# Patient Record
Sex: Female | Born: 1935 | Race: White | Hispanic: No | Smoking: Former smoker
Health system: Southern US, Community
[De-identification: ages and names within clinical notes are randomized; demographics above are authoritative.]

## PROBLEM LIST (undated history)

## (undated) ENCOUNTER — Emergency Department (HOSPITAL_COMMUNITY): Admission: EM | Payer: Medicare Other | Source: Home / Self Care

## (undated) DIAGNOSIS — Z9889 Other specified postprocedural states: Secondary | ICD-10-CM

## (undated) DIAGNOSIS — K760 Fatty (change of) liver, not elsewhere classified: Secondary | ICD-10-CM

## (undated) DIAGNOSIS — K449 Diaphragmatic hernia without obstruction or gangrene: Secondary | ICD-10-CM

## (undated) DIAGNOSIS — M1990 Unspecified osteoarthritis, unspecified site: Secondary | ICD-10-CM

## (undated) DIAGNOSIS — H3530 Unspecified macular degeneration: Secondary | ICD-10-CM

## (undated) DIAGNOSIS — K219 Gastro-esophageal reflux disease without esophagitis: Secondary | ICD-10-CM

## (undated) DIAGNOSIS — R600 Localized edema: Secondary | ICD-10-CM

## (undated) DIAGNOSIS — C801 Malignant (primary) neoplasm, unspecified: Secondary | ICD-10-CM

## (undated) DIAGNOSIS — R112 Nausea with vomiting, unspecified: Secondary | ICD-10-CM

## (undated) DIAGNOSIS — I10 Essential (primary) hypertension: Secondary | ICD-10-CM

## (undated) DIAGNOSIS — K5520 Angiodysplasia of colon without hemorrhage: Secondary | ICD-10-CM

## (undated) DIAGNOSIS — H269 Unspecified cataract: Secondary | ICD-10-CM

## (undated) DIAGNOSIS — I219 Acute myocardial infarction, unspecified: Secondary | ICD-10-CM

## (undated) DIAGNOSIS — K5792 Diverticulitis of intestine, part unspecified, without perforation or abscess without bleeding: Secondary | ICD-10-CM

## (undated) DIAGNOSIS — E559 Vitamin D deficiency, unspecified: Secondary | ICD-10-CM

## (undated) DIAGNOSIS — D509 Iron deficiency anemia, unspecified: Secondary | ICD-10-CM

## (undated) DIAGNOSIS — I8393 Asymptomatic varicose veins of bilateral lower extremities: Secondary | ICD-10-CM

## (undated) DIAGNOSIS — K5521 Angiodysplasia of colon with hemorrhage: Secondary | ICD-10-CM

## (undated) DIAGNOSIS — G459 Transient cerebral ischemic attack, unspecified: Secondary | ICD-10-CM

## (undated) DIAGNOSIS — I809 Phlebitis and thrombophlebitis of unspecified site: Secondary | ICD-10-CM

## (undated) DIAGNOSIS — Z9289 Personal history of other medical treatment: Secondary | ICD-10-CM

## (undated) HISTORY — DX: Iron deficiency anemia, unspecified: D50.9

## (undated) HISTORY — DX: Unspecified osteoarthritis, unspecified site: M19.90

## (undated) HISTORY — PX: CAROTID ARTERY - SUBCLAVIAN ARTERY BYPASS GRAFT: SUR178

## (undated) HISTORY — DX: Localized edema: R60.0

## (undated) HISTORY — PX: HEMORRHOID SURGERY: SHX153

## (undated) HISTORY — DX: Fatty (change of) liver, not elsewhere classified: K76.0

## (undated) HISTORY — DX: Angiodysplasia of colon without hemorrhage: K55.20

## (undated) HISTORY — PX: ABDOMINAL HYSTERECTOMY: SHX81

## (undated) HISTORY — DX: Vitamin D deficiency, unspecified: E55.9

---

## 1997-12-29 ENCOUNTER — Other Ambulatory Visit: Admission: RE | Admit: 1997-12-29 | Discharge: 1997-12-29 | Payer: Self-pay | Admitting: Hematology & Oncology

## 1998-01-27 ENCOUNTER — Other Ambulatory Visit: Admission: RE | Admit: 1998-01-27 | Discharge: 1998-01-27 | Payer: Self-pay | Admitting: Hematology & Oncology

## 1998-03-03 ENCOUNTER — Other Ambulatory Visit: Admission: RE | Admit: 1998-03-03 | Discharge: 1998-03-03 | Payer: Self-pay | Admitting: Hematology & Oncology

## 1998-03-16 ENCOUNTER — Other Ambulatory Visit: Admission: RE | Admit: 1998-03-16 | Discharge: 1998-03-16 | Payer: Self-pay | Admitting: Cardiology

## 1998-03-19 ENCOUNTER — Emergency Department (HOSPITAL_COMMUNITY): Admission: EM | Admit: 1998-03-19 | Discharge: 1998-03-19 | Payer: Self-pay | Admitting: Emergency Medicine

## 1998-04-03 ENCOUNTER — Other Ambulatory Visit: Admission: RE | Admit: 1998-04-03 | Discharge: 1998-04-03 | Payer: Self-pay | Admitting: Internal Medicine

## 1998-05-08 ENCOUNTER — Ambulatory Visit (HOSPITAL_COMMUNITY): Admission: RE | Admit: 1998-05-08 | Discharge: 1998-05-08 | Payer: Self-pay | Admitting: Internal Medicine

## 1998-05-08 ENCOUNTER — Encounter: Payer: Self-pay | Admitting: Internal Medicine

## 1998-08-15 ENCOUNTER — Ambulatory Visit (HOSPITAL_COMMUNITY): Admission: RE | Admit: 1998-08-15 | Discharge: 1998-08-15 | Payer: Self-pay | Admitting: Gastroenterology

## 1998-08-15 ENCOUNTER — Encounter: Payer: Self-pay | Admitting: Gastroenterology

## 1998-11-26 ENCOUNTER — Emergency Department (HOSPITAL_COMMUNITY): Admission: EM | Admit: 1998-11-26 | Discharge: 1998-11-26 | Payer: Self-pay | Admitting: Emergency Medicine

## 1999-01-29 ENCOUNTER — Other Ambulatory Visit: Admission: RE | Admit: 1999-01-29 | Discharge: 1999-01-29 | Payer: Self-pay | Admitting: *Deleted

## 1999-05-10 ENCOUNTER — Ambulatory Visit (HOSPITAL_COMMUNITY): Admission: RE | Admit: 1999-05-10 | Discharge: 1999-05-10 | Payer: Self-pay | Admitting: Internal Medicine

## 1999-05-10 ENCOUNTER — Encounter: Payer: Self-pay | Admitting: Internal Medicine

## 2000-01-30 ENCOUNTER — Other Ambulatory Visit: Admission: RE | Admit: 2000-01-30 | Discharge: 2000-01-30 | Payer: Self-pay | Admitting: *Deleted

## 2000-05-12 ENCOUNTER — Encounter: Payer: Self-pay | Admitting: Internal Medicine

## 2000-05-12 ENCOUNTER — Ambulatory Visit (HOSPITAL_COMMUNITY): Admission: RE | Admit: 2000-05-12 | Discharge: 2000-05-12 | Payer: Self-pay | Admitting: Internal Medicine

## 2001-01-29 ENCOUNTER — Encounter: Payer: Self-pay | Admitting: Gastroenterology

## 2001-01-29 ENCOUNTER — Ambulatory Visit (HOSPITAL_COMMUNITY): Admission: RE | Admit: 2001-01-29 | Discharge: 2001-01-29 | Payer: Self-pay | Admitting: Gastroenterology

## 2001-02-17 ENCOUNTER — Encounter: Payer: Self-pay | Admitting: Emergency Medicine

## 2001-02-17 ENCOUNTER — Emergency Department (HOSPITAL_COMMUNITY): Admission: EM | Admit: 2001-02-17 | Discharge: 2001-02-17 | Payer: Self-pay | Admitting: Emergency Medicine

## 2001-05-18 ENCOUNTER — Ambulatory Visit (HOSPITAL_COMMUNITY): Admission: RE | Admit: 2001-05-18 | Discharge: 2001-05-18 | Payer: Self-pay | Admitting: Internal Medicine

## 2001-05-18 ENCOUNTER — Encounter: Payer: Self-pay | Admitting: Internal Medicine

## 2001-08-17 ENCOUNTER — Ambulatory Visit (HOSPITAL_COMMUNITY): Admission: RE | Admit: 2001-08-17 | Discharge: 2001-08-17 | Payer: Self-pay | Admitting: Internal Medicine

## 2001-08-17 ENCOUNTER — Encounter: Payer: Self-pay | Admitting: Internal Medicine

## 2001-11-02 ENCOUNTER — Encounter: Admission: RE | Admit: 2001-11-02 | Discharge: 2001-11-02 | Payer: Self-pay | Admitting: Internal Medicine

## 2001-11-02 ENCOUNTER — Encounter: Payer: Self-pay | Admitting: Internal Medicine

## 2002-05-27 ENCOUNTER — Encounter: Admission: RE | Admit: 2002-05-27 | Discharge: 2002-05-27 | Payer: Self-pay | Admitting: Internal Medicine

## 2002-05-27 ENCOUNTER — Encounter: Payer: Self-pay | Admitting: Internal Medicine

## 2002-05-28 ENCOUNTER — Encounter: Admission: RE | Admit: 2002-05-28 | Discharge: 2002-05-28 | Payer: Self-pay | Admitting: Internal Medicine

## 2002-05-28 ENCOUNTER — Encounter: Payer: Self-pay | Admitting: Internal Medicine

## 2002-06-11 ENCOUNTER — Ambulatory Visit (HOSPITAL_COMMUNITY): Admission: RE | Admit: 2002-06-11 | Discharge: 2002-06-11 | Payer: Self-pay | Admitting: Gastroenterology

## 2002-06-11 ENCOUNTER — Encounter: Payer: Self-pay | Admitting: Gastroenterology

## 2002-06-30 ENCOUNTER — Encounter: Payer: Self-pay | Admitting: Emergency Medicine

## 2002-06-30 ENCOUNTER — Emergency Department (HOSPITAL_COMMUNITY): Admission: EM | Admit: 2002-06-30 | Discharge: 2002-06-30 | Payer: Self-pay | Admitting: Emergency Medicine

## 2002-10-24 ENCOUNTER — Emergency Department (HOSPITAL_COMMUNITY): Admission: EM | Admit: 2002-10-24 | Discharge: 2002-10-24 | Payer: Self-pay | Admitting: Emergency Medicine

## 2003-06-19 ENCOUNTER — Encounter: Payer: Self-pay | Admitting: Emergency Medicine

## 2003-06-19 ENCOUNTER — Encounter: Payer: Self-pay | Admitting: Internal Medicine

## 2003-06-19 ENCOUNTER — Inpatient Hospital Stay (HOSPITAL_COMMUNITY): Admission: EM | Admit: 2003-06-19 | Discharge: 2003-06-21 | Payer: Self-pay | Admitting: Emergency Medicine

## 2003-06-20 ENCOUNTER — Encounter: Payer: Self-pay | Admitting: Cardiology

## 2003-06-29 ENCOUNTER — Encounter: Payer: Self-pay | Admitting: Internal Medicine

## 2003-06-29 ENCOUNTER — Encounter: Admission: RE | Admit: 2003-06-29 | Discharge: 2003-06-29 | Payer: Self-pay | Admitting: Internal Medicine

## 2003-07-18 ENCOUNTER — Encounter: Payer: Self-pay | Admitting: Gastroenterology

## 2003-07-18 ENCOUNTER — Ambulatory Visit (HOSPITAL_COMMUNITY): Admission: RE | Admit: 2003-07-18 | Discharge: 2003-07-18 | Payer: Self-pay | Admitting: Gastroenterology

## 2003-09-25 ENCOUNTER — Emergency Department (HOSPITAL_COMMUNITY): Admission: AD | Admit: 2003-09-25 | Discharge: 2003-09-25 | Payer: Self-pay | Admitting: Family Medicine

## 2004-01-24 ENCOUNTER — Other Ambulatory Visit: Admission: RE | Admit: 2004-01-24 | Discharge: 2004-01-24 | Payer: Self-pay | Admitting: Obstetrics and Gynecology

## 2004-02-12 ENCOUNTER — Emergency Department (HOSPITAL_COMMUNITY): Admission: EM | Admit: 2004-02-12 | Discharge: 2004-02-12 | Payer: Self-pay | Admitting: Family Medicine

## 2004-07-04 ENCOUNTER — Encounter: Admission: RE | Admit: 2004-07-04 | Discharge: 2004-07-04 | Payer: Self-pay | Admitting: Internal Medicine

## 2004-07-13 ENCOUNTER — Encounter: Admission: RE | Admit: 2004-07-13 | Discharge: 2004-07-13 | Payer: Self-pay | Admitting: Internal Medicine

## 2004-08-19 ENCOUNTER — Inpatient Hospital Stay (HOSPITAL_COMMUNITY): Admission: EM | Admit: 2004-08-19 | Discharge: 2004-08-22 | Payer: Self-pay | Admitting: Emergency Medicine

## 2004-08-29 ENCOUNTER — Ambulatory Visit: Payer: Self-pay | Admitting: Hematology & Oncology

## 2004-11-19 ENCOUNTER — Emergency Department (HOSPITAL_COMMUNITY): Admission: EM | Admit: 2004-11-19 | Discharge: 2004-11-19 | Payer: Self-pay | Admitting: Family Medicine

## 2004-12-06 ENCOUNTER — Ambulatory Visit: Payer: Self-pay | Admitting: Hematology & Oncology

## 2004-12-31 ENCOUNTER — Ambulatory Visit (HOSPITAL_COMMUNITY): Admission: RE | Admit: 2004-12-31 | Discharge: 2004-12-31 | Payer: Self-pay | Admitting: Gastroenterology

## 2004-12-31 ENCOUNTER — Encounter (INDEPENDENT_AMBULATORY_CARE_PROVIDER_SITE_OTHER): Payer: Self-pay | Admitting: Specialist

## 2005-01-28 ENCOUNTER — Ambulatory Visit: Payer: Self-pay | Admitting: Hematology & Oncology

## 2005-04-03 ENCOUNTER — Ambulatory Visit: Payer: Self-pay | Admitting: Hematology & Oncology

## 2005-06-04 ENCOUNTER — Ambulatory Visit: Payer: Self-pay | Admitting: Hematology & Oncology

## 2005-07-15 ENCOUNTER — Encounter: Admission: RE | Admit: 2005-07-15 | Discharge: 2005-07-15 | Payer: Self-pay | Admitting: Internal Medicine

## 2005-07-16 ENCOUNTER — Emergency Department (HOSPITAL_COMMUNITY): Admission: EM | Admit: 2005-07-16 | Discharge: 2005-07-16 | Payer: Self-pay | Admitting: Family Medicine

## 2005-07-30 ENCOUNTER — Ambulatory Visit: Payer: Self-pay | Admitting: Hematology & Oncology

## 2005-10-07 ENCOUNTER — Inpatient Hospital Stay (HOSPITAL_BASED_OUTPATIENT_CLINIC_OR_DEPARTMENT_OTHER): Admission: RE | Admit: 2005-10-07 | Discharge: 2005-10-08 | Payer: Self-pay | Admitting: Cardiology

## 2005-10-29 ENCOUNTER — Ambulatory Visit: Payer: Self-pay | Admitting: Hematology & Oncology

## 2006-01-02 ENCOUNTER — Ambulatory Visit: Payer: Self-pay | Admitting: Hematology & Oncology

## 2006-01-03 LAB — CBC WITH DIFFERENTIAL/PLATELET
Basophils Absolute: 0 10*3/uL (ref 0.0–0.1)
EOS%: 1.8 % (ref 0.0–7.0)
HCT: 44.7 % (ref 34.8–46.6)
HGB: 15.3 g/dL (ref 11.6–15.9)
LYMPH%: 43 % (ref 14.0–48.0)
MCH: 30.3 pg (ref 26.0–34.0)
MCHC: 34.2 g/dL (ref 32.0–36.0)
NEUT%: 42.7 % (ref 39.6–76.8)
Platelets: 279 10*3/uL (ref 145–400)
lymph#: 3.7 10*3/uL — ABNORMAL HIGH (ref 0.9–3.3)

## 2006-01-27 ENCOUNTER — Ambulatory Visit (HOSPITAL_COMMUNITY): Admission: RE | Admit: 2006-01-27 | Discharge: 2006-01-27 | Payer: Self-pay | Admitting: Gastroenterology

## 2006-02-12 ENCOUNTER — Ambulatory Visit: Payer: Self-pay | Admitting: Hematology & Oncology

## 2006-05-09 ENCOUNTER — Ambulatory Visit: Payer: Self-pay | Admitting: Hematology & Oncology

## 2006-05-16 LAB — CBC WITH DIFFERENTIAL/PLATELET
BASO%: 0.4 % (ref 0.0–2.0)
HCT: 44.4 % (ref 34.8–46.6)
LYMPH%: 36.7 % (ref 14.0–48.0)
MCH: 30.4 pg (ref 26.0–34.0)
MCHC: 34.6 g/dL (ref 32.0–36.0)
MONO#: 0.8 10*3/uL (ref 0.1–0.9)
NEUT%: 50.2 % (ref 39.6–76.8)
Platelets: 291 10*3/uL (ref 145–400)
WBC: 7.4 10*3/uL (ref 3.9–10.0)

## 2006-07-16 ENCOUNTER — Encounter: Admission: RE | Admit: 2006-07-16 | Discharge: 2006-07-16 | Payer: Self-pay | Admitting: Internal Medicine

## 2006-08-20 ENCOUNTER — Ambulatory Visit: Payer: Self-pay | Admitting: Hematology & Oncology

## 2006-08-22 LAB — CBC & DIFF AND RETIC
BASO%: 0.4 % (ref 0.0–2.0)
Basophils Absolute: 0 10*3/uL (ref 0.0–0.1)
EOS%: 4.5 % (ref 0.0–7.0)
HCT: 46.3 % (ref 34.8–46.6)
HGB: 15.8 g/dL (ref 11.6–15.9)
IRF: 0.32 (ref 0.130–0.330)
LYMPH%: 35.3 % (ref 14.0–48.0)
MCH: 30.1 pg (ref 26.0–34.0)
MCHC: 34.1 g/dL (ref 32.0–36.0)
MCV: 88.3 fL (ref 81.0–101.0)
MONO%: 10.1 % (ref 0.0–13.0)
NEUT%: 49.7 % (ref 39.6–76.8)
Platelets: 306 10*3/uL (ref 145–400)
lymph#: 3.1 10*3/uL (ref 0.9–3.3)

## 2006-11-18 ENCOUNTER — Ambulatory Visit: Payer: Self-pay | Admitting: Vascular Surgery

## 2006-12-16 ENCOUNTER — Ambulatory Visit: Payer: Self-pay | Admitting: Hematology & Oncology

## 2006-12-19 LAB — CBC WITH DIFFERENTIAL/PLATELET
BASO%: 2.6 % — ABNORMAL HIGH (ref 0.0–2.0)
Basophils Absolute: 0.2 10*3/uL — ABNORMAL HIGH (ref 0.0–0.1)
EOS%: 2.6 % (ref 0.0–7.0)
Eosinophils Absolute: 0.2 10*3/uL (ref 0.0–0.5)
HCT: 41.9 % (ref 34.8–46.6)
HGB: 14.6 g/dL (ref 11.6–15.9)
LYMPH%: 32 % (ref 14.0–48.0)
MCH: 30.4 pg (ref 26.0–34.0)
MCHC: 34.7 g/dL (ref 32.0–36.0)
MCV: 87.4 fL (ref 81.0–101.0)
MONO#: 0.8 10*3/uL (ref 0.1–0.9)
MONO%: 11.5 % (ref 0.0–13.0)
NEUT#: 3.7 10*3/uL (ref 1.5–6.5)
NEUT%: 51.3 % (ref 39.6–76.8)
Platelets: 269 10*3/uL (ref 145–400)
RBC: 4.79 10*6/uL (ref 3.70–5.32)
RDW: 13.1 % (ref 11.3–14.5)
WBC: 7.2 10*3/uL (ref 3.9–10.0)
lymph#: 2.3 10*3/uL (ref 0.9–3.3)

## 2006-12-21 ENCOUNTER — Emergency Department (HOSPITAL_COMMUNITY): Admission: EM | Admit: 2006-12-21 | Discharge: 2006-12-21 | Payer: Self-pay | Admitting: Family Medicine

## 2006-12-28 ENCOUNTER — Emergency Department (HOSPITAL_COMMUNITY): Admission: EM | Admit: 2006-12-28 | Discharge: 2006-12-28 | Payer: Self-pay | Admitting: Family Medicine

## 2006-12-31 ENCOUNTER — Emergency Department (HOSPITAL_COMMUNITY): Admission: EM | Admit: 2006-12-31 | Discharge: 2006-12-31 | Payer: Self-pay | Admitting: Family Medicine

## 2007-01-02 ENCOUNTER — Emergency Department (HOSPITAL_COMMUNITY): Admission: EM | Admit: 2007-01-02 | Discharge: 2007-01-02 | Payer: Self-pay | Admitting: Family Medicine

## 2007-01-05 ENCOUNTER — Emergency Department (HOSPITAL_COMMUNITY): Admission: EM | Admit: 2007-01-05 | Discharge: 2007-01-05 | Payer: Self-pay | Admitting: Emergency Medicine

## 2007-01-12 ENCOUNTER — Emergency Department (HOSPITAL_COMMUNITY): Admission: EM | Admit: 2007-01-12 | Discharge: 2007-01-12 | Payer: Self-pay | Admitting: Emergency Medicine

## 2007-04-14 ENCOUNTER — Ambulatory Visit: Payer: Self-pay | Admitting: Hematology & Oncology

## 2007-04-17 LAB — CBC WITH DIFFERENTIAL/PLATELET
BASO%: 0.5 % (ref 0.0–2.0)
EOS%: 2.4 % (ref 0.0–7.0)
HCT: 44.3 % (ref 34.8–46.6)
LYMPH%: 38.6 % (ref 14.0–48.0)
MCH: 30.1 pg (ref 26.0–34.0)
MCHC: 34.8 g/dL (ref 32.0–36.0)
MONO%: 10.2 % (ref 0.0–13.0)
NEUT%: 48.3 % (ref 39.6–76.8)
Platelets: 283 10*3/uL (ref 145–400)
RBC: 5.12 10*6/uL (ref 3.70–5.32)

## 2007-06-08 ENCOUNTER — Emergency Department (HOSPITAL_COMMUNITY): Admission: EM | Admit: 2007-06-08 | Discharge: 2007-06-08 | Payer: Self-pay | Admitting: Family Medicine

## 2007-06-15 ENCOUNTER — Ambulatory Visit (HOSPITAL_COMMUNITY): Admission: RE | Admit: 2007-06-15 | Discharge: 2007-06-15 | Payer: Self-pay | Admitting: Gastroenterology

## 2007-07-20 ENCOUNTER — Encounter: Admission: RE | Admit: 2007-07-20 | Discharge: 2007-07-20 | Payer: Self-pay | Admitting: Internal Medicine

## 2007-08-07 ENCOUNTER — Ambulatory Visit: Payer: Self-pay | Admitting: Hematology & Oncology

## 2007-08-12 LAB — CBC WITH DIFFERENTIAL/PLATELET
Basophils Absolute: 0 10*3/uL (ref 0.0–0.1)
EOS%: 2.1 % (ref 0.0–7.0)
Eosinophils Absolute: 0.2 10*3/uL (ref 0.0–0.5)
HCT: 42.8 % (ref 34.8–46.6)
HGB: 14.9 g/dL (ref 11.6–15.9)
MCH: 31 pg (ref 26.0–34.0)
MONO#: 0.9 10*3/uL (ref 0.1–0.9)
NEUT#: 4.3 10*3/uL (ref 1.5–6.5)
NEUT%: 51.8 % (ref 39.6–76.8)
RDW: 12.8 % (ref 11.3–14.5)
lymph#: 2.9 10*3/uL (ref 0.9–3.3)

## 2007-08-12 LAB — FERRITIN: Ferritin: 33 ng/mL (ref 10–291)

## 2007-08-28 ENCOUNTER — Ambulatory Visit (HOSPITAL_COMMUNITY): Admission: RE | Admit: 2007-08-28 | Discharge: 2007-08-28 | Payer: Self-pay | Admitting: Gastroenterology

## 2007-11-25 ENCOUNTER — Ambulatory Visit: Payer: Self-pay | Admitting: Hematology & Oncology

## 2007-11-27 LAB — CBC & DIFF AND RETIC
BASO%: 0.5 % (ref 0.0–2.0)
EOS%: 1.9 % (ref 0.0–7.0)
HCT: 39.9 % (ref 34.8–46.6)
LYMPH%: 35.2 % (ref 14.0–48.0)
MCH: 31 pg (ref 26.0–34.0)
MCHC: 35.2 g/dL (ref 32.0–36.0)
MONO#: 0.6 10*3/uL (ref 0.1–0.9)
MONO%: 8.8 % (ref 0.0–13.0)
NEUT%: 53.6 % (ref 39.6–76.8)
Platelets: 262 10*3/uL (ref 145–400)
RBC: 4.52 10*6/uL (ref 3.70–5.32)
Retic %: 1.5 % (ref 0.4–2.3)
WBC: 7.4 10*3/uL (ref 3.9–10.0)

## 2008-02-21 ENCOUNTER — Emergency Department (HOSPITAL_COMMUNITY): Admission: EM | Admit: 2008-02-21 | Discharge: 2008-02-21 | Payer: Self-pay | Admitting: Family Medicine

## 2008-03-29 ENCOUNTER — Ambulatory Visit: Payer: Self-pay | Admitting: Vascular Surgery

## 2008-06-03 ENCOUNTER — Ambulatory Visit: Payer: Self-pay | Admitting: Hematology & Oncology

## 2008-06-06 LAB — RETICULOCYTES (CHCC)
RBC.: 5.07 MIL/uL (ref 3.87–5.11)
Retic Ct Pct: 0.7 % (ref 0.4–3.1)

## 2008-06-06 LAB — CBC WITH DIFFERENTIAL (CANCER CENTER ONLY)
BASO#: 0.1 10*3/uL (ref 0.0–0.2)
Eosinophils Absolute: 0.2 10*3/uL (ref 0.0–0.5)
HCT: 43.6 % (ref 34.8–46.6)
HGB: 15 g/dL (ref 11.6–15.9)
LYMPH%: 41.1 % (ref 14.0–48.0)
MCH: 29.9 pg (ref 26.0–34.0)
MCV: 87 fL (ref 81–101)
MONO#: 0.6 10*3/uL (ref 0.1–0.9)
MONO%: 8.8 % (ref 0.0–13.0)
RBC: 5.04 10*6/uL (ref 3.70–5.32)
WBC: 7.2 10*3/uL (ref 3.9–10.0)

## 2008-06-06 LAB — FERRITIN: Ferritin: 41 ng/mL (ref 10–291)

## 2008-07-21 ENCOUNTER — Encounter: Admission: RE | Admit: 2008-07-21 | Discharge: 2008-07-21 | Payer: Self-pay | Admitting: Internal Medicine

## 2008-07-23 ENCOUNTER — Emergency Department (HOSPITAL_COMMUNITY): Admission: EM | Admit: 2008-07-23 | Discharge: 2008-07-23 | Payer: Self-pay | Admitting: Family Medicine

## 2008-08-22 ENCOUNTER — Ambulatory Visit (HOSPITAL_COMMUNITY): Admission: RE | Admit: 2008-08-22 | Discharge: 2008-08-22 | Payer: Self-pay | Admitting: Gastroenterology

## 2008-12-09 ENCOUNTER — Ambulatory Visit: Payer: Self-pay | Admitting: Hematology & Oncology

## 2008-12-12 LAB — CBC WITH DIFFERENTIAL (CANCER CENTER ONLY)
BASO#: 0 10*3/uL (ref 0.0–0.2)
Eosinophils Absolute: 0.2 10*3/uL (ref 0.0–0.5)
HGB: 15.4 g/dL (ref 11.6–15.9)
LYMPH#: 2.2 10*3/uL (ref 0.9–3.3)
MCH: 30.5 pg (ref 26.0–34.0)
MONO#: 0.4 10*3/uL (ref 0.1–0.9)
NEUT#: 3.3 10*3/uL (ref 1.5–6.5)
Platelets: 237 10*3/uL (ref 145–400)
RBC: 5.05 10*6/uL (ref 3.70–5.32)
WBC: 6.1 10*3/uL (ref 3.9–10.0)

## 2008-12-12 LAB — FERRITIN: Ferritin: 77 ng/mL (ref 10–291)

## 2008-12-12 LAB — CHCC SATELLITE - SMEAR

## 2008-12-12 LAB — RETICULOCYTES (CHCC): ABS Retic: 44.5 10*3/uL (ref 19.0–186.0)

## 2009-03-15 ENCOUNTER — Encounter: Admission: RE | Admit: 2009-03-15 | Discharge: 2009-03-15 | Payer: Self-pay | Admitting: Gastroenterology

## 2009-06-23 HISTORY — PX: COLONOSCOPY: SHX174

## 2009-07-07 ENCOUNTER — Ambulatory Visit: Payer: Self-pay | Admitting: Hematology & Oncology

## 2009-07-10 LAB — CBC WITH DIFFERENTIAL (CANCER CENTER ONLY)
BASO#: 0.1 10*3/uL (ref 0.0–0.2)
BASO%: 0.7 % (ref 0.0–2.0)
EOS%: 2.4 % (ref 0.0–7.0)
HCT: 35.7 % (ref 34.8–46.6)
HGB: 12 g/dL (ref 11.6–15.9)
LYMPH#: 2.8 10*3/uL (ref 0.9–3.3)
LYMPH%: 39.2 % (ref 14.0–48.0)
MCHC: 33.5 g/dL (ref 32.0–36.0)
MCV: 88 fL (ref 81–101)
NEUT%: 48.9 % (ref 39.6–80.0)
RDW: 11.1 % (ref 10.5–14.6)

## 2009-07-10 LAB — CHCC SATELLITE - SMEAR

## 2009-07-10 LAB — RETICULOCYTES (CHCC)
RBC.: 4.11 MIL/uL (ref 3.87–5.11)
Retic Ct Pct: 1.1 % (ref 0.4–3.1)

## 2009-07-10 LAB — FERRITIN: Ferritin: 14 ng/mL (ref 10–291)

## 2009-07-24 ENCOUNTER — Encounter: Admission: RE | Admit: 2009-07-24 | Discharge: 2009-07-24 | Payer: Self-pay | Admitting: Internal Medicine

## 2009-08-10 ENCOUNTER — Ambulatory Visit: Payer: Self-pay | Admitting: Hematology & Oncology

## 2009-08-14 LAB — CBC WITH DIFFERENTIAL (CANCER CENTER ONLY)
BASO#: 0 10*3/uL (ref 0.0–0.2)
BASO%: 0.6 % (ref 0.0–2.0)
Eosinophils Absolute: 0.2 10*3/uL (ref 0.0–0.5)
HCT: 42.4 % (ref 34.8–46.6)
HGB: 14.5 g/dL (ref 11.6–15.9)
LYMPH#: 2.1 10*3/uL (ref 0.9–3.3)
LYMPH%: 35.1 % (ref 14.0–48.0)
MCV: 88 fL (ref 81–101)
MONO#: 0.4 10*3/uL (ref 0.1–0.9)
NEUT%: 54.5 % (ref 39.6–80.0)
RBC: 4.81 10*6/uL (ref 3.70–5.32)
WBC: 6.1 10*3/uL (ref 3.9–10.0)

## 2009-08-14 LAB — RETICULOCYTES (CHCC)
ABS Retic: 19.6 10*3/uL (ref 19.0–186.0)
RBC.: 4.9 MIL/uL (ref 3.87–5.11)
Retic Ct Pct: 0.4 % (ref 0.4–3.1)

## 2009-08-14 LAB — FERRITIN: Ferritin: 271 ng/mL (ref 10–291)

## 2009-10-06 ENCOUNTER — Ambulatory Visit: Payer: Self-pay | Admitting: Hematology & Oncology

## 2009-10-09 LAB — CBC WITH DIFFERENTIAL (CANCER CENTER ONLY)
BASO#: 0.1 10*3/uL (ref 0.0–0.2)
BASO%: 0.7 % (ref 0.0–2.0)
EOS%: 2.9 % (ref 0.0–7.0)
Eosinophils Absolute: 0.2 10*3/uL (ref 0.0–0.5)
HCT: 46.6 % (ref 34.8–46.6)
HGB: 15.6 g/dL (ref 11.6–15.9)
LYMPH#: 2.9 10*3/uL (ref 0.9–3.3)
LYMPH%: 40.8 % (ref 14.0–48.0)
MCH: 30.1 pg (ref 26.0–34.0)
MCHC: 33.6 g/dL (ref 32.0–36.0)
MCV: 90 fL (ref 81–101)
MONO#: 0.6 10*3/uL (ref 0.1–0.9)
MONO%: 7.7 % (ref 0.0–13.0)
NEUT#: 3.4 10*3/uL (ref 1.5–6.5)
NEUT%: 47.9 % (ref 39.6–80.0)
Platelets: 211 10*3/uL (ref 145–400)
RBC: 5.19 10*6/uL (ref 3.70–5.32)
RDW: 12.9 % (ref 10.5–14.6)
WBC: 7.1 10*3/uL (ref 3.9–10.0)

## 2009-10-09 LAB — FERRITIN: Ferritin: 135 ng/mL (ref 10–291)

## 2009-10-09 LAB — RETICULOCYTES (CHCC)
ABS Retic: 25.5 10*3/uL (ref 19.0–186.0)
RBC.: 5.1 MIL/uL (ref 3.87–5.11)
Retic Ct Pct: 0.5 % (ref 0.4–3.1)

## 2010-01-18 ENCOUNTER — Ambulatory Visit: Payer: Self-pay | Admitting: Hematology & Oncology

## 2010-01-22 LAB — CBC WITH DIFFERENTIAL (CANCER CENTER ONLY)
BASO#: 0.1 10*3/uL (ref 0.0–0.2)
BASO%: 1 % (ref 0.0–2.0)
EOS%: 2.2 % (ref 0.0–7.0)
HGB: 15.2 g/dL (ref 11.6–15.9)
LYMPH#: 2.9 10*3/uL (ref 0.9–3.3)
MCHC: 33.4 g/dL (ref 32.0–36.0)
NEUT#: 3.3 10*3/uL (ref 1.5–6.5)
Platelets: 199 10*3/uL (ref 145–400)
RDW: 10.2 % — ABNORMAL LOW (ref 10.5–14.6)

## 2010-01-22 LAB — FERRITIN: Ferritin: 51 ng/mL (ref 10–291)

## 2010-06-01 ENCOUNTER — Ambulatory Visit: Payer: Self-pay | Admitting: Hematology & Oncology

## 2010-06-04 LAB — CBC WITH DIFFERENTIAL (CANCER CENTER ONLY)
Eosinophils Absolute: 0.2 10*3/uL (ref 0.0–0.5)
HCT: 45 % (ref 34.8–46.6)
LYMPH%: 40.2 % (ref 14.0–48.0)
MCV: 93 fL (ref 81–101)
MONO#: 0.7 10*3/uL (ref 0.1–0.9)
Platelets: 175 10*3/uL (ref 145–400)
RBC: 4.82 10*6/uL (ref 3.70–5.32)
WBC: 6.7 10*3/uL (ref 3.9–10.0)

## 2010-06-04 LAB — RETICULOCYTES (CHCC)
ABS Retic: 38.4 10*3/uL (ref 19.0–186.0)
RBC.: 4.8 MIL/uL (ref 3.87–5.11)

## 2010-06-04 LAB — CHCC SATELLITE - SMEAR

## 2010-06-04 LAB — FERRITIN: Ferritin: 141 ng/mL (ref 10–291)

## 2010-08-01 ENCOUNTER — Encounter: Admission: RE | Admit: 2010-08-01 | Discharge: 2010-08-01 | Payer: Self-pay | Admitting: Internal Medicine

## 2010-09-28 ENCOUNTER — Ambulatory Visit: Payer: Self-pay | Admitting: Hematology & Oncology

## 2010-10-01 LAB — CBC WITH DIFFERENTIAL (CANCER CENTER ONLY)
BASO#: 0.1 10*3/uL (ref 0.0–0.2)
BASO%: 1.4 % (ref 0.0–2.0)
EOS%: 2.9 % (ref 0.0–7.0)
Eosinophils Absolute: 0.2 10*3/uL (ref 0.0–0.5)
HCT: 46 % (ref 34.8–46.6)
HGB: 15.6 g/dL (ref 11.6–15.9)
LYMPH#: 2.8 10*3/uL (ref 0.9–3.3)
LYMPH%: 39 % (ref 14.0–48.0)
MCH: 31.3 pg (ref 26.0–34.0)
MCHC: 33.8 g/dL (ref 32.0–36.0)
MCV: 92 fL (ref 81–101)
MONO#: 0.6 10*3/uL (ref 0.1–0.9)
MONO%: 9 % (ref 0.0–13.0)
NEUT#: 3.4 10*3/uL (ref 1.5–6.5)
NEUT%: 47.7 % (ref 39.6–80.0)
Platelets: 209 10*3/uL (ref 145–400)
RBC: 4.98 10*6/uL (ref 3.70–5.32)
RDW: 11 % (ref 10.5–14.6)
WBC: 7.1 10*3/uL (ref 3.9–10.0)

## 2010-10-01 LAB — CHCC SATELLITE - SMEAR

## 2010-10-01 LAB — FERRITIN: Ferritin: 141 ng/mL (ref 10–291)

## 2010-11-08 ENCOUNTER — Emergency Department (HOSPITAL_COMMUNITY)
Admission: EM | Admit: 2010-11-08 | Discharge: 2010-11-08 | Disposition: A | Discharge status: Home / Self Care | Payer: Medicare Other | Attending: Emergency Medicine | Admitting: Emergency Medicine

## 2010-11-08 ENCOUNTER — Emergency Department (HOSPITAL_COMMUNITY): Payer: Medicare Other

## 2010-11-08 DIAGNOSIS — M545 Low back pain, unspecified: Secondary | ICD-10-CM | POA: Insufficient documentation

## 2010-11-08 DIAGNOSIS — I252 Old myocardial infarction: Secondary | ICD-10-CM | POA: Insufficient documentation

## 2010-11-08 DIAGNOSIS — R209 Unspecified disturbances of skin sensation: Secondary | ICD-10-CM | POA: Insufficient documentation

## 2010-11-08 DIAGNOSIS — R5381 Other malaise: Secondary | ICD-10-CM | POA: Insufficient documentation

## 2010-11-08 DIAGNOSIS — Z8673 Personal history of transient ischemic attack (TIA), and cerebral infarction without residual deficits: Secondary | ICD-10-CM | POA: Insufficient documentation

## 2010-11-08 DIAGNOSIS — M549 Dorsalgia, unspecified: Secondary | ICD-10-CM | POA: Insufficient documentation

## 2010-11-08 DIAGNOSIS — I10 Essential (primary) hypertension: Secondary | ICD-10-CM | POA: Insufficient documentation

## 2010-11-08 LAB — DIFFERENTIAL
Basophils Absolute: 0 10*3/uL (ref 0.0–0.1)
Basophils Relative: 0 % (ref 0–1)
Eosinophils Absolute: 0 10*3/uL (ref 0.0–0.7)
Eosinophils Relative: 0 % (ref 0–5)
Lymphocytes Relative: 21 % (ref 12–46)
Lymphs Abs: 1.4 10*3/uL (ref 0.7–4.0)
Monocytes Absolute: 0.3 10*3/uL (ref 0.1–1.0)
Monocytes Relative: 5 % (ref 3–12)
Neutro Abs: 5.1 10*3/uL (ref 1.7–7.7)
Neutrophils Relative %: 74 % (ref 43–77)

## 2010-11-08 LAB — COMPREHENSIVE METABOLIC PANEL
ALT: 64 U/L — ABNORMAL HIGH (ref 0–35)
AST: 56 U/L — ABNORMAL HIGH (ref 0–37)
Albumin: 3.9 g/dL (ref 3.5–5.2)
Alkaline Phosphatase: 120 U/L — ABNORMAL HIGH (ref 39–117)
BUN: 11 mg/dL (ref 6–23)
CO2: 23 mEq/L (ref 19–32)
Calcium: 10 mg/dL (ref 8.4–10.5)
Chloride: 109 mEq/L (ref 96–112)
Creatinine, Ser: 0.77 mg/dL (ref 0.4–1.2)
GFR calc Af Amer: >60 mL/min (ref >60)
GFR calc non Af Amer: >60 mL/min (ref >60)
Glucose, Bld: 144 mg/dL — ABNORMAL HIGH (ref 70–99)
Potassium: 4.3 mEq/L (ref 3.5–5.1)
Sodium: 139 mEq/L (ref 135–145)
Total Bilirubin: 1 mg/dL (ref 0.3–1.2)
Total Protein: 7.8 g/dL (ref 6.0–8.3)

## 2010-11-08 LAB — URINALYSIS, ROUTINE W REFLEX MICROSCOPIC
Hgb urine dipstick: NEGATIVE
Ketones, ur: NEGATIVE mg/dL
Protein, ur: NEGATIVE mg/dL
Urine Glucose, Fasting: NEGATIVE mg/dL

## 2010-11-08 LAB — CBC
HCT: 46.8 % — ABNORMAL HIGH (ref 36.0–46.0)
Hemoglobin: 15.9 g/dL — ABNORMAL HIGH (ref 12.0–15.0)
MCH: 31.3 pg (ref 26.0–34.0)
MCHC: 34 g/dL (ref 30.0–36.0)
MCV: 92.1 fL (ref 78.0–100.0)
Platelets: 197 10*3/uL (ref 150–400)
RBC: 5.08 MIL/uL (ref 3.87–5.11)
RDW: 12.5 % (ref 11.5–15.5)
WBC: 6.9 10*3/uL (ref 4.0–10.5)

## 2010-11-08 LAB — URINE MICROSCOPIC-ADD ON

## 2010-11-08 LAB — CK: Total CK: 83 U/L (ref 7–177)

## 2010-11-08 LAB — SEDIMENTATION RATE: Sed Rate: 1 mm/hr (ref 0–22)

## 2010-12-07 ENCOUNTER — Inpatient Hospital Stay (INDEPENDENT_AMBULATORY_CARE_PROVIDER_SITE_OTHER)
Admission: RE | Admit: 2010-12-07 | Discharge: 2010-12-07 | Disposition: A | Discharge status: Home / Self Care | Payer: Medicare Other | Source: Ambulatory Visit | Attending: Family Medicine | Admitting: Family Medicine

## 2010-12-07 DIAGNOSIS — L02619 Cutaneous abscess of unspecified foot: Secondary | ICD-10-CM

## 2011-01-28 ENCOUNTER — Other Ambulatory Visit: Payer: Self-pay | Admitting: Hematology & Oncology

## 2011-01-28 ENCOUNTER — Encounter (HOSPITAL_BASED_OUTPATIENT_CLINIC_OR_DEPARTMENT_OTHER): Payer: Medicare Other | Admitting: Hematology & Oncology

## 2011-01-28 DIAGNOSIS — K5520 Angiodysplasia of colon without hemorrhage: Secondary | ICD-10-CM

## 2011-01-28 DIAGNOSIS — D509 Iron deficiency anemia, unspecified: Secondary | ICD-10-CM

## 2011-01-28 LAB — CBC WITH DIFFERENTIAL (CANCER CENTER ONLY)
BASO#: 0 10e3/uL (ref 0.0–0.2)
BASO%: 0.1 % (ref 0.0–2.0)
EOS%: 1.9 % (ref 0.0–7.0)
Eosinophils Absolute: 0.2 10e3/uL (ref 0.0–0.5)
HCT: 42.5 % (ref 34.8–46.6)
HGB: 14.9 g/dL (ref 11.6–15.9)
LYMPH#: 3 10e3/uL (ref 0.9–3.3)
LYMPH%: 34 % (ref 14.0–48.0)
MCH: 30.9 pg (ref 26.0–34.0)
MCHC: 35.1 g/dL (ref 32.0–36.0)
MCV: 88 fL (ref 81–101)
MONO#: 1.1 10e3/uL — ABNORMAL HIGH (ref 0.1–0.9)
MONO%: 12.5 % (ref 0.0–13.0)
NEUT#: 4.6 10e3/uL (ref 1.5–6.5)
NEUT%: 51.5 % (ref 39.6–80.0)
Platelets: 211 10e3/uL (ref 145–400)
RBC: 4.82 10e6/uL (ref 3.70–5.32)
RDW: 12.4 % (ref 11.1–15.7)
WBC: 9 10e3/uL (ref 3.9–10.0)

## 2011-01-28 LAB — RETICULOCYTES (CHCC)
RBC.: 4.85 MIL/uL (ref 3.87–5.11)
Retic Ct Pct: 0.8 % (ref 0.4–3.1)

## 2011-01-28 LAB — FERRITIN: Ferritin: 126 ng/mL (ref 10–291)

## 2011-01-28 LAB — TECHNOLOGIST REVIEW CHCC SATELLITE

## 2011-01-28 LAB — CHCC SATELLITE - SMEAR

## 2011-02-05 NOTE — Assessment & Plan Note (Signed)
OFFICE VISIT   [name], [name]  DOB:  May 20, 1928                                       03/29/2008  ZOXWR#:[id number]   The patient has experienced some numbness on the sole of the left foot.  She underwent laser ablation of her right short saphenous vein in  January of 2008, of her left great saphenous vein in September of 2007  for venous insufficiency.  She has done well from that standpoint and  only has noticed the numbness in the left foot over the last week or so.  She has had no trauma to the foot and does not notice any numbness in  the right foot.  She does not describe claudication type symptoms and is  not having any cardiac symptoms.  She does have diabetes mellitus.   PHYSICAL EXAMINATION:  Vital signs:  Blood pressure 160/81, heart rate  65, respirations 14.  Abdomen:  Soft, nontender with no masses.  She has  3+ femoral, popliteal and posterior tibial pulses palpable bilaterally.  There are some scattered varicosities in both legs which are not tense.  There is chronic hyperpigmentation from her chronic venous  insufficiency.  No ulcerations or ischemia is noted.   I suspect that she does have some peripheral neuropathy related to her  diabetes but no evidence of arterial insufficiency that is significant  at this time and I have reassured her regarding these findings.   Quita Skye Hart [place], M.D.  Electronically Signed   JDL/MEDQ  D:  03/29/2008  T:  03/30/2008  Job:  [id number]

## 2011-02-05 NOTE — Op Note (Signed)
[name], TSCHIDA                  ACCOUNT NO.:  [id number]   MEDICAL RECORD NO.:  [id number]          PATIENT TYPE:  AMB   LOCATION:  ENDO                         FACILITY:  [facility]   PHYSICIAN:  [name], M.D.    DATE OF BIRTH:  08-Jan-1928   DATE OF PROCEDURE:  08/22/2008  DATE OF DISCHARGE:                               OPERATIVE REPORT   PROCEDURE:  Esophagogastroduodenoscopy with Savary dilatation.   INDICATION:  The patient with long history of hiatal hernia and  stricture helped by dilatation in the past.  Consent was signed after  risks, benefits, methods, and options thoroughly discussed multiple  times in the past.   MEDICINES USED:  1. Fentanyl 75 mcg.  2. Versed 7.5 mg.   PROCEDURE:  The videoendoscope was inserted by direct vision.  Proximal  midesophagus was normal at 30 cm.  A fibrous ring was seen.  With very  gentle pressure, minimal resistance, we were able to advance into the  large hiatal hernia pouch and into the stomach and advanced through the  antrum pertinent for some minimal linear enteritis through a normal  pylorus into a normal duodenal bulb and around the C-loop to normal  second portion of the duodenum.  Scope was slowly withdrawn back to the  bulb again confirming its normal appearance.  Scope was withdrawn back  to the stomach and retroflexed.  High in the cardia, the hiatal hernia  was confirmed.  She did have some Cameron lesions, which had some linear  erosions in the bottom of the hiatal hernia pouch.  No other abnormality  was seen on the retroflexion.  Straight visualization of the stomach did  not reveal any additional findings.  We went ahead and slowly withdrew  back to about 16 cm again confirming the above findings.  The patient  had significant bleeding at GE junction.  The scope was then advanced to  the antrum and under fluoro guidance, a Savary wire was advanced.  The  customary J-loop was confirmed under fluoro and  endoscopically.  The  scope was  removed making sure to keep the wire in the proper position.  We went ahead and advanced the 12.8 and the 15-mm dilators.  Both were  advanced into the stomach without resistance.  There was no heme on  either dilator.  Unfortunately, as we were withdrawing the 15, the wire  withdrew back into the esophagus.  We went ahead and reendoscoped the  patient.  There was no significant trauma or active bleeding at the  hiatal hernia and the stricture.  The scope was reinserted into the  antrum, and the wire was readvanced under fluoro guidance.  The  customary J-loop was confirmed.  The scope was removed to make sure to  keep the wire in the proper position.  We went ahead and proceeded with  a 17-mm dilator, which passed into the stomach without resistance.  The  wire was withdrawn back into the dilator, both were removed in tandem.  There was just a tiny bit of heme on this dilator.  The  procedure was  terminated at this juncture.  The patient tolerated the procedure well.  There was no obvious immediate complication.   ENDOSCOPIC DIAGNOSES:  1. Large hiatal hernia with proximal fibrous ring.  2. Minimal resistance with passing the scope.  3. Minimal enteritis.  4. 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  5. Otherwise normal esophagogastroduodenoscopy, therapy Savary      dilation under fluoro to 17 mm.   PLAN:  See how the dilation works.  If she believes the balloons work  better, we can do that next time.  I will see her back p.r.n. or in 6  weeks.  Double omeprazole for 1 week.  Slowly advance diet.  No aspirin,  nonsteroidal for 3 days.           ______________________________  [name], M.D.     MEM/MEDQ  D:  08/22/2008  T:  08/22/2008  Job:  [id number]   cc:   Peter M. [place], M.D.  Lucky Cowboy, M.D.

## 2011-02-05 NOTE — Op Note (Signed)
[name], [name]                  ACCOUNT NO.:  [id number]   MEDICAL RECORD NO.:  [id number]          PATIENT TYPE:  AMB   LOCATION:  ENDO                         FACILITY:  [facility]   PHYSICIAN:  [name], M.D.    DATE OF BIRTH:  07/04/1928   DATE OF PROCEDURE:  06/15/2007  DATE OF DISCHARGE:                               OPERATIVE REPORT   PROCEDURE:  EGD with Savary dilatation.   ENDOSCOPIST:  [name], M.D.   INDICATIONS FOR PROCEDURE:  Patient with large hiatal hernia, recurrent  ring helped by dilation in the past.  Consent was signed after risks,  benefits, methods, and options were thoroughly discussed in the office  on multiple occasions.   MEDICATIONS:  Fentanyl 70 mcg, Versed 7.5 mg.   DESCRIPTION OF PROCEDURE:  The video endoscope was inserted by direct  vision.  The proximal and mid esophagus were normal.  In the distal  esophagus there was a large hiatal hernia and a moderately tight fibrous  ring. There was some trauma and minimal resistance in advancing the  scope and a little bit of heme was seen in traumatizing the ring with  the scope.  A hiatal hernia pouch was normal.  The scope was inserted  into a normal antrum, normal pylorus, into a normal duodenal bulb and  around the C-loop to a normal second portion of the duodenum.  The scope  was withdrawn back to the bulb and a good look there ruled out  abnormalities in that location.  The scope was withdrawn back to the  stomach and retroflexed.  The hiatal hernia pouch, cardia, fundus,  angularis, lesser and greater curve were all normal under retroflexed  visualization.  Straight visualization of the stomach did not reveal any  additional findings.  We went ahead and slowly withdrew back to about 20  cm confirming above findings.  There was no further any active bleeding  on the ring.  The scope was reinserted to the antrum and under  fluoroscopic guidance a Savary wire was advanced.  Customary J-loop  was  confirmed under fluoroscopy and endoscopically.  The scope was removed  making sure to keep the wire in the proper position and in succession  the Savary 12, 14, and 15 mm dilators were all advanced into the stomach  and confirmed in the proper position under fluoroscopy.  She was waking  up near the end of the dilators, was having a little bit of discomfort,  and we elected to stop the procedure after the 15 was advanced into the  stomach.  There was no heme or any resistance in passing any of the  dilators.  The wire was withdrawn back into the 15, both were removed in  tandem.  The patient tolerated the procedure well.  There was no obvious  immediate complications.   ENDOSCOPIC ASSESSMENT:  1. Large hiatal hernia with a moderately tight fibrous ring with some      trauma with passing the scope.  2. Otherwise normal esophagogastroduodenoscopy.   THERAPY:  Savary dilatation to 15 mm under  fluoroscopy without any  resistance of heme, but stopped secondary to the patient waking up in  some discomfort.   PLAN:  See how the dilation goes.  Continue pump inhibitors.  Possibly  be more aggressive with next dilation.  We will follow up p.r.n. or in  six weeks.           ______________________________  [name], M.D.     MEM/MEDQ  D:  06/15/2007  T:  06/15/2007  Job:  [id number]   cc:   Lucky Cowboy, M.D.  Fax: [phone]   Peter M. [place], M.D.  Fax: [phone]   Rose Phi. Myna Hidalgo, M.D.  Fax: [phone]

## 2011-02-05 NOTE — Op Note (Signed)
[name], [name]                  ACCOUNT NO.:  [id number]   MEDICAL RECORD NO.:  [id number]          PATIENT TYPE:  AMB   LOCATION:  ENDO                         FACILITY:  [facility]   PHYSICIAN:  [name], M.D.    DATE OF BIRTH:  1928/01/16   DATE OF PROCEDURE:  08/28/2007  DATE OF DISCHARGE:                               OPERATIVE REPORT   PROCEDURE:  Esophagogastroduodenoscopy with balloon dilatation.   INDICATIONS:  Patient with dysphagia, history of ring, did not tolerate  her last Savary very well.   Consent signed after risks, benefits, methods, options thoroughly  discussed multiple times in the past.   MEDICINES USED:  Fentanyl 50 micrograms, Versed 5 mg.   DESCRIPTION OF PROCEDURE:  The video endoscope was inserted via direct  vision.  Proximally, the esophagus was normal.  She did have a fibrous  ring. with very minimal resistance, could pass the scope through it.  She did have a moderately large hiatal hernia, unchanged from previous  exams.  Scope passed into the stomach through a normal antrum, normal  pylorus, into a normal duodenal bulb and around the celiac to a normal  second portion of the duodenum.  Scope was withdrawn back to the bulb  and a good look there ruled out abnormalities at that location.  The  scope was withdrawn back to the stomach and retroflexed.  Angularis was  normal.  High in the cardia, the hiatal hernia was confirmed.  Fundus,  lesser and greater curve were normal on retroflexed visualization.  Straight visualization of the stomach did not reveal any additional  findings.  The scope was then slowly withdrawn back to about 20 cm,  which confirmed the above findings.  Scope was then readvanced into the  stomach.   The 8 cm, 15-18 ingestible balloon was advanced into the stomach and  then slowly withdrawn back into the proper position in the esophagus  endoscopically.  The balloon was first inflated to the 15 mm mark and  held for 30  seconds.  We then inflated it to the 16.5 mm mark and held  that for 30 seconds and then finally to the 18 mm mark and held that for  a minute.  After the 18 was held for a minute, the balloon was deflated.  There was nice fracturing of the ring with a little bit of heme.  The  head of her bed was elevated.  The area was washed and suctioned.  There  was no active bleeding at the end of the procedure.  We elected not to  be any more aggressive, since the ring seemed to be adequately  fractured.  The airway was suctioned, scope removed.   The patient tolerated the procedure well.  There was no obvious  immediate complication.   ENDOSCOPIC DIAGNOSES:  1. Moderately large hiatal hernia.  2. Proximal fibrous ring, just above the hiatal hernia.  Passed scope      with minimal resistance.  3. Otherwise normal EGD therapy, balloon dilatation to 18 mm with an      _________cm  adjustable balloon with minimal heme and good      fracturing of the ring.   PLAN:  See how the dilation works.  Happy to see back p.r.n. or in two  months.           ______________________________  [name], M.D.     MEM/MEDQ  D:  08/28/2007  T:  08/28/2007  Job:  [id number]   cc:   Lucky Cowboy, M.D.  Fax: [phone]

## 2011-02-08 NOTE — Cardiovascular Report (Signed)
NAMELILLEIGH, [name]                  ACCOUNT NO.:  [id number]   MEDICAL RECORD NO.:  [id number]          PATIENT TYPE:  OIB   LOCATION:  1965                         FACILITY:  [facility]   PHYSICIAN:  Peter M. [place], M.D.  DATE OF BIRTH:  26-Jun-1928   DATE OF PROCEDURE:  10/08/2005  DATE OF DISCHARGE:                              CARDIAC CATHETERIZATION   INDICATIONS FOR PROCEDURE:  This is a 77 year old white female with a  history of diabetes mellitus type 2, diet-controlled, hypertension,  hypercholesterolemia who presents with increasing symptoms of exertional  chest pain over a two day period.   PROCEDURE:  Left heart catheterization, coronary left ventricular  angiography and equipment, 4-French 4 cm right-left Judkins catheter, 4-  French pigtail catheter, 4-French arterial sheath, access via the right  femoral artery using standard Seldinger technique.   MEDICATIONS:  Local anesthesia 1% Xylocaine, Versed 2 mg, IV contrast 100 mL  of Omnipaque.   HEMODYNAMIC DATA:  Aortic pressure is 142/64 with a mean of 97 mmHg.  Left  ventricular pressure is 142 with EDP of 20 mmHg.   DATA:  1.  Angiographic data:  Left coronary arises and distributes normally. Left      main coronary is normal.  2.  The left anterior descending artery has some mild irregularities in the      mid to distal vessel less than 20%.  3.  There is a large intermediate vessel which bifurcates.  It appears      normal.  4.  The left circumflex comprises a single marginal vessel which has minor      irregularities less than 10%.  5.  The right coronary rises and distributes normally.  It is a dominant      vessel.  It has minor irregularities less than 10%.  The PDA and      posterolateral branches appear patent.  6.  The left ventricular angiography was performed in RAO view.  This      demonstrates normal left ventricular size and contractility with normal      systolic function.  Ejection fraction is estimated  at 65%.  There is no      mitral regurgitation or prolapse.  The aortic root appears normal in      size.   FINAL INTERPRETATION:  1.  No significant obstructive atherosclerotic coronary disease.  2.  Normal left ventricular function.   PLAN:  Would recommend further workup for noncardiac causes of chest pain.           ______________________________  Peter M. [place], M.D.    PMJ/MEDQ  D:  10/08/2005  T:  10/08/2005  Job:  [id number]   cc:   Lucky Cowboy, M.D.  Fax: [phone]

## 2011-02-08 NOTE — Procedures (Signed)
[facility]  Patient:    LAI, HENDRIKS                         MRN: [id number] Proc. Date: 01/29/01 Adm. Date:  [id number] Attending:  Nelda Marseille CC:         Peter M. [place], M.D.  Marinus Maw, M.D.   Procedure Report  PROCEDURE:  Esophagogastroduodenoscopy with Savary dilatation.  SURGEON:  [name], M.D.  INDICATIONS:  Recurrent dysphagia in a patient with known hiatal hernia and ring in the past.  Consent was signed after risks, benefits, methods, and options were thoroughly discussed multiple times in the past.  MEDICINES USED:  Demerol 100 and Versed 9.  DESCRIPTION OF PROCEDURE:  The video endoscope was inserted by direct vision. The proximal and mid esophagus were normal.  At about 30 cm, benign-appearing fibrous ring was seen.  The scope passed easily into a large hiatal hernia and pouch, and advanced to a stomach through a normal antrum, normal pylorus, into a normal duodenal bulb, and around the cilia to a normal second portion of the duodenum.  The scope was withdrawn back to the bulb, and a good look there ruled out abnormalities in that location.  The scope was withdrawn back to the stomach and retroflexed.  High in the cardia, the hiatal hernia was confirmed. The fundus, angularis, lesser and greater curve were all normal.  The scope was straightened and straight visualization of the stomach was normal.  The scope was slowly withdrawn back to 20 cm which confirmed the above esophageal findings.  The scope was reinserted into the antrum and under fluoroscopic guidance, the Savary wire was advanced.  Customary J-loop was confirmed under fluoroscopy, and the scope was removed under fluoroscopic guidance, and then succession of a 12, 8, 14, and 16 mm Savary dilators were all advanced over the wire.  There was no resistance to passing all the dilators.  All were confirmed in the proper position in the stomach.  Once the  16 was passed into the stomach, the wire was withdrawn back in the dilator.  Both were removed in tandem.  There was no resistance in passing any dilators and a trace of heme on the 16 only, but not on the 12, 8, or the 14.  The procedure was terminated with removing the dilator and wire.  There was no obvious or immediate complication.  ENDOSCOPIC DIAGNOSES: 1. Large hiatal hernia with a benign-appearing fibrous proximal ring. 2. Otherwise normal esophagogastroduodenoscopy.  THERAPY:  Savary dilatation to 60 mm under fluoroscopy.  PLAN:  Slowly advance diet, continue Prilosec, and follow up p.r.n. or in six to eight weeks to recheck symptoms to make sure no further more aggressive dilations are needed, and based on her not needing any dilations since 1999, could continue present dose, but could increase pump inhibitors if need be p.r.n. DD:  01/29/01 TD:  01/30/01 Job: [id number]/[id number]

## 2011-02-08 NOTE — H&P (Signed)
[name], [name] NO.:  [id number]   MEDICAL RECORD NO.:  [id number]           PATIENT TYPE:   LOCATION:                                 FACILITY:   PHYSICIAN:  Peter M. [place], M.D.  DATE OF BIRTH:  [birth year]   DATE OF ADMISSION:  DATE OF DISCHARGE:                                HISTORY & PHYSICAL   HISTORY OF PRESENT ILLNESS:  [name] is a pleasant 77 year old white  female who has a history of longstanding hypertension and diabetes mellitus,  type 2.  She has prior history of coronary artery disease.  She initially  underwent a cardiac catheterization in January 1998 which demonstrated no  significant obstructive coronary disease.  However, she returned in November  1998 and suffered a small inferior myocardial infarction.  Cardiac  catheterization at that time demonstrated total occlusion of the second  posterolateral branch of the distal right coronary artery.  Left ventricular  function was felt to be good.  The patient was treated medically and has  done very well since that time.  She returns at this time with new onset of  anginal symptoms.  This Saturday, she went for a walk and after 1 block, she  developed substernal chest tightness and states she could not get a deep  breath.  She felt unsteady on her feet and complained of increasing  shortness of breath.  She went back to her house to lie down and states she  felt very weak for approximately an hour.  These symptoms subsequently  abated, and she had no further chest discomfort that day, but on Sunday when  she again went out to take a walk, her symptoms recurred, and she just went  back inside.  Again, she had generalized weakness and chest tightness.  Because of these new onset anginal symptoms, it is recommended she undergo  cardiac catheterization. Her ECG today is normal.   Prior to this, the patient states she has been very diligent about her  exercise, walking 45 minutes a day for  the last 3 years.  She has been  slowed in December when she developed superficial phlebitis in her left  lower extremity which she apparently has had repeatedly over the years.  She  treated this with heat and elevation and states that the phlebitis has  essentially resolved.  She has had a number of venous Doppler studies over  the years, the last being approximately 1 years ago.  She has no known  history of DVT.   PAST MEDICAL HISTORY:  1.  Diabetes mellitus, type 2.  2.  History of coronary artery disease as noted above.  3.  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  4.  GERD.  5.  Hypertension.  6.  Old inferior myocardial infarction.  7.  History of irritable bowel syndrome.  8.  Colon polyps.  9.  Diverticulosis.  10. Recurrent superficial thrombophlebitis.  11. History of anemia followed by Dr. Myna Waters, iron deficiency.   CURRENT MEDICATIONS:  1.  Atenolol 100 mg per  day.  2.  Benicar 20 mg per day.  3.  Prilosec 20 mg per day.  4.  Bumex p.r.n.  5.  Lescol 20 mg per day.  6.  Alprazolam 0.5 mg p.r.n.  7.  Imdur was prescribed today but has not yet been filled.   ALLERGIES:  SULFA, IV INTERFERON, AVELOX, CIPRO, ZOCOR, BAYCOL.   FAMILY HISTORY:  The patient has a sister who has been diagnosed with colon  cancer.  She has another sister who is in good health.  Father died at age  84 with myocardial infarction.   SOCIAL HISTORY:  The patient is widowed.  She has four children. She quit  smoking in 1991.  She has no history of alcohol use.   REVIEW OF SYSTEMS:  Negative except for insomnia.   PHYSICAL EXAMINATION:  GENERAL: The patient is a pleasant white female in no  distress.  VITAL SIGNS:  Weight 208.  Blood pressure 128/80, pulse 80 and regular,  respirations normal.  HEENT: Normocephalic and atraumatic. Pupils equal, round, and reactive to  light and accommodation.  Sclerae are clear.  Oropharynx is clear.  NECK: Supple without JVD,  adenopathy, thyromegaly, or bruits.  LUNGS:  Clear.  CARDIAC: Regular rate and rhythm. Normal S1 and S2 without gallop, murmur,  rub, or click.  ABDOMEN: Soft and nontender without mass or hepatosplenomegaly.  EXTREMITIES:  Lower extremities reveal moderately diffuse superficial venous  varicosities without active inflammation.  There is no tenderness, and she  has negative Homan's sign.  NEUROLOGIC: Nonfocal.  Mental status is normal.  SKIN:  Warm and dry.   LABORATORY DATA:  ECG is normal.   Most recent blood test in December showed a normal CBC.  Hemoglobin was 15.  Sodium 143, potassium 4.3, BUN 10, creatinine 0.8, glucose 118.  Total  cholesterol 142, triglycerides 127, HDL 35, and LDL 82.  A1c was 6.2%.  CK  was 107.   Chest x-ray shows no active disease.   IMPRESSION:  1.  History of coronary artery disease with remote small inferior myocardial      infarction, now with crescendo angina.  2.  Diabetes mellitus, type 2.  3.  Hypertension.  4.  Dyslipidemia.  5.  History of recurrent superficial thrombophlebitis.  6.  Prior transient ischemic attack.  7.  Gastroesophageal reflux disease.  8.  Irritable bowel syndrome.   PLAN:  Will proceed with diagnostic cardiac catheterization with further  therapy pending these results.           ______________________________  Peter M. [place], M.D.     PMJ/MEDQ  D:  10/07/2005  T:  10/07/2005  Job:  [id number]   cc:   [name], M.D.  Fax: [phone]   [name], M.D.  Fax: [phone]

## 2011-02-08 NOTE — Discharge Summary (Signed)
[name], [name]                  ACCOUNT NO.:  [id number]   MEDICAL RECORD NO.:  [id number]          PATIENT TYPE:  INP   LOCATION:  0368                         FACILITY:  [facility]   PHYSICIAN:  Michaelyn Barter, M.D. DATE OF BIRTH:  07/20/1928   DATE OF ADMISSION:  08/19/2004  DATE OF DISCHARGE:  08/22/2004                                 DISCHARGE SUMMARY   FINAL DIAGNOSES:  1.  Gastroenteritis.  2.  Hypokalemia.  3.  Hypoglycemia.  4.  Nausea and emesis.   PRIMARY CARE PHYSICIAN:  Dr. Lucky Cowboy.   GASTROENTEROLOGIST:  Dr. [name].   HISTORY:  The patient is a 77 year old female with a past medical history of  coronary artery disease, hypertension and hyperlipidemia who was admitted  into the hospital with a chief complaint nausea, vomiting, diarrhea and  abdominal cramps for 2 days.  She stated that 2 days prior to this admission  she developed abdominal cramps followed by watery diarrhea, nausea and  emesis.  She vomited up to 8-10 times since then and approximated her bowel  movement frequency as 15-20 times since the onset of symptoms.  The night  prior to admission she became diaphoretic and developed tremors.  She felt  weak and dizzy.  Subsequently she came to the emergency department.  In the  ER her glucose was found to be 27 at which time she received IV dextrose  50%.  She denied having any fevers and at that time no blood was found to be  in her stool nor did she complaining of hematemesis, no hematochezia.  She  stated that prior to the initiation of her symptoms she had [place]  sandwiches for lunch which were purchased at a nearby store.  She [place]  [place], [place].  None of  her friends were sick, however.   PAST MEDICAL HISTORY:  1.  Coronary artery disease.  2.  Myocardial infarction in 1998, Dr. Peter Swaziland is the cardiologist.  3.  Right subclavian steal syndrome, status post surgery by Dr. Liliane Bade.  4.  Hypertension.  5.  Hyperlipidemia.  6.  Gastroesophageal reflux disease.  7.  History of mild diverticulitis.  8.  Bilateral varicose veins.  9.  Chronic anemia followed by Dr. Myna Hidalgo.  10. Irritable bowel syndrome.  11. Fibrocystic breast disease.  12. Esophageal stricture.  13. Dizziness, stroke, dysequilibrium syndrome.  14. Transient ischemic attack with residual right upper extremity weakness.   PAST SURGICAL HISTORY:  1.  Right breast biopsy in 1950.  2.  Hemorrhoidectomy in 1957.  3.  C-section in 1963.  4.  Vaginal hysterectomy 1978.  5.  Patient reported her last colonoscopy was in January 2002.  Barium enema      in 1999.  She was discovered to have diverticular disease with 2 polyps,      and again Dr. Ewing Schlein follows her for this.   ALLERGIES:  CIPROFLOXACIN.  AVELOX.  SULFA.   SOCIAL HISTORY:  The patient stopped smoking in 1991.  She smoked for  several  years prior to that.  Alcohol the patient denies.   FAMILY HISTORY:  Father died from MI.  Mother died during childbirth.   HOSPITAL COURSE:  Problem 1:  Diarrhea, nausea and vomiting.  The patient  was admitted into the hospital with a presumed diagnosis of gastroenteritis  which was believed to more than likely be viral in origin.  She was started  on Phenergan for the nausea and vomiting.  She received Dilaudid for the  pain and Kaopectate tablets.  Over the course of the hospitalization the  patient's diarrhea quickly resolved, and by the second day of her  hospitalization she stated that she had not had any bowel movement, and  likewise she stated that her nausea and emesis had also gone away.  By the  second day of her admission she started asking when would she be able to go  home and made it very clear that she wanted to be discharged within a day or  two because she had a check that she was expecting in the mail, and she  wanted to be able to go home to receive that.  On the third day of the   patient's admission she indicated that she did have very, very dark stool  earlier in the morning which she described as being almost black in color or  even black in color.  This was the first time she had ever mentioned that.  Upon further evaluation, her hemoglobin was checked, and it was very stable  at 13.8.  Likewise over the course of her hospitalization, her hemoglobin  remained stable.  After reviewing her medications, it was discovered that  she was on bismuth, and it was concluded that more than likely the episode  of black stool that the patient complained of was secondary to the  medications that she was on.  Therefore, no additional workup had taken  place.  Likewise following the mentioning of that black stool on the third  day of admission, the fourth day the patient was not noted to complain of  any black stools.  Likewise after speaking with the nurses, they also  indicated that they had not seen a black stool, so the patient was just  conservatively watched and likewise her hemoglobin and hematocrit were also  watched.  By the time of discharge her symptoms of nausea, vomiting and the  diarrhea had completely resolved, and on the day of discharge, she did not  complain of any abdominal pain either.   Problem 2:  Hypoglycemia.  The patient's hypoglycemia resolved over the  course of her hospitalization excluding the episode that she had at the time  of admission.  There were no reoccurring episodes of hypoglycemia.  The  patient's appetite did return, and she was consuming full meals without  recurring episodes of nausea and vomiting which more than likely had  precipitated the initial hypoglycemia event that the patient presented with.   Problem 3:  Hypokalemia.  Over the course of the patient's hospitalization,  she was noted to have a drop in her potassium level.  By the second day of admission her potassium had declined to 3.2 for which she had  supplementation.   Likewise by the following day it declined to 3.1.  She was  again given supplementation.  On the day of discharge her potassium was 3.4,  and she did receive supplement at that particular time also.  Again, at the  time of discharge, the patient's condition was  significantly improved from  that on admission.   DISCHARGE MEDICATIONS:  The patient was discharged home on the following  medications.  1.  Benicar 20 mg 1 tablet p.o. q.d.  2.  Prilosec 20 mg 1 tablet q.d.  3.  Atenolol 100 mg 1 tablet q.d.  4.  Phenergan 12.5 mg 1 tablet q.8h. p.r.n. for nausea and emesis.  5.  Lescol.  The patient should resume her previously prescribed dose.  6.  Imodium.  The patient was instructed to take as directed p.r.n. for      diarrhea.  7.  Potassium 20 mEq 1 p.o. once a day for 7 days.   She was instructed to consume a low-salt diet and to follow up with her  primary care physician, Dr. Oneta Rack on December 8 at 9:30 a.m.     Orla   OR/MEDQ  D:  09/02/2004  T:  09/02/2004  Job:  [id number]   cc:   Lucky Cowboy, M.D.  [address]  [place], [place] [address]  Fax: [phone]   [name], M.D.  [address]  [place]  [place] [address]  Fax: [phone]

## 2011-06-25 LAB — GLUCOSE, CAPILLARY: Glucose-Capillary: 104 mg/dL — ABNORMAL HIGH (ref 70–99)

## 2011-07-02 ENCOUNTER — Encounter: Payer: Self-pay | Admitting: *Deleted

## 2011-07-04 LAB — I-STAT 8, (EC8 V) (CONVERTED LAB)
BUN: 12
Chloride: 106
Glucose, Bld: 116 — ABNORMAL HIGH
HCT: 52 — ABNORMAL HIGH
Hemoglobin: 17.7 — ABNORMAL HIGH
Operator id: 235561
Potassium: 3.8
Sodium: 141

## 2011-07-04 LAB — BASIC METABOLIC PANEL
BUN: 10
CO2: 25
GFR calc non Af Amer: 59 — ABNORMAL LOW
Glucose, Bld: 120 — ABNORMAL HIGH
Potassium: 3.7
Sodium: 139

## 2011-07-08 ENCOUNTER — Other Ambulatory Visit: Payer: Self-pay | Admitting: Internal Medicine

## 2011-07-08 DIAGNOSIS — Z1231 Encounter for screening mammogram for malignant neoplasm of breast: Secondary | ICD-10-CM

## 2011-07-29 ENCOUNTER — Telehealth: Payer: Self-pay | Admitting: Hematology & Oncology
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
## 2011-08-02 ENCOUNTER — Ambulatory Visit: Payer: Medicare Other | Admitting: Hematology & Oncology

## 2011-08-02 ENCOUNTER — Other Ambulatory Visit: Payer: Medicare Other | Admitting: Lab

## 2011-08-07 ENCOUNTER — Other Ambulatory Visit: Payer: Self-pay | Admitting: Hematology & Oncology

## 2011-08-07 ENCOUNTER — Other Ambulatory Visit (HOSPITAL_BASED_OUTPATIENT_CLINIC_OR_DEPARTMENT_OTHER): Payer: Medicare Other | Admitting: Lab

## 2011-08-07 ENCOUNTER — Encounter: Payer: Self-pay | Admitting: Hematology & Oncology

## 2011-08-07 ENCOUNTER — Ambulatory Visit (HOSPITAL_BASED_OUTPATIENT_CLINIC_OR_DEPARTMENT_OTHER): Payer: Medicare Other | Admitting: Hematology & Oncology

## 2011-08-07 DIAGNOSIS — D509 Iron deficiency anemia, unspecified: Secondary | ICD-10-CM

## 2011-08-07 DIAGNOSIS — K5520 Angiodysplasia of colon without hemorrhage: Secondary | ICD-10-CM

## 2011-08-07 HISTORY — DX: Iron deficiency anemia, unspecified: D50.9

## 2011-08-07 HISTORY — DX: Angiodysplasia of colon without hemorrhage: K55.20

## 2011-08-07 LAB — CBC WITH DIFFERENTIAL (CANCER CENTER ONLY)
BASO%: 0.2 % (ref 0.0–2.0)
EOS%: 2 % (ref 0.0–7.0)
HCT: 42.2 % (ref 34.8–46.6)
LYMPH%: 35.9 % (ref 14.0–48.0)
MCHC: 34.6 g/dL (ref 32.0–36.0)
MCV: 92 fL (ref 81–101)
MONO#: 0.5 10*3/uL (ref 0.1–0.9)
NEUT%: 52.8 % (ref 39.6–80.0)
RDW: 12.8 % (ref 11.1–15.7)

## 2011-08-07 LAB — CHCC SATELLITE - SMEAR

## 2011-08-07 LAB — IRON AND TIBC
%SAT: 48 % (ref 20–55)
TIBC: 360 ug/dL (ref 250–470)

## 2011-08-07 LAB — RETICULOCYTES (CHCC)
ABS Retic: 37.1 10*3/uL (ref 19.0–186.0)
Retic Ct Pct: 0.8 % (ref 0.4–2.3)

## 2011-08-07 LAB — FERRITIN: Ferritin: 157 ng/mL (ref 10–291)

## 2011-08-07 NOTE — Progress Notes (Signed)
CC:   Lucky Cowboy, M.D. Runell Kovich M. [place], M.D. [name], M.D.  DIAGNOSES: 1. Iron deficiency anemia. 2. Angiodysplasia.  CURRENT THERAPY:  Observation.  INTERIM HISTORY:  [name] comes in for 38-month followup.  She is doing okay.  Her back is giving her a lot of problems right now.  She sees Dr. Ophelia Charter of orthopedic surgery for this.  It does not sound like she is going to need surgery.  She said that she had some x-rays which did not show any problems outside of arthritis.  She has had no other issues.  There is no melena or bright red blood per rectum.  Her appetite has been good.  She is not a vegetarian.  PHYSICAL EXAMINATION:  General:  This is a well-developed well-nourished white female in no obvious distress.  Vital Signs:  Temperature of 96.6, pulse 76, respiratory rate 18, blood pressure 153/81.  Weight is 195. Head/Neck:  Exam shows a normocephalic, atraumatic skull.  There are no ocular or oral lesions.  There are no palpable cervical or supraclavicular lymph nodes.  Lungs:  Clear bilaterally.  Cardiac: Regular rate and rhythm with normal S1, S2.  There are no murmurs, rubs or bruits.  Abdomen:  Soft with good bowel sounds.  There is no palpable abdominal mass.  No fluid wave.  No palpable hepatosplenomegaly.  Back: No tenderness over the spine, ribs, or hips.  Extremities:  No clubbing, cyanosis or edema.  Neurologic:  No focal neurological deficits.  LABORATORY STUDIES:  White cell count is 5, hemoglobin 14.6, hematocrit 42.2, platelet count 148.  IMPRESSION:  [name] is an 77 year old white female with gastrointestinal angiodysplasias.  She occasionally has GI bleeding. She sees Dr. Ewing Schlein regarding this.  She also has esophageal stricture.  PLAN:  For now, I do not see that we are going to have to do anything with her.  I think we can just follow up in 6 months as we have always done.  I do not see that we need to have blood work done until she  comes back to see [place].   ______________________________ Josph Macho, M.D. PRE/MEDQ  D:  08/07/2011  T:  08/07/2011  Job:  [id number]

## 2011-08-07 NOTE — Progress Notes (Signed)
This office note has been dictated. MRN: [id number] Female, 77 y.o.  CSN: [id number]

## 2011-08-10 LAB — TRANSFERRIN RECEPTOR, SOLUABLE: Transferrin Receptor, Soluble: 1.25 mg/L (ref 0.76–1.76)

## 2011-08-20 ENCOUNTER — Ambulatory Visit
Admission: RE | Admit: 2011-08-20 | Discharge: 2011-08-20 | Disposition: A | Discharge status: Home / Self Care | Payer: Medicare Other | Source: Ambulatory Visit | Attending: Internal Medicine | Admitting: Internal Medicine

## 2011-08-20 DIAGNOSIS — Z1231 Encounter for screening mammogram for malignant neoplasm of breast: Secondary | ICD-10-CM

## 2011-12-06 ENCOUNTER — Emergency Department (HOSPITAL_COMMUNITY): Payer: Medicare Other

## 2011-12-06 ENCOUNTER — Observation Stay (HOSPITAL_COMMUNITY)
Admission: EM | Admit: 2011-12-06 | Discharge: 2011-12-07 | Disposition: A | Discharge status: Home / Self Care | Payer: Medicare Other | Attending: Internal Medicine | Admitting: Internal Medicine

## 2011-12-06 ENCOUNTER — Other Ambulatory Visit: Payer: Self-pay

## 2011-12-06 ENCOUNTER — Encounter (HOSPITAL_COMMUNITY): Payer: Self-pay

## 2011-12-06 DIAGNOSIS — R51 Headache: Secondary | ICD-10-CM | POA: Insufficient documentation

## 2011-12-06 DIAGNOSIS — H9190 Unspecified hearing loss, unspecified ear: Secondary | ICD-10-CM | POA: Insufficient documentation

## 2011-12-06 DIAGNOSIS — Z87891 Personal history of nicotine dependence: Secondary | ICD-10-CM | POA: Insufficient documentation

## 2011-12-06 DIAGNOSIS — R7309 Other abnormal glucose: Secondary | ICD-10-CM | POA: Diagnosis present

## 2011-12-06 DIAGNOSIS — H3530 Unspecified macular degeneration: Secondary | ICD-10-CM | POA: Diagnosis present

## 2011-12-06 DIAGNOSIS — I809 Phlebitis and thrombophlebitis of unspecified site: Secondary | ICD-10-CM | POA: Diagnosis present

## 2011-12-06 DIAGNOSIS — G459 Transient cerebral ischemic attack, unspecified: Principal | ICD-10-CM | POA: Diagnosis present

## 2011-12-06 DIAGNOSIS — I10 Essential (primary) hypertension: Secondary | ICD-10-CM | POA: Diagnosis present

## 2011-12-06 DIAGNOSIS — K5520 Angiodysplasia of colon without hemorrhage: Secondary | ICD-10-CM | POA: Diagnosis present

## 2011-12-06 DIAGNOSIS — K219 Gastro-esophageal reflux disease without esophagitis: Secondary | ICD-10-CM | POA: Insufficient documentation

## 2011-12-06 DIAGNOSIS — I2510 Atherosclerotic heart disease of native coronary artery without angina pectoris: Secondary | ICD-10-CM | POA: Diagnosis present

## 2011-12-06 DIAGNOSIS — D509 Iron deficiency anemia, unspecified: Secondary | ICD-10-CM | POA: Diagnosis present

## 2011-12-06 DIAGNOSIS — R279 Unspecified lack of coordination: Secondary | ICD-10-CM | POA: Insufficient documentation

## 2011-12-06 DIAGNOSIS — E119 Type 2 diabetes mellitus without complications: Secondary | ICD-10-CM | POA: Insufficient documentation

## 2011-12-06 DIAGNOSIS — I803 Phlebitis and thrombophlebitis of lower extremities, unspecified: Secondary | ICD-10-CM | POA: Insufficient documentation

## 2011-12-06 DIAGNOSIS — E785 Hyperlipidemia, unspecified: Secondary | ICD-10-CM | POA: Diagnosis present

## 2011-12-06 DIAGNOSIS — R42 Dizziness and giddiness: Secondary | ICD-10-CM

## 2011-12-06 DIAGNOSIS — Z79899 Other long term (current) drug therapy: Secondary | ICD-10-CM | POA: Insufficient documentation

## 2011-12-06 HISTORY — DX: Essential (primary) hypertension: I10

## 2011-12-06 HISTORY — DX: Diverticulitis of intestine, part unspecified, without perforation or abscess without bleeding: K57.92

## 2011-12-06 HISTORY — DX: Unspecified macular degeneration: H35.30

## 2011-12-06 HISTORY — DX: Phlebitis and thrombophlebitis of unspecified site: I80.9

## 2011-12-06 HISTORY — DX: Unspecified osteoarthritis, unspecified site: M19.90

## 2011-12-06 LAB — URINALYSIS, ROUTINE W REFLEX MICROSCOPIC
Hgb urine dipstick: NEGATIVE
Leukocytes, UA: NEGATIVE
Nitrite: NEGATIVE
Specific Gravity, Urine: 1.005 (ref 1.005–1.030)
Urobilinogen, UA: 0.2 mg/dL (ref 0.0–1.0)

## 2011-12-06 LAB — DIFFERENTIAL
Eosinophils Absolute: 0.5 10*3/uL (ref 0.0–0.7)
Lymphs Abs: 2.9 10*3/uL (ref 0.7–4.0)
Monocytes Relative: 12 % (ref 3–12)
Neutro Abs: 3 10*3/uL (ref 1.7–7.7)
Neutrophils Relative %: 42 % — ABNORMAL LOW (ref 43–77)

## 2011-12-06 LAB — CBC
Hemoglobin: 15.3 g/dL — ABNORMAL HIGH (ref 12.0–15.0)
Platelets: 192 10*3/uL (ref 150–400)
RBC: 5 MIL/uL (ref 3.87–5.11)
WBC: 7.2 10*3/uL (ref 4.0–10.5)

## 2011-12-06 LAB — COMPREHENSIVE METABOLIC PANEL
AST: 47 U/L — ABNORMAL HIGH (ref 0–37)
CO2: 28 mEq/L (ref 19–32)
Calcium: 10.3 mg/dL (ref 8.4–10.5)
Chloride: 107 mEq/L (ref 96–112)
Creatinine, Ser: 0.9 mg/dL (ref 0.50–1.10)
GFR calc non Af Amer: 58 mL/min — ABNORMAL LOW (ref >90)
Potassium: 3.9 mEq/L (ref 3.5–5.1)
Sodium: 142 mEq/L (ref 135–145)

## 2011-12-06 LAB — CARDIAC PANEL(CRET KIN+CKTOT+MB+TROPI): CK, MB: 6.7 ng/mL (ref 0.3–4.0)

## 2011-12-06 LAB — PROTIME-INR
INR: 1.02 (ref 0.00–1.49)
Prothrombin Time: 13.6 seconds (ref 11.6–15.2)

## 2011-12-06 NOTE — ED Notes (Signed)
Pt sates weakness/fatigue.  Had a pain/dizziness to RT side of head when she stood up today-"unlike any feeling I've had before" and was bumping into things.  This was around 4:30pm today.  No obvious neuro deficits noted in triage.  Grips  Equal, no facial droop, axox3.

## 2011-12-06 NOTE — ED Provider Notes (Signed)
History     CSN: [id number]  Arrival date & time 12/06/11  5621   First MD Initiated Contact with Patient 12/06/11 1943      Chief Complaint  Patient presents with  . Dizziness  . Weakness    (Consider location/radiation/quality/duration/timing/severity/associated sxs/prior treatment) HPI Pt has been generally weak for several days and took a nap shortly before 1630 today. She awoke and states she was lightheaded at the time with a strange feeling to the right side of her head. She states she was uncoordinated and had trouble walking. She sat and 20 min later the symptoms resolved. Pt states she still feels generally weak w/o focality. No head trauma, CP, SOB, abd, N/V/D, urinary symptoms.   Past Medical History  Diagnosis Date  . Anemia, iron deficiency 08/07/2011  . Angiodysplasia of intestinal tract 08/07/2011  . Diabetes mellitus   . Hypertension   . Arthritis   . Hx of gastroesophageal reflux (GERD)   . Diverticulitis   . Phlebitis   . Macular degeneration     Past Surgical History  Procedure Date  . Carotid artery - subclavian artery bypass graft     History reviewed. No pertinent family history.  History  Substance Use Topics  . Smoking status: Former Games developer  . Smokeless tobacco: Not on file  . Alcohol Use: No    OB History    Grav Para Term Preterm Abortions TAB SAB Ect Mult Living                  Review of Systems  Constitutional: Positive for fatigue. Negative for fever and chills.  HENT: Negative for neck pain and neck stiffness.   Respiratory: Negative for chest tightness, shortness of breath and wheezing.   Cardiovascular: Positive for leg swelling. Negative for chest pain and palpitations.  Gastrointestinal: Negative for nausea, vomiting, abdominal pain and diarrhea.  Genitourinary: Negative for dysuria, frequency and flank pain.  Musculoskeletal: Negative for back pain.  Skin: Negative for color change, pallor and rash.  Neurological:  Positive for dizziness, light-headedness and headaches. Negative for weakness and numbness.    Allergies  Moxifloxacin; Sulfa antibiotics; and Ciprofloxacin  Home Medications   Current Outpatient Rx  Name Route Sig Dispense Refill  . ALPRAZOLAM 0.25 MG PO TABS Oral Take 0.25 mg by mouth at bedtime as needed. ANXIETY.    Marland Kitchen ATENOLOL 100 MG PO TABS Oral Take 100 mg by mouth daily.      . BUMETANIDE 0.5 MG PO TABS Oral Take 0.5 mg by mouth continuous as needed.     Marland Kitchen VITAMIN D 1000 UNITS PO TABS Oral Take 1,000 Units by mouth daily.    . CYCLOBENZAPRINE HCL 10 MG PO TABS Oral Take 10 mg by mouth 2 (two) times daily as needed. MUSCLE SPASM.    Marland Kitchen CENTRUM PO CHEW Oral Chew 1 tablet by mouth daily.      Marland Kitchen NITROSTAT 0.4 MG SL SUBL Sublingual Place 0.4 mg under the tongue.     Marland Kitchen OMEPRAZOLE 40 MG PO CPDR Oral Take 40 mg by mouth daily.      Marland Kitchen OLMESARTAN MEDOXOMIL 20 MG PO TABS Oral Take 20 mg by mouth daily.        BP 175/94  Pulse 98  Temp(Src) 98.3 F (36.8 C) (Oral)  Resp 16  SpO2 100%  Physical Exam  Nursing note and vitals reviewed. Constitutional: She is oriented to person, place, and time. She appears well-developed and well-nourished. No distress.  HENT:  Head: Normocephalic and atraumatic.  Mouth/Throat: Oropharynx is clear and moist.  Eyes: Conjunctivae and EOM are normal. Pupils are equal, round, and reactive to light.  Neck: Normal range of motion. Neck supple.  Cardiovascular: Normal rate and regular rhythm.   Pulmonary/Chest: Effort normal and breath sounds normal. No respiratory distress. She has no wheezes. She has no rales.  Abdominal: Soft. Bowel sounds are normal. There is no tenderness. There is no rebound and no guarding.  Musculoskeletal: Normal range of motion. She exhibits edema (R>L lower ext swelling with obvious superficaial phelbitis. ). She exhibits no tenderness.  Neurological: She is alert and oriented to person, place, and time.       5/5 motor in all  ext, sensation intact  Skin: Skin is warm and dry. No rash noted. No erythema.  Psychiatric: She has a normal mood and affect. Her behavior is normal.    ED Course  Procedures (including critical care time)   Labs Reviewed  CBC  DIFFERENTIAL  COMPREHENSIVE METABOLIC PANEL  URINALYSIS, ROUTINE W REFLEX MICROSCOPIC  PROTIME-INR  APTT  CARDIAC PANEL(CRET KIN+CKTOT+MB+TROPI)  PRO B NATRIURETIC PEPTIDE   No results found.   No diagnosis found.   Date: 12/06/2011  Rate: 84  Rhythm: normal sinus rhythm  QRS Axis: normal  Intervals: normal  ST/T Wave abnormalities: normal  Conduction Disutrbances:none  Narrative Interpretation:   Old EKG Reviewed: changes noted    MDM   Triad to see in ED. Will discuss with neurology       Loren Racer, MD 12/06/11 2306
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
## 2011-12-07 ENCOUNTER — Observation Stay (HOSPITAL_COMMUNITY): Payer: Medicare Other

## 2011-12-07 ENCOUNTER — Encounter (HOSPITAL_COMMUNITY): Payer: Self-pay | Admitting: Family Medicine

## 2011-12-07 DIAGNOSIS — H3530 Unspecified macular degeneration: Secondary | ICD-10-CM | POA: Diagnosis present

## 2011-12-07 DIAGNOSIS — I809 Phlebitis and thrombophlebitis of unspecified site: Secondary | ICD-10-CM | POA: Diagnosis present

## 2011-12-07 DIAGNOSIS — G459 Transient cerebral ischemic attack, unspecified: Secondary | ICD-10-CM | POA: Diagnosis present

## 2011-12-07 DIAGNOSIS — I10 Essential (primary) hypertension: Secondary | ICD-10-CM | POA: Diagnosis present

## 2011-12-07 DIAGNOSIS — I2510 Atherosclerotic heart disease of native coronary artery without angina pectoris: Secondary | ICD-10-CM | POA: Diagnosis present

## 2011-12-07 DIAGNOSIS — I369 Nonrheumatic tricuspid valve disorder, unspecified: Secondary | ICD-10-CM

## 2011-12-07 DIAGNOSIS — E785 Hyperlipidemia, unspecified: Secondary | ICD-10-CM | POA: Diagnosis present

## 2011-12-07 DIAGNOSIS — R7309 Other abnormal glucose: Secondary | ICD-10-CM | POA: Diagnosis present

## 2011-12-07 LAB — LIPID PANEL
LDL Cholesterol: 94 mg/dL (ref 0–99)
Total CHOL/HDL Ratio: 4.7 RATIO
VLDL: 24 mg/dL (ref 0–40)

## 2011-12-07 LAB — BASIC METABOLIC PANEL
CO2: 25 mEq/L (ref 19–32)
Chloride: 110 mEq/L (ref 96–112)
Glucose, Bld: 110 mg/dL — ABNORMAL HIGH (ref 70–99)
Potassium: 3.9 mEq/L (ref 3.5–5.1)
Sodium: 142 mEq/L (ref 135–145)

## 2011-12-07 LAB — GLUCOSE, CAPILLARY
Glucose-Capillary: 134 mg/dL — ABNORMAL HIGH (ref 70–99)
Glucose-Capillary: 97 mg/dL (ref 70–99)

## 2011-12-07 LAB — CBC
Hemoglobin: 13.9 g/dL (ref 12.0–15.0)
MCH: 30.7 pg (ref 26.0–34.0)
MCV: 89.4 fL (ref 78.0–100.0)
Platelets: 161 10*3/uL (ref 150–400)
RBC: 4.53 MIL/uL (ref 3.87–5.11)
WBC: 6.7 10*3/uL (ref 4.0–10.5)

## 2011-12-07 LAB — HEMOGLOBIN A1C: Mean Plasma Glucose: 120 mg/dL — ABNORMAL HIGH (ref <117)

## 2011-12-07 LAB — RAPID URINE DRUG SCREEN, HOSP PERFORMED
Cocaine: NOT DETECTED
Opiates: NOT DETECTED

## 2011-12-07 MED ORDER — IRBESARTAN 75 MG PO TABS
75.0000 mg | ORAL_TABLET | Freq: Every day | ORAL | Status: DC
  Administered 2011-12-07: 75 mg via ORAL
  Filled 2011-12-07: qty 1

## 2011-12-07 MED ORDER — ATENOLOL 100 MG PO TABS
100.0000 mg | ORAL_TABLET | Freq: Every day | ORAL | Status: DC
  Administered 2011-12-07: 100 mg via ORAL
  Filled 2011-12-07: qty 1

## 2011-12-07 MED ORDER — PANTOPRAZOLE SODIUM 40 MG PO TBEC
40.0000 mg | DELAYED_RELEASE_TABLET | Freq: Every day | ORAL | Status: DC
  Administered 2011-12-07: 40 mg via ORAL
  Filled 2011-12-07: qty 1

## 2011-12-07 MED ORDER — POTASSIUM CHLORIDE IN NACL 20-0.9 MEQ/L-% IV SOLN
INTRAVENOUS | Status: DC
  Administered 2011-12-07: 05:00:00 via INTRAVENOUS
  Filled 2011-12-07 (×3): qty 1000

## 2011-12-07 MED ORDER — ASPIRIN 325 MG PO TABS: 325.0000 mg | ORAL_TABLET | Freq: Every day | ORAL | Status: DC

## 2011-12-07 MED ORDER — ASPIRIN 325 MG PO TABS
325.0000 mg | ORAL_TABLET | Freq: Every day | ORAL | Status: DC
  Administered 2011-12-07: 325 mg via ORAL
  Filled 2011-12-07: qty 1

## 2011-12-07 MED ORDER — ONDANSETRON HCL 4 MG PO TABS: 4.0000 mg | ORAL_TABLET | Freq: Four times a day (QID) | ORAL | Status: DC | PRN

## 2011-12-07 MED ORDER — ONDANSETRON HCL 4 MG/2ML IJ SOLN: 4.0000 mg | Freq: Four times a day (QID) | INTRAMUSCULAR | Status: DC | PRN

## 2011-12-07 MED ORDER — NITROGLYCERIN 0.4 MG SL SUBL: 0.4000 mg | SUBLINGUAL_TABLET | SUBLINGUAL | Status: DC | PRN

## 2011-12-07 MED ORDER — ENOXAPARIN SODIUM 40 MG/0.4ML ~~LOC~~ SOLN
40.0000 mg | SUBCUTANEOUS | Status: DC
  Administered 2011-12-07: 40 mg via SUBCUTANEOUS
  Filled 2011-12-07: qty 0.4

## 2011-12-07 NOTE — Progress Notes (Signed)
VASCULAR LAB PRELIMINARY  PRELIMINARY  PRELIMINARY  PRELIMINARY  Carotid Dopplers completed.    Preliminary report:  Bilateral: no significant ICA stenosis.  Vertebral artery flow is antegrade.  [name], 12/07/2011, 10:32 AM

## 2011-12-07 NOTE — H&P (Signed)
PCP:   [facility] E, MD, MD   Chief Complaint:  Incoordination  HPI: This 78 year old female who lives alone, she states she's not been feeling well all week. Today she had a nap, woke up and headed to the bathroom, when suddenly was unable to walk straight. She states she had to grab onto the doors and knobs to prevent herself from falling down. She reports a very strange feeling in the right side of her head, not a headache but more of a throbbing. She denies any blurred vision, no falls, no tinnitus, no nausea, no vomiting, no altered mental status or any localized weakness. Patient say she is hypertensive but her blood pressures normally well controlled. She was concerned and came to the ER. By the time patient arrived to the ER all her symptoms have resolved. History provided the patient who is alert and oriented  Review of Systems: Positives bolded   anorexia, fever, weight loss,, vision loss, decreased hearing, hoarseness, chest pain, syncope, dyspnea on exertion, peripheral edema, balance deficits, hemoptysis, abdominal pain, melena, hematochezia, severe indigestion/heartburn, hematuria, incontinence, genital sores, muscle weakness, suspicious skin lesions, transient blindness, difficulty walking, depression, unusual weight change, abnormal bleeding, enlarged lymph nodes, angioedema, and breast masses.  Past Medical History: Past Medical History  Diagnosis Date  . Anemia, iron deficiency 08/07/2011  . Angiodysplasia of intestinal tract 08/07/2011  . Diabetes mellitus   . Hypertension   . Arthritis   . Hx of gastroesophageal reflux (GERD)   . Diverticulitis   . Phlebitis   . Macular degeneration    Past Surgical History  Procedure Date  . Carotid artery - subclavian artery bypass graft   . Abdominal hysterectomy   . Hemorrhoid surgery     Medications: Prior to Admission medications   Medication Sig Start Date End Date Taking? Authorizing Provider  ALPRAZolam (XANAX) 0.25 MG  tablet Take 0.25 mg by mouth at bedtime as needed. ANXIETY.   Yes Historical Provider, MD  atenolol (TENORMIN) 100 MG tablet Take 100 mg by mouth daily.     Yes Historical Provider, MD  bumetanide (BUMEX) 0.5 MG tablet Take 0.5 mg by mouth continuous as needed.    Yes Historical Provider, MD  cholecalciferol (VITAMIN D) 1000 UNITS tablet Take 1,000 Units by mouth daily.   Yes Historical Provider, MD  cyclobenzaprine (FLEXERIL) 10 MG tablet Take 10 mg by mouth 2 (two) times daily as needed. MUSCLE SPASM.   Yes Eldred Manges, MD  multivitamin-iron-minerals-folic acid (CENTRUM) chewable tablet Chew 1 tablet by mouth daily.     Yes Historical Provider, MD  NITROSTAT 0.4 MG SL tablet Place 0.4 mg under the tongue.  07/05/11  Yes Historical Provider, MD  omeprazole (PRILOSEC) 40 MG capsule Take 40 mg by mouth daily.     Yes Historical Provider, MD  olmesartan (BENICAR) 20 MG tablet Take 20 mg by mouth daily.      Historical Provider, MD    Allergies:   Allergies  Allergen Reactions  . Moxifloxacin   . Sulfa Antibiotics   . Ciprofloxacin Other (See Comments)    Complains of sore mouth    Social History:  reports that she has quit smoking. She does not have any smokeless tobacco history on file. She reports that she does not drink alcohol or use illicit drugs.  Family History: Family History  Problem Relation Age of Onset  . Colon cancer    . Coronary artery disease      Physical Exam: Filed Vitals:  12/06/11 2012 12/06/11 2013 12/06/11 2014 12/06/11 2216  BP: 175/94 183/85 182/98 147/62  Pulse: 98 101 108 94  Temp:      TempSrc:      Resp:      SpO2:    97%    General:  Alert and oriented times three, well developed and nourished, no acute distress Eyes: PERRLA, pink conjunctiva, no scleral icterus ENT: Moist oral mucosa, neck supple, no thyromegaly Lungs: clear to ascultation, no wheeze, no crackles, no use of accessory muscles Cardiovascular: regular rate and rhythm, no  regurgitation, no gallops, no murmurs. No carotid bruits, no JVD Abdomen: soft, positive BS, non-tender, non-distended, no organomegaly, not an acute abdomen GU: not examined Neuro: CN II - XII grossly intact, sensation intact Musculoskeletal: strength 5/5 all extremities, no clubbing, cyanosis or edema Skin: no rash, no subcutaneous crepitation, no decubitus Psych: appropriate patient   Labs on Admission:   [facility] 12/06/11 2000  NA 142  K 3.9  CL 107  CO2 28  GLUCOSE 104*  BUN 10  CREATININE 0.90  CALCIUM 10.3  MG --  PHOS --    Basename 12/06/11 2000  AST 47*  ALT 30  ALKPHOS 153*  BILITOT 0.5  PROT 7.7  ALBUMIN 3.6   No results found for this basename: LIPASE:2,AMYLASE:2 in the last 72 hours  Basename 12/06/11 2000  WBC 7.2  NEUTROABS 3.0  HGB 15.3*  HCT 45.4  MCV 90.8  PLT 192    Basename 12/06/11 2000  CKTOTAL 141  CKMB 6.7*  CKMBINDEX --  TROPONINI <0.30   No components found with this basename: POCBNP:3 No results found for this basename: DDIMER:2 in the last 72 hours No results found for this basename: HGBA1C:2 in the last 72 hours No results found for this basename: CHOL:2,HDL:2,LDLCALC:2,TRIG:2,CHOLHDL:2,LDLDIRECT:2 in the last 72 hours No results found for this basename: TSH,T4TOTAL,FREET3,T3FREE,THYROIDAB in the last 72 hours No results found for this basename: VITAMINB12:2,FOLATE:2,FERRITIN:2,TIBC:2,IRON:2,RETICCTPCT:2 in the last 72 hours  Micro Results: No results found for this or any previous visit (from the past 240 hour(s)). Results for CHAYA, DEHAAN (MRN [id number]) as of 12/07/2011 00:38  Ref. Range 12/06/2011 22:06  Color, Urine Latest Range: YELLOW  COLORLESS (A)  APPearance Latest Range: CLEAR  CLEAR  Specific Gravity, Urine Latest Range: 1.005-1.030  1.005  pH Latest Range: 5.0-8.0  6.5  Glucose, UA Latest Range: NEGATIVE mg/dL NEGATIVE  Bilirubin Urine Latest Range: NEGATIVE  NEGATIVE  Ketones, ur Latest Range: NEGATIVE mg/dL  NEGATIVE  Protein Latest Range: NEGATIVE mg/dL NEGATIVE  Urobilinogen, UA Latest Range: 0.0-1.0 mg/dL 0.2  Nitrite Latest Range: NEGATIVE  NEGATIVE  Leukocytes, UA Latest Range: NEGATIVE  NEGATIVE  Hgb urine dipstick Latest Range: NEGATIVE  NEGATIVE    Radiological Exams on Admission: Ct Head Wo Contrast  12/06/2011  *RADIOLOGY REPORT*  Clinical Data: Dizziness,  CT HEAD WITHOUT CONTRAST  Technique:  Contiguous axial images were obtained from the base of the skull through the vertex without contrast.  Comparison: None.  Findings: No acute intracranial hemorrhage.  No focal mass lesion. No CT evidence of acute infarction.   No midline shift or mass effect.  No hydrocephalus.  Basilar cisterns are patent. No acute intracranial hemorrhage.  No focal mass lesion.  No CT evidence of acute infarction.   No midline shift or mass effect.  No hydrocephalus.  Basilar cisterns are patent.  IMPRESSION: No acute intracranial findings.  Original Report Authenticated By: Genevive Bi, M.D.   Dg Chest [facility]  12/06/2011  *RADIOLOGY REPORT*  Clinical Data: Generalized weakness  PORTABLE CHEST - 1 VIEW  Comparison: Thoracic spine films 02/16 1012  Findings: Normal mediastinum and heart silhouette.  There is bronchitic markings centrally.  No effusion, infiltrate, or pneumothorax.  IMPRESSION:  1.  No acute findings. 2.  Chronic bronchitic markings.  Original Report Authenticated By: Genevive Bi, M.D.    EKG normal sinus rhythm  Assessment/Plan Present on Admission:  .TIA (transient ischemic attack) Admit to telemetry TIA workup including aspirin, lipid panel in a.m., carotid ultrasounds, and swallow evaluation Monitor in telemetry, neurology consulted by ER physician  .Anemia, iron deficiency .Angiodysplasia of intestinal tract Hypertension Diabetes mellitus Coronary artery disease Macular degeneration Stable resume home medications, ADA diet says that incident Thrombophlebitis the posterior  right knee  Continue aspirin and warm soaks  DO NOT RESUSCITATE DVT prophylaxis T7/Dr. Truddie Coco, Ramone Gander 12/07/2011, 12:37 AM

## 2011-12-07 NOTE — Consult Note (Signed)
Spoke with Lia from MRI who will investigate the compatibility of patient's stent for MRI. Please follow Dr. Roselyn Meier plan and I will follow with you should there be any changes in the plan.   Carmell Austria, MD

## 2011-12-07 NOTE — Consult Note (Signed)
[name] is an 78 y.o. female.   Chief Complaint:  HPI: This is a 78 year old female with history of dm and htn who presents with complaints of gait instability. She does state she experienced generalized weakness for over one week which is of unclear etiology. Patient reported that shortly after awakening from a nap she headed to the bathroom and was suddenly unable to walk straight. She states she had to grab onto the doors and knobs to prevent herself from falling down. She complaints of dizziness which she describes as feeling of lightheadedness. She notes an associated throbbing pain involving the right side of her head. She denies any blurred vision or double vision, nausea, vomiting, focal weakness, numbness, tingling, facial droop.   Past Medical History  Diagnosis Date  . Anemia, iron deficiency 08/07/2011  . Angiodysplasia of intestinal tract 08/07/2011  . Diabetes mellitus   . Hypertension   . Arthritis   . Hx of gastroesophageal reflux (GERD)   . Diverticulitis   . Phlebitis   . Macular degeneration     Past Surgical History  Procedure Date  . Carotid artery - subclavian artery bypass graft   . Abdominal hysterectomy   . Hemorrhoid surgery     Family History  Problem Relation Age of Onset  . Colon cancer    . Coronary artery disease     Social History:  reports that she has quit smoking. She does not have any smokeless tobacco history on file. She reports that she does not drink alcohol or use illicit drugs.  Allergies:  Allergies  Allergen Reactions  . Moxifloxacin   . Sulfa Antibiotics   . Ciprofloxacin Other (See Comments)    Complains of sore mouth    No current facility-administered medications on file as of 12/06/2011.   Medications Prior to Admission  Medication Sig Dispense Refill  . ALPRAZolam (XANAX) 0.25 MG tablet Take 0.25 mg by mouth at bedtime as needed. ANXIETY.      Marland Kitchen atenolol (TENORMIN) 100 MG tablet Take 100 mg by mouth daily.        .  bumetanide (BUMEX) 0.5 MG tablet Take 0.5 mg by mouth continuous as needed.       . cyclobenzaprine (FLEXERIL) 10 MG tablet Take 10 mg by mouth 2 (two) times daily as needed. MUSCLE SPASM.      Marland Kitchen multivitamin-iron-minerals-folic acid (CENTRUM) chewable tablet Chew 1 tablet by mouth daily.        Marland Kitchen NITROSTAT 0.4 MG SL tablet Place 0.4 mg under the tongue.       Marland Kitchen omeprazole (PRILOSEC) 40 MG capsule Take 40 mg by mouth daily.        Marland Kitchen olmesartan (BENICAR) 20 MG tablet Take 20 mg by mouth daily.           ROS: All systems reviewed and are negative except as noted above  Blood pressure 120/94, pulse 93, temperature 98.3 F (36.8 C), temperature source Oral, resp. rate 18, SpO2 95.00%.    General Examination: Heart: RRR Lungs: CTAB   Neuro exam: MS: Alert and oriented times three, speech fluent, able to follow simple and complex commands, able to name, repeat, and comprehend CN: PERRLA, EOMI, VFF, Intact facial sensation in V1-V3 distributions, face symmetric, hearing grossly intact, symmetric rise of palate and uvula, symmetric shoulder shrug, tongue midline Motor: 5/5 throughout with exception of 4+/5 in lle which is old per patient Reflexes: 1+ and symmetric Sensation: intact to lt Coordination: intact fnf  Gait: routine gait intact  Results for orders placed during the hospital encounter of 12/06/11 (from the past 48 hour(s))  CBC     Status: Abnormal   Collection Time   12/06/11  8:00 PM      Component Value Range Comment   WBC 7.2  4.0 - 10.5 (K/uL)    RBC 5.00  3.87 - 5.11 (MIL/uL)    Hemoglobin 15.3 (*) 12.0 - 15.0 (g/dL)    HCT 16.1  09.6 - 04.5 (%)    MCV 90.8  78.0 - 100.0 (fL)    MCH 30.6  26.0 - 34.0 (pg)    MCHC 33.7  30.0 - 36.0 (g/dL)    RDW 40.9  81.1 - 91.4 (%)    Platelets 192  150 - 400 (K/uL)   DIFFERENTIAL     Status: Abnormal   Collection Time   12/06/11  8:00 PM      Component Value Range Comment   Neutrophils Relative 42 (*) 43 - 77 (%)    Neutro Abs  3.0  1.7 - 7.7 (K/uL)    Lymphocytes Relative 40  12 - 46 (%)    Lymphs Abs 2.9  0.7 - 4.0 (K/uL)    Monocytes Relative 12  3 - 12 (%)    Monocytes Absolute 0.8  0.1 - 1.0 (K/uL)    Eosinophils Relative 6 (*) 0 - 5 (%)    Eosinophils Absolute 0.5  0.0 - 0.7 (K/uL)    Basophils Relative 0  0 - 1 (%)    Basophils Absolute 0.0  0.0 - 0.1 (K/uL)   COMPREHENSIVE METABOLIC PANEL     Status: Abnormal   Collection Time   12/06/11  8:00 PM      Component Value Range Comment   Sodium 142  135 - 145 (mEq/L)    Potassium 3.9  3.5 - 5.1 (mEq/L)    Chloride 107  96 - 112 (mEq/L)    CO2 28  19 - 32 (mEq/L)    Glucose, Bld 104 (*) 70 - 99 (mg/dL)    BUN 10  6 - 23 (mg/dL)    Creatinine, Ser 7.82  0.50 - 1.10 (mg/dL)    Calcium 95.6  8.4 - 10.5 (mg/dL)    Total Protein 7.7  6.0 - 8.3 (g/dL)    Albumin 3.6  3.5 - 5.2 (g/dL)    AST 47 (*) 0 - 37 (U/L)    ALT 30  0 - 35 (U/L)    Alkaline Phosphatase 153 (*) 39 - 117 (U/L)    Total Bilirubin 0.5  0.3 - 1.2 (mg/dL)    GFR calc non Af Amer 58 (*) >90 (mL/min)    GFR calc Af Amer 68 (*) >90 (mL/min)   PROTIME-INR     Status: Normal   Collection Time   12/06/11  8:00 PM      Component Value Range Comment   Prothrombin Time 13.6  11.6 - 15.2 (seconds)    INR 1.02  0.00 - 1.49    APTT     Status: Normal   Collection Time   12/06/11  8:00 PM      Component Value Range Comment   aPTT 29  24 - 37 (seconds)   CARDIAC PANEL(CRET KIN+CKTOT+MB+TROPI)     Status: Abnormal   Collection Time   12/06/11  8:00 PM      Component Value Range Comment   Total CK 141  7 - 177 (U/L)    CK,  MB 6.7 (*) 0.3 - 4.0 (ng/mL)    Troponin I <0.30  <0.30 (ng/mL)    Relative Index 4.8 (*) 0.0 - 2.5    PRO B NATRIURETIC PEPTIDE     Status: Normal   Collection Time   12/06/11  8:00 PM      Component Value Range Comment   Pro B Natriuretic peptide (BNP) 79.6  0 - 450 (pg/mL)   URINALYSIS, ROUTINE W REFLEX MICROSCOPIC     Status: Abnormal   Collection Time   12/06/11 10:06 PM        Component Value Range Comment   Color, Urine COLORLESS (*) YELLOW     APPearance CLEAR  CLEAR     Specific Gravity, Urine 1.005  1.005 - 1.030     pH 6.5  5.0 - 8.0     Glucose, UA NEGATIVE  NEGATIVE (mg/dL)    Hgb urine dipstick NEGATIVE  NEGATIVE     Bilirubin Urine NEGATIVE  NEGATIVE     Ketones, ur NEGATIVE  NEGATIVE (mg/dL)    Protein, ur NEGATIVE  NEGATIVE (mg/dL)    Urobilinogen, UA 0.2  0.0 - 1.0 (mg/dL)    Nitrite NEGATIVE  NEGATIVE     Leukocytes, UA NEGATIVE  NEGATIVE  MICROSCOPIC NOT DONE ON URINES WITH NEGATIVE PROTEIN, BLOOD, LEUKOCYTES, NITRITE, OR GLUCOSE <1000 mg/dL.  GLUCOSE, CAPILLARY     Status: Normal   Collection Time   12/07/11 12:51 AM      Component Value Range Comment   Glucose-Capillary 97  70 - 99 (mg/dL)    Ct Head Wo Contrast  12/06/2011  *RADIOLOGY REPORT*  Clinical Data: Dizziness,  CT HEAD WITHOUT CONTRAST  Technique:  Contiguous axial images were obtained from the base of the skull through the vertex without contrast.  Comparison: None.  Findings: No acute intracranial hemorrhage.  No focal mass lesion. No CT evidence of acute infarction.   No midline shift or mass effect.  No hydrocephalus.  Basilar cisterns are patent. No acute intracranial hemorrhage.  No focal mass lesion.  No CT evidence of acute infarction.   No midline shift or mass effect.  No hydrocephalus.  Basilar cisterns are patent.  IMPRESSION: No acute intracranial findings.  Original Report Authenticated By: Genevive Bi, M.D.   Dg Chest Port 1 View  12/06/2011  *RADIOLOGY REPORT*  Clinical Data: Generalized weakness  PORTABLE CHEST - 1 VIEW  Comparison: Thoracic spine films 02/16 1012  Findings: Normal mediastinum and heart silhouette.  There is bronchitic markings centrally.  No effusion, infiltrate, or pneumothorax.  IMPRESSION:  1.  No acute findings. 2.  Chronic bronchitic markings.  Original Report Authenticated By: Genevive Bi, M.D.    Assessment/Plan This is a 78 year  old female with history of dm and htn who presents with what appears to be an abrupt onset of gait instability and generalized weakness with possibilities including a vascular event versus orthostatic hypotension versus a systemic illness of uncertain etiology.  Plan: 1. HgbA1c, fasting lipid panel 2. MRI with and without contrast 3. Orthostatic blood pressure measurements 3. PT consult, OT consult,  4. Echocardiogram 5. Carotid dopplers 6. Prophylactic therapy-Agree with aspirin 325 mg for stroke prevention 7. Risk factor modification 8. Telemetry monitoring   [name] 12/07/2011, 1:59 AM

## 2011-12-07 NOTE — Discharge Summary (Signed)
DISCHARGE SUMMARY  [name]  MR#: [id number]  DOB:[birth year]  Date of Admission: 12/06/2011 Date of Discharge: 12/07/2011  Attending Physician:Farah Lepak K  Patient's YQM:VHQIONG,EXBMWUX DAVID, MD, MD  Consults: -Carmell Austria, neuro hospitalists  Discharge Diagnoses: Present on Admission:  .TIA (transient ischemic attack) .Hypertension .Thrombophlebitis .Hard of hearing .Macular degeneration .Diabetes mellitus .Coronary artery disease .Anemia, iron deficiency .Angiodysplasia of intestinal tract .Hyperlipidemia LDL goal < 70    Medication List  As of 12/07/2011  2:29 PM   TAKE these medications         ALPRAZolam 0.25 MG tablet   Commonly known as: XANAX   Take 0.25 mg by mouth at bedtime as needed. ANXIETY.      aspirin 325 MG tablet   Take 1 tablet (325 mg total) by mouth daily.      atenolol 100 MG tablet   Commonly known as: TENORMIN   Take 100 mg by mouth daily.      bumetanide 0.5 MG tablet   Commonly known as: BUMEX   Take 0.5 mg by mouth continuous as needed.      cholecalciferol 1000 UNITS tablet   Commonly known as: VITAMIN D   Take 1,000 Units by mouth daily.      cyclobenzaprine 10 MG tablet   Commonly known as: FLEXERIL   Take 10 mg by mouth 2 (two) times daily as needed. MUSCLE SPASM.      multivitamin-iron-minerals-folic acid chewable tablet   Chew 1 tablet by mouth daily.      NITROSTAT 0.4 MG SL tablet   Generic drug: nitroGLYCERIN   Place 0.4 mg under the tongue.      olmesartan 20 MG tablet   Commonly known as: BENICAR   Take 20 mg by mouth daily.      omeprazole 40 MG capsule   Commonly known as: PRILOSEC   Take 40 mg by mouth daily.             Hospital Course: Present on Admission:  .TIA (transient ischemic attack): Principal problem. Patient is an 78 year old white female with past medical history diabetes, CAD and hypertension who on day of admission, woke up after a nap and all of a sudden had a difficult time  walking. She was stumbling and unable to walk straight. She felt like she was almost going to fall over. She denied any vertigo-like symptoms in her only other issue was of some pressure in the back part of her head. She me back to her bed and called her daughter. Her symptoms resolved after about 15-20 minutes. Patient was brought into the emergency room. Emergency room she was noted to have elevated blood pressures (of which she is normally well controlled). Patient had a CT scan done which was unremarkable. Patient was then admitted for TIA workup.  Patient underwent full TIA workup protocol. She had carotid Dopplers-preliminary report looks to show no significant ICA stenosis. She underwent a 2-D echocardiogram which was unremarkable. Jugular fasting lipid profile which noted an LDL of 95. An A1c was ordered currently which is pending. Initial plan was for patient to get an MRI/MRA, but she does have a history of a vascular stent placed. Unfortunately, this is a Saturday and patient's vascular surgeon's office was not open until Monday in order to find the exact type of stent she had placed and it would it be appropriate for her to get an MRI.  After discussion with neurology, it was felt patient would be stable to be discharged  home. She otherwise is completed a negative workup. Her MRI results would not change treatment outcome which would include a daily aspirin which was started here. The patient's ideal LDL should be under 70 because of her history of diabetes, however she has tried statin medication in the past which have caused muscle and joint aches so she has not been able to tolerate this. The plan will be for patient to be discharged home today and on Monday, we'll find out if she is able to get an MRI and if so arrange as outpatient.  .Hypertension: Elevated, but improved since day of admission. Blood pressure down the 140s on discharge  .Thrombophlebitis: Stable.  .Hard of hearing:  Chronic.  .Macular degeneration: Stable.  .Diabetes mellitus: Stable. A1c ordered and is pending.  .Coronary artery disease: Stable.  .Anemia, iron deficiency: Stable.  .Angiodysplasia of intestinal tract: Stable.  .Hyperlipidemia LDL goal < 70: See above.   Day of Discharge BP 147/77  Pulse 87  Temp(Src) 98.3 F (36.8 C) (Oral)  Resp 20  Ht 5' 6.5" (1.689 m)  Wt 84.1 kg (185 lb 6.5 oz)  BMI 29.48 kg/m2  SpO2 93%  Physical Exam: General: Alert and oriented x3, no acute distress, fatigue, looks about stated age HEENT, supplement maximum extremities are moist, cranial nerves II through XII are intact  cardiovascular: Regular rate and rhythm, S1-S2 Lungs:" Bilaterally Abdomen: Soft, nontender, nondistended, positive bowel sounds Extremities, clubbing or cyanosis or edema Neuro: No focal deficits  Results for orders placed during the hospital encounter of 12/06/11 (from the past 24 hour(s))  CBC     Status: Abnormal   Collection Time   12/06/11  8:00 PM      Component Value Range   WBC 7.2  4.0 - 10.5 (K/uL)   RBC 5.00  3.87 - 5.11 (MIL/uL)   Hemoglobin 15.3 (*) 12.0 - 15.0 (g/dL)   HCT 16.1  09.6 - 04.5 (%)   MCV 90.8  78.0 - 100.0 (fL)   MCH 30.6  26.0 - 34.0 (pg)   MCHC 33.7  30.0 - 36.0 (g/dL)   RDW 40.9  81.1 - 91.4 (%)   Platelets 192  150 - 400 (K/uL)  DIFFERENTIAL     Status: Abnormal   Collection Time   12/06/11  8:00 PM      Component Value Range   Neutrophils Relative 42 (*) 43 - 77 (%)   Neutro Abs 3.0  1.7 - 7.7 (K/uL)   Lymphocytes Relative 40  12 - 46 (%)   Lymphs Abs 2.9  0.7 - 4.0 (K/uL)   Monocytes Relative 12  3 - 12 (%)   Monocytes Absolute 0.8  0.1 - 1.0 (K/uL)   Eosinophils Relative 6 (*) 0 - 5 (%)   Eosinophils Absolute 0.5  0.0 - 0.7 (K/uL)   Basophils Relative 0  0 - 1 (%)   Basophils Absolute 0.0  0.0 - 0.1 (K/uL)  COMPREHENSIVE METABOLIC PANEL     Status: Abnormal   Collection Time   12/06/11  8:00 PM      Component Value Range    Sodium 142  135 - 145 (mEq/L)   Potassium 3.9  3.5 - 5.1 (mEq/L)   Chloride 107  96 - 112 (mEq/L)   CO2 28  19 - 32 (mEq/L)   Glucose, Bld 104 (*) 70 - 99 (mg/dL)   BUN 10  6 - 23 (mg/dL)   Creatinine, Ser 7.82  0.50 - 1.10 (mg/dL)   Calcium  10.3  8.4 - 10.5 (mg/dL)   Total Protein 7.7  6.0 - 8.3 (g/dL)   Albumin 3.6  3.5 - 5.2 (g/dL)   AST 47 (*) 0 - 37 (U/L)   ALT 30  0 - 35 (U/L)   Alkaline Phosphatase 153 (*) 39 - 117 (U/L)   Total Bilirubin 0.5  0.3 - 1.2 (mg/dL)   GFR calc non Af Amer 58 (*) >90 (mL/min)   GFR calc Af Amer 68 (*) >90 (mL/min)  PROTIME-INR     Status: Normal   Collection Time   12/06/11  8:00 PM      Component Value Range   Prothrombin Time 13.6  11.6 - 15.2 (seconds)   INR 1.02  0.00 - 1.49   APTT     Status: Normal   Collection Time   12/06/11  8:00 PM      Component Value Range   aPTT 29  24 - 37 (seconds)  CARDIAC PANEL(CRET KIN+CKTOT+MB+TROPI)     Status: Abnormal   Collection Time   12/06/11  8:00 PM      Component Value Range   Total CK 141  7 - 177 (U/L)   CK, MB 6.7 (*) 0.3 - 4.0 (ng/mL)   Troponin I <0.30  <0.30 (ng/mL)   Relative Index 4.8 (*) 0.0 - 2.5   PRO B NATRIURETIC PEPTIDE     Status: Normal   Collection Time   12/06/11  8:00 PM      Component Value Range   Pro B Natriuretic peptide (BNP) 79.6  0 - 450 (pg/mL)  URINALYSIS, ROUTINE W REFLEX MICROSCOPIC     Status: Abnormal   Collection Time   12/06/11 10:06 PM      Component Value Range   Color, Urine COLORLESS (*) YELLOW    APPearance CLEAR  CLEAR    Specific Gravity, Urine 1.005  1.005 - 1.030    pH 6.5  5.0 - 8.0    Glucose, UA NEGATIVE  NEGATIVE (mg/dL)   Hgb urine dipstick NEGATIVE  NEGATIVE    Bilirubin Urine NEGATIVE  NEGATIVE    Ketones, ur NEGATIVE  NEGATIVE (mg/dL)   Protein, ur NEGATIVE  NEGATIVE (mg/dL)   Urobilinogen, UA 0.2  0.0 - 1.0 (mg/dL)   Nitrite NEGATIVE  NEGATIVE    Leukocytes, UA NEGATIVE  NEGATIVE   GLUCOSE, CAPILLARY     Status: Normal   Collection  Time   12/07/11 12:51 AM      Component Value Range   Glucose-Capillary 97  70 - 99 (mg/dL)  URINE RAPID DRUG SCREEN (HOSP PERFORMED)     Status: Normal   Collection Time   12/07/11  4:38 AM      Component Value Range   Opiates NONE DETECTED  NONE DETECTED    Cocaine NONE DETECTED  NONE DETECTED    Benzodiazepines NONE DETECTED  NONE DETECTED    Amphetamines NONE DETECTED  NONE DETECTED    Tetrahydrocannabinol NONE DETECTED  NONE DETECTED    Barbiturates NONE DETECTED  NONE DETECTED   BASIC METABOLIC PANEL     Status: Abnormal   Collection Time   12/07/11  5:56 AM      Component Value Range   Sodium 142  135 - 145 (mEq/L)   Potassium 3.9  3.5 - 5.1 (mEq/L)   Chloride 110  96 - 112 (mEq/L)   CO2 25  19 - 32 (mEq/L)   Glucose, Bld 110 (*) 70 - 99 (mg/dL)   BUN  8  6 - 23 (mg/dL)   Creatinine, Ser 1.91  0.50 - 1.10 (mg/dL)   Calcium 9.2  8.4 - 47.8 (mg/dL)   GFR calc non Af Amer 78 (*) >90 (mL/min)   GFR calc Af Amer >90  >90 (mL/min)  CBC     Status: Normal   Collection Time   12/07/11  5:56 AM      Component Value Range   WBC 6.7  4.0 - 10.5 (K/uL)   RBC 4.53  3.87 - 5.11 (MIL/uL)   Hemoglobin 13.9  12.0 - 15.0 (g/dL)   HCT 29.5  62.1 - 30.8 (%)   MCV 89.4  78.0 - 100.0 (fL)   MCH 30.7  26.0 - 34.0 (pg)   MCHC 34.3  30.0 - 36.0 (g/dL)   RDW 65.7  84.6 - 96.2 (%)   Platelets 161  150 - 400 (K/uL)  HEMOGLOBIN A1C     Status: Abnormal   Collection Time   12/07/11  5:56 AM      Component Value Range   Hemoglobin A1C 5.8 (*) <5.7 (%)   Mean Plasma Glucose 120 (*) <117 (mg/dL)  LIPID PANEL     Status: Abnormal   Collection Time   12/07/11  5:56 AM      Component Value Range   Cholesterol 150  0 - 200 (mg/dL)   Triglycerides 952  <841 (mg/dL)   HDL 32 (*) >32 (mg/dL)   Total CHOL/HDL Ratio 4.7     VLDL 24  0 - 40 (mg/dL)   LDL Cholesterol 94  0 - 99 (mg/dL)  GLUCOSE, CAPILLARY     Status: Abnormal   Collection Time   12/07/11  7:49 AM      Component Value Range    Glucose-Capillary 112 (*) 70 - 99 (mg/dL)   Comment 1 Documented in Chart     Comment 2 Notify RN    GLUCOSE, CAPILLARY     Status: Abnormal   Collection Time   12/07/11 11:34 AM      Component Value Range   Glucose-Capillary 134 (*) 70 - 99 (mg/dL)   Comment 1 Documented in Chart     Comment 2 Notify RN      Disposition: Improved, being discharged home   Follow-up Appts: Discharge Orders    Future Appointments: Provider: Department: Dept Phone: Center:   01/29/2012 10:30 AM Rachael Fee [place] [phone] None   01/29/2012 11:00 AM Josph Macho, MD [facility] [phone] None     Future Orders Please Complete By Expires   Diet - low sodium heart healthy      Diet Carb Modified      Increase activity slowly         Follow-up Information    Follow up with [facility] in 2 weeks. (Call on Monday to schedule your MRI-birng your prescription with you)    Contact information:   [phone]         Tests Needing Follow-up: Will need to find out what type of vascular stent she is put in and then followup MRI-results to her PCP  Time spent in discharge (includes decision making & examination of pt): 50 minutes  Signed: Hollice Espy 12/07/2011, 2:29 PM

## 2011-12-07 NOTE — Discharge Instructions (Signed)
Transient Ischemic Attack A transient ischemic attack (TIA) is a "warning stroke" that causes stroke-like symptoms. Unlike a stroke, a TIA does not cause permanent damage to the brain. The symptoms of a TIA can happen very fast and do not last long. It is important to know the symptoms of a TIA and what to do. This can help prevent a major stroke or death. CAUSES   A TIA is caused by a temporary blockage in an artery in the brain or neck (carotid artery). The blockage does not allow the brain to get the blood supply it needs and can cause different symptoms. The blockage can be caused by either:   A blood clot.   Fatty buildup (plaque) in a neck or brain artery.  SYMPTOMS  TIA symptoms are the same as a stroke but are temporary. Symptoms can include sudden:  Numbness or weakness on one side of the body. Especially to the:   Face.   Arm.   Leg.   Trouble speaking, thinking, or confusion.   Change in vision, such as trouble seeing in one or both eyes.   Dizziness, loss of balance, or difficulty walking.   Severe headache.  ANY OF THESE SYMPTOMS MAY REPRESENT A SERIOUS PROBLEM THAT IS AN EMERGENCY. Do not wait to see if the symptoms will go away. Get medical help at once. Call your local emergency services (911 in [place] IMMEDIATELY. DO NOT drive yourself to the hospital. RISK FACTORS Risk factors can increase the risk of developing a TIA. These can include.   High blood pressure (hypertension).   High cholesterol (hyperlipidemia).   Heart disease (atherosclerosis).   Smoking.   Diabetes.   Abnormal heart rhythm (atrial fibrillation).   Family history of a stroke or heart attack.   Use of oral contraceptives (especially when combined with smoking).  DIAGNOSIS   A TIA can be diagnosed based on your:   Symptoms.   History.   Risk factors.   Tests that can help diagnose the symptoms of a TIA include:   CT or MRI scan. These tests can provide detailed images of the  brain.   Carotid ultrasound. This test looks to see if there are blockages in the carotid arteries of your neck.   Arteriography. A thin, small flexible tube (catheter) is inserted through a small cut (incision) in your groin. The catheter is threaded to your carotid or vertebral artery. A dye is then injected into the catheter. The dye highlights the arteries in your brain and allows your caregiver to look for narrowing or blockages that can cause a TIA.  TREATMENT  Based on the cause of a TIA, treatment options can vary. Treatment is important to help prevent a stroke. Treatment options can include:  Medication. Such as:   Clot-busting medicine.   Anti-platelet medicine.   Blood pressure medicine.   Blood thinner medicine.   Surgery:   Carotid endarterectomy. The carotid arteries are the arteries that supply the head and neck with oxygenated blood. This surgery can help remove fatty deposits (plaque) in the carotid arteries.   Angioplasty and stenting. This surgery uses a balloon to dilate a blocked artery in the brain. A stent is a small, metal mesh tube that can help keep an artery open  HOME CARE INSTRUCTIONS   It is important to take all medicine as told by your caregiver. If the medicine has side effects that affect you negatively, tell your caregiver right away. Do not stop taking medicine unless told   by your caregiver. Some medicines may need to be changed to better treat your condition.   Do not smoke. Talk to your caregiver on how to quit smoking.   Eat a diet high in fruits, vegetables and lean meat. Avoid a high fat, high salt diet. A dietician can you help you make healthy food choices.   Maintain a healthy weight. Develop an exercise plan approved by your caregiver.  SEEK IMMEDIATE MEDICAL CARE IF:   You develop weakness or numbness on one side of your body.   You have problems thinking, speaking, or feel confused.   You have vision changes.   You feel dizzy,  have trouble walking, or lose your balance.   You develop a severe headache.  MAKE SURE YOU:   Understand these instructions.   Will watch your condition.   Will get help right away if you are not doing well or get worse.  Document Released: 06/19/2005 Document Revised: 08/29/2011 Document Reviewed: 11/02/2009 [employer] Patient Information 2012 [employer]. 

## 2011-12-07 NOTE — Progress Notes (Signed)
  Echocardiogram 2D Echocardiogram has been performed.  [name] [facility] 12/07/2011, 8:31 AM

## 2011-12-09 ENCOUNTER — Other Ambulatory Visit: Payer: Self-pay | Admitting: Internal Medicine

## 2011-12-09 ENCOUNTER — Other Ambulatory Visit: Payer: Medicare Other

## 2011-12-09 DIAGNOSIS — G459 Transient cerebral ischemic attack, unspecified: Secondary | ICD-10-CM

## 2011-12-14 ENCOUNTER — Ambulatory Visit
Admission: RE | Admit: 2011-12-14 | Discharge: 2011-12-14 | Disposition: A | Discharge status: Home / Self Care | Payer: Medicare Other | Source: Ambulatory Visit | Attending: Internal Medicine | Admitting: Internal Medicine

## 2011-12-14 DIAGNOSIS — G459 Transient cerebral ischemic attack, unspecified: Secondary | ICD-10-CM

## 2011-12-14 MED ORDER — GADOBENATE DIMEGLUMINE 529 MG/ML IV SOLN
17.0000 mL | Freq: Once | INTRAVENOUS | Status: AC | PRN
  Administered 2011-12-14: 17 mL via INTRAVENOUS

## 2012-01-29 ENCOUNTER — Ambulatory Visit (HOSPITAL_BASED_OUTPATIENT_CLINIC_OR_DEPARTMENT_OTHER): Payer: Medicare Other | Admitting: Hematology & Oncology

## 2012-01-29 ENCOUNTER — Other Ambulatory Visit (HOSPITAL_BASED_OUTPATIENT_CLINIC_OR_DEPARTMENT_OTHER): Payer: Medicare Other | Admitting: Lab

## 2012-01-29 VITALS — BP 139/72 | HR 69 | Temp 97.4°F | Ht 66.0 in | Wt 189.0 lb

## 2012-01-29 DIAGNOSIS — H6120 Impacted cerumen, unspecified ear: Secondary | ICD-10-CM

## 2012-01-29 DIAGNOSIS — D509 Iron deficiency anemia, unspecified: Secondary | ICD-10-CM

## 2012-01-29 DIAGNOSIS — K5520 Angiodysplasia of colon without hemorrhage: Secondary | ICD-10-CM

## 2012-01-29 DIAGNOSIS — M549 Dorsalgia, unspecified: Secondary | ICD-10-CM

## 2012-01-29 LAB — CBC WITH DIFFERENTIAL (CANCER CENTER ONLY)
BASO%: 0.5 % (ref 0.0–2.0)
EOS%: 3.1 % (ref 0.0–7.0)
LYMPH#: 2.4 10*3/uL (ref 0.9–3.3)
LYMPH%: 37.5 % (ref 14.0–48.0)
MCHC: 33.6 g/dL (ref 32.0–36.0)
MONO#: 0.8 10*3/uL (ref 0.1–0.9)
Platelets: 158 10*3/uL (ref 145–400)
RDW: 13.1 % (ref 11.1–15.7)
WBC: 6.4 10*3/uL (ref 3.9–10.0)

## 2012-01-29 LAB — FERRITIN: Ferritin: 72 ng/mL (ref 10–291)

## 2012-01-29 LAB — RETICULOCYTES (CHCC)
ABS Retic: 32 10*3/uL (ref 19.0–186.0)
RBC.: 4.57 MIL/uL (ref 3.87–5.11)

## 2012-01-29 NOTE — Progress Notes (Signed)
This office note has been dictated.

## 2012-01-30 NOTE — Progress Notes (Signed)
CC:   Lucky Cowboy, M.D. Dartanion Teo M. [place], M.D. [name], M.D.  DIAGNOSES: 1. Iron deficiency anemia. 2. GI angiodysplasia.  CURRENT THERAPY:  Observation.  INTERVAL HISTORY:  [name] comes in for followup.  She is doing okay. She was in the hospital for 1 day in March.  She had a "TIA."  She said all the tests came back negative.  Otherwise she is doing okay.  She is having some back issues.  There is no indication for any kind of surgical intervention from what she tells me.  We basically see her every 6 months.  When we last saw her, her ferritin was 157.  Iron saturation was 48%.  Appetite has been good.  She has had no nausea or vomiting. There has been no melena or bright red blood per rectum.  PHYSICAL EXAMINATION:  This is an elderly white female in no obvious distress.  Vital signs: 97.4, pulse 69, respiratory rate 18, blood pressure 139/72.  Weight is 189.  Head and neck:  Normocephalic, atraumatic skull.  There are no ocular or oral lesions.  There are no palpable cervical or supraclavicular lymph nodes.  Lungs:  Clear bilaterally.  Cardiac: Regular rate and rhythm with a normal S1 and S2. There are no murmurs, rubs or bruits.  Abdomen:  Soft with good bowel sounds.  There is no fluid wave.  There is no palpable abdominal mass. There is no palpable hepatosplenomegaly.  Back: No tenderness over the spine, ribs, or hips.  Extremities: No clubbing, cyanosis or edema. Neurological:  No focal neurological deficits.  Skin: No rashes, ecchymoses or petechia.  LABORATORY STUDIES:  White cell count is 6.4, hemoglobin 14.6, hematocrit 43.5, platelet count 158.  MCV is 93.  IMPRESSION:  Ms. Fritsch is an 78 year old white female with angiodysplasia of the GI tract.  Thankfully, she has not had any bleeding for a while.  We see her every 6 months.  I will plan to get her back in 6 months' time.  I do not see any need for any blood work in between  visits.  ADDENDUM:  I did look into her ears.  There is a lot of cerumen in the right ear canal.  Her left ear showed a relatively clear external auditory canal.  She had a good light reflex on the tympanic membrane. There is no fluid behind the tympanic membrane.    ______________________________ Josph Macho, M.D. PRE/MEDQ  D:  01/29/2012  T:  01/30/2012  Job:  [id number]

## 2012-02-03 ENCOUNTER — Telehealth: Payer: Self-pay | Admitting: *Deleted

## 2012-02-03 NOTE — Telephone Encounter (Signed)
Message copied by Anselm Jungling on Mon Feb 03, 2012  9:55 AM ------      Message from: Josph Macho      Created: Fri Jan 31, 2012  7:39 AM       Call - iron is good!! pete

## 2012-02-03 NOTE — Telephone Encounter (Signed)
Called patient to let her know that her labwork was good per dr. ennever 

## 2012-07-27 ENCOUNTER — Telehealth: Payer: Self-pay | Admitting: *Deleted

## 2012-07-27 NOTE — Telephone Encounter (Signed)
Spoke with [name] in Dr. Kathryne Sharper office. She looked up the labs she had done on 07/09/12 and the iron studies were not included. The only similar study was a CBC. The pt is trying to limit the # of visits. Reviewed with Dr Myna Hidalgo. To keep her appt this month then will see her PRN. Dr. Oneta Rack will check her CBC, Iron, TIBC, Ferritin, & Retic counts.

## 2012-08-05 ENCOUNTER — Ambulatory Visit (HOSPITAL_BASED_OUTPATIENT_CLINIC_OR_DEPARTMENT_OTHER): Payer: Medicare Other | Admitting: Hematology & Oncology

## 2012-08-05 ENCOUNTER — Other Ambulatory Visit (HOSPITAL_BASED_OUTPATIENT_CLINIC_OR_DEPARTMENT_OTHER): Payer: Medicare Other | Admitting: Lab

## 2012-08-05 VITALS — BP 150/53 | HR 85 | Temp 97.9°F | Resp 18 | Ht 66.0 in | Wt 198.0 lb

## 2012-08-05 DIAGNOSIS — D509 Iron deficiency anemia, unspecified: Secondary | ICD-10-CM

## 2012-08-05 DIAGNOSIS — K5520 Angiodysplasia of colon without hemorrhage: Secondary | ICD-10-CM

## 2012-08-05 DIAGNOSIS — K922 Gastrointestinal hemorrhage, unspecified: Secondary | ICD-10-CM

## 2012-08-05 LAB — CBC WITH DIFFERENTIAL (CANCER CENTER ONLY)
BASO#: 0 10*3/uL (ref 0.0–0.2)
BASO%: 0.3 % (ref 0.0–2.0)
EOS%: 3.4 % (ref 0.0–7.0)
HCT: 40.8 % (ref 34.8–46.6)
HGB: 14.2 g/dL (ref 11.6–15.9)
LYMPH#: 2.4 10*3/uL (ref 0.9–3.3)
MCH: 32 pg (ref 26.0–34.0)
MCHC: 34.8 g/dL (ref 32.0–36.0)
MONO%: 12.3 % (ref 0.0–13.0)
NEUT%: 45.5 % (ref 39.6–80.0)
RDW: 13.2 % (ref 11.1–15.7)

## 2012-08-05 LAB — IRON AND TIBC: UIBC: 249 ug/dL (ref 125–400)

## 2012-08-05 NOTE — Progress Notes (Signed)
This office note has been dictated.

## 2012-08-06 NOTE — Progress Notes (Signed)
CC:   Lucky Cowboy, M.D.  DIAGNOSIS:  History of recurrent iron-deficiency anemia.  CURRENT THERAPY:  Observation.  INTERIM HISTORY:  Ms. Deshmukh comes in for followup.  This likely will be the last time we need to see her on a scheduled appointment.  She sees Dr. Lucky Cowboy.  He has done a great job with her and her lab work. I told her that we really do not need to double up as he is pretty much doing the necessary lab work for her iron deficiency.  She has had no problems since we last saw her.  There has been problem with TIAs or other neurological issues.  When we last saw her, her iron studies showed a ferritin of 72.  She has not needed iron for quite a while.  She does have angiodysplasia within the GI tract.  She has not noticed any melena or bright red blood per rectum.  PHYSICAL EXAMINATION:  General:  This is a well-developed, well- nourished white female in no obvious distress.  Vital signs:  97.9, pulse 85, respiratory rate 18, blood pressure 150/53.  Weight is 198. Head and neck:  Normocephalic, atraumatic skull.  There are no ocular or oral lesions.  There are no palpable cervical or supraclavicular lymph nodes.  Lungs:  Clear bilaterally.  Cardiac:  Regular rate and rhythm with a normal S1 and S2.  There are no murmurs, rubs, or bruits. Abdomen:  Soft with good bowel sounds.  There is no fluid wave.  There is no guarding or rebound tenderness.  There is no palpable hepatosplenomegaly.  Back:  No tenderness over the spine, ribs, or hips. Extremities:  No clubbing, cyanosis, or edema.  LABORATORY STUDIES:  White cell count is 6.2, hemoglobin 14.2, hematocrit 40.8, platelet count 128.  MCV is 92.  IMPRESSION:  Ms. Lorah is an 78 year old white female with history of iron-deficiency anemia.  She has history of gastrointestinal angiodysplasias and gastrointestinal bleeding.  She has not had any gastrointestinal bleed for several years.  From my point of  view, I really think we can hold off on having her to come back on a regular appointment schedule.  She is starting to get somewhat elderly.  It is getting hard for her to come out to see [place].  I told her I totally understood her not having to come out here.  I told that I was not "mad" with her.  I supported her and I told her we would always be here for her.  I am sure we will see Ms. Yoshino at some point in the future.  I did note that her platelet count was slightly decreased.  I think this may be a "natural fluctuation."    ______________________________ Josph Macho, M.D. PRE/MEDQ  D:  08/05/2012  T:  08/06/2012  Job:  [id number]

## 2012-08-19 ENCOUNTER — Telehealth: Payer: Self-pay | Admitting: Hematology & Oncology

## 2012-08-19 NOTE — Telephone Encounter (Addendum)
Message copied by Cathi Roan on Wed Aug 19, 2012  1:40 PM ------      Message from: [place], [place] N      Created: Fri Aug 07, 2012 11:18 AM                   ----- Message -----         From: Josph Macho, MD         Sent: 08/06/2012   8:32 PM           To: Nanci Pina Nurse Hp            Call - iron level is good!!  Cindee Lame  08-19-12  Called and spoke with pt regarding above MD note, Pt verbalized understanding.  Lupita Raider LPN

## 2012-08-27 ENCOUNTER — Other Ambulatory Visit: Payer: Self-pay | Admitting: Internal Medicine

## 2012-08-27 DIAGNOSIS — Z1231 Encounter for screening mammogram for malignant neoplasm of breast: Secondary | ICD-10-CM

## 2012-08-29 ENCOUNTER — Emergency Department (HOSPITAL_COMMUNITY): Payer: Medicare Other

## 2012-08-29 ENCOUNTER — Inpatient Hospital Stay (HOSPITAL_COMMUNITY)
Admission: EM | Admit: 2012-08-29 | Discharge: 2012-09-05 | DRG: 378 | Disposition: A | Discharge status: Home Health | Payer: Medicare Other | Attending: Family Medicine | Admitting: Family Medicine

## 2012-08-29 ENCOUNTER — Encounter (HOSPITAL_COMMUNITY): Payer: Self-pay | Admitting: *Deleted

## 2012-08-29 DIAGNOSIS — K253 Acute gastric ulcer without hemorrhage or perforation: Secondary | ICD-10-CM

## 2012-08-29 DIAGNOSIS — C220 Liver cell carcinoma: Secondary | ICD-10-CM

## 2012-08-29 DIAGNOSIS — F411 Generalized anxiety disorder: Secondary | ICD-10-CM | POA: Diagnosis present

## 2012-08-29 DIAGNOSIS — K254 Chronic or unspecified gastric ulcer with hemorrhage: Principal | ICD-10-CM | POA: Diagnosis present

## 2012-08-29 DIAGNOSIS — D509 Iron deficiency anemia, unspecified: Secondary | ICD-10-CM | POA: Diagnosis present

## 2012-08-29 DIAGNOSIS — I252 Old myocardial infarction: Secondary | ICD-10-CM

## 2012-08-29 DIAGNOSIS — Q2733 Arteriovenous malformation of digestive system vessel: Secondary | ICD-10-CM

## 2012-08-29 DIAGNOSIS — I2510 Atherosclerotic heart disease of native coronary artery without angina pectoris: Secondary | ICD-10-CM | POA: Diagnosis present

## 2012-08-29 DIAGNOSIS — R7309 Other abnormal glucose: Secondary | ICD-10-CM | POA: Diagnosis present

## 2012-08-29 DIAGNOSIS — Z79899 Other long term (current) drug therapy: Secondary | ICD-10-CM

## 2012-08-29 DIAGNOSIS — Z8719 Personal history of other diseases of the digestive system: Secondary | ICD-10-CM

## 2012-08-29 DIAGNOSIS — I10 Essential (primary) hypertension: Secondary | ICD-10-CM | POA: Diagnosis present

## 2012-08-29 DIAGNOSIS — D696 Thrombocytopenia, unspecified: Secondary | ICD-10-CM | POA: Diagnosis present

## 2012-08-29 DIAGNOSIS — K7460 Unspecified cirrhosis of liver: Secondary | ICD-10-CM | POA: Diagnosis present

## 2012-08-29 DIAGNOSIS — K449 Diaphragmatic hernia without obstruction or gangrene: Secondary | ICD-10-CM | POA: Diagnosis present

## 2012-08-29 DIAGNOSIS — Z951 Presence of aortocoronary bypass graft: Secondary | ICD-10-CM

## 2012-08-29 DIAGNOSIS — K766 Portal hypertension: Secondary | ICD-10-CM | POA: Diagnosis present

## 2012-08-29 DIAGNOSIS — K921 Melena: Secondary | ICD-10-CM

## 2012-08-29 DIAGNOSIS — R160 Hepatomegaly, not elsewhere classified: Secondary | ICD-10-CM

## 2012-08-29 DIAGNOSIS — K7689 Other specified diseases of liver: Secondary | ICD-10-CM | POA: Diagnosis present

## 2012-08-29 DIAGNOSIS — K922 Gastrointestinal hemorrhage, unspecified: Secondary | ICD-10-CM

## 2012-08-29 DIAGNOSIS — J90 Pleural effusion, not elsewhere classified: Secondary | ICD-10-CM | POA: Diagnosis present

## 2012-08-29 DIAGNOSIS — E119 Type 2 diabetes mellitus without complications: Secondary | ICD-10-CM | POA: Diagnosis present

## 2012-08-29 DIAGNOSIS — D62 Acute posthemorrhagic anemia: Secondary | ICD-10-CM | POA: Diagnosis present

## 2012-08-29 DIAGNOSIS — E876 Hypokalemia: Secondary | ICD-10-CM | POA: Diagnosis present

## 2012-08-29 DIAGNOSIS — Z8673 Personal history of transient ischemic attack (TIA), and cerebral infarction without residual deficits: Secondary | ICD-10-CM

## 2012-08-29 DIAGNOSIS — Z6829 Body mass index (BMI) 29.0-29.9, adult: Secondary | ICD-10-CM

## 2012-08-29 DIAGNOSIS — E440 Moderate protein-calorie malnutrition: Secondary | ICD-10-CM | POA: Diagnosis present

## 2012-08-29 DIAGNOSIS — R531 Weakness: Secondary | ICD-10-CM

## 2012-08-29 DIAGNOSIS — E860 Dehydration: Secondary | ICD-10-CM

## 2012-08-29 HISTORY — DX: Unspecified cataract: H26.9

## 2012-08-29 HISTORY — DX: Transient cerebral ischemic attack, unspecified: G45.9

## 2012-08-29 HISTORY — DX: Gastro-esophageal reflux disease without esophagitis: K21.9

## 2012-08-29 HISTORY — DX: Asymptomatic varicose veins of bilateral lower extremities: I83.93

## 2012-08-29 HISTORY — DX: Diaphragmatic hernia without obstruction or gangrene: K44.9

## 2012-08-29 HISTORY — DX: Angiodysplasia of colon with hemorrhage: K55.21

## 2012-08-29 HISTORY — DX: Acute myocardial infarction, unspecified: I21.9

## 2012-08-29 LAB — URINALYSIS, ROUTINE W REFLEX MICROSCOPIC
Hgb urine dipstick: NEGATIVE
Nitrite: NEGATIVE
Protein, ur: NEGATIVE mg/dL
Specific Gravity, Urine: 1.023 (ref 1.005–1.030)
Urobilinogen, UA: 0.2 mg/dL (ref 0.0–1.0)

## 2012-08-29 LAB — CBC WITH DIFFERENTIAL/PLATELET
Basophils Absolute: 0 10*3/uL (ref 0.0–0.1)
Basophils Relative: 0 % (ref 0–1)
HCT: 40 % (ref 36.0–46.0)
Lymphocytes Relative: 24 % (ref 12–46)
MCHC: 33.8 g/dL (ref 30.0–36.0)
Monocytes Absolute: 1.1 10*3/uL — ABNORMAL HIGH (ref 0.1–1.0)
Neutro Abs: 5.8 10*3/uL (ref 1.7–7.7)
Neutrophils Relative %: 64 % (ref 43–77)
Platelets: 194 10*3/uL (ref 150–400)
RDW: 12.7 % (ref 11.5–15.5)
WBC: 9.1 10*3/uL (ref 4.0–10.5)

## 2012-08-29 LAB — CBC
HCT: 36.5 % (ref 36.0–46.0)
HCT: 34.2 % — ABNORMAL LOW (ref 36.0–46.0)
Hemoglobin: 12.1 g/dL (ref 12.0–15.0)
Hemoglobin: 11.5 g/dL — ABNORMAL LOW (ref 12.0–15.0)
MCH: 30.6 pg (ref 26.0–34.0)
MCHC: 33.6 g/dL (ref 30.0–36.0)
MCV: 92.2 fL (ref 78.0–100.0)
MCV: 92.4 fL (ref 78.0–100.0)
RBC: 3.96 MIL/uL (ref 3.87–5.11)
RDW: 13.1 % (ref 11.5–15.5)

## 2012-08-29 LAB — URINE MICROSCOPIC-ADD ON

## 2012-08-29 LAB — TYPE AND SCREEN
ABO/RH(D): O NEG
Antibody Screen: NEGATIVE

## 2012-08-29 LAB — COMPREHENSIVE METABOLIC PANEL
ALT: 30 U/L (ref 0–35)
BUN: 30 mg/dL — ABNORMAL HIGH (ref 6–23)
CO2: 23 mEq/L (ref 19–32)
Calcium: 10.4 mg/dL (ref 8.4–10.5)
Creatinine, Ser: 0.81 mg/dL (ref 0.50–1.10)
GFR calc Af Amer: 76 mL/min — ABNORMAL LOW (ref >90)
GFR calc non Af Amer: 66 mL/min — ABNORMAL LOW (ref >90)
Glucose, Bld: 138 mg/dL — ABNORMAL HIGH (ref 70–99)
Sodium: 140 mEq/L (ref 135–145)
Total Protein: 6.8 g/dL (ref 6.0–8.3)

## 2012-08-29 LAB — OCCULT BLOOD, POC DEVICE: Fecal Occult Bld: POSITIVE

## 2012-08-29 MED ORDER — ONDANSETRON HCL 4 MG/2ML IJ SOLN
4.0000 mg | Freq: Once | INTRAMUSCULAR | Status: AC
  Administered 2012-08-29: 4 mg via INTRAVENOUS
  Filled 2012-08-29: qty 2

## 2012-08-29 MED ORDER — PROMETHAZINE HCL 25 MG/ML IJ SOLN
12.5000 mg | Freq: Four times a day (QID) | INTRAMUSCULAR | Status: DC | PRN
  Administered 2012-08-29: 12.5 mg via INTRAVENOUS
  Filled 2012-08-29: qty 1

## 2012-08-29 MED ORDER — ACETAMINOPHEN 325 MG PO TABS: 650.0000 mg | ORAL_TABLET | Freq: Four times a day (QID) | ORAL | Status: DC | PRN

## 2012-08-29 MED ORDER — ONDANSETRON HCL 4 MG/2ML IJ SOLN: 4.0000 mg | Freq: Four times a day (QID) | INTRAMUSCULAR | Status: DC | PRN

## 2012-08-29 MED ORDER — SODIUM CHLORIDE 0.9 % IV SOLN: INTRAVENOUS | Status: DC

## 2012-08-29 MED ORDER — SODIUM CHLORIDE 0.9 % IV BOLUS (SEPSIS)
1000.0000 mL | Freq: Once | INTRAVENOUS | Status: AC
  Administered 2012-08-29: 1000 mL via INTRAVENOUS

## 2012-08-29 MED ORDER — SODIUM CHLORIDE 0.9 % IV SOLN: INTRAVENOUS | Status: AC

## 2012-08-29 MED ORDER — ALPRAZOLAM 0.25 MG PO TABS
0.2500 mg | ORAL_TABLET | Freq: Every evening | ORAL | Status: DC | PRN
  Filled 2012-08-29: qty 2

## 2012-08-29 MED ORDER — MORPHINE SULFATE 4 MG/ML IJ SOLN
4.0000 mg | INTRAMUSCULAR | Status: DC | PRN
  Administered 2012-08-29: 4 mg via INTRAVENOUS
  Filled 2012-08-29: qty 1

## 2012-08-29 MED ORDER — ONDANSETRON HCL 4 MG/2ML IJ SOLN
4.0000 mg | Freq: Three times a day (TID) | INTRAMUSCULAR | Status: AC | PRN
  Filled 2012-08-29: qty 2

## 2012-08-29 MED ORDER — SODIUM CHLORIDE 0.9 % IV SOLN
INTRAVENOUS | Status: DC
  Administered 2012-08-30 – 2012-09-01 (×4): via INTRAVENOUS
  Administered 2012-09-02: 20 mL/h via INTRAVENOUS

## 2012-08-29 MED ORDER — VITAMIN D3 25 MCG (1000 UNIT) PO TABS
1000.0000 [IU] | ORAL_TABLET | Freq: Every day | ORAL | Status: DC
  Administered 2012-08-30 – 2012-09-05 (×6): 1000 [IU] via ORAL
  Filled 2012-08-29 (×8): qty 1

## 2012-08-29 MED ORDER — ACETAMINOPHEN 650 MG RE SUPP: 650.0000 mg | Freq: Four times a day (QID) | RECTAL | Status: DC | PRN

## 2012-08-29 MED ORDER — ONDANSETRON HCL 4 MG PO TABS: 4.0000 mg | ORAL_TABLET | Freq: Four times a day (QID) | ORAL | Status: DC | PRN

## 2012-08-29 MED ORDER — ATENOLOL 100 MG PO TABS
100.0000 mg | ORAL_TABLET | Freq: Every day | ORAL | Status: DC
  Administered 2012-08-30 – 2012-09-05 (×7): 100 mg via ORAL
  Filled 2012-08-29 (×8): qty 1

## 2012-08-29 MED ORDER — OXYCODONE HCL 5 MG PO TABS
5.0000 mg | ORAL_TABLET | ORAL | Status: DC | PRN
  Administered 2012-08-30: 5 mg via ORAL
  Filled 2012-08-29: qty 1

## 2012-08-29 MED ORDER — SODIUM CHLORIDE 0.9 % IV SOLN
8.0000 mg/h | INTRAVENOUS | Status: DC
  Administered 2012-08-30: 8 mg/h via INTRAVENOUS
  Filled 2012-08-29 (×4): qty 80

## 2012-08-29 MED ORDER — MORPHINE SULFATE 4 MG/ML IJ SOLN
4.0000 mg | Freq: Once | INTRAMUSCULAR | Status: AC
  Administered 2012-08-29: 4 mg via INTRAVENOUS
  Filled 2012-08-29: qty 1

## 2012-08-29 MED ORDER — ALUM & MAG HYDROXIDE-SIMETH 200-200-20 MG/5ML PO SUSP: 30.0000 mL | Freq: Four times a day (QID) | ORAL | Status: DC | PRN

## 2012-08-29 NOTE — ED Notes (Signed)
[name], MD text paged and made aware of pt having one episode of black emesis. Pt denies nausea currently. Awaiting return call or orders.

## 2012-08-29 NOTE — ED Provider Notes (Signed)
Medical screening examination/treatment/procedure(s) were conducted as a shared visit with non-physician practitioner(s) and myself.  I personally evaluated the patient during the encounter  Will admit for melena. Likely secondary to angiodysplasia. Vitals normal. hgb okay now  Lyanne Co, MD 08/29/12 [phone]

## 2012-08-29 NOTE — ED Notes (Addendum)
Pt reports black, tarry stools since early this am. 10-15 episodes but denies diarrhea. Hx diverticulitis. Was advised to come to ED by GI if feeling weak. Reported seeing "spots in her vision." Denies abd pain, n/v. Denies taking blood thinners.

## 2012-08-29 NOTE — ED Notes (Signed)
PT denies CP, reports occ dry cough.

## 2012-08-29 NOTE — ED Notes (Signed)
Pt reports feeling weak about 1 week.

## 2012-08-29 NOTE — ED Notes (Signed)
Attempted to call report, RN unavailable and will call abck.

## 2012-08-29 NOTE — H&P (Signed)
[facility] History and Physical  SOLASH TULLO ZOX:[id number] DOB: Jul 24, 1928 DOA: 08/29/2012  Referring physician: Trixie Dredge, PA PCP: Nadean Corwin, MD  GI: Dr. Ewing Schlein  Chief Complaint: Melena   History of Present Illness: [name] is an 78 y.o. female with a PMH of hiatal hernia and esophageal ring requiring Savary dilatation and diverticulosis with a tortuous colon on prior colonoscopy, under the care of Dr. Ewing Schlein, who presented with a chief complaint of melanotic stools. The patient had multiple melanotic stools that began after midnight tonight.  She has had abdominal cramping, bloating, and has had 10-12 stools that were "black as ink".  She does take a daily aspirin. Feels weak, but no dizziness, chest pain or SOB. Upon initial evaluation in the emergency department, on physical exam, she was found to have heme positive stool. Noted also to have an episode of black emesis while in the emergency department. She was referred to the hospitalist service for further evaluation and treatment.  Review of Systems: Constitutional: No fever, no chills;  Appetite normal; + weight loss, no weight gain.  HEENT: No blurry vision, no diplopia, no pharyngitis, no dysphagia CV: No chest pain, no palpitations.  Resp: No SOB, no cough. GI: No nausea, no vomiting, no diarrhea, + melena, no hematochezia.  GU: No dysuria, no hematuria.  MSK: no myalgias, + back arthralgias.  Neuro:  No headache, no focal neurological deficits, no history of seizures.  Psych: No depression, no anxiety.  Endo: No thyroid disease, no DM, no heat intolerance, no cold intolerance, no polyuria, no polydipsia  Skin: No rashes, no skin lesions.  Heme: No easy bruising, no history of blood diseases.  Past Medical History Past Medical History  Diagnosis Date  . Anemia, iron deficiency 08/07/2011  . Angiodysplasia of intestinal tract 08/07/2011  . Diabetes mellitus   . Hypertension   . Arthritis   . Hx of  gastroesophageal reflux (GERD)   . Diverticulitis   . Phlebitis   . Macular degeneration   . TIA (transient ischemic attack)   . MI (myocardial infarction)   . Hiatal hernia   . AVM (arteriovenous malformation) of colon with hemorrhage   . Varicose veins of legs   . Cataract   . GERD (gastroesophageal reflux disease)      Past Surgical History Past Surgical History  Procedure Date  . Carotid artery - subclavian artery bypass graft   . Abdominal hysterectomy   . Hemorrhoid surgery      Social History: History   Social History  . Marital Status: Widowed    Spouse Name: N/A    Number of Children: 4  . Years of Education: N/A   Occupational History  . Retired Midwife    Social History Main Topics  . Smoking status: Former Smoker    Quit date: 07/23/1990  . Smokeless tobacco: Not on file  . Alcohol Use: No  . Drug Use: No  . Sexually Active: Not on file   Other Topics Concern  . Not on file   Social History Narrative   Widowed.  Lives alone.  Normally able to ambulate without assistance.    Family History:  Family History  Problem Relation Age of Onset  . Colon cancer Sister   . Coronary artery disease Father     Allergies: Moxifloxacin; Sulfa antibiotics; and Ciprofloxacin  Meds: Prior to Admission medications   Medication Sig Start Date End Date Taking? Authorizing Provider  ALPRAZolam Prudy Feeler) 0.25 MG tablet Take 0.25  mg by mouth at bedtime as needed. ANXIETY.   Yes Historical Provider, MD  aspirin EC 81 MG tablet Take 81 mg by mouth daily.   Yes Historical Provider, MD  atenolol (TENORMIN) 100 MG tablet Take 100 mg by mouth daily.     Yes Historical Provider, MD  bumetanide (BUMEX) 0.5 MG tablet Take 0.5 mg by mouth continuous as needed. Edema   Yes Historical Provider, MD  cholecalciferol (VITAMIN D) 1000 UNITS tablet Take 1,000 Units by mouth daily.   Yes Historical Provider, MD  cyclobenzaprine (FLEXERIL) 10 MG tablet Take 10 mg by mouth 2 (two)  times daily as needed. MUSCLE SPASM.   Yes Eldred Manges, MD  NITROSTAT 0.4 MG SL tablet Place 0.4 mg under the tongue.  07/05/11  Yes Historical Provider, MD  omeprazole (PRILOSEC) 40 MG capsule Take 40 mg by mouth 2 (two) times daily.    Yes Historical Provider, MD  triamcinolone cream (KENALOG) 0.1 % Apply 1 application topically as needed. Eczema 07/09/12  Yes Historical Provider, MD    Physical Exam: Filed Vitals:   08/29/12 1000 08/29/12 1001 08/29/12 1002 08/29/12 1227  BP: 150/69 146/88 138/61 109/61  Pulse: 86 88 82 81  Temp:    98.6 F (37 C)  TempSrc:    Oral  Resp:    24  SpO2:    93%     Physical Exam: Blood pressure 109/61, pulse 81, temperature 98.6 F (37 C), temperature source Oral, resp. rate 24, SpO2 93.00%. Gen: Head: Eyes:  Mouth: Neck: Chest: CV: Abdomen: Extremities: Skin: Neuro: Psych:   Labs on Admission:  Basic Metabolic Panel:  Lab 08/29/12 9562  NA 140  K 4.2  CL 106  CO2 23  GLUCOSE 138*  BUN 30*  CREATININE 0.81  CALCIUM 10.4  MG --  PHOS --   Liver Function Tests:  Lab 08/29/12 1005  AST 46*  ALT 30  ALKPHOS 122*  BILITOT 0.9  PROT 6.8  ALBUMIN 3.0*   CBC:  Lab 08/29/12 1005  WBC 9.1  NEUTROABS 5.8  HGB 13.5  HCT 40.0  MCV 91.1  PLT 194    BNP (last 3 results)  Basename 12/06/11 2000  PROBNP 79.6   CBG: No results found for this basename: GLUCAP:5 in the last 168 hours  Radiological Exams on Admission: Dg Abd Acute W/chest  08/29/2012  *RADIOLOGY REPORT*  Clinical Data: Left lower quadrant pain.  History of diverticulitis.  ACUTE ABDOMEN SERIES (ABDOMEN 2 VIEW & CHEST 1 VIEW)  Comparison: Plain film chest 12/06/2011.  CT abdomen and pelvis 03/15/2009.  Findings: Single view of the chest demonstrates mild basilar atelectasis.  There may be a trace pleural effusion on the right. No pneumothorax.  Heart size is upper normal.  Hiatal hernia is noted.  Two views of the abdomen show no free intraperitoneal air.   The bowel gas pattern is unremarkable.  Convex left thoracolumbar scoliosis and multilevel degenerative change are noted.  IMPRESSION:  1.  No acute finding chest or abdomen. 2.  Hiatal hernia.   Original Report Authenticated By: Holley Dexter, M.D.     Assessment/Plan Principal Problem:  *Melena / hematemesis   Admit and monitor CBC q. 8 hours. We'll place on continuous PPI therapy given her episode of hematemesis. Consult Dr. Ewing Schlein for possible repeat upper endoscopy. Hold aspirin. Type and screen in case transfusion needed. Place 2 large-bore IVs.  Source likely upper given elevated BUN. May be ulcerations from aspirin use versus AVM.  Active Problems:  Hypertension  Hold Bumex. May continue atenolol as blood pressure allows.  Diabetes mellitus  Most recent hemoglobin A1c 5.8. Not on any diabetes medications.  Coronary artery disease  Hold aspirin, continue beta blocker as blood pressure tolerates.  Code Status: Full Family Communication: Granddaughter at bedside. Disposition Plan: Home when stable.  Time spent: 1 hour.  Indie Boehne [facility] Pager [phone]  If 7PM-7AM, please contact night-coverage [email or web address] Password [email or web address] 08/29/2012, 1:45 PM

## 2012-08-29 NOTE — ED Provider Notes (Signed)
History     CSN: [id number]  Arrival date & time 08/29/12  4098   First MD Initiated Contact with Patient 08/29/12 [phone]      Chief Complaint  Patient presents with  . Melena  . Dizziness    (Consider location/radiation/quality/duration/timing/severity/associated sxs/prior treatment) HPI Comments: Patient p/w generalized weakness x 1 week and dark tarry stools x 10 since midnight last night.  Stool is unformed.  Has had associated abdominal bloating and tightness.  Denies fevers, chills, myalgias, N/V, urinary symptoms.  Past abdominal surgeries: cesarean section and hysterectomy.  PMH: gastrointestinal angiodysplasia, iron deficiency anemia, hiatal hernia, gerd, esophageal dilation.  Pt is on a daily aspirin.   The history is provided by the patient and a relative.    Past Medical History  Diagnosis Date  . Anemia, iron deficiency 08/07/2011  . Angiodysplasia of intestinal tract 08/07/2011  . Diabetes mellitus   . Hypertension   . Arthritis   . Hx of gastroesophageal reflux (GERD)   . Diverticulitis   . Phlebitis   . Macular degeneration   . TIA (transient ischemic attack)   . MI (myocardial infarction)   . Hiatal hernia     Past Surgical History  Procedure Date  . Carotid artery - subclavian artery bypass graft   . Abdominal hysterectomy   . Hemorrhoid surgery     Family History  Problem Relation Age of Onset  . Colon cancer    . Coronary artery disease      History  Substance Use Topics  . Smoking status: Former Games developer  . Smokeless tobacco: Not on file  . Alcohol Use: No    OB History    Grav Para Term Preterm Abortions TAB SAB Ect Mult Living                  Review of Systems  Constitutional: Negative for fever and chills.  Respiratory: Negative for shortness of breath.   Cardiovascular: Negative for chest pain.  Gastrointestinal: Positive for abdominal pain, diarrhea and abdominal distention. Negative for nausea and vomiting.  Genitourinary:  Negative for dysuria, urgency and frequency.  Neurological: Positive for weakness. Negative for dizziness, light-headedness and numbness.  All other systems reviewed and are negative.    Allergies  Moxifloxacin; Sulfa antibiotics; and Ciprofloxacin  Home Medications   Current Outpatient Rx  Name  Route  Sig  Dispense  Refill  . ALPRAZOLAM 0.25 MG PO TABS   Oral   Take 0.25 mg by mouth at bedtime as needed. ANXIETY.         . ASPIRIN 325 MG PO TABS   Oral   Take 1 tablet (325 mg total) by mouth daily.           Enteric coated   . ATENOLOL 100 MG PO TABS   Oral   Take 100 mg by mouth daily.           . BUMETANIDE 0.5 MG PO TABS   Oral   Take 0.5 mg by mouth continuous as needed.          Marland Kitchen VITAMIN D 1000 UNITS PO TABS   Oral   Take 1,000 Units by mouth daily.         . CYCLOBENZAPRINE HCL 10 MG PO TABS   Oral   Take 10 mg by mouth 2 (two) times daily as needed. MUSCLE SPASM.         Marland Kitchen NITROSTAT 0.4 MG SL SUBL   Sublingual   Place  0.4 mg under the tongue.          Marland Kitchen OMEPRAZOLE 40 MG PO CPDR   Oral   Take 40 mg by mouth 2 (two) times daily.          . TRIAMCINOLONE ACETONIDE 0.1 % EX CREA   Topical   Apply 1 application topically as needed.            BP 138/61  Pulse 82  Physical Exam  Nursing note and vitals reviewed. Constitutional: She appears well-developed and well-nourished. No distress.  HENT:  Head: Normocephalic and atraumatic.  Neck: Neck supple.  Cardiovascular: Normal rate and regular rhythm.   Pulmonary/Chest: Effort normal and breath sounds normal. No respiratory distress. She has no wheezes. She has no rales.  Abdominal: Soft. She exhibits no distension. There is tenderness. There is no rigidity, no rebound, no guarding and no CVA tenderness.         Mild left suprapubic tenderness  Genitourinary: Rectal exam shows no external hemorrhoid, no internal hemorrhoid, no mass, no tenderness and anal tone normal. Guaiac  positive stool.       Rectal exam: Dark black stool on glove   Neurological: She is alert.  Skin: She is not diaphoretic.    ED Course  Procedures (including critical care time)  Labs Reviewed  COMPREHENSIVE METABOLIC PANEL - Abnormal; Notable for the following:    Glucose, Bld 138 (*)     BUN 30 (*)     Albumin 3.0 (*)     AST 46 (*)     Alkaline Phosphatase 122 (*)     GFR calc non Af Amer 66 (*)     GFR calc Af Amer 76 (*)     All other components within normal limits  CBC WITH DIFFERENTIAL - Abnormal; Notable for the following:    Monocytes Absolute 1.1 (*)     All other components within normal limits  URINALYSIS, ROUTINE W REFLEX MICROSCOPIC - Abnormal; Notable for the following:    APPearance CLOUDY (*)     Leukocytes, UA SMALL (*)     All other components within normal limits  URINE MICROSCOPIC-ADD ON - Abnormal; Notable for the following:    Squamous Epithelial / LPF MANY (*)     Bacteria, UA FEW (*)     All other components within normal limits  TYPE AND SCREEN  OCCULT BLOOD, POC DEVICE  URINE CULTURE  ABO/RH   Dg Abd Acute W/chest  08/29/2012  *RADIOLOGY REPORT*  Clinical Data: Left lower quadrant pain.  History of diverticulitis.  ACUTE ABDOMEN SERIES (ABDOMEN 2 VIEW & CHEST 1 VIEW)  Comparison: Plain film chest 12/06/2011.  CT abdomen and pelvis 03/15/2009.  Findings: Single view of the chest demonstrates mild basilar atelectasis.  There may be a trace pleural effusion on the right. No pneumothorax.  Heart size is upper normal.  Hiatal hernia is noted.  Two views of the abdomen show no free intraperitoneal air.  The bowel gas pattern is unremarkable.  Convex left thoracolumbar scoliosis and multilevel degenerative change are noted.  IMPRESSION:  1.  No acute finding chest or abdomen. 2.  Hiatal hernia.   Original Report Authenticated By: Holley Dexter, M.D.     10:26 AM Discussed patient with Dr Patria Mane.   12:26 PM Patient reports improvement of pain, states  she felt the "bloating" feeling in her upper abdomen that improved with morphine.  Reexamination of abdomen: soft, nondistended, now diffuse tenderness worse in upper abdomen,  no guarding, no rebound.   1. Melena   2. Generalized weakness   3. Dehydration     MDM  Pt with hx gastrointestinal angiodysplasia and various upper abdominal problems, on a daily aspirin, p/w 10-15 episodes of melena since 12am today and generalized weakness.  Hgb 13.5 though patient is dehydrated (BUN creat 30 and 0.8).  Abdomen is nonsurgical.  Xray negative. Labs otherwise unremarkable.  No further episodes of melena in ED prior to admission.  Admitted to [facility] observation.          [place], [place] 08/29/12 1324

## 2012-08-29 NOTE — Consult Note (Signed)
Reason for Consult: GI bleeding Referring Physician: Hospital team  [name] is an 78 y.o. female.  HPI: Patient well known to me from a long history of colonoscopies for polyps and hiatal hernia and ring and dysphagia and multiple dilations in the past who has had anemia in the past secondary to her hiatal hernia and blood thinners who was in her normal state of health until last night when she began having some lower abdominal pain and black diarrhea but no fever and no at risk meals and no sick contacts was last last endoscopy and colonoscopy was 3 years ago which we reviewed and since he's been here her diarrhea has been better but she has had some coffee-ground emesis and she has been on aspirin lately but no other new medicines and no new complaints Past Medical History  Diagnosis Date  . Anemia, iron deficiency 08/07/2011  . Angiodysplasia of intestinal tract 08/07/2011  . Diabetes mellitus   . Hypertension   . Arthritis   . Hx of gastroesophageal reflux (GERD)   . Diverticulitis   . Phlebitis   . Macular degeneration   . TIA (transient ischemic attack)   . MI (myocardial infarction)   . Hiatal hernia   . AVM (arteriovenous malformation) of colon with hemorrhage   . Varicose veins of legs   . Cataract   . GERD (gastroesophageal reflux disease)     Past Surgical History  Procedure Date  . Carotid artery - subclavian artery bypass graft   . Abdominal hysterectomy   . Hemorrhoid surgery     Family History  Problem Relation Age of Onset  . Colon cancer Sister   . Coronary artery disease Father     Social History:  reports that she quit smoking about 22 years ago. She does not have any smokeless tobacco history on file. She reports that she does not drink alcohol or use illicit drugs.  Allergies:  Allergies  Allergen Reactions  . Moxifloxacin   . Sulfa Antibiotics   . Ciprofloxacin Other (See Comments)    Complains of sore mouth    Medications: I have reviewed  the patient's current medications.  Results for orders placed during the hospital encounter of 08/29/12 (from the past 48 hour(s))  TYPE AND SCREEN     Status: Normal   Collection Time   08/29/12  9:51 AM      Component Value Range Comment   ABO/RH(D) O NEG      Antibody Screen NEG      Sample Expiration 09/01/2012     ABO/RH     Status: Normal   Collection Time   08/29/12  9:51 AM      Component Value Range Comment   ABO/RH(D) O NEG     COMPREHENSIVE METABOLIC PANEL     Status: Abnormal   Collection Time   08/29/12 10:05 AM      Component Value Range Comment   Sodium 140  135 - 145 mEq/L    Potassium 4.2  3.5 - 5.1 mEq/L    Chloride 106  96 - 112 mEq/L    CO2 23  19 - 32 mEq/L    Glucose, Bld 138 (*) 70 - 99 mg/dL    BUN 30 (*) 6 - 23 mg/dL    Creatinine, Ser 6.21  0.50 - 1.10 mg/dL    Calcium 30.8  8.4 - 10.5 mg/dL    Total Protein 6.8  6.0 - 8.3 g/dL    Albumin 3.0 (*)  3.5 - 5.2 g/dL    AST 46 (*) 0 - 37 U/L    ALT 30  0 - 35 U/L    Alkaline Phosphatase 122 (*) 39 - 117 U/L    Total Bilirubin 0.9  0.3 - 1.2 mg/dL    GFR calc non Af Amer 66 (*) >90 mL/min    GFR calc Af Amer 76 (*) >90 mL/min   CBC WITH DIFFERENTIAL     Status: Abnormal   Collection Time   08/29/12 10:05 AM      Component Value Range Comment   WBC 9.1  4.0 - 10.5 K/uL    RBC 4.39  3.87 - 5.11 MIL/uL    Hemoglobin 13.5  12.0 - 15.0 g/dL    HCT 16.1  09.6 - 04.5 %    MCV 91.1  78.0 - 100.0 fL    MCH 30.8  26.0 - 34.0 pg    MCHC 33.8  30.0 - 36.0 g/dL    RDW 40.9  81.1 - 91.4 %    Platelets 194  150 - 400 K/uL    Neutrophils Relative 64  43 - 77 %    Neutro Abs 5.8  1.7 - 7.7 K/uL    Lymphocytes Relative 24  12 - 46 %    Lymphs Abs 2.2  0.7 - 4.0 K/uL    Monocytes Relative 12  3 - 12 %    Monocytes Absolute 1.1 (*) 0.1 - 1.0 K/uL    Eosinophils Relative 0  0 - 5 %    Eosinophils Absolute 0.0  0.0 - 0.7 K/uL    Basophils Relative 0  0 - 1 %    Basophils Absolute 0.0  0.0 - 0.1 K/uL   OCCULT BLOOD,  POC DEVICE     Status: Normal   Collection Time   08/29/12 10:20 AM      Component Value Range Comment   Fecal Occult Bld POSITIVE     URINALYSIS, ROUTINE W REFLEX MICROSCOPIC     Status: Abnormal   Collection Time   08/29/12 11:14 AM      Component Value Range Comment   Color, Urine YELLOW  YELLOW    APPearance CLOUDY (*) CLEAR    Specific Gravity, Urine 1.023  1.005 - 1.030    pH 6.0  5.0 - 8.0    Glucose, UA NEGATIVE  NEGATIVE mg/dL    Hgb urine dipstick NEGATIVE  NEGATIVE    Bilirubin Urine NEGATIVE  NEGATIVE    Ketones, ur NEGATIVE  NEGATIVE mg/dL    Protein, ur NEGATIVE  NEGATIVE mg/dL    Urobilinogen, UA 0.2  0.0 - 1.0 mg/dL    Nitrite NEGATIVE  NEGATIVE    Leukocytes, UA SMALL (*) NEGATIVE   URINE MICROSCOPIC-ADD ON     Status: Abnormal   Collection Time   08/29/12 11:14 AM      Component Value Range Comment   Squamous Epithelial / LPF MANY (*) RARE    WBC, UA 0-2  <3 WBC/hpf    Bacteria, UA FEW (*) RARE    Urine-Other MUCOUS PRESENT     GLUCOSE, CAPILLARY     Status: Abnormal   Collection Time   08/29/12  2:51 PM      Component Value Range Comment   Glucose-Capillary 118 (*) 70 - 99 mg/dL   CBC     Status: Abnormal   Collection Time   08/29/12  3:05 PM      Component Value Range Comment  WBC 8.4  4.0 - 10.5 K/uL    RBC 3.96  3.87 - 5.11 MIL/uL    Hemoglobin 12.1  12.0 - 15.0 g/dL    HCT 16.1  09.6 - 04.5 %    MCV 92.2  78.0 - 100.0 fL    MCH 30.6  26.0 - 34.0 pg    MCHC 33.2  30.0 - 36.0 g/dL    RDW 40.9  81.1 - 91.4 %    Platelets 149 (*) 150 - 400 K/uL   APTT     Status: Normal   Collection Time   08/29/12  3:05 PM      Component Value Range Comment   aPTT 34  24 - 37 seconds   PROTIME-INR     Status: Abnormal   Collection Time   08/29/12  3:05 PM      Component Value Range Comment   Prothrombin Time 16.3 (*) 11.6 - 15.2 seconds    INR 1.34  0.00 - 1.49     Dg Abd Acute W/chest  08/29/2012  *RADIOLOGY REPORT*  Clinical Data: Left lower quadrant pain.   History of diverticulitis.  ACUTE ABDOMEN SERIES (ABDOMEN 2 VIEW & CHEST 1 VIEW)  Comparison: Plain film chest 12/06/2011.  CT abdomen and pelvis 03/15/2009.  Findings: Single view of the chest demonstrates mild basilar atelectasis.  There may be a trace pleural effusion on the right. No pneumothorax.  Heart size is upper normal.  Hiatal hernia is noted.  Two views of the abdomen show no free intraperitoneal air.  The bowel gas pattern is unremarkable.  Convex left thoracolumbar scoliosis and multilevel degenerative change are noted.  IMPRESSION:  1.  No acute finding chest or abdomen. 2.  Hiatal hernia.   Original Report Authenticated By: Holley Dexter, M.D.     ROS negative except above Blood pressure 109/61, pulse 81, temperature 98.6 F (37 C), temperature source Oral, resp. rate 24, SpO2 93.00%. Physical Exam vital signs stable afebrile lungs are clear heart regular rate and rhythm abdomen is soft nontender no pedal edema labs reviewed  Assessment/Plan: Melena in a patient on aspirin Plan: We'll proceed with panendoscopy when necessary or tomorrow morning and will allow clear liquids with further workup and plans pending above and we did discuss with her and her family and the hospital team how she has a very difficult tortuous colon and would be very careful if a colonoscopy is needed and would probably want to orders the ultraslim scope  Wynona Duhamel E 08/29/2012, 3:44 PM

## 2012-08-29 NOTE — ED Notes (Signed)
Pt denies diarrhea. States has had 10-12 black stools since early am. Pt reports having a nose bleed several days ago. Denies difficulty getting nose bleed to stop. Denies hematuria or bloody vag DC.

## 2012-08-30 ENCOUNTER — Encounter (HOSPITAL_COMMUNITY)
Admission: EM | Disposition: A | Discharge status: Home Health | Payer: Self-pay | Source: Home / Self Care | Attending: Family Medicine

## 2012-08-30 ENCOUNTER — Encounter (HOSPITAL_COMMUNITY): Payer: Self-pay | Admitting: Gastroenterology

## 2012-08-30 ENCOUNTER — Observation Stay (HOSPITAL_COMMUNITY): Payer: Medicare Other

## 2012-08-30 HISTORY — PX: ESOPHAGOGASTRODUODENOSCOPY: SHX5428

## 2012-08-30 LAB — BASIC METABOLIC PANEL
BUN: 39 mg/dL — ABNORMAL HIGH (ref 6–23)
Chloride: 115 mEq/L — ABNORMAL HIGH (ref 96–112)
GFR calc Af Amer: 88 mL/min — ABNORMAL LOW (ref >90)
Glucose, Bld: 103 mg/dL — ABNORMAL HIGH (ref 70–99)
Potassium: 4.2 mEq/L (ref 3.5–5.1)
Sodium: 143 mEq/L (ref 135–145)

## 2012-08-30 LAB — URINE CULTURE: Culture: NO GROWTH

## 2012-08-30 LAB — CBC
MCH: 30.7 pg (ref 26.0–34.0)
MCHC: 32.8 g/dL (ref 30.0–36.0)
MCV: 93.6 fL (ref 78.0–100.0)
Platelets: 133 10*3/uL — ABNORMAL LOW (ref 150–400)
RDW: 13.4 % (ref 11.5–15.5)
WBC: 8.9 10*3/uL (ref 4.0–10.5)

## 2012-08-30 SURGERY — EGD (ESOPHAGOGASTRODUODENOSCOPY)
Anesthesia: Moderate Sedation

## 2012-08-30 MED ORDER — IOHEXOL 300 MG/ML  SOLN
100.0000 mL | Freq: Once | INTRAMUSCULAR | Status: AC | PRN
  Administered 2012-08-30: 100 mL via INTRAVENOUS

## 2012-08-30 MED ORDER — BUTAMBEN-TETRACAINE-BENZOCAINE 2-2-14 % EX AERO
INHALATION_SPRAY | CUTANEOUS | Status: DC | PRN
  Administered 2012-08-30: 2 via TOPICAL

## 2012-08-30 MED ORDER — MIDAZOLAM HCL 10 MG/2ML IJ SOLN
INTRAMUSCULAR | Status: DC | PRN
  Administered 2012-08-30 (×2): 2 mg via INTRAVENOUS

## 2012-08-30 MED ORDER — SUCRALFATE 1 GM/10ML PO SUSP
1.0000 g | Freq: Three times a day (TID) | ORAL | Status: DC
  Administered 2012-08-30 – 2012-09-05 (×25): 1 g via ORAL
  Filled 2012-08-30 (×29): qty 10

## 2012-08-30 MED ORDER — FENTANYL CITRATE 0.05 MG/ML IJ SOLN
INTRAMUSCULAR | Status: DC | PRN
  Administered 2012-08-30 (×2): 25 ug via INTRAVENOUS

## 2012-08-30 MED ORDER — PANTOPRAZOLE SODIUM 40 MG PO TBEC
40.0000 mg | DELAYED_RELEASE_TABLET | Freq: Two times a day (BID) | ORAL | Status: DC
  Administered 2012-08-30 – 2012-09-05 (×12): 40 mg via ORAL
  Filled 2012-08-30 (×15): qty 1

## 2012-08-30 NOTE — Progress Notes (Signed)
[facility] PROGRESS NOTE  DRAKE LANDING ZOX:[id number] DOB: 27-Jul-1928 DOA: 08/29/2012 PCP: Nadean Corwin, MD  Brief narrative: [name] is an 78 y.o. female with a PMH of hiatal hernia and esophageal ring requiring Savary dilatation and diverticulosis with a tortuous colon on prior colonoscopy, under the care of Dr. Ewing Schlein, who presented with a chief complaint of melanotic stools. Taken for upper endoscopy on 08/30/2012 and Sheria Lang lesions secondary to her hiatal hernia were felt to be the cause of the bleeding.  Assessment/Plan: Principal Problem:  *Melena / hematemesis   Admitted and monitoring CBC q. 8 hours. PPI changed to oral dosing, twice a day. Seen by Dr. 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 Continue to hold aspirin. Type and screen done in case transfusion needed. Has 2 large-bore IVs. Hemoglobin drop of 3 g noted since admission, currently 10.6 mg/dL.  For CT angiogram of the mesenteric vessels to exclude ischemic colitis as the source of her symptoms. Active Problems:  Hypertension   Hold Bumex. May continue atenolol as blood pressure allows. Diabetes mellitus   Most recent hemoglobin A1c 5.8. Not on any diabetes medications. Coronary artery disease   Holding aspirin, continue beta blocker as blood pressure tolerates.   Code Status: Full  Family Communication: No family at bedside.  Disposition Plan: Home when stable.  Medical Consultants:  Dr. Vida Rigger, gastroenterology  Other Consultants:  None.  Anti-infectives:  None.  HPI/Subjective: The patient reports intermittent sharp abdominal pain and passage of gas which relieves the pain. No nausea or vomiting overnight. One black stool overnight.  Objective: Filed Vitals:   08/29/12 1227 08/29/12 1700 08/29/12 2155 08/30/12 0500  BP: 109/61  137/70 113/64  Pulse: 81  82 75  Temp: 98.6 F (37 C)  98.2 F (36.8 C)  98.8 F (37.1 C)  TempSrc: Oral  Oral Oral  Resp: 24  18 18   Height:  5\' 6"  (1.676 m)    Weight:  82.101 kg (181 lb)    SpO2: 93%  97% 93%    Intake/Output Summary (Last 24 hours) at 08/30/12 0834 Last data filed at 08/30/12 0600  Gross per 24 hour  Intake 7957.08 ml  Output     50 ml  Net 7907.08 ml    Exam: Gen:  NAD Cardiovascular:  RRR, No M/R/G Respiratory:  Lungs CTAB Gastrointestinal:  Abdomen soft, NT/ND, + BS Extremities:  No C/E/C  Data Reviewed: Basic Metabolic Panel:  Lab 08/30/12 8119 08/29/12 1005  NA 143 140  K 4.2 4.2  CL 115* 106  CO2 21 23  GLUCOSE 103* 138*  BUN 39* 30*  CREATININE 0.77 0.81  CALCIUM 9.1 10.4  MG -- --  PHOS -- --   GFR Estimated Creatinine Clearance: 58.5 ml/min (by C-G formula based on Cr of 0.77). Liver Function Tests:  Lab 08/29/12 1005  AST 46*  ALT 30  ALKPHOS 122*  BILITOT 0.9  PROT 6.8  ALBUMIN 3.0*   Coagulation profile  Lab 08/29/12 1505  INR 1.34  PROTIME --    CBC:  Lab 08/30/12 0531 08/29/12 2230 08/29/12 1505 08/29/12 1005  WBC 8.9 8.8 8.4 9.1  NEUTROABS -- -- -- 5.8  HGB 10.6* 11.5* 12.1 13.5  HCT 32.3* 34.2* 36.5 40.0  MCV 93.6 92.4 92.2 91.1  PLT 133* 149* 149* 194   BNP (last 3 results)  Basename 12/06/11 2000  PROBNP 79.6   CBG:  Lab 08/29/12 1451  GLUCAP 118*    Procedures and Diagnostic Studies: Dg Abd Acute W/chest  08/29/2012  *RADIOLOGY REPORT*  Clinical Data: Left lower quadrant pain.  History of diverticulitis.  ACUTE ABDOMEN SERIES (ABDOMEN 2 VIEW & CHEST 1 VIEW)  Comparison: Plain film chest 12/06/2011.  CT abdomen and pelvis 03/15/2009.  Findings: Single view of the chest demonstrates mild basilar atelectasis.  There may be a trace pleural effusion on the right. No pneumothorax.  Heart size is upper normal.  Hiatal hernia is noted.  Two views of the abdomen show no free intraperitoneal air.  The bowel gas pattern is unremarkable.  Convex left thoracolumbar scoliosis and  multilevel degenerative change are noted.  IMPRESSION:  1.  No acute finding chest or abdomen. 2.  Hiatal hernia.   Original Report Authenticated By: Holley Dexter, M.D.     Scheduled Meds:   . sodium chloride   Intravenous STAT  . atenolol  100 mg Oral Daily  . cholecalciferol  1,000 Units Oral Daily  . [COMPLETED]  morphine injection  4 mg Intravenous Once  . [COMPLETED] ondansetron (ZOFRAN) IV  4 mg Intravenous Once  . [COMPLETED] sodium chloride  1,000 mL Intravenous Once   Continuous Infusions:   . sodium chloride 75 mL/hr at 08/30/12 0121  . sodium chloride    . pantoprozole (PROTONIX) infusion 8 mg/hr (08/30/12 0120)    Time spent: 35 minutes.   LOS: 1 day   Soyla Bainter  [facility] Pager [phone].  If 8PM-8AM, please contact night-coverage at [email or web address], password [facility] 08/30/2012, 8:34 AM

## 2012-08-30 NOTE — Op Note (Signed)
[facility] [address] [place], [address]   ENDOSCOPY PROCEDURE REPORT  PATIENT: Lavergne, Hiltunen  MR#: [id number] BIRTHDATE: 06-27-1928 , 84  yrs. old GENDER: Female  ENDOSCOPIST: Vida Rigger, MD REFERRED BY:  PROCEDURE DATE:  08/30/2012 PROCEDURE:   EGD, diagnostic ASA CLASS:   Class II INDICATIONS:Melena.  MEDICATIONS: Fentanyl 50 mcg IV and Versed 4 mg IV  TOPICAL ANESTHETIC:used  DESCRIPTION OF PROCEDURE:   After the risks benefits and alternatives of the procedure were thoroughly explained, informed consent was obtained.  The Pentax Gastroscope [id number]  endoscope was introduced through the mouth and advanced to the third portion of the duodenum , limited by Without limitations.   The instrument was slowly withdrawn as the mucosa was fully examined.the findings are recorded below and there was no active bleeding and the patient tolerated the procedure well and there was no obvious immediate complication         FINDINGS:1. Moderate size hiatal with widely patent proximal right 2. Tortuous distal esophagus 3. Moderate Cameron lesion probable cause of bleeding 4 otherwise within normal limits to the third part of the duodenum COMPLICATIONS:none  ENDOSCOPIC IMPRESSION: bleeding probably from [place] lesions secondary to her moderate-sized hiatal hernia   RECOMMENDATIONS:continue twice a day pump inhibitors minimize aspirin as much is possible and will proceed with CT because of her abdominal pain rule out ischemic colitis   REPEAT EXAM: as needed   _______________________________ Vida Rigger, MD eSigned:  Vida Rigger, MD 08/30/2012 9:37 AM    CC:  PATIENT NAME:  Domino, Holten MR#: [id number]

## 2012-08-30 NOTE — Progress Notes (Signed)
[name] 9:01 AM  Subjective: Patient with only 1 black bowel movement over night and her pain is better and her nausea and vomiting is better and she has no new complaints  Objective: Vital signs stable afebrile no acute distress exam please see pre-assessment evaluation hemoglobin but slight increase in BUN  Assessment: GI bleed however lower pain questionable etiology possibly ischemic colitis  Plan: Proceed with an endoscopy this morning and if no signs of bleeding probably a CAT scan and we discussed with the patient and the family again about the hesitancy and risks of repeating colonoscopy  [facility] E

## 2012-08-30 NOTE — Brief Op Note (Signed)
Report called to [facility].

## 2012-08-31 ENCOUNTER — Inpatient Hospital Stay (HOSPITAL_COMMUNITY): Payer: Medicare Other

## 2012-08-31 ENCOUNTER — Encounter (HOSPITAL_COMMUNITY): Payer: Self-pay | Admitting: Gastroenterology

## 2012-08-31 DIAGNOSIS — K7460 Unspecified cirrhosis of liver: Secondary | ICD-10-CM | POA: Diagnosis present

## 2012-08-31 DIAGNOSIS — K766 Portal hypertension: Secondary | ICD-10-CM | POA: Diagnosis present

## 2012-08-31 DIAGNOSIS — R5383 Other fatigue: Secondary | ICD-10-CM | POA: Insufficient documentation

## 2012-08-31 LAB — CBC WITH DIFFERENTIAL/PLATELET
Basophils Absolute: 0 10*3/uL (ref 0.0–0.1)
Basophils Relative: 0 % (ref 0–1)
MCHC: 32.8 g/dL (ref 30.0–36.0)
Neutro Abs: 3 10*3/uL (ref 1.7–7.7)
Neutrophils Relative %: 41 % — ABNORMAL LOW (ref 43–77)
Platelets: 120 10*3/uL — ABNORMAL LOW (ref 150–400)
RDW: 13.6 % (ref 11.5–15.5)

## 2012-08-31 LAB — AFP TUMOR MARKER: AFP-Tumor Marker: 5.9 ng/mL (ref 0.0–8.0)

## 2012-08-31 LAB — BASIC METABOLIC PANEL
Chloride: 112 mEq/L (ref 96–112)
GFR calc Af Amer: 66 mL/min — ABNORMAL LOW (ref >90)
GFR calc non Af Amer: 57 mL/min — ABNORMAL LOW (ref >90)
Potassium: 3.6 mEq/L (ref 3.5–5.1)

## 2012-08-31 MED ORDER — GADOBENATE DIMEGLUMINE 529 MG/ML IV SOLN
17.0000 mL | Freq: Once | INTRAVENOUS | Status: AC | PRN
  Administered 2012-08-31: 17 mL via INTRAVENOUS

## 2012-08-31 MED ORDER — ALPRAZOLAM 0.5 MG PO TABS
0.5000 mg | ORAL_TABLET | ORAL | Status: AC
  Administered 2012-08-31: 0.5 mg via ORAL

## 2012-08-31 MED ORDER — LIP MEDEX EX OINT
TOPICAL_OINTMENT | CUTANEOUS | Status: AC
  Administered 2012-08-31: 09:00:00
  Filled 2012-08-31: qty 7

## 2012-08-31 MED ORDER — LIP MEDEX EX OINT: TOPICAL_OINTMENT | CUTANEOUS | Status: DC | PRN

## 2012-08-31 NOTE — Progress Notes (Signed)
[facility] PROGRESS NOTE  [name] AVW:[id number] DOB: 10/28/1927 DOA: 08/29/2012 PCP: Nadean Corwin, MD  Brief narrative: [name] is an 78 y.o. female with a PMH of hiatal hernia and esophageal ring requiring Savary dilatation and diverticulosis with a tortuous colon on prior colonoscopy, under the care of Dr. Ewing Schlein, who presented with a chief complaint of melanotic stools. Taken for upper endoscopy on 08/30/2012 and Sheria Lang lesions secondary to her hiatal hernia were felt to be the cause of the bleeding. For MRI today to evaluate for the possibility of ischemic colitis and to assess her liver.  Assessment/Plan: Principal Problem:  *Melena / hematemesis   Admitted and monitoring CBC q. 8 hours. PPI changed to oral dosing, twice a day. Seen by Dr. 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 Continue to hold aspirin. Type and screen done in case transfusion needed. Has 2 large-bore IVs. Hemoglobin drop of 3.5 g noted since admission, currently 9.9 mg/dL.  For MRI of the mesenteric vessels to exclude ischemic colitis as the source of her symptoms. Active Problems: Moderate malnutrition in the context of acute illness  Seen by dietitian 08/31/2012. Fatigue  Unclear etiology. Check vitamin B 12 and TSH. PT evaluation. Cirrhosis / portal hypertension  Found incidentally on CT scans of the abdomen and pelvis done 08/30/2012. Thought to be from nonalcoholic fatty liver disease. Also has an indeterminate liver lesion which will be evaluated by MRI today. No evidence of varices on endoscopy. Followup AFP. Hypertension   Hold Bumex. May continue atenolol as blood pressure allows. Diabetes mellitus   Most recent hemoglobin A1c 5.8. Not on any diabetes medications. Coronary artery disease   Holding aspirin, continue beta blocker as blood pressure tolerates.   Code Status: Full  Family Communication: Family  updated at bedside.  Disposition Plan: Home when stable.  Medical Consultants:  Dr. Vida Rigger, gastroenterology  Other Consultants:  Dietitian  Physical therapy  Anti-infectives:  None.  HPI/Subjective: The patient denies diarrhea. Her stools are soft. Abdominal pain has improved. She is very concerned about her newly diagnosed cirrhosis. She reports fatigue x several months.  Objective: Filed Vitals:   08/30/12 1409 08/30/12 2126 08/31/12 0545 08/31/12 0925  BP: 110/68 109/70 102/61 115/73  Pulse: 72 69 64 79  Temp: 97 F (36.1 C) 98.4 F (36.9 C) 97.7 F (36.5 C)   TempSrc:  Oral Oral   Resp: 18 18 18    Height:      Weight:      SpO2: 95% 97% 95%     Intake/Output Summary (Last 24 hours) at 08/31/12 1549 Last data filed at 08/31/12 1000  Gross per 24 hour  Intake   1740 ml  Output    600 ml  Net   1140 ml    Exam: Gen:  NAD Cardiovascular:  RRR, No M/R/G Respiratory:  Lungs CTAB Gastrointestinal:  Abdomen soft, NT/ND, + BS Extremities:  No C/E/C  Data Reviewed: Basic Metabolic Panel:  Lab 08/31/12 8295 08/30/12 0531 08/29/12 1005  NA 142 143 140  K 3.6 4.2 --  CL 112 115* 106  CO2 22 21 23   GLUCOSE 79 103* 138*  BUN 26* 39* 30*  CREATININE 0.91 0.77 0.81  CALCIUM 8.3* 9.1 10.4  MG -- -- --  PHOS -- -- --   GFR Estimated Creatinine Clearance: 51.5 ml/min (by C-G formula based on Cr of 0.91). Liver Function Tests:  Lab 08/29/12 1005  AST 46*  ALT 30  ALKPHOS 122*  BILITOT 0.9  PROT 6.8  ALBUMIN 3.0*   Coagulation profile  Lab 08/29/12 1505  INR 1.34  PROTIME --    CBC:  Lab 08/31/12 0435 08/30/12 0531 08/29/12 2230 08/29/12 1505 08/29/12 1005  WBC 7.3 8.9 8.8 8.4 9.1  NEUTROABS 3.0 -- -- -- 5.8  HGB 9.9* 10.6* 11.5* 12.1 13.5  HCT 30.2* 32.3* 34.2* 36.5 40.0  MCV 93.5 93.6 92.4 92.2 91.1  PLT 120* 133* 149* 149* 194   BNP (last 3 results)  Basename 12/06/11 2000  PROBNP 79.6   CBG:  Lab 08/29/12 1451  GLUCAP 118*     Procedures and Diagnostic Studies:  Dg Abd Acute W/chest 08/29/2012 IMPRESSION:  1.  No acute finding chest or abdomen. 2.  Hiatal hernia.   Original Report Authenticated By: Holley Dexter, M.D.     Ct Abdomen Pelvis W Contrast 08/30/2012 IMPRESSION:  1.  Cirrhosis with portal hypertension and portal venous collaterals.  No splenomegaly but there is a small amount of ascites. 2.  13 mm right hepatic lobe liver lesion inferiorly is indeterminate.  Recommend follow-up MRI examination and correlation with alpha-fetoprotein level.  3.  Diffuse colonic wall thickening and to a lesser extent the small bowel wall thickening.  Findings could suggest changes related to cirrhosis, ascites and low albumin.  An infectious or inflammatory process is also possible.   Original Report Authenticated By: Rudie Meyer, M.D.     Scheduled Meds:    . atenolol  100 mg Oral Daily  . cholecalciferol  1,000 Units Oral Daily  . [COMPLETED] lip balm      . pantoprazole  40 mg Oral BID AC  . sucralfate  1 g Oral TID WC & HS   Continuous Infusions:    . sodium chloride 75 mL/hr at 08/31/12 0505    Time spent: 35 minutes.   LOS: 2 days   [name]  [facility] Pager [phone].  If 8PM-8AM, please contact night-coverage at [email or web address], password [facility] 08/31/2012, 3:49 PM

## 2012-08-31 NOTE — Progress Notes (Signed)
INITIAL ADULT NUTRITION ASSESSMENT Date: 08/31/2012   Time: 12:54 PM Reason for Assessment: Nutrition risk   INTERVENTION:  - Diet advancement per MD.  - Strongly recommend MD order PT consult r/t pt's report of significant weakness for the past 2 months. Will monitor.   Pt meets criteria for moderate malnutrition of acute illness AEB <75% estimated energy intake for at least the past few months with 8.6% weight loss in the past month.   ASSESSMENT: Female 78 y.o.  Dx: Melena  Food/Nutrition Related Hx: Pt reports being weaker in the past 2-3 months r/t back pain. Pt reports typically snacking on foods throughout the day versus eating 3 meals/day. Family reports pt does not eat a lot of meat, mainly just vegetables. Pt reports because of back pain she cannot stand for long amounts of time to cook and reports her family brings in food for her. Noted pt's weight down 17 pounds in the past month. Pt had EGD yesterday showed moderate size hiatal hernia, tortuous distal esophagus, and moderate Cameron lesion probably causing her GI bleeding.   Hx:  Past Medical History  Diagnosis Date  . Anemia, iron deficiency 08/07/2011  . Angiodysplasia of intestinal tract 08/07/2011  . Diabetes mellitus   . Hypertension   . Arthritis   . Hx of gastroesophageal reflux (GERD)   . Diverticulitis   . Phlebitis   . Macular degeneration   . TIA (transient ischemic attack)   . MI (myocardial infarction)   . Hiatal hernia   . AVM (arteriovenous malformation) of colon with hemorrhage   . Varicose veins of legs   . Cataract   . GERD (gastroesophageal reflux disease)    Related Meds:  Scheduled Meds:   . [EXPIRED] sodium chloride   Intravenous STAT  . atenolol  100 mg Oral Daily  . cholecalciferol  1,000 Units Oral Daily  . [COMPLETED] lip balm      . pantoprazole  40 mg Oral BID AC  . sucralfate  1 g Oral TID WC & HS   Continuous Infusions:   . sodium chloride 75 mL/hr at 08/31/12 0505   PRN  Meds:.acetaminophen, acetaminophen, ALPRAZolam, alum & mag hydroxide-simeth, [COMPLETED] iohexol, lip balm, morphine injection, ondansetron, oxyCODONE, promethazine  Ht: 5\' 6"  (167.6 cm)  Wt: 181 lb (82.101 kg)  Ideal Wt: 130 lb % Ideal Wt: 139  Usual Wt: 198 lb in November 2013 % Usual Wt: 91  Wt Readings from Last 10 Encounters:  08/29/12 181 lb (82.101 kg)  08/29/12 181 lb (82.101 kg)  08/05/12 198 lb (89.812 kg)  01/29/12 189 lb (85.73 kg)  12/07/11 185 lb 6.5 oz (84.1 kg)  08/07/11 195 lb (88.451 kg)  01/28/11 190 lb (86.183 kg)    Body mass index is 29.21 kg/(m^2).   Labs: CMP     Component Value Date/Time   NA 142 08/31/2012 0435   K 3.6 08/31/2012 0435   CL 112 08/31/2012 0435   CO2 22 08/31/2012 0435   GLUCOSE 79 08/31/2012 0435   BUN 26* 08/31/2012 0435   CREATININE 0.91 08/31/2012 0435   CALCIUM 8.3* 08/31/2012 0435   PROT 6.8 08/29/2012 1005   ALBUMIN 3.0* 08/29/2012 1005   AST 46* 08/29/2012 1005   ALT 30 08/29/2012 1005   ALKPHOS 122* 08/29/2012 1005   BILITOT 0.9 08/29/2012 1005   GFRNONAA 57* 08/31/2012 0435   GFRAA 66* 08/31/2012 0435    Intake/Output Summary (Last 24 hours) at 08/31/12 1310 Last data filed at 08/31/12 1000  Gross per 24 hour  Intake   2280 ml  Output    600 ml  Net   1680 ml    Last BM - 12/9  Diet Order:  NPO   IVF:    sodium chloride Last Rate: 75 mL/hr at 08/31/12 0505    Estimated Nutritional Needs:   Kcal:1650-2050 Protein:70-85g Fluid:1.6-2L  NUTRITION DIAGNOSIS: -Inadequate oral intake (NI-2.1).  Status: Ongoing  RELATED TO: inability to eat  AS EVIDENCE BY: NPO  MONITORING/EVALUATION(Goals): Advance diet as tolerated to diabetic diet.   EDUCATION NEEDS: -Education needs addressed - briefly discussed small frequent bland meal nutrition therapy for hiatal hernia.   Dietitian #: [phone]  DOCUMENTATION CODES Per approved criteria  -Non-severe (moderate) malnutrition in the context of acute illness or injury     Marshall Cork 08/31/2012, 12:54 PM

## 2012-08-31 NOTE — Progress Notes (Signed)
Subjective: Less melena. Abdominal pain improving.  Soft stool, but no diarrhea.  Objective: Vital signs in last 24 hours: Temp:  [97 F (36.1 C)-98.4 F (36.9 C)] 97.7 F (36.5 C) (12/09 0545) Pulse Rate:  [64-171] 79  (12/09 0925) Resp:  [18-22] 18  (12/09 0545) BP: (102-115)/(46-73) 115/73 mmHg (12/09 0925) SpO2:  [82 %-97 %] 95 % (12/09 0545) Weight change:  Last BM Date: 08/31/12  PE: GEN:  NAD ABD:  Soft, non-tender, active bowel sounds  Lab Results: CBC    Component Value Date/Time   WBC 7.3 08/31/2012 0435   WBC 6.2 08/05/2012 1315   WBC 7.4 11/27/2007 1058   RBC 3.23* 08/31/2012 0435   RBC 4.52 11/27/2007 1058   HGB 9.9* 08/31/2012 0435   HGB 14.2 08/05/2012 1315   HGB 14.0 11/27/2007 1058   HCT 30.2* 08/31/2012 0435   HCT 40.8 08/05/2012 1315   HCT 39.9 11/27/2007 1058   PLT 120* 08/31/2012 0435   PLT 128* 08/05/2012 1315   PLT 262 11/27/2007 1058   MCV 93.5 08/31/2012 0435   MCV 92 08/05/2012 1315   MCV 88.3 11/27/2007 1058   MCH 30.7 08/31/2012 0435   MCH 32.0 08/05/2012 1315   MCH 31.0 11/27/2007 1058   MCHC 32.8 08/31/2012 0435   MCHC 34.8 08/05/2012 1315   MCHC 35.2 11/27/2007 1058   RDW 13.6 08/31/2012 0435   RDW 13.2 08/05/2012 1315   RDW 12.4 11/27/2007 1058   LYMPHSABS 3.1 08/31/2012 0435   LYMPHSABS 2.4 08/05/2012 1315   LYMPHSABS 2.6 11/27/2007 1058   MONOABS 0.6 08/31/2012 0435   MONOABS 0.6 11/27/2007 1058   EOSABS 0.5 08/31/2012 0435   EOSABS 0.2 08/05/2012 1315   EOSABS 0.1 11/27/2007 1058   BASOSABS 0.0 08/31/2012 0435   BASOSABS 0.0 08/05/2012 1315   BASOSABS 0.0 11/27/2007 1058   CMP     Component Value Date/Time   NA 142 08/31/2012 0435   K 3.6 08/31/2012 0435   CL 112 08/31/2012 0435   CO2 22 08/31/2012 0435   GLUCOSE 79 08/31/2012 0435   BUN 26* 08/31/2012 0435   CREATININE 0.91 08/31/2012 0435   CALCIUM 8.3* 08/31/2012 0435   PROT 6.8 08/29/2012 1005   ALBUMIN 3.0* 08/29/2012 1005   AST 46* 08/29/2012 1005   ALT 30 08/29/2012 1005   ALKPHOS 122* 08/29/2012  1005   BILITOT 0.9 08/29/2012 1005   GFRNONAA 57* 08/31/2012 0435   GFRAA 66* 08/31/2012 0435   Studies/Results: CT abd/pelvis:  Large and small bowel edema.  Liver changes consistent with cirrhosis and portal hypertension.  Indeterminate 13mm lesion in right lobe of liver.  Assessment:  1.  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  Improving. 2.  Anemia.  Likely from #1 above.  Hiatal hernia can also lead to anemia as well. 3.  Cirrhosis with portal hypertension; no evidence of varices on yesterday's endoscopy.  New finding for patient.  Suspect non-alcoholic fatty liver. 4.  Indeterminate liver lesion.  Plan:  1.  Advance diet as tolerated, after MRI is completed. 2.  Protonix 40 mg bid for the next 6 weeks, then triage to once-a-day as outpatient. 3.  Check alpha-fetoprotein level and MRI abdomen to assess liver lesion. 4.  Hopefully home in the next day or two, with likely outpatient follow-up of liver findings. 5.  Will follow.   [name] 08/31/2012, 9:45 AM

## 2012-09-01 ENCOUNTER — Encounter (HOSPITAL_COMMUNITY): Payer: Self-pay | Admitting: Radiology

## 2012-09-01 LAB — TSH: TSH: 1.274 u[IU]/mL (ref 0.350–4.500)

## 2012-09-01 MED ORDER — ALBUTEROL SULFATE (5 MG/ML) 0.5% IN NEBU
2.5000 mg | INHALATION_SOLUTION | RESPIRATORY_TRACT | Status: DC | PRN
  Administered 2012-09-01 – 2012-09-02 (×3): 2.5 mg via RESPIRATORY_TRACT
  Filled 2012-09-01 (×5): qty 0.5

## 2012-09-01 MED ORDER — BUMETANIDE 0.5 MG PO TABS
0.5000 mg | ORAL_TABLET | Freq: Once | ORAL | Status: AC
  Administered 2012-09-01: 0.5 mg via ORAL
  Filled 2012-09-01: qty 1

## 2012-09-01 MED ORDER — ALPRAZOLAM 0.25 MG PO TABS
0.2500 mg | ORAL_TABLET | Freq: Three times a day (TID) | ORAL | Status: DC | PRN
  Administered 2012-09-01 – 2012-09-04 (×6): 0.25 mg via ORAL
  Filled 2012-09-01 (×6): qty 1

## 2012-09-01 NOTE — H&P (Signed)
Referring Physician:Outlaw HPI: [name] is an 78 y.o. female admitted with melena but found to have a new liver lesion concerning for malignant process. Dr. Dulce Sellar has requested biopsy of this lesion for tissue diagnosis. PMHx and meds reviewed as below. Pt very anxious about what lesion could be.  Past Medical History:  Past Medical History  Diagnosis Date  . Anemia, iron deficiency 08/07/2011  . Angiodysplasia of intestinal tract 08/07/2011  . Diabetes mellitus   . Hypertension   . Arthritis   . Hx of gastroesophageal reflux (GERD)   . Diverticulitis   . Phlebitis   . Macular degeneration   . TIA (transient ischemic attack)   . MI (myocardial infarction)   . Hiatal hernia   . AVM (arteriovenous malformation) of colon with hemorrhage   . Varicose veins of legs   . Cataract   . GERD (gastroesophageal reflux disease)     Past Surgical History:  Past Surgical History  Procedure Date  . Carotid artery - subclavian artery bypass graft   . Abdominal hysterectomy   . Hemorrhoid surgery   . Esophagogastroduodenoscopy 08/30/2012    Procedure: ESOPHAGOGASTRODUODENOSCOPY (EGD);  Surgeon: [name], MD;  Location: [facility];  Service: Endoscopy;  Laterality: N/A;    Family History:  Family History  Problem Relation Age of Onset  . Colon cancer Sister   . Coronary artery disease Father     Social History:  reports that she quit smoking about 22 years ago. She does not have any smokeless tobacco history on file. She reports that she does not drink alcohol or use illicit drugs.  Allergies:  Allergies  Allergen Reactions  . Moxifloxacin   . Sulfa Antibiotics   . Ciprofloxacin Other (See Comments)    Complains of sore mouth    Medications: Medications Prior to Admission  Medication Sig Dispense Refill  . ALPRAZolam (XANAX) 0.25 MG tablet Take 0.25 mg by mouth at bedtime as needed. ANXIETY.      Marland Kitchen aspirin EC 81 MG tablet Take 81 mg by mouth daily.      Marland Kitchen atenolol  (TENORMIN) 100 MG tablet Take 100 mg by mouth daily.        . bumetanide (BUMEX) 0.5 MG tablet Take 0.5 mg by mouth continuous as needed. Edema      . cholecalciferol (VITAMIN D) 1000 UNITS tablet Take 1,000 Units by mouth daily.      . cyclobenzaprine (FLEXERIL) 10 MG tablet Take 10 mg by mouth 2 (two) times daily as needed. MUSCLE SPASM.      Marland Kitchen NITROSTAT 0.4 MG SL tablet Place 0.4 mg under the tongue.       Marland Kitchen omeprazole (PRILOSEC) 40 MG capsule Take 40 mg by mouth 2 (two) times daily.       Marland Kitchen triamcinolone cream (KENALOG) 0.1 % Apply 1 application topically as needed. Eczema        Please HPI for pertinent positives, otherwise complete 10 system ROS negative.  Physical Exam: Blood pressure 123/76, pulse 69, temperature 98.3 F (36.8 C), temperature source Oral, resp. rate 18, height 5\' 6"  (1.676 m), weight 181 lb (82.101 kg), SpO2 98.00%. Body mass index is 29.21 kg/(m^2).   General Appearance:  Alert, cooperative, no distress, appears stated age  Head:  Normocephalic, without obvious abnormality, atraumatic  ENT: Unremarkable  Neck: Supple, symmetrical, trachea midline, no adenopathy, thyroid: not enlarged, symmetric, no tenderness/mass/nodules  Lungs:   Clear to auscultation bilaterally, no w/r/r, respirations unlabored without use of  accessory muscles.  Chest Wall:  No tenderness or deformity  Heart:  Regular rate and rhythm, S1, S2 normal, no murmur, rub or gallop. Carotids 2+ without bruit.  Abdomen:   Soft, non-tender, non distended. Bowel sounds active all four quadrants,  no masses, no organomegaly.  Neurologic: Normal affect, no gross deficits.   Results for orders placed during the hospital encounter of 08/29/12 (from the past 48 hour(s))  CBC WITH DIFFERENTIAL     Status: Abnormal   Collection Time   08/31/12  4:35 AM      Component Value Range Comment   WBC 7.3  4.0 - 10.5 K/uL    RBC 3.23 (*) 3.87 - 5.11 MIL/uL    Hemoglobin 9.9 (*) 12.0 - 15.0 g/dL    HCT 16.1 (*)  09.6 - 46.0 %    MCV 93.5  78.0 - 100.0 fL    MCH 30.7  26.0 - 34.0 pg    MCHC 32.8  30.0 - 36.0 g/dL    RDW 04.5  40.9 - 81.1 %    Platelets 120 (*) 150 - 400 K/uL    Neutrophils Relative 41 (*) 43 - 77 %    Neutro Abs 3.0  1.7 - 7.7 K/uL    Lymphocytes Relative 43  12 - 46 %    Lymphs Abs 3.1  0.7 - 4.0 K/uL    Monocytes Relative 9  3 - 12 %    Monocytes Absolute 0.6  0.1 - 1.0 K/uL    Eosinophils Relative 7 (*) 0 - 5 %    Eosinophils Absolute 0.5  0.0 - 0.7 K/uL    Basophils Relative 0  0 - 1 %    Basophils Absolute 0.0  0.0 - 0.1 K/uL   BASIC METABOLIC PANEL     Status: Abnormal   Collection Time   08/31/12  4:35 AM      Component Value Range Comment   Sodium 142  135 - 145 mEq/L    Potassium 3.6  3.5 - 5.1 mEq/L    Chloride 112  96 - 112 mEq/L    CO2 22  19 - 32 mEq/L    Glucose, Bld 79  70 - 99 mg/dL    BUN 26 (*) 6 - 23 mg/dL    Creatinine, Ser 9.14  0.50 - 1.10 mg/dL    Calcium 8.3 (*) 8.4 - 10.5 mg/dL    GFR calc non Af Amer 57 (*) >90 mL/min    GFR calc Af Amer 66 (*) >90 mL/min    Prothrombin Time/INR 1.34   Mr Abdomen W Wo Contrast  09/01/2012  *RADIOLOGY REPORT*  Clinical Data: Cirrhosis with indeterminate liver lesion on CT. Recent serum alpha-fetoprotein levels were not elevated.  MRI ABDOMEN WITH AND WITHOUT CONTRAST  Technique:  Multiplanar multisequence MR imaging of the abdomen was performed both before and after administration of intravenous contrast.  Contrast: 17mL MULTIHANCE GADOBENATE DIMEGLUMINE 529 MG/ML IV SOLN  Comparison: Abdominal pelvic CT 08/30/2012 and 03/15/2009.  Findings: The recently demonstrated lesion inferiorly in the right hepatic lobe is best seen post contrast and shows intense homogeneous enhancement on the arterial phase images.  This lesion measures 1.7 x 1.4 cm on the image 62 of series 1101.  On the more delayed postcontrast images, there is hypointensity of this lesion with respect to the surrounding hepatic parenchyma.  There is a  rim of hyperintensity. The lesion demonstrates mildly decreased T1 and increased T2 signal.  No other focal liver  lesions are identified. The hepatic contours are diffusely irregular consistent with cirrhosis.  There is a moderate sized right pleural effusion which has slightly enlarged compared with the prior CT.  There is associated atelectasis at the right lung base.  A large hiatal hernia is again noted with most of the stomach in the lower left chest.  The spleen is normal in size.  There is stable accessory splenic tissue.  No significant abnormalities of the gallbladder, biliary system, pancreas, adrenal glands or kidneys are seen.  The splenic and portal veins are patent.  There is ascites with asymmetric fluid in the subhepatic space and around the descending duodenum.  This is similar to the prior CT.  Small lymph nodes in the porta hepatis are stable.  Previously demonstrated colonic wall thickening is less evident.  There is stable aortic atherosclerosis.  IMPRESSION:  1.  1.7 cm lesion inferiorly in the right hepatic lobe demonstrates intense arterial phase enhancement and is concerning for a hypervascular metastasis or hepatocellular carcinoma in the setting of cirrhosis. Tissue sampling is recommended. 2.  Right pleural effusion and ascites presumably secondary to cirrhosis/liver disease.  No specific evidence of pancreatitis. 3. Stable large hiatal hernia.  No significant residual colonic wall thickening identified.   Original Report Authenticated By: Carey Bullocks, M.D.    Ct Abdomen Pelvis W Contrast  08/30/2012  *RADIOLOGY REPORT*  Clinical Data: GI bleeding.  CT ABDOMEN AND PELVIS WITH CONTRAST  Technique:  Multidetector CT imaging of the abdomen and pelvis was performed following the standard protocol during bolus administration of intravenous contrast.  Contrast: OMNIPAQUE IOHEXOL 300 MG/ML  SOLN  Comparison: 03/15/2009.  Findings: The lung bases demonstrate a right-sided pleural  effusion with overlying atelectasis.  There is also left basilar atelectasis.  The patient had a large hiatal hernia with most of the stomach up in the chest.  There are cirrhotic changes involving the liver with a small amount of fluid around the liver. There is a 13mm indeterminate lesion in the inferior aspect of the right hepatic lobe.  MRI is recommended for further evaluation and recommend correlation with alpha- fetoprotein level.  There is fluid around the gallbladder which is likely due to cirrhosis and ascites.  The common bile duct is normal in caliber.  The pancreas is normal.  The spleen is normal in size.  No focal splenic lesions. A stable accessory spleen is noted.  The adrenal glands and kidneys are unremarkable.  The portal and splenic veins are patent. Portal venous collaterals are noted.  The aorta is normal in caliber.  Moderate atherosclerotic calcifications.  The major branch vessels are patent.  The stomach is not well distended with contrast but no gross abnormalities are seen.  The duodenum is unremarkable.  The small bowel demonstrates areas of suspected mild wall thickening and slightly prominent mucosal folds.  The colon demonstrates diffuse colonic wall thickening and submucosal edema.  Findings suggest diffuse enterocolitis.  Some of this could be due to cirrhosis with low albumin and ascites.  An infectious or inflammatory process is also possible.  The uterus is surgically absent.  There is a small amount of free pelvic fluid.  The bladder is normal.  No pelvic mass or adenopathy.  IMPRESSION:  1.  Cirrhosis with portal hypertension and portal venous collaterals.  No splenomegaly but there is a small amount of ascites. 2.  13 mm right hepatic lobe liver lesion inferiorly is indeterminate.  Recommend follow-up MRI examination and correlation with alpha-fetoprotein  level.  3.  Diffuse colonic wall thickening and to a lesser extent the small bowel wall thickening.  Findings could  suggest changes related to cirrhosis, ascites and low albumin.  An infectious or inflammatory process is also possible.   Original Report Authenticated By: Rudie Meyer, M.D.     Assessment/Plan Newly found liver lesion Discussed US guided biopsy tomorrow. Explained procedure including risks and complications Labs all ok. Consent signed in chart  Brayton El PA-C 09/01/2012, 1:38 PM

## 2012-09-01 NOTE — Progress Notes (Signed)
[facility] PROGRESS NOTE  [name] MVH:[id number] DOB: 29-Aug-1928 DOA: 08/29/2012 PCP: Nadean Corwin, MD  Brief narrative: [name] is an 78 y.o. female with a PMH of hiatal hernia and esophageal ring requiring Savary dilatation and diverticulosis with a tortuous colon on prior colonoscopy, under the care of Dr. Ewing Schlein, who presented with a chief complaint of melanotic stools. Taken for upper endoscopy on 08/30/2012 and Sheria Lang lesions secondary to her hiatal hernia were felt to be the cause of the bleeding. Underwent CT scan of the abdomen on 08/30/2012 which showed portal hypertension and cirrhosis of uncertain etiology as well as a liver lesion. She had an MRI on 08/31/2012 which confirmed the above noted findings. She isfor ultrasound guided liver biopsy 09/02/2012.  Assessment/Plan: Principal Problem:  *Melena / hematemesis   Admitted and monitoring CBC q. 8 hours. PPI changed to oral dosing, twice a day. Seen by Dr. 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 Continue to hold aspirin. Type and screen done in case transfusion needed. Has 2 large-bore IVs. Hemoglobin drop of 3.5 g noted since admission, currently 9.9 mg/dL. No further evidence of bleeding. Active Problems:  Anxiety  Xanax 3 times a day when necessary. Right pleural effusion / wheezing  Enlarging per MRI report. We'll KVO IV fluids. If she continues to have problems with dyspnea or wheezing, may need to tap it. We'll place on bronchodilator therapy Moderate malnutrition in the context of acute illness  Seen by dietitian 08/31/2012. Fatigue  Unclear etiology. B12 and TSH within normal limits.  Physical therapy evaluation requested. Cirrhosis / portal hypertension  Found incidentally on CT scans of the abdomen and pelvis done 08/30/2012. Thought to be from nonalcoholic fatty liver disease. Had followup MRI scan on 08/31/2012. No evidence of  varices on endoscopy. AFP not elevated. Does have mild coagulopathy and slightly low albumin but no elevation of bilirubin.  For ultrasound-guided biopsy of the liver 09/02/2012. Hypertension   Bumex held. We'll give 1 dose today as holding diuretics may be contributing to enlarging pleural effusion. May continue atenolol as blood pressure allows. Diabetes mellitus   Most recent hemoglobin A1c 5.8. Not on any diabetes medications. Coronary artery disease   Holding aspirin, continue beta blocker as blood pressure tolerates.   Code Status: Full  Family Communication: Daughter updated at bedside.  Disposition Plan: Home when stable.  Medical Consultants:  Dr. Vida Rigger, gastroenterology  Interventional radiology  Other Consultants:  Dietitian  Physical therapy  Anti-infectives:  None.  HPI/Subjective: The patient has not had any abdominal symptoms or recurrent bloody stools. She is now complaining of shortness of breath and wheezing. She is very anxious about her biopsy tomorrow.  Objective: Filed Vitals:   08/31/12 0925 08/31/12 1400 08/31/12 2218 09/01/12 0620  BP: 115/73 114/71 119/64 123/76  Pulse: 79 72 83 69  Temp:  98.4 F (36.9 C) 98.4 F (36.9 C) 98.3 F (36.8 C)  TempSrc:  Oral Oral Oral  Resp:  18 18 18   Height:      Weight:      SpO2:  95% 97% 98%    Intake/Output Summary (Last 24 hours) at 09/01/12 1526 Last data filed at 09/01/12 1400  Gross per 24 hour  Intake   2162 ml  Output    200 ml  Net   1962 ml    Exam: Gen:  NAD Cardiovascular:  RRR, No M/R/G Respiratory:  Lungs with high pitched wheezing on  the left, diminished on the right Gastrointestinal:  Abdomen soft, NT/ND, + BS Extremities:  No C/E/C  Data Reviewed: Basic Metabolic Panel:  Lab 08/31/12 6213 08/30/12 0531 08/29/12 1005  NA 142 143 140  K 3.6 4.2 --  CL 112 115* 106  CO2 22 21 23   GLUCOSE 79 103* 138*  BUN 26* 39* 30*  CREATININE 0.91 0.77 0.81  CALCIUM 8.3*  9.1 10.4  MG -- -- --  PHOS -- -- --   GFR Estimated Creatinine Clearance: 51.5 ml/min (by C-G formula based on Cr of 0.91). Liver Function Tests:  Lab 08/29/12 1005  AST 46*  ALT 30  ALKPHOS 122*  BILITOT 0.9  PROT 6.8  ALBUMIN 3.0*   Coagulation profile  Lab 08/29/12 1505  INR 1.34  PROTIME --    CBC:  Lab 08/31/12 0435 08/30/12 0531 08/29/12 2230 08/29/12 1505 08/29/12 1005  WBC 7.3 8.9 8.8 8.4 9.1  NEUTROABS 3.0 -- -- -- 5.8  HGB 9.9* 10.6* 11.5* 12.1 13.5  HCT 30.2* 32.3* 34.2* 36.5 40.0  MCV 93.5 93.6 92.4 92.2 91.1  PLT 120* 133* 149* 149* 194   BNP (last 3 results)  Basename 12/06/11 2000  PROBNP 79.6   CBG:  Lab 08/29/12 1451  GLUCAP 118*    Procedures and Diagnostic Studies:  Dg Abd Acute W/chest 08/29/2012 IMPRESSION:  1.  No acute finding chest or abdomen. 2.  Hiatal hernia.   Original Report Authenticated By: Holley Dexter, M.D.     Ct Abdomen Pelvis W Contrast 08/30/2012 IMPRESSION:  1.  Cirrhosis with portal hypertension and portal venous collaterals.  No splenomegaly but there is a small amount of ascites. 2.  13 mm right hepatic lobe liver lesion inferiorly is indeterminate.  Recommend follow-up MRI examination and correlation with alpha-fetoprotein level.  3.  Diffuse colonic wall thickening and to a lesser extent the small bowel wall thickening.  Findings could suggest changes related to cirrhosis, ascites and low albumin.  An infectious or inflammatory process is also possible.   Original Report Authenticated By: Rudie Meyer, M.D.     Scheduled Meds:    . [COMPLETED] ALPRAZolam  0.5 mg Oral NOW  . atenolol  100 mg Oral Daily  . cholecalciferol  1,000 Units Oral Daily  . pantoprazole  40 mg Oral BID AC  . sucralfate  1 g Oral TID WC & HS   Continuous Infusions:    . sodium chloride 20 mL/hr at 09/01/12 1509    Time spent: 35 minutes.   LOS: 3 days   Betsie Peckman  [facility] Pager [phone].  If 8PM-8AM, please  contact night-coverage at [email or web address], password [facility] 09/01/2012, 3:26 PM

## 2012-09-01 NOTE — Evaluation (Signed)
Physical Therapy Evaluation Patient Details Name: [name] MRN: [id number] DOB: Jul 04, 1928 Today's Date: 09/01/2012 Time: 1020-1040 PT Time Calculation (min): 20 min  PT Assessment / Plan / Recommendation Clinical Impression  78 yo female admitted with melena. Independent prior to admission. On eval pt is Min-guard for ambulation distance of 160 feet in hallway. Mobilizing fairly well. Do not anticipate any follow up PT needs at discharge. Will follow during stay.     PT Assessment  Patient needs continued PT services    Follow Up Recommendations  No PT follow up    Does the patient have the potential to tolerate intense rehabilitation      Barriers to Discharge        Equipment Recommendations  None recommended by PT    Recommendations for Other Services OT consult   Frequency Min 3X/week    Precautions / Restrictions Precautions Precautions: None Restrictions Weight Bearing Restrictions: No   Pertinent Vitals/Pain Pt denies      Mobility  Bed Mobility Bed Mobility: Supine to Sit;Sit to Supine Supine to Sit: 6: Modified independent (Device/Increase time) Sit to Supine: 6: Modified independent (Device/Increase time) Transfers Transfers: Stand to Sit;Sit to Stand Sit to Stand: 6: Modified independent (Device/Increase time);From bed Stand to Sit: 6: Modified independent (Device/Increase time);To bed Ambulation/Gait Ambulation/Gait Assistance: 4: Min guard Ambulation Distance (Feet): 160 Feet Assistive device: None Ambulation/Gait Assistance Details: Pt appears to have limp while ambulating. Denies pain and states this is chronic. No LOB, but slightly unsteady at times. Fair gait speed Gait Pattern: Step-through pattern;Decreased stride length    Shoulder Instructions     Exercises     PT Diagnosis: Generalized weakness  PT Problem List: Decreased mobility PT Treatment Interventions: Gait training;Functional mobility training;Therapeutic  activities;Therapeutic exercise;Patient/family education   PT Goals Acute Rehab PT Goals PT Goal Formulation: With patient Time For Goal Achievement: 09/15/12 Potential to Achieve Goals: Good Pt will Ambulate: >150 feet;with modified independence PT Goal: Ambulate - Progress: Goal set today  Visit Information  Last PT Received On: 09/01/12 Assistance Needed: +1    Subjective Data  Subjective: "I've been in bed since Saturday" Patient Stated Goal: home once better   Prior Functioning  Home Living Lives With: Alone Type of Home: Apartment Home Access: Level entry Home Layout: One level Home Adaptive Equipment: None Prior Function Level of Independence: Independent Able to Take Stairs?: No Driving: Yes Communication Communication: No difficulties    Cognition  Overall Cognitive Status: Appears within functional limits for tasks assessed/performed Arousal/Alertness: Awake/alert Orientation Level: Appears intact for tasks assessed Behavior During Session: [facility] for tasks performed    Extremity/Trunk Assessment Right Lower Extremity Assessment RLE ROM/Strength/Tone: [facility] for tasks assessed Left Lower Extremity Assessment LLE ROM/Strength/Tone: [facility] for tasks assessed   Balance    End of Session PT - End of Session Equipment Utilized During Treatment: Gait belt Activity Tolerance: Patient tolerated treatment well Patient left: in bed;with call bell/phone within reach;with family/visitor present  GP     Rebeca Alert [facility] 09/01/2012, 12:18 PM [phone]

## 2012-09-01 NOTE — H&P (Signed)
Agree 

## 2012-09-01 NOTE — Progress Notes (Signed)
Subjective: Feels weak and fatigued. No further melena.  Objective: Vital signs in last 24 hours: Temp:  [98.3 F (36.8 C)-98.4 F (36.9 C)] 98.3 F (36.8 C) (12/10 0620) Pulse Rate:  [69-83] 69  (12/10 0620) Resp:  [18] 18  (12/10 0620) BP: (114-123)/(64-76) 123/76 mmHg (12/10 0620) SpO2:  [95 %-98 %] 98 % (12/10 0620) Weight change:  Last BM Date: 08/31/12  PE: GEN:  NAD, a bit deconditioned-appearing ABD:  Soft, bit protuberant, non-tender  Lab Results: CBC    Component Value Date/Time   WBC 7.3 08/31/2012 0435   WBC 6.2 08/05/2012 1315   WBC 7.4 11/27/2007 1058   RBC 3.23* 08/31/2012 0435   RBC 4.52 11/27/2007 1058   HGB 9.9* 08/31/2012 0435   HGB 14.2 08/05/2012 1315   HGB 14.0 11/27/2007 1058   HCT 30.2* 08/31/2012 0435   HCT 40.8 08/05/2012 1315   HCT 39.9 11/27/2007 1058   PLT 120* 08/31/2012 0435   PLT 128* 08/05/2012 1315   PLT 262 11/27/2007 1058   MCV 93.5 08/31/2012 0435   MCV 92 08/05/2012 1315   MCV 88.3 11/27/2007 1058   MCH 30.7 08/31/2012 0435   MCH 32.0 08/05/2012 1315   MCH 31.0 11/27/2007 1058   MCHC 32.8 08/31/2012 0435   MCHC 34.8 08/05/2012 1315   MCHC 35.2 11/27/2007 1058   RDW 13.6 08/31/2012 0435   RDW 13.2 08/05/2012 1315   RDW 12.4 11/27/2007 1058   LYMPHSABS 3.1 08/31/2012 0435   LYMPHSABS 2.4 08/05/2012 1315   LYMPHSABS 2.6 11/27/2007 1058   MONOABS 0.6 08/31/2012 0435   MONOABS 0.6 11/27/2007 1058   EOSABS 0.5 08/31/2012 0435   EOSABS 0.2 08/05/2012 1315   EOSABS 0.1 11/27/2007 1058   BASOSABS 0.0 08/31/2012 0435   BASOSABS 0.0 08/05/2012 1315   BASOSABS 0.0 11/27/2007 1058   CMP     Component Value Date/Time   NA 142 08/31/2012 0435   K 3.6 08/31/2012 0435   CL 112 08/31/2012 0435   CO2 22 08/31/2012 0435   GLUCOSE 79 08/31/2012 0435   BUN 26* 08/31/2012 0435   CREATININE 0.91 08/31/2012 0435   CALCIUM 8.3* 08/31/2012 0435   PROT 6.8 08/29/2012 1005   ALBUMIN 3.0* 08/29/2012 1005   AST 46* 08/29/2012 1005   ALT 30 08/29/2012 1005   ALKPHOS 122* 08/29/2012  1005   BILITOT 0.9 08/29/2012 1005   GFRNONAA 57* 08/31/2012 0435   GFRAA 66* 08/31/2012 0435    AFP 5.9 (nl < 8)  MRI ABD:  Personally reviewed; hypervascular lesion 1.7 cm peripheral right lobe of liver, worrisome for hepatoma or hypervascular metastasis.  Assessment:  1.  Cirrhosis with portal hypertension.  Likely non-alcoholic fatty liver in origin. 2.  Melena, with hiatal hernia and Cameron's erosions seen on recent endoscopy.  Resolved. 3.  Anemia, likely multifactorial, suspect both chronic disease and acute blood loss etiologies. 4.  Liver lesion worrisome for hepatoma or hypervascular metastasis.  AFP normal.  Plan:  1.  Advance diet. 2.  Continue Protonix 40 mg po bid x 6 weeks, then 40 mg once-a-day for the subsequent next 4-6 weeks. 3.  Stop Carafate. 4.  I have discussed case with Dr. Fredia Sorrow in  Interventional Radiology.  He will review patient's candidacy for percutaneous image-guided liver biopsy; if appropriate, hopefully this can be done tomorrow. 5.  Case discussed at length with patient's daughter [name] as well as Dr. Ewing Schlein.   Freddy Jaksch 09/01/2012, 10:22 AM

## 2012-09-02 ENCOUNTER — Inpatient Hospital Stay (HOSPITAL_COMMUNITY): Payer: Medicare Other

## 2012-09-02 DIAGNOSIS — D696 Thrombocytopenia, unspecified: Secondary | ICD-10-CM

## 2012-09-02 DIAGNOSIS — K253 Acute gastric ulcer without hemorrhage or perforation: Secondary | ICD-10-CM

## 2012-09-02 DIAGNOSIS — K922 Gastrointestinal hemorrhage, unspecified: Secondary | ICD-10-CM

## 2012-09-02 DIAGNOSIS — D509 Iron deficiency anemia, unspecified: Secondary | ICD-10-CM

## 2012-09-02 LAB — CBC
MCV: 89.7 fL (ref 78.0–100.0)
Platelets: 126 10*3/uL — ABNORMAL LOW (ref 150–400)
RBC: 3.39 MIL/uL — ABNORMAL LOW (ref 3.87–5.11)
RDW: 13.2 % (ref 11.5–15.5)
WBC: 6.1 10*3/uL (ref 4.0–10.5)

## 2012-09-02 LAB — BASIC METABOLIC PANEL
CO2: 21 mEq/L (ref 19–32)
Calcium: 8.5 mg/dL (ref 8.4–10.5)
Creatinine, Ser: 0.86 mg/dL (ref 0.50–1.10)
GFR calc Af Amer: 71 mL/min — ABNORMAL LOW (ref >90)
GFR calc non Af Amer: 61 mL/min — ABNORMAL LOW (ref >90)
Sodium: 142 mEq/L (ref 135–145)

## 2012-09-02 MED ORDER — HYDROCODONE-ACETAMINOPHEN 5-325 MG PO TABS: 1.0000 | ORAL_TABLET | ORAL | Status: AC | PRN

## 2012-09-02 MED ORDER — BUMETANIDE 0.5 MG PO TABS
0.5000 mg | ORAL_TABLET | Freq: Once | ORAL | Status: AC
  Administered 2012-09-02: 0.5 mg via ORAL
  Filled 2012-09-02: qty 1

## 2012-09-02 MED ORDER — FENTANYL CITRATE 0.05 MG/ML IJ SOLN
INTRAMUSCULAR | Status: AC | PRN
  Administered 2012-09-02 (×2): 50 ug via INTRAVENOUS

## 2012-09-02 MED ORDER — MIDAZOLAM HCL 2 MG/2ML IJ SOLN
INTRAMUSCULAR | Status: AC | PRN
  Administered 2012-09-02 (×2): 1 mg via INTRAVENOUS

## 2012-09-02 MED ORDER — POTASSIUM CHLORIDE CRYS ER 20 MEQ PO TBCR
40.0000 meq | EXTENDED_RELEASE_TABLET | Freq: Two times a day (BID) | ORAL | Status: AC
  Administered 2012-09-02 (×2): 40 meq via ORAL
  Filled 2012-09-02 (×2): qty 2

## 2012-09-02 NOTE — Progress Notes (Signed)
[facility] PROGRESS NOTE  [name] ZOX:[id number] DOB: Apr 12, 1928 DOA: 08/29/2012 PCP: Nadean Corwin, MD  Assessment/Plan: 1. UGIB--EGD done 08/30/2012 which showed Cameron's lesions secondary to hiatal hernia. Continue to hold aspirin. BID PPI for 6 weeks. Colonic findings on CT not thought to be significant per GI. 2. Anemia--stable. Likely multifactorial, suspect both chronic disease and acute blood loss etiologies. Follow-up as outpatient. 3. Thrombocytopenia--stable, etiology unclear, follow as outpatient. 4. Cirrhosis with portal hypertension--no evidence of varices on yesterday's endoscopy. Found incidentally on CT scans of the abdomen and pelvis done 08/30/2012. Suspect non-alcoholic fatty liver. 5. Liver mass--s/p biopsy, follow-up with GI as an outpatient. AFP not elevated.  6. Right pleural effusion/wheezing--trial diuretic, repeat CXR in AM to reassess. 7. Hypokalemia--replete 8. DM type 2--diet controlled 9. History of CAD--ASA on hold. 10. Non-severe/moderate malnutrition--  Code Status: Full code Family Communication: discussed with daughter at bedside Disposition Plan: home when improved  Brendia Sacks, MD  [facility] Team 6 Pager [phone] If 8PM-8AM, please contact night-coverage at [email or web address], password [facility] 09/02/2012, 7:20 PM  LOS: 4 days   Brief narrative: [name] is an 78 y.o. female with a PMH of hiatal hernia and esophageal ring requiring Savary dilatation and diverticulosis with a tortuous colon on prior colonoscopy, under the care of Dr. Ewing Schlein, who presented with a chief complaint of melanotic stools  Taken for upper endoscopy on 08/30/2012 and [name] lesions secondary to her hiatal hernia were felt to be the cause of the bleeding. Underwent CT scan of the abdomen on 08/30/2012 which showed portal hypertension and cirrhosis of uncertain etiology as well as a liver lesion. She had an MRI on 08/31/2012 which confirmed the above  noted findings. S/p liver biopsy 09/02/2012.  Consultants:  [facility]  PT--HH PTOT  Procedures:  12/8 EGD--bleeding probably from [place] lesions secondary to her moderate-sized hiatal hernia  12/11 liver biopsy  HPI/Subjective: Feels overall better, no bleeding. However, complains of wheezing and SOB not responsive to breathing treatment.  Objective: Filed Vitals:   09/02/12 1100 09/02/12 1107 09/02/12 1118 09/02/12 1400  BP: 117/62 111/61 116/70 135/55  Pulse: 66 66 70 60  Temp:   97.9 F (36.6 C) 97.9 F (36.6 C)  TempSrc:   Oral Oral  Resp: 23 20 18 18   Height:      Weight:      SpO2: 93% 95% 94% 93%    Intake/Output Summary (Last 24 hours) at 09/02/12 1920 Last data filed at 09/02/12 1800  Gross per 24 hour  Intake    960 ml  Output   1300 ml  Net   -340 ml   Filed Weights   08/29/12 1700  Weight: 82.101 kg (181 lb)    Exam:  General:  Appears calm and mildly uncomfortable Cardiovascular: RRR, no m/r/g. No LE edema. Respiratory: some inspiratory wheezes bilaterally, no rales, rhonchi. Normal respiratory effort. Speaks in full sentences. Abdomen: soft, ntnd Psychiatric: grossly normal mood and affect, speech fluent and appropriate  Data Reviewed: Basic Metabolic Panel:  Lab 09/02/12 8295 08/31/12 0435 08/30/12 0531 08/29/12 1005  NA 142 142 143 140  K 3.1* 3.6 4.2 4.2  CL 112 112 115* 106  CO2 21 22 21 23   GLUCOSE 89 79 103* 138*  BUN 14 26* 39* 30*  CREATININE 0.86 0.91 0.77 0.81  CALCIUM 8.5 8.3* 9.1 10.4  MG -- -- -- --  PHOS -- -- -- --   Liver Function Tests:  Lab 08/29/12 1005  AST  46*  ALT 30  ALKPHOS 122*  BILITOT 0.9  PROT 6.8  ALBUMIN 3.0*   CBC:  Lab 09/02/12 0419 08/31/12 0435 08/30/12 0531 08/29/12 2230 08/29/12 1505 08/29/12 1005  WBC 6.1 7.3 8.9 8.8 8.4 --  NEUTROABS -- 3.0 -- -- -- 5.8  HGB 10.6* 9.9* 10.6* 11.5* 12.1 --  HCT 30.4* 30.2* 32.3* 34.2* 36.5 --  MCV 89.7 93.5 93.6 92.4 92.2 --  PLT 126* 120* 133* 149*  149* --   CBG:  Lab 08/29/12 1451  GLUCAP 118*    Recent Results (from the past 240 hour(s))  URINE CULTURE     Status: Normal   Collection Time   08/29/12 11:02 AM      Component Value Range Status Comment   Specimen Description URINE, CLEAN CATCH   Final    Special Requests NONE   Final    Culture  Setup Time 08/29/2012 19:31   Final    Colony Count NO GROWTH   Final    Culture NO GROWTH   Final    Report Status 08/30/2012 FINAL   Final      Studies: US Biopsy  09/02/2012  *RADIOLOGY REPORT*  Clinical data:  Hypervascular liver lesion.  No known primary malignancy.  ULTRASOUND-GUIDED FNA LIVER LESION BIOPSY  Technique and findings: The procedure, risks (including but not limited to bleeding, infection, organ damage), benefits, and alternatives were explained to the patient.  Questions regarding the procedure were encouraged and answered.  The patient understands and consents to the procedure.Survey ultrasound of the liver was performed.  The lesion was localized in the inferior aspect of the right lobe.  There is significant lesion motion with the respirations. Because of this and the peripheral location of the small lesion, coaxial core biopsy was not a viable option.  An appropriate skin entry site was determined.  Site was marked, prepped with Betadine, draped in usual sterile fashion, infiltrated locally with 1% lidocaine.  Intravenous Fentanyl and Versed were administered as conscious sedation during continuous cardiorespiratory monitoring by the radiology RN, with a total moderate sedation time of 20 minutes.  Under real time ultrasound guidance, two 20-gauge FNA biopsy samples were obtained of the lesion. These were submitted to cytopathology.  The patient tolerated procedure well.  No immediate complication.  IMPRESSION: 1.  Technically successful ultrasound-guided FNA biopsy of the right liver lesion.   Original Report Authenticated By: D. Andria Rhein, MD     Scheduled Meds:    . atenolol  100 mg Oral Daily  . [COMPLETED] bumetanide  0.5 mg Oral Once  . cholecalciferol  1,000 Units Oral Daily  . pantoprazole  40 mg Oral BID AC  . potassium chloride  40 mEq Oral BID  . sucralfate  1 g Oral TID WC & HS   Continuous Infusions:   . sodium chloride 20 mL/hr (09/02/12 1625)    Principal Problem:  *UGIB (upper gastrointestinal bleed) Active Problems:  Hypertension  Diabetes mellitus  Coronary artery disease  Melena  Cirrhosis  Portal hypertension  Moderate malnutrition in the context of acute illness  Hiatal hernia  [name] lesion, acute  Thrombocytopenia     Brendia Sacks, MD  [facility] Team 6 Pager [phone] If 8PM-8AM, please contact night-coverage at [email or web address], password [facility] 09/02/2012, 7:20 PM  LOS: 4 days   Time spent: 35 minutes

## 2012-09-02 NOTE — Progress Notes (Signed)
Physical Therapy Treatment Patient Details Name: [name] MRN: [id number] DOB: [birth year] Today's Date: 09/02/2012 Time: 9629-5284 PT Time Calculation (min): 32 min  PT Assessment / Plan / Recommendation Comments on Treatment Session  Pt appears a little weaker during today's session compared to last. Daughter present and had many questions concerning discharge. CM answered questions during visit. Discussed daughter ambulating with patient in hallway-feel this is appropriate with use of RW. Daughter states that family should be able to check on pt periordically once home. Pt declined ST rehab stay at SNF -"I'm not that bad." Recommend HHPT/HHOT and intermittent supervision.     Follow Up Recommendations  Home health PT;Supervision - Intermittent (Recommend HHOT as well)     Does the patient have the potential to tolerate intense rehabilitation     Barriers to Discharge        Equipment Recommendations  Rolling walker with 5" wheels    Recommendations for Other Services OT consult  Frequency Min 3X/week   Plan Discharge plan needs to be updated    Precautions / Restrictions Precautions Precautions: Fall Restrictions Weight Bearing Restrictions: No   Pertinent Vitals/Pain Pt denies pain    Mobility  Bed Mobility Bed Mobility: Supine to Sit;Sit to Supine Supine to Sit: 4: Min assist Sit to Supine: 6: Modified independent (Device/Increase time) Details for Bed Mobility Assistance: Assist for trunk to upright. Increased time.  Transfers Transfers: Sit to Stand;Stand to Sit Sit to Stand: 5: Supervision;From bed Stand to Sit: 5: Supervision;To bed Details for Transfer Assistance: VCs safety Ambulation/Gait Ambulation/Gait Assistance: 4: Min guard Ambulation Distance (Feet): 150 Feet (x 2.) Assistive device: Rolling walker Ambulation/Gait Assistance Details: 2 walks today: 1 while pushing IV pole, 1 with RW. Pt slightly unsteady at start of walk and asked for something to  hold onto. Switched over to RW for increased stability and security for pt. VCs safety, posture, proper technique for walker.  Gait Pattern: Step-through pattern;Decreased stride length;Trunk flexed    Exercises General Exercises - Lower Extremity Long Arc Quad: AROM;10 reps;Seated;Both Hip Flexion/Marching: AROM;Both;10 reps;Seated   PT Diagnosis:    PT Problem List:   PT Treatment Interventions:     PT Goals Acute Rehab PT Goals Potential to Achieve Goals: Good Pt will go Supine/Side to Sit: with modified independence PT Goal: Supine/Side to Sit - Progress: Goal set today Pt will go Sit to Stand: with modified independence PT Goal: Sit to Stand - Progress: Goal set today Pt will Ambulate: >150 feet;with modified independence;with least restrictive assistive device PT Goal: Ambulate - Progress: Progressing toward goal  Visit Information  Last PT Received On: 09/02/12 Assistance Needed: +1    Subjective Data  Subjective: "I lost a lot of blood. I haven't been able to eat until today" Patient Stated Goal: Home. Get better   Cognition  Overall Cognitive Status: Appears within functional limits for tasks assessed/performed Arousal/Alertness: Awake/alert Orientation Level: Appears intact for tasks assessed Behavior During Session: [facility] for tasks performed    Balance     End of Session PT - End of Session Equipment Utilized During Treatment: Gait belt Activity Tolerance: Patient limited by fatigue Patient left: in bed;with call bell/phone within reach;with family/visitor present   GP     Rebeca Alert [facility] 09/02/2012, 2:41 PM [phone]

## 2012-09-02 NOTE — Progress Notes (Signed)
Subjective: No further melena. Had IR-guided biopsy of liver lesion today.  Objective: Vital signs in last 24 hours: Temp:  [97.8 F (36.6 C)-98.6 F (37 C)] 97.9 F (36.6 C) (12/11 1400) Pulse Rate:  [60-75] 60  (12/11 1400) Resp:  [18-31] 18  (12/11 1400) BP: (111-172)/(55-90) 135/55 mmHg (12/11 1400) SpO2:  [93 %-98 %] 93 % (12/11 1400) Weight change:  Last BM Date: 09/01/12  PE: GEN:  NAD ABD:  Soft  Lab Results: CBC    Component Value Date/Time   WBC 6.1 09/02/2012 0419   WBC 6.2 08/05/2012 1315   WBC 7.4 11/27/2007 1058   RBC 3.39* 09/02/2012 0419   RBC 4.52 11/27/2007 1058   HGB 10.6* 09/02/2012 0419   HGB 14.2 08/05/2012 1315   HGB 14.0 11/27/2007 1058   HCT 30.4* 09/02/2012 0419   HCT 40.8 08/05/2012 1315   HCT 39.9 11/27/2007 1058   PLT 126* 09/02/2012 0419   PLT 128* 08/05/2012 1315   PLT 262 11/27/2007 1058   MCV 89.7 09/02/2012 0419   MCV 92 08/05/2012 1315   MCV 88.3 11/27/2007 1058   MCH 31.3 09/02/2012 0419   MCH 32.0 08/05/2012 1315   MCH 31.0 11/27/2007 1058   MCHC 34.9 09/02/2012 0419   MCHC 34.8 08/05/2012 1315   MCHC 35.2 11/27/2007 1058   RDW 13.2 09/02/2012 0419   RDW 13.2 08/05/2012 1315   RDW 12.4 11/27/2007 1058   LYMPHSABS 3.1 08/31/2012 0435   LYMPHSABS 2.4 08/05/2012 1315   LYMPHSABS 2.6 11/27/2007 1058   MONOABS 0.6 08/31/2012 0435   MONOABS 0.6 11/27/2007 1058   EOSABS 0.5 08/31/2012 0435   EOSABS 0.2 08/05/2012 1315   EOSABS 0.1 11/27/2007 1058   BASOSABS 0.0 08/31/2012 0435   BASOSABS 0.0 08/05/2012 1315   BASOSABS 0.0 11/27/2007 1058   Assessment:  1.  Melena, resolved. 2.  Liver lesion, biopsied.  Plan:  1.  Await final liver biopsy results;preliminary cytology shows fatty tissue without obvious malignancy. 2.  Will await final results for liver biopsy, but would not wait for final biopsy findings in order to determine disposition. 3.  Will discuss with Dr. Irene Limbo.  I have discussed with patient and her daughter,  [name].   [name] 09/02/2012, 3:33 PM

## 2012-09-02 NOTE — Procedures (Signed)
20g FNA x2 right liver lesion under [place] No complication No blood loss. See complete dictation in [facility].

## 2012-09-02 NOTE — Progress Notes (Signed)
Called to patient room for requested nebulizer therapy. Patient sitting up at bedside, NAD, complaining of inability to lay down without wheezing noises and SOB. However, she states she feels that her breathing is fine if she is sitting up. Auscultation reveals bibasilar crackles. Otherwise, clear BBS. VSS. Family member also at bedside. RT does not feel that nebulizer therapy is warranted at this time. Education provided to both the patient and the family member. Communicated assessment results with RN.

## 2012-09-03 ENCOUNTER — Inpatient Hospital Stay (HOSPITAL_COMMUNITY): Payer: Medicare Other

## 2012-09-03 DIAGNOSIS — J90 Pleural effusion, not elsewhere classified: Secondary | ICD-10-CM

## 2012-09-03 LAB — GLUCOSE, SEROUS FLUID: Glucose, Fluid: 134 mg/dL

## 2012-09-03 LAB — PROTEIN, BODY FLUID

## 2012-09-03 NOTE — Care Management Note (Signed)
    Page 1 of 2   09/03/2012     11:32:34 AM   CARE MANAGEMENT NOTE 09/03/2012  Patient:  [name], [name]   Account Number:  [id number]  Date Initiated:  09/03/2012  Documentation initiated by:  Lorenda Ishihara  Subjective/Objective Assessment:   78 yo female admitted with melana, now with mass in liver, s/p bx, also pleural effusions s/p thoracentesis.     Action/Plan:   Home when stable   Anticipated DC Date:  09/05/2012   Anticipated DC Plan:  HOME W HOME HEALTH SERVICES      DC Planning Services  CM consult      PAC Choice  DURABLE MEDICAL EQUIPMENT  HOME HEALTH   Choice offered to / List presented to:  C-4 Adult Children   DME arranged  3-N-1  Levan Hurst      DME agency  [facility]     [facility] arranged  HH-1 RN  HH-10 DISEASE MANAGEMENT  HH-2 PT  HH-3 OT      HH agency  [facility]   Status of service:  In process, will continue to follow Medicare Important Message given?   (If response is "NO", the following Medicare IM given date fields will be blank) Date Medicare IM given:   Date Additional Medicare IM given:    Discharge Disposition:    Per UR Regulation:  Reviewed for med. necessity/level of care/duration of stay  If discussed at Long Length of Stay Meetings, dates discussed:    Comments:  09-03-12 Lorenda Ishihara RN CM 1125 Was asked to speak with dtr regarding HH options. Patient present during discussion. Dtr asking about HH options, discussed skilled services vs custodial services, discussed SNF option. Patient lives in senior housing, independent of ADL's. Family available to check on patient but can not provide 24h care. Family available for meals, housekeeping assistance, etc. Provide dtr with information on [facility] call system. Provide dtr with list of HH agencies and private duty caregivers. Patient states she would like to use [facility] for [facility] services. Dtr request walker and 3-in-1 for DME needs. Will continue to follow.

## 2012-09-03 NOTE — Progress Notes (Signed)
PT Cancellation Note  Patient Details Name: [name] MRN: [id number] DOB: 1928-05-23   Cancelled Treatment:     Attempted PT tx session. Pt/daughter report ambulating after procedure today. Currently awaiting visit from [facility] rep. Pt preferred to defer tx session at this time. Daughter stated she would ambulate in hallway with pt later today. Will check back on tomorrow. Thanks. Rebeca Alert, PT [phone]    Rebeca Alert [place] 09/03/2012, 2:40 PM [phone]

## 2012-09-03 NOTE — Progress Notes (Signed)
Subjective: No melena.  Objective: Vital signs in last 24 hours: Temp:  [97.8 F (36.6 C)-98 F (36.7 C)] 97.8 F (36.6 C) (12/12 0535) Pulse Rate:  [60-75] 60  (12/12 0535) Resp:  [18-31] 18  (12/12 0535) BP: (108-172)/(55-90) 108/68 mmHg (12/12 0535) SpO2:  [92 %-96 %] 96 % (12/12 0535) Weight change:  Last BM Date: 09/01/12  PE: GEN:  NAD  Lab Results: CBC    Component Value Date/Time   WBC 6.1 09/02/2012 0419   WBC 6.2 08/05/2012 1315   WBC 7.4 11/27/2007 1058   RBC 3.39* 09/02/2012 0419   RBC 4.52 11/27/2007 1058   HGB 10.6* 09/02/2012 0419   HGB 14.2 08/05/2012 1315   HGB 14.0 11/27/2007 1058   HCT 30.4* 09/02/2012 0419   HCT 40.8 08/05/2012 1315   HCT 39.9 11/27/2007 1058   PLT 126* 09/02/2012 0419   PLT 128* 08/05/2012 1315   PLT 262 11/27/2007 1058   MCV 89.7 09/02/2012 0419   MCV 92 08/05/2012 1315   MCV 88.3 11/27/2007 1058   MCH 31.3 09/02/2012 0419   MCH 32.0 08/05/2012 1315   MCH 31.0 11/27/2007 1058   MCHC 34.9 09/02/2012 0419   MCHC 34.8 08/05/2012 1315   MCHC 35.2 11/27/2007 1058   RDW 13.2 09/02/2012 0419   RDW 13.2 08/05/2012 1315   RDW 12.4 11/27/2007 1058   LYMPHSABS 3.1 08/31/2012 0435   LYMPHSABS 2.4 08/05/2012 1315   LYMPHSABS 2.6 11/27/2007 1058   MONOABS 0.6 08/31/2012 0435   MONOABS 0.6 11/27/2007 1058   EOSABS 0.5 08/31/2012 0435   EOSABS 0.2 08/05/2012 1315   EOSABS 0.1 11/27/2007 1058   BASOSABS 0.0 08/31/2012 0435   BASOSABS 0.0 08/05/2012 1315   BASOSABS 0.0 11/27/2007 1058   Assessment:  1.  Melena, resolved. 2.  Cirrhosis, likely non-alcoholic fatty liver. 3.  Liver lesion, preliminary biopsies show no cancer; AFP normal.  Plan:  1.  Awaiting final pathology results from liver biopsy. 2.  If final biopsies show no malignancy, will likely follow up with repeat scan in 2-3 months. 3.  No further GI recommendations at this time; we will arrange outpatient follow-up with Dr. Ewing Schlein.   Freddy Jaksch 09/03/2012, 7:55 AM

## 2012-09-03 NOTE — Progress Notes (Signed)
[facility] PROGRESS NOTE  [name]:[id number] DOB: July 09, 1928 DOA: 08/29/2012 PCP: Nadean Corwin, MD  Assessment/Plan: 1. Right pleural effusion--continues to increase on each subsequent study, now large, associated with orthopnea and intermittent SOB. Discussed risk/benefit thoracentesis with patient, daughter [name]. Recommend proceed for primarily therapeutic benefit but send studies for diagnostic purposes. Transudate suspected. Afebrile, normal WBC, RR, no hypoxia no evidence of infection. 2. UGIB--stable. EGD done 08/30/2012 which showed Cameron's lesions secondary to hiatal hernia. Continue to hold aspirin. BID PPI for 6 weeks. Colonic findings on CT not thought to be significant per GI. 3. Anemia--stable. Likely multifactorial, suspect both chronic disease and acute blood loss etiologies. Follow-up as outpatient. 4. Thrombocytopenia--stable, etiology unclear, follow as outpatient. Suspect secondary to cirrhosis. 5. Cirrhosis with portal hypertension--no evidence of varices on endoscopy. Found incidentally on CT scans of the abdomen and pelvis done 08/30/2012. Suspect non-alcoholic fatty liver. 6. Liver mass--s/p biopsy, follow-up with GI as an outpatient. AFP not elevated.  7. DM type 2--diet controlled 8. History of CAD--ASA on hold. 9. Non-severe/moderate malnutrition  Discussed options including observation, repeat CXR in AM vs. Thoracentesis. Patient wishes to schedule procedure.  Discussed fluid may resorb spontaneously or recur after procedure.  Code Status: Full code Family Communication: discussed with daughter at bedside 12/12 Disposition Plan: home when improved  Nancy Sacks, MD  [facility] Team 6 Pager [phone] If 8PM-8AM, please contact night-coverage at [email or web address], password [facility] 09/03/2012, 8:58 AM  LOS: 5 days   Brief narrative: [name] is an 78 y.o. female with a PMH of hiatal hernia and esophageal ring requiring Savary  dilatation and diverticulosis with a tortuous colon on prior colonoscopy, under the care of Dr. Ewing Schlein, who presented with a chief complaint of melanotic stools  Taken for upper endoscopy on 08/30/2012 and Sheria Lang lesions secondary to her hiatal hernia were felt to be the cause of the bleeding. Underwent CT scan of the abdomen on 08/30/2012 which showed portal hypertension and cirrhosis of uncertain etiology as well as a liver lesion. She had an MRI on 08/31/2012 which confirmed the above noted findings. S/p liver biopsy 09/02/2012.  Consultants:  [facility]  PT--[facility] PTOT  Procedures:  12/8 EGD--bleeding probably from Cope lesions secondary to her moderate-sized hiatal hernia  12/11 liver biopsy  HPI/Subjective: Feels better today, no SOB, ambulated. However, had a "difficult" night with orthopnea. Feels uncomfortable with head of bed down, which is unusual for her.  Objective: Filed Vitals:   09/02/12 1118 09/02/12 1400 09/02/12 2155 09/03/12 0535  BP: 116/70 135/55 123/80 108/68  Pulse: 70 60 68 60  Temp: 97.9 F (36.6 C) 97.9 F (36.6 C) 98 F (36.7 C) 97.8 F (36.6 C)  TempSrc: Oral Oral Oral Oral  Resp: 18 18 18 18   Height:      Weight:      SpO2: 94% 93% 92% 96%    Intake/Output Summary (Last 24 hours) at 09/03/12 0858 Last data filed at 09/03/12 0600  Gross per 24 hour  Intake    961 ml  Output    850 ml  Net    111 ml   Filed Weights   08/29/12 1700  Weight: 82.101 kg (181 lb)    Exam:  General:  Appears calm and mildly uncomfortable and SOB Cardiovascular: RRR, no m/r/g. No LE edema. Respiratory: CTA left; right anterior clear, right posterior decreased BS. No w/r/r. Mild increased respiratory effort.  Psychiatric: grossly normal mood and affect, speech fluent and appropriate  Data  Reviewed: Basic Metabolic Panel:  Lab 09/02/12 1610 08/31/12 0435 08/30/12 0531 08/29/12 1005  NA 142 142 143 140  K 3.1* 3.6 4.2 4.2  CL 112 112 115* 106  CO2 21 22 21 23    GLUCOSE 89 79 103* 138*  BUN 14 26* 39* 30*  CREATININE 0.86 0.91 0.77 0.81  CALCIUM 8.5 8.3* 9.1 10.4  MG -- -- -- --  PHOS -- -- -- --   Liver Function Tests:  Lab 08/29/12 1005  AST 46*  ALT 30  ALKPHOS 122*  BILITOT 0.9  PROT 6.8  ALBUMIN 3.0*   CBC:  Lab 09/02/12 0419 08/31/12 0435 08/30/12 0531 08/29/12 2230 08/29/12 1505 08/29/12 1005  WBC 6.1 7.3 8.9 8.8 8.4 --  NEUTROABS -- 3.0 -- -- -- 5.8  HGB 10.6* 9.9* 10.6* 11.5* 12.1 --  HCT 30.4* 30.2* 32.3* 34.2* 36.5 --  MCV 89.7 93.5 93.6 92.4 92.2 --  PLT 126* 120* 133* 149* 149* --   CBG:  Lab 08/29/12 1451  GLUCAP 118*    Recent Results (from the past 240 hour(s))  URINE CULTURE     Status: Normal   Collection Time   08/29/12 11:02 AM      Component Value Range Status Comment   Specimen Description URINE, CLEAN CATCH   Final    Special Requests NONE   Final    Culture  Setup Time 08/29/2012 19:31   Final    Colony Count NO GROWTH   Final    Culture NO GROWTH   Final    Report Status 08/30/2012 FINAL   Final      Studies: Dg Chest 2 View  09/03/2012  *RADIOLOGY REPORT*  Clinical Data: Follow up pleural effusions.  CHEST - 2 VIEW  Comparison: 12/06/2011  Findings: Moderate cardiac enlargement noted. There is a very large hiatal hernia noted as before.  Interval increase in volume of the right pleural effusion.  Small left effusion is present.  Mild spondylosis within the thoracic spine.  IMPRESSION:  1.  Large right pleural effusion which has increased in volume from previous exam.   Original Report Authenticated By: Signa Kell, M.D.    US Biopsy  09/02/2012  *RADIOLOGY REPORT*  Clinical data:  Hypervascular liver lesion.  No known primary malignancy.  ULTRASOUND-GUIDED FNA LIVER LESION BIOPSY  Technique and findings: The procedure, risks (including but not limited to bleeding, infection, organ damage), benefits, and alternatives were explained to the patient.  Questions regarding the procedure were  encouraged and answered.  The patient understands and consents to the procedure.Survey ultrasound of the liver was performed.  The lesion was localized in the inferior aspect of the right lobe.  There is significant lesion motion with the respirations. Because of this and the peripheral location of the small lesion, coaxial core biopsy was not a viable option.  An appropriate skin entry site was determined.  Site was marked, prepped with Betadine, draped in usual sterile fashion, infiltrated locally with 1% lidocaine.  Intravenous Fentanyl and Versed were administered as conscious sedation during continuous cardiorespiratory monitoring by the radiology RN, with a total moderate sedation time of 20 minutes.  Under real time ultrasound guidance, two 20-gauge FNA biopsy samples were obtained of the lesion. These were submitted to cytopathology.  The patient tolerated procedure well.  No immediate complication.  IMPRESSION: 1.  Technically successful ultrasound-guided FNA biopsy of the right liver lesion.   Original Report Authenticated By: D. Andria Rhein, MD     Scheduled  Meds:    . atenolol  100 mg Oral Daily  . [COMPLETED] bumetanide  0.5 mg Oral Once  . cholecalciferol  1,000 Units Oral Daily  . pantoprazole  40 mg Oral BID AC  . [COMPLETED] potassium chloride  40 mEq Oral BID  . sucralfate  1 g Oral TID WC & HS   Continuous Infusions:    . sodium chloride 20 mL/hr (09/02/12 1625)    Principal Problem:  *UGIB (upper gastrointestinal bleed) Active Problems:  Hypertension  Diabetes mellitus  Coronary artery disease  Melena  Cirrhosis  Portal hypertension  Moderate malnutrition in the context of acute illness  Hiatal hernia  Sheria Lang lesion, acute  Thrombocytopenia     Nancy Sacks, MD  [facility] Team 6 Pager [phone] If 8PM-8AM, please contact night-coverage at [email or web address], password [facility] 09/03/2012, 8:58 AM  LOS: 5 days   Time spent: 30 minutes

## 2012-09-03 NOTE — Progress Notes (Signed)
Pt complaining of not being able to rest because she felt like she couldn't breath well while laying down. MD was notified. I rechecked oxygen level and it was 91 %. Pt was put on 2 L of oxygen and I raised he HOB up. Pt's oxygen level came up to 94% and pt stated she felt a little better. Will continue to monitor pt. Patsey Berthold

## 2012-09-03 NOTE — Procedures (Signed)
Successful US guided right thoracentesis. Yielded 1.5L of blood tinged fluid. Pt tolerated procedure well. No immediate complications.  Specimen was sent for labs. CXR ordered.  Brayton El PA-C 09/03/2012 10:55 AM

## 2012-09-04 ENCOUNTER — Inpatient Hospital Stay (HOSPITAL_COMMUNITY): Payer: Medicare Other

## 2012-09-04 DIAGNOSIS — K7460 Unspecified cirrhosis of liver: Secondary | ICD-10-CM

## 2012-09-04 DIAGNOSIS — C220 Liver cell carcinoma: Secondary | ICD-10-CM

## 2012-09-04 DIAGNOSIS — K769 Liver disease, unspecified: Secondary | ICD-10-CM

## 2012-09-04 LAB — CHOLESTEROL, BODY FLUID

## 2012-09-04 LAB — COMPREHENSIVE METABOLIC PANEL
Alkaline Phosphatase: 91 U/L (ref 39–117)
BUN: 12 mg/dL (ref 6–23)
Chloride: 109 mEq/L (ref 96–112)
GFR calc Af Amer: 70 mL/min — ABNORMAL LOW (ref >90)
GFR calc non Af Amer: 60 mL/min — ABNORMAL LOW (ref >90)
Glucose, Bld: 102 mg/dL — ABNORMAL HIGH (ref 70–99)
Potassium: 3.1 mEq/L — ABNORMAL LOW (ref 3.5–5.1)
Total Bilirubin: 0.8 mg/dL (ref 0.3–1.2)

## 2012-09-04 LAB — LACTATE DEHYDROGENASE: LDH: 254 U/L — ABNORMAL HIGH (ref 94–250)

## 2012-09-04 MED ORDER — MIDAZOLAM HCL 2 MG/2ML IJ SOLN
INTRAMUSCULAR | Status: AC
  Filled 2012-09-04: qty 6

## 2012-09-04 MED ORDER — MIDAZOLAM HCL 2 MG/2ML IJ SOLN
INTRAMUSCULAR | Status: AC | PRN
  Administered 2012-09-04 (×4): 0.5 mg via INTRAVENOUS

## 2012-09-04 MED ORDER — FENTANYL CITRATE 0.05 MG/ML IJ SOLN
INTRAMUSCULAR | Status: AC | PRN
  Administered 2012-09-04: 50 ug via INTRAVENOUS
  Administered 2012-09-04: 25 ug via INTRAVENOUS
  Administered 2012-09-04: 50 ug via INTRAVENOUS

## 2012-09-04 MED ORDER — ENSURE COMPLETE PO LIQD
237.0000 mL | Freq: Two times a day (BID) | ORAL | Status: DC
  Administered 2012-09-04 – 2012-09-05 (×2): 237 mL via ORAL

## 2012-09-04 MED ORDER — FENTANYL CITRATE 0.05 MG/ML IJ SOLN
INTRAMUSCULAR | Status: AC
  Filled 2012-09-04: qty 6

## 2012-09-04 MED ORDER — POTASSIUM CHLORIDE CRYS ER 20 MEQ PO TBCR
40.0000 meq | EXTENDED_RELEASE_TABLET | Freq: Two times a day (BID) | ORAL | Status: AC
  Administered 2012-09-04 (×2): 40 meq via ORAL
  Filled 2012-09-04 (×2): qty 2

## 2012-09-04 NOTE — Progress Notes (Signed)
Request to reattempt liver lesion core biopsy. Path/Cyto from FNAs done 12/11 are atypical but inadequate for definitive diagnosis. Dr. Dulce Sellar has asked for another try at the pathologist recommendation for core samples of both the lesion and normal liver tissue. Dr. Fredia Sorrow has reviewed. Labs reviewed. Pt also had thoracentesis yesterday, feeling better, cyto pending on that fluid. Pt ate some earlier, but has now been made NPO Pt agreeable to second attempt, rediscussed procedure and risks. Consent signed in chart  [name], [name]

## 2012-09-04 NOTE — Progress Notes (Addendum)
Nutrition Follow-up  Intervention: - Chocolate Ensure Complete BID - Used teach back method to educate pt and daughter on nutrition therapy for cirrhosis with an emphasis on limiting salt intake and adding calories/protein. Handouts provided. Both verbalized understanding. - Will continue to monitor   Diet Order:  CHO modified, mostly 50-90% of meals  - Noted pt with newly diagnosed cirrhosis. Pt had right thoracentesis yesterday with 1.5L of blood tinged fluid removed. Met with pt and daughter this morning who report pt breathing better today however did not eat much breakfast, just bites of oatmeal. Pt reports she has been walking more.   Meds: Scheduled Meds:   . atenolol  100 mg Oral Daily  . cholecalciferol  1,000 Units Oral Daily  . pantoprazole  40 mg Oral BID AC  . potassium chloride  40 mEq Oral BID  . sucralfate  1 g Oral TID WC & HS   Continuous Infusions:  PRN Meds:.acetaminophen, acetaminophen, albuterol, ALPRAZolam, alum & mag hydroxide-simeth, lip balm, ondansetron, oxyCODONE, promethazine   CMP     Component Value Date/Time   NA 139 09/04/2012 0655   K 3.1* 09/04/2012 0655   CL 109 09/04/2012 0655   CO2 21 09/04/2012 0655   GLUCOSE 102* 09/04/2012 0655   BUN 12 09/04/2012 0655   CREATININE 0.87 09/04/2012 0655   CALCIUM 8.4 09/04/2012 0655   PROT 5.4* 09/04/2012 0655   ALBUMIN 2.7* 09/04/2012 0655   AST 41* 09/04/2012 0655   ALT 26 09/04/2012 0655   ALKPHOS 91 09/04/2012 0655   BILITOT 0.8 09/04/2012 0655   GFRNONAA 60* 09/04/2012 0655   GFRAA 70* 09/04/2012 0655    CBG (last 3)  No results found for this basename: GLUCAP:3 in the last 72 hours   Intake/Output Summary (Last 24 hours) at 09/04/12 0841 Last data filed at 09/04/12 0834  Gross per 24 hour  Intake    840 ml  Output   1450 ml  Net   -610 ml   Last BM - 12/13  Weight Status: No new weights  Estimated needs:   1650-2050 calories 70-85g protein  Nutrition Dx: Inadequate oral intake  r/t inability to eat AEB NPO - no longer appropriate as diet advanced.   New nutrition dx: Inadequate oral intake r/t poor appetite AEB variable intake of meals.   Goal: Advance diet as tolerated to diabetic diet - met  New goal: Pt to consume >90% of meals/supplements.   Monitor: Weights, labs, BM   Levon Hedger MS, RD, [place] [phone] Pager [phone] After Hours Pager

## 2012-09-04 NOTE — Progress Notes (Signed)
PT Cancellation Note  Patient Details Name: [name] MRN: [id number] DOB: [birth year]   Cancelled Treatment:     Attempted PT tx session. Pt politely declined to participate at this time-anxious about procedure at 4:00. Requested PT check back another day. Thanks.    Rebeca Alert [facility] 09/04/2012, 2:50 PM [phone]

## 2012-09-04 NOTE — Progress Notes (Signed)
Final cytology from liver lesion biopsy (atypical cells, worrisome but not definitive for hepatoma) reviewed in detail with Dr. Dierdre Searles with pathology.  There is a single fragment of worrisome cells, but quantity is insufficient to make a definitive diagnosis of malignancy.  Patient will require CORE biopsy of BOTH the liver lesion AND neighboring liver not affected by the lesion, to better make the diagnosis and to get a better idea about the patient's underlying degree of cirrhosis (which could, in turn, impact what treatment the patient should receive).  Will discuss with Interventional Radiology, and hopefully they can do these core biopsies on Monday.  I would keep the patient in-house until we can pinpoint and definitive diagnosis.  Once a definitive diagnosis is made, patient will then need in-house surgical consult as well as formal radiology consult (if hepatoma is confirmed) to gauge patient's next best step in management.  Case discussed in detail with patient's daughter [name].  Case also discussed with Dr. Irene Limbo of [facility].

## 2012-09-04 NOTE — Progress Notes (Signed)
[facility] PROGRESS NOTE  [name] WUJ:[id number] DOB: 1927-10-19 DOA: 08/29/2012 PCP: Nancy Corwin, MD  Assessment/Plan: 1. Right pleural effusion--improved s/p thoracentesis. Asymptomatic. Check CXR in AM for baseline. 2. Liver mass--s/p biopsy, follow-up with GI as an outpatient. AFP not elevated.  3. UGIB--resolved. EGD done 08/30/2012 which showed Cameron's lesions secondary to hiatal hernia. Continue to hold aspirin. BID PPI for 6 weeks. Colonic findings on CT not thought to be significant per GI. 4. Anemia--stable. Likely multifactorial, suspect both chronic disease and acute blood loss etiologies. Follow-up as outpatient. 5. Thrombocytopenia--stable, follow as outpatient. Suspect secondary to cirrhosis. 6. Cirrhosis with portal hypertension--no evidence of varices on endoscopy. Found incidentally on CT scans of the abdomen and pelvis done 08/30/2012. Suspect non-alcoholic fatty liver. 7. DM type 2--diet controlled 8. History of CAD--ASA on hold. 9. Non-severe/moderate malnutrition  Discussed with [name]. Unfortunately core biopsy of liver lesion not possible. Random sample from liver was obtained. Cytology from pleural fluid was unremarkable. Patient wants to go home tomorrow and repeat scan as outpatient. Daughter concurs.  Code Status: Full code Family Communication: discussed with daughter at bedside 12/13 Disposition Plan: home with [facility] RN, disease management, PT, OT, 3-in-1  Nancy Sacks, MD  [facility] Team 6 Pager [phone] If 8PM-8AM, please contact night-coverage at [email or web address], password [name] 09/04/2012, 6:42 PM  LOS: 6 days   Brief narrative: [name] is an 78 y.o. female with a PMH of hiatal hernia and esophageal ring requiring Savary dilatation and diverticulosis with a tortuous colon on prior colonoscopy, under the care of [name], who presented with a chief complaint of melanotic stools  Taken for upper endoscopy on 08/30/2012  and [name] lesions secondary to her hiatal hernia were felt to be the cause of the bleeding. Underwent CT scan of the abdomen on 08/30/2012 which showed portal hypertension and cirrhosis of uncertain etiology as well as a liver lesion. She had an MRI on 08/31/2012 which confirmed the above noted findings. S/p liver biopsy 09/02/2012.  Consultants:  [name] GI  PT--HH PTOT  Procedures:  12/8 EGD--bleeding probably from [name] lesions secondary to her moderate-sized hiatal hernia  12/11 liver biopsy  12/12 US guided right thoracentesis. Yielded 1.5L of blood tinged fluid.  12/13 US guided core biopsy of liver Peripheral lesion in inferior right lobe impossible to see by ultrasound. Tremendous respiratory induced movement of liver which will also make CT guided biopsy next to impossible. I would recommend interval follow up CT in 2-3 months and only attempt a CT guided biopsy of lesion if it clearly enlarges. Random parenchymal core biopsy performed of right lobe for pathologic analysis.  [name]: Much better today, up to bathroom and ambulating without difficulty. Hopes to go home tomorrow.  Objective: Filed Vitals:   09/04/12 1654 09/04/12 1700 09/04/12 1706 09/04/12 1711  BP: 130/58 120/66 119/51 124/65  Pulse: 76 74 67 66  Temp:      TempSrc:      Resp: 26 24 26 20   Height:      Weight:      SpO2: 97% 95% 95% 97%    Intake/Output Summary (Last 24 hours) at 09/04/12 1842 Last data filed at 09/04/12 1000  Gross per 24 hour  Intake    720 ml  Output   1450 ml  Net   -730 ml   Filed Weights   08/29/12 1700  Weight: 82.101 kg (181 lb)    Exam:  General:  Appears calm and comfortable, lying nearly  flat Cardiovascular: RRR, no m/r/g.  Respiratory: CTA left, few crackles right anterior. Did not sit up. Psychiatric: grossly normal mood and affect, speech fluent and appropriate  Data Reviewed: Basic Metabolic Panel:  Lab 09/04/12 2841 09/04/12 0420 09/02/12 0419  08/31/12 0435 08/30/12 0531  NA 139 QUESTIONABLE RESULTS, RECOMMEND RECOLLECT TO VERIFY 142 142 143  K 3.1* QUESTIONABLE RESULTS, RECOMMEND RECOLLECT TO VERIFY 3.1* 3.6 4.2  CL 109 QUESTIONABLE RESULTS, RECOMMEND RECOLLECT TO VERIFY 112 112 115*  CO2 21 QUESTIONABLE RESULTS, RECOMMEND RECOLLECT TO VERIFY 21 22 21   GLUCOSE 102* QUESTIONABLE RESULTS, RECOMMEND RECOLLECT TO VERIFY 89 79 103*  BUN 12 QUESTIONABLE RESULTS, RECOMMEND RECOLLECT TO VERIFY 14 26* 39*  CREATININE 0.87 QUESTIONABLE RESULTS, RECOMMEND RECOLLECT TO VERIFY 0.86 0.91 0.77  CALCIUM 8.4 QUESTIONABLE RESULTS, RECOMMEND RECOLLECT TO VERIFY 8.5 8.3* 9.1  MG -- -- -- -- --  PHOS -- -- -- -- --   Liver Function Tests:  Lab 09/04/12 0655 09/04/12 0420 08/29/12 1005  AST 41* QUESTIONABLE RESULTS, RECOMMEND RECOLLECT TO VERIFY 46*  ALT 26 QUESTIONABLE RESULTS, RECOMMEND RECOLLECT TO VERIFY 30  ALKPHOS 91 QUESTIONABLE RESULTS, RECOMMEND RECOLLECT TO VERIFY 122*  BILITOT 0.8 QUESTIONABLE RESULTS, RECOMMEND RECOLLECT TO VERIFY 0.9  PROT 5.4* QUESTIONABLE RESULTS, RECOMMEND RECOLLECT TO VERIFY 6.8  ALBUMIN 2.7* QUESTIONABLE RESULTS, RECOMMEND RECOLLECT TO VERIFY 3.0*   CBC:  Lab 09/02/12 0419 08/31/12 0435 08/30/12 0531 08/29/12 2230 08/29/12 1505 08/29/12 1005  WBC 6.1 7.3 8.9 8.8 8.4 --  NEUTROABS -- 3.0 -- -- -- 5.8  HGB 10.6* 9.9* 10.6* 11.5* 12.1 --  HCT 30.4* 30.2* 32.3* 34.2* 36.5 --  MCV 89.7 93.5 93.6 92.4 92.2 --  PLT 126* 120* 133* 149* 149* --   CBG:  Lab 08/29/12 1451  GLUCAP 118*    Recent Results (from the past 240 hour(s))  URINE CULTURE     Status: Normal   Collection Time   08/29/12 11:02 AM      Component Value Range Status Comment   Specimen Description URINE, CLEAN CATCH   Final    Special Requests NONE   Final    Culture  Setup Time 08/29/2012 19:31   Final    Colony Count NO GROWTH   Final    Culture NO GROWTH   Final    Report Status 08/30/2012 FINAL   Final   BODY FLUID CULTURE     Status:  Normal (Preliminary result)   Collection Time   09/03/12 10:38 AM      Component Value Range Status Comment   Specimen Description PLEURAL RIGHT   Final    Special Requests Normal   Final    Gram Stain     Final    Value: RARE WBC PRESENT, PREDOMINANTLY MONONUCLEAR     NO ORGANISMS SEEN   Culture NO GROWTH   Final    Report Status PENDING   Incomplete      Studies: Dg Chest 1 View  09/03/2012  *RADIOLOGY REPORT*  Clinical Data: Right pleural effusion.  Status post thoracentesis.  CHEST - 1 VIEW  Comparison: 09/03/2012  Findings: Significant decrease in size of right pleural effusion is seen with decreased right lower lung atelectasis.  No pneumothorax identified.  Hiatal hernia again noted in the left retrocardiac region.  Left lung is otherwise clear.  Heart size is stable.  IMPRESSION:  1.  Decreased right pleural effusion and right lower lung atelectasis following thoracentesis.  No pneumothorax identified. 2.  Hiatal hernia.   Original Report  Authenticated By: Myles Rosenthal, M.D.    Dg Chest 2 View  09/03/2012  *RADIOLOGY REPORT*  Clinical Data: Follow up pleural effusions.  CHEST - 2 VIEW  Comparison: 12/06/2011  Findings: Moderate cardiac enlargement noted. There is a very large hiatal hernia noted as before.  Interval increase in volume of the right pleural effusion.  Small left effusion is present.  Mild spondylosis within the thoracic spine.  IMPRESSION:  1.  Large right pleural effusion which has increased in volume from previous exam.   Original Report Authenticated By: Signa Kell, M.D.    US Biopsy  09/04/2012  *RADIOLOGY REPORT*  Clinical Data: Possible cirrhosis and small peripheral lesion in the right lobe of the liver.  Status post prior needle aspirate biopsy in the region of the liver lesion on 09/02/2012.  Request has been made to try to obtain core biopsy specimens to facilitate diagnosis and also a core specimen of adjacent liver to evaluate for possible underlying  cirrhotic disease.  ULTRASOUND GUIDED CORE BIOPSY OF LIVER  Sedation:  2.0 mg IV Versed;  125 mcg IV Fentanyl  Total Moderate Sedation Time: 25 minutes.  Procedure:  The procedure, risks, benefits, and alternatives were explained to the patient.  Questions regarding the procedure were encouraged and answered.  The patient understands and consents to the procedure.  The abdominal wall was prepped with Betadine in a sterile fashion, and a sterile drape was applied covering the operative field.  A sterile gown and sterile gloves were used for the procedure. Local anesthesia was provided with 1% Lidocaine.  Sonographic evaluation of the liver was performed under real-time imaging.  A 17 gauge needle was advanced randomly into the right lobe of the liver.  Two intact 18 gauge core biopsy samples were obtained of liver parenchyma and submitted in formalin. Gelfoam pledgets were injected via the outer needle as the needle was retracted from the liver.  Complications: None  Findings: Thorough evaluation of the liver was performed with ultrasound.  The inferior right hepatic lesion seen by both CT and MRI is not reproducibly visible by ultrasound.  As noted on the prior biopsy procedure, there is a tremendous amount of respiratory excursion of the liver which makes it very difficult to obtain an accurate biopsy and would also make CT guided biopsy next to impossible given that the patient cannot reproducibly hold her breath.  Random biopsy was successful in obtaining tissue from the liver for pathologic analysis.  IMPRESSION:  1.  Ultrasound guided random core biopsy of the right lobe was successfully accomplished today to evaluate for underlying cirrhosis/parenchymal liver disease. 2.  The small peripheral lesion visible by CT and MRI is occult by ultrasound and therefore cannot be accurately sampled under ultrasound guidance.  CT guided biopsy would also be quite difficult due to tremendous respiratory excursion of the  liver.  I would recommend a follow-up cross-sectional imaging study with either CT or MRI in roughly 2-3 months to evaluate for lesion growth prior to reconsidering biopsy.   Original Report Authenticated By: Irish Lack, M.D.    US Thoracentesis Asp Pleural Space W/img Guide  09/03/2012  *RADIOLOGY REPORT*  Clinical Data:  Symptomatic right pleural effusion  ULTRASOUND GUIDED right THORACENTESIS  Comparison:  None  An ultrasound guided thoracentesis was thoroughly discussed with the patient and questions answered.  The benefits, risks, alternatives and complications were also discussed.  The patient understands and wishes to proceed with the procedure.  Written consent was obtained.  Ultrasound was  performed to localize and mark an adequate pocket of fluid in the right chest.  The area was then prepped and draped in the normal sterile fashion.  1% Lidocaine was used for local anesthesia.  Under ultrasound guidance a 19 gauge Yueh catheter was introduced.  Thoracentesis was performed.  The catheter was removed and a dressing applied.  Complications:  None immediate  Findings: A total of approximately 1.5 liters of blood tinged fluid was removed. A fluid sample was sent for laboratory analysis.  IMPRESSION: Successful ultrasound guided right thoracentesis yielding 1.5 liters of pleural fluid.  Read by Brayton El PA-C   Original Report Authenticated By: D. Andria Rhein, MD     Scheduled Meds:    . atenolol  100 mg Oral Daily  . cholecalciferol  1,000 Units Oral Daily  . feeding supplement  237 mL Oral BID BM  . fentaNYL      . midazolam      . pantoprazole  40 mg Oral BID AC  . potassium chloride  40 mEq Oral BID  . sucralfate  1 g Oral TID WC & HS   Continuous Infusions:    Principal Problem:  *UGIB (upper gastrointestinal bleed) Active Problems:  Hypertension  Diabetes mellitus  Coronary artery disease  Melena  Cirrhosis  Portal hypertension  Moderate malnutrition in the context  of acute illness  Hiatal hernia  [name] lesion, acute  Thrombocytopenia  Pleural effusion on right     Nancy Sacks, MD  [facility] Team 6 Pager [phone] If 8PM-8AM, please contact night-coverage at [email or web address], password [facility] 09/04/2012, 6:42 PM  LOS: 6 days   Time spent: 20 minutes

## 2012-09-04 NOTE — Procedures (Signed)
Procedure:  US guided core biopsy of liver Findings:  Peripheral lesion in inferior right lobe impossible to see by ultrasound.  Tremendous respiratory induced movement of liver which will also make CT guided biopsy next to impossible.  I would recommend interval follow up CT in 2-3 months and only attempt a CT guided biopsy of lesion if it clearly enlarges. Random parenchymal core biopsy performed of right lobe for pathologic analysis. No complications. Plan:  3 hour bedrest

## 2012-09-05 ENCOUNTER — Inpatient Hospital Stay (HOSPITAL_COMMUNITY): Payer: Medicare Other

## 2012-09-05 NOTE — Progress Notes (Signed)
[facility] Progress Note  Subjective: Patient complaining of some new diarrhea, nonbloody and without pain. Tolerated liver biopsy yesterday without any increased pain. Somewhat anorexic  Objective: Vital signs in last 24 hours: Temp:  [98 F (36.7 C)-98.5 F (36.9 C)] 98.1 F (36.7 C) (12/14 0600) Pulse Rate:  [60-76] 69  (12/14 0600) Resp:  [18-30] 18  (12/14 0600) BP: (106-179)/(42-80) 106/67 mmHg (12/14 0600) SpO2:  [93 %-100 %] 93 % (12/14 0600) Weight change:    PE: Abdomen soft nondistended with normoactive bowel sounds.  Lab Results: No results found for this or any previous visit (from the past 24 hour(s)).  Studies/Results: Dg Chest 1 View  09/03/2012  *RADIOLOGY REPORT*  Clinical Data: Right pleural effusion.  Status post thoracentesis.  CHEST - 1 VIEW  Comparison: 09/03/2012  Findings: Significant decrease in size of right pleural effusion is seen with decreased right lower lung atelectasis.  No pneumothorax identified.  Hiatal hernia again noted in the left retrocardiac region.  Left lung is otherwise clear.  Heart size is stable.  IMPRESSION:  1.  Decreased right pleural effusion and right lower lung atelectasis following thoracentesis.  No pneumothorax identified. 2.  Hiatal hernia.   Original Report Authenticated By: Myles Rosenthal, M.D.    US Biopsy  09/04/2012  *RADIOLOGY REPORT*  Clinical Data: Possible cirrhosis and small peripheral lesion in the right lobe of the liver.  Status post prior needle aspirate biopsy in the region of the liver lesion on 09/02/2012.  Request has been made to try to obtain core biopsy specimens to facilitate diagnosis and also a core specimen of adjacent liver to evaluate for possible underlying cirrhotic disease.  ULTRASOUND GUIDED CORE BIOPSY OF LIVER  Sedation:  2.0 mg IV Versed;  125 mcg IV Fentanyl  Total Moderate Sedation Time: 25 minutes.  Procedure:  The procedure, risks, benefits, and alternatives were explained to the  patient.  Questions regarding the procedure were encouraged and answered.  The patient understands and consents to the procedure.  The abdominal wall was prepped with Betadine in a sterile fashion, and a sterile drape was applied covering the operative field.  A sterile gown and sterile gloves were used for the procedure. Local anesthesia was provided with 1% Lidocaine.  Sonographic evaluation of the liver was performed under real-time imaging.  A 17 gauge needle was advanced randomly into the right lobe of the liver.  Two intact 18 gauge core biopsy samples were obtained of liver parenchyma and submitted in formalin. Gelfoam pledgets were injected via the outer needle as the needle was retracted from the liver.  Complications: None  Findings: Thorough evaluation of the liver was performed with ultrasound.  The inferior right hepatic lesion seen by both CT and MRI is not reproducibly visible by ultrasound.  As noted on the prior biopsy procedure, there is a tremendous amount of respiratory excursion of the liver which makes it very difficult to obtain an accurate biopsy and would also make CT guided biopsy next to impossible given that the patient cannot reproducibly hold her breath.  Random biopsy was successful in obtaining tissue from the liver for pathologic analysis.  IMPRESSION:  1.  Ultrasound guided random core biopsy of the right lobe was successfully accomplished today to evaluate for underlying cirrhosis/parenchymal liver disease. 2.  The small peripheral lesion visible by CT and MRI is occult by ultrasound and therefore cannot be accurately sampled under ultrasound guidance.  CT guided biopsy would also be quite difficult due to tremendous  respiratory excursion of the liver.  I would recommend a follow-up cross-sectional imaging study with either CT or MRI in roughly 2-3 months to evaluate for lesion growth prior to reconsidering biopsy.   Original Report Authenticated By: Irish Lack, M.D.    US  Thoracentesis Asp Pleural Space W/img Guide  09/03/2012  *RADIOLOGY REPORT*  Clinical Data:  Symptomatic right pleural effusion  ULTRASOUND GUIDED right THORACENTESIS  Comparison:  None  An ultrasound guided thoracentesis was thoroughly discussed with the patient and questions answered.  The benefits, risks, alternatives and complications were also discussed.  The patient understands and wishes to proceed with the procedure.  Written consent was obtained.  Ultrasound was performed to localize and mark an adequate pocket of fluid in the right chest.  The area was then prepped and draped in the normal sterile fashion.  1% Lidocaine was used for local anesthesia.  Under ultrasound guidance a 19 gauge Yueh catheter was introduced.  Thoracentesis was performed.  The catheter was removed and a dressing applied.  Complications:  None immediate  Findings: A total of approximately 1.5 liters of blood tinged fluid was removed. A fluid sample was sent for laboratory analysis.  IMPRESSION: Successful ultrasound guided right thoracentesis yielding 1.5 liters of pleural fluid.  Read by Brayton El PA-C   Original Report Authenticated By: D. Andria Rhein, MD       Assessment: Incidentally discovered cirrhosis GI bleeding felt do to [facility] erosions of the upper GI tract, currently stable 1. Indeterminant hepatic lesion unable to be successfully sampled by biopsy yesterday  1. Plan: 1. Followup MRI or CT recommended in 3 months 2. Continue PPI 3. Await core liver biopsy of liver for presumed cirrhosis 4. Followup with Dr. Ewing Schlein when discharged     Natelie Ostrosky C 09/05/2012, 8:52 AM

## 2012-09-05 NOTE — Discharge Summary (Signed)
Physician Discharge Summary  [name] AVW:[id number] DOB: 1928-08-10 DOA: 08/29/2012  PCP: Nadean Corwin, MD  Admit date: 08/29/2012 Discharge date: 09/05/2012  Recommendations for Outpatient Follow-up:  1. Follow-up liver mass, random biopsy, recommendations below 2. Follow-up right pleural effusion (transudate) 3. Follow-up new diagnosis of cirrhosis 4. Follow-up GIB, anemia, thrombocytopenia 5. HH PT OT RN   Follow-up Information    Follow up with [facility] E, MD. In 3 weeks.   Contact information:   [address]                         [place] [address] [phone]       Follow up with MCKEOWN,WILLIAM DAVID, MD. In 1 week.   Contact information:   [address] [place] [address] [phone]         Discharge Diagnoses:  1. Right pleural effusion 2. Liver mass 3. UGIB 4. Cameron's lesions  5. Anemia 6. Thrombocytopenia 7. Cirrhosis with portal hypertension 8. DM type 2 9. History of CAD 10. Non-severe/moderate malnutrition  Discharge Condition: improved Disposition: home with [facility] PT OT RN  Diet recommendation: diabetic low salt diet  Filed Weights   08/29/12 1700  Weight: 82.101 kg (181 lb)    History of present illness:  22 yow with presented with a chief complaint of melanotic stools  Hospital Course:  [name] was seen by GI for melanotic stools and was taken for upper endoscopy on 08/30/2012 which revealed Sheria Lang lesions secondary to her hiatal hernia which were felt to be the cause of the bleeding. She underwent a CT scan of the abdomen on 08/30/2012 which showed portal hypertension and cirrhosis of uncertain etiology as well as a liver lesion. She had an MRI on 08/31/2012 which confirmed the above noted findings. Liver biopsy was pursued, pathology was suspicious for malignancy but not definitive. Repeat biopsy with core samples was attempted 12/13 but sample of lesion could not be obtained  (see below under Procedures). Surveillance was recommended with repeat imaging in 2-3 months. While hospitalized developed a right pleural effusion which slowly worsened and became symptomatic. S/p thoracentesis was much improved, repeat imaging prior to discharge showed stability of effusion. Fluid analysis was consistent with transudate and cytology of pleural fluid and culture were unremarkable. Presumed secondary to cirrhosis. Patient will follow-up with GI as an outpatient.  1. Right pleural effusion--asymptomatic. Stable by x-ray.  2. Liver mass--s/p random biopsy, could not obtain core biopsy of lesion. Follow-up CT or MRI 2-3 monhts and with GI as an outpatient. AFP not elevated.    3. UGIB--resolved. EGD done 08/30/2012 which showed Cameron's lesions secondary to hiatal hernia. Continue to hold aspirin. BID PPI for 6 weeks. Colonic findings on CT not thought to be significant per GI.  4. Anemia--stable. Likely multifactorial, suspect both chronic disease and acute blood loss etiologies. Follow-up as outpatient.  5. Thrombocytopenia--stable, follow as outpatient. Suspect secondary to cirrhosis.  6. Cirrhosis with portal hypertension--no evidence of varices on endoscopy. Found incidentally on CT scans of the abdomen and pelvis done 08/30/2012. Suspect non-alcoholic fatty liver.  7. DM type 2--diet controlled  8. History of CAD--ASA on hold.  9. Non-severe/moderate malnutrition  Consultants:  [facility]   PT--HH PTOT  Procedures:  12/8 EGD--bleeding probably from Sigel lesions secondary to her moderate-sized hiatal hernia   12/11 liver biopsy   12/12 US guided right thoracentesis. Yielded 1.5L of blood tinged fluid.  12/13  US guided core biopsy of liver "Peripheral lesion in inferior right lobe impossible to see by ultrasound. Tremendous respiratory induced movement of liver which will also make CT guided biopsy next to impossible. I would recommend interval follow up CT in 2-3 months  and only attempt a CT guided biopsy of lesion if it clearly enlarges. Random parenchymal core biopsy performed of right lobe for pathologic analysis."--per IR  Discharge Instructions  Discharge Orders    Future Appointments: Provider: Department: Dept Phone: Center:   09/08/2012 4:10 PM [facility] [phone] [facility]     Future Orders Please Complete By Expires   For home use only DME 3 n 1      Diet - low sodium heart healthy      Diet Carb Modified      Increase activity slowly      Discharge instructions      Comments:   Do not take aspirin until advised to do so by Dr. Ewing Schlein. Call physician or seek immediate medical attention for shortness of breath, wheezing, difficulty lying flat or worsening of condition.   Home Health      Questions: Responses:   To provide the following care/treatments RN    PT    OT   Face-to-face encounter      Comments:   I Brendia Sacks certify that this patient is under my care and that I, or a nurse practitioner or physician's assistant working with me, had a face-to-face encounter that meets the physician face-to-face encounter requirements with this patient on 09/05/2012. The encounter with the patient was in whole, or in part for the following medical condition(s) which is the primary reason for home health care (List medical condition): GIB, cirrhosis, right pleural effusion   Questions: Responses:   The encounter with the patient was in whole, or in part, for the following medical condition, which is the primary reason for home health care GIB, cirrhosis, right pleural effusion   I certify that, based on my findings, the following services are medically necessary home health services Physical therapy   My clinical findings support the need for the above services Leaving home exacerbates symptoms (pain, dyspnea, anxiety etc.)   Further, I certify that my clinical findings support that this patient is  homebound due to: Leaving home exacerbates symptoms (dyspnea, pain, anxiety, etc)   To provide the following care/treatments RN    PT    OT       Medication List     As of 09/05/2012 11:50 AM    STOP taking these medications         aspirin EC 81 MG tablet      TAKE these medications         ALPRAZolam 0.25 MG tablet   Commonly known as: XANAX   Take 0.25 mg by mouth at bedtime as needed. ANXIETY.      atenolol 100 MG tablet   Commonly known as: TENORMIN   Take 100 mg by mouth daily.      bumetanide 0.5 MG tablet   Commonly known as: BUMEX   Take 0.5 mg by mouth continuous as needed. Edema      cholecalciferol 1000 UNITS tablet   Commonly known as: VITAMIN D   Take 1,000 Units by mouth daily.      cyclobenzaprine 10 MG tablet   Commonly known as: FLEXERIL   Take 10 mg by mouth 2 (two) times daily as  needed. MUSCLE SPASM.      NITROSTAT 0.4 MG SL tablet   Generic drug: nitroGLYCERIN   Place 0.4 mg under the tongue.      omeprazole 40 MG capsule   Commonly known as: PRILOSEC   Take 40 mg by mouth 2 (two) times daily.      triamcinolone cream 0.1 %   Commonly known as: KENALOG   Apply 1 application topically as needed. Eczema       The results of significant diagnostics from this hospitalization (including imaging, microbiology, ancillary and laboratory) are listed below for reference.    Significant Diagnostic Studies: Dg Chest 1 View  09/03/2012  *RADIOLOGY REPORT*  Clinical Data: Right pleural effusion.  Status post thoracentesis.  CHEST - 1 VIEW  Comparison: 09/03/2012  Findings: Significant decrease in size of right pleural effusion is seen with decreased right lower lung atelectasis.  No pneumothorax identified.  Hiatal hernia again noted in the left retrocardiac region.  Left lung is otherwise clear.  Heart size is stable.  IMPRESSION:  1.  Decreased right pleural effusion and right lower lung atelectasis following thoracentesis.  No pneumothorax  identified. 2.  Hiatal hernia.   Original Report Authenticated By: Myles Rosenthal, M.D.    Dg Chest 2 View  09/05/2012  *RADIOLOGY REPORT*  Clinical Data: Follow-up right pleural effusion  CHEST - 2 VIEW  Comparison: 09/03/2012  Findings: Small to moderate right pleural effusion, unchanged. Underlying chronic interstitial markings.  No pneumothorax.  The heart is normal in size.  Mild degenerative changes of the visualized thoracolumbar spine.  IMPRESSION: Small to moderate right pleural effusion, unchanged.   Original Report Authenticated By: Charline Bills, M.D.    Dg Chest 2 View  09/03/2012  *RADIOLOGY REPORT*  Clinical Data: Follow up pleural effusions.  CHEST - 2 VIEW  Comparison: 12/06/2011  Findings: Moderate cardiac enlargement noted. There is a very large hiatal hernia noted as before.  Interval increase in volume of the right pleural effusion.  Small left effusion is present.  Mild spondylosis within the thoracic spine.  IMPRESSION:  1.  Large right pleural effusion which has increased in volume from previous exam.   Original Report Authenticated By: Signa Kell, M.D.    Mr Abdomen W Wo Contrast  09/01/2012  *RADIOLOGY REPORT*  Clinical Data: Cirrhosis with indeterminate liver lesion on CT. Recent serum alpha-fetoprotein levels were not elevated.  MRI ABDOMEN WITH AND WITHOUT CONTRAST  Technique:  Multiplanar multisequence MR imaging of the abdomen was performed both before and after administration of intravenous contrast.  Contrast: 17mL MULTIHANCE GADOBENATE DIMEGLUMINE 529 MG/ML IV SOLN  Comparison: Abdominal pelvic CT 08/30/2012 and 03/15/2009.  Findings: The recently demonstrated lesion inferiorly in the right hepatic lobe is best seen post contrast and shows intense homogeneous enhancement on the arterial phase images.  This lesion measures 1.7 x 1.4 cm on the image 62 of series 1101.  On the more delayed postcontrast images, there is hypointensity of this lesion with respect to the  surrounding hepatic parenchyma.  There is a rim of hyperintensity. The lesion demonstrates mildly decreased T1 and increased T2 signal.  No other focal liver lesions are identified. The hepatic contours are diffusely irregular consistent with cirrhosis.  There is a moderate sized right pleural effusion which has slightly enlarged compared with the prior CT.  There is associated atelectasis at the right lung base.  A large hiatal hernia is again noted with most of the stomach in the lower left chest.  The  spleen is normal in size.  There is stable accessory splenic tissue.  No significant abnormalities of the gallbladder, biliary system, pancreas, adrenal glands or kidneys are seen.  The splenic and portal veins are patent.  There is ascites with asymmetric fluid in the subhepatic space and around the descending duodenum.  This is similar to the prior CT.  Small lymph nodes in the porta hepatis are stable.  Previously demonstrated colonic wall thickening is less evident.  There is stable aortic atherosclerosis.  IMPRESSION:  1.  1.7 cm lesion inferiorly in the right hepatic lobe demonstrates intense arterial phase enhancement and is concerning for a hypervascular metastasis or hepatocellular carcinoma in the setting of cirrhosis. Tissue sampling is recommended. 2.  Right pleural effusion and ascites presumably secondary to cirrhosis/liver disease.  No specific evidence of pancreatitis. 3. Stable large hiatal hernia.  No significant residual colonic wall thickening identified.   Original Report Authenticated By: Carey Bullocks, M.D.    Ct Abdomen Pelvis W Contrast  08/30/2012  *RADIOLOGY REPORT*  Clinical Data: GI bleeding.  CT ABDOMEN AND PELVIS WITH CONTRAST  Technique:  Multidetector CT imaging of the abdomen and pelvis was performed following the standard protocol during bolus administration of intravenous contrast.  Contrast: OMNIPAQUE IOHEXOL 300 MG/ML  SOLN  Comparison: 03/15/2009.  Findings: The lung  bases demonstrate a right-sided pleural effusion with overlying atelectasis.  There is also left basilar atelectasis.  The patient had a large hiatal hernia with most of the stomach up in the chest.  There are cirrhotic changes involving the liver with a small amount of fluid around the liver. There is a 13mm indeterminate lesion in the inferior aspect of the right hepatic lobe.  MRI is recommended for further evaluation and recommend correlation with alpha- fetoprotein level.  There is fluid around the gallbladder which is likely due to cirrhosis and ascites.  The common bile duct is normal in caliber.  The pancreas is normal.  The spleen is normal in size.  No focal splenic lesions. A stable accessory spleen is noted.  The adrenal glands and kidneys are unremarkable.  The portal and splenic veins are patent. Portal venous collaterals are noted.  The aorta is normal in caliber.  Moderate atherosclerotic calcifications.  The major branch vessels are patent.  The stomach is not well distended with contrast but no gross abnormalities are seen.  The duodenum is unremarkable.  The small bowel demonstrates areas of suspected mild wall thickening and slightly prominent mucosal folds.  The colon demonstrates diffuse colonic wall thickening and submucosal edema.  Findings suggest diffuse enterocolitis.  Some of this could be due to cirrhosis with low albumin and ascites.  An infectious or inflammatory process is also possible.  The uterus is surgically absent.  There is a small amount of free pelvic fluid.  The bladder is normal.  No pelvic mass or adenopathy.  IMPRESSION:  1.  Cirrhosis with portal hypertension and portal venous collaterals.  No splenomegaly but there is a small amount of ascites. 2.  13 mm right hepatic lobe liver lesion inferiorly is indeterminate.  Recommend follow-up MRI examination and correlation with alpha-fetoprotein level.  3.  Diffuse colonic wall thickening and to a lesser extent the small bowel  wall thickening.  Findings could suggest changes related to cirrhosis, ascites and low albumin.  An infectious or inflammatory process is also possible.   Original Report Authenticated By: Rudie Meyer, M.D.    Microbiology: Recent Results (from the past 240 hour(s))  URINE CULTURE     Status: Normal   Collection Time   08/29/12 11:02 AM      Component Value Range Status Comment   Specimen Description URINE, CLEAN CATCH   Final    Special Requests NONE   Final    Culture  Setup Time 08/29/2012 19:31   Final    Colony Count NO GROWTH   Final    Culture NO GROWTH   Final    Report Status 08/30/2012 FINAL   Final   BODY FLUID CULTURE     Status: Normal (Preliminary result)   Collection Time   09/03/12 10:38 AM      Component Value Range Status Comment   Specimen Description PLEURAL RIGHT   Final    Special Requests Normal   Final    Gram Stain     Final    Value: RARE WBC PRESENT, PREDOMINANTLY MONONUCLEAR     NO ORGANISMS SEEN   Culture NO GROWTH   Final    Report Status PENDING   Incomplete      Labs: Basic Metabolic Panel:  Lab 09/04/12 1610 09/04/12 0420 09/02/12 0419 08/31/12 0435 08/30/12 0531  NA 139 QUESTIONABLE RESULTS, RECOMMEND RECOLLECT TO VERIFY 142 142 143  K 3.1* QUESTIONABLE RESULTS, RECOMMEND RECOLLECT TO VERIFY 3.1* 3.6 4.2  CL 109 QUESTIONABLE RESULTS, RECOMMEND RECOLLECT TO VERIFY 112 112 115*  CO2 21 QUESTIONABLE RESULTS, RECOMMEND RECOLLECT TO VERIFY 21 22 21   GLUCOSE 102* QUESTIONABLE RESULTS, RECOMMEND RECOLLECT TO VERIFY 89 79 103*  BUN 12 QUESTIONABLE RESULTS, RECOMMEND RECOLLECT TO VERIFY 14 26* 39*  CREATININE 0.87 QUESTIONABLE RESULTS, RECOMMEND RECOLLECT TO VERIFY 0.86 0.91 0.77  CALCIUM 8.4 QUESTIONABLE RESULTS, RECOMMEND RECOLLECT TO VERIFY 8.5 8.3* 9.1  MG -- -- -- -- --  PHOS -- -- -- -- --   Liver Function Tests:  Lab 09/04/12 0655 09/04/12 0420  AST 41* QUESTIONABLE RESULTS, RECOMMEND RECOLLECT TO VERIFY  ALT 26 QUESTIONABLE RESULTS,  RECOMMEND RECOLLECT TO VERIFY  ALKPHOS 91 QUESTIONABLE RESULTS, RECOMMEND RECOLLECT TO VERIFY  BILITOT 0.8 QUESTIONABLE RESULTS, RECOMMEND RECOLLECT TO VERIFY  PROT 5.4* QUESTIONABLE RESULTS, RECOMMEND RECOLLECT TO VERIFY  ALBUMIN 2.7* QUESTIONABLE RESULTS, RECOMMEND RECOLLECT TO VERIFY   CBC:  Lab 09/02/12 0419 08/31/12 0435 08/30/12 0531 08/29/12 2230 08/29/12 1505  WBC 6.1 7.3 8.9 8.8 8.4  NEUTROABS -- 3.0 -- -- --  HGB 10.6* 9.9* 10.6* 11.5* 12.1  HCT 30.4* 30.2* 32.3* 34.2* 36.5  MCV 89.7 93.5 93.6 92.4 92.2  PLT 126* 120* 133* 149* 149*    Principal Problem:  *UGIB (upper gastrointestinal bleed) Active Problems:  Hypertension  Diabetes mellitus  Coronary artery disease  Melena  Cirrhosis  Portal hypertension  Moderate malnutrition in the context of acute illness  Hiatal hernia  Cameron lesion, acute  Thrombocytopenia  Pleural effusion on right  Liver mass   Time coordinating discharge: 35 minutes  Signed:  Brendia Sacks, MD [facility] 09/05/2012, 11:50 AM

## 2012-09-05 NOTE — Progress Notes (Signed)
[facility] PROGRESS NOTE  [name] JYN:[id number] DOB: August 04, 1928 DOA: 08/29/2012 PCP: Nadean Corwin, MD GI: Dr. Ewing Schlein  Assessment/Plan: 1. Right pleural effusion--asymptomatic. Stable by x-ray. 2. Liver mass--s/p random biopsy, could not obtain core biopsy of lesion. Follow-up CT or MRI 2-3 monhts and with GI as an outpatient. AFP not elevated.  3. UGIB--resolved. EGD done 08/30/2012 which showed Cameron's lesions secondary to hiatal hernia. Continue to hold aspirin. BID PPI for 6 weeks. Colonic findings on CT not thought to be significant per GI. 4. Anemia--stable. Likely multifactorial, suspect both chronic disease and acute blood loss etiologies. Follow-up as outpatient. 5. Thrombocytopenia--stable, follow as outpatient. Suspect secondary to cirrhosis. 6. Cirrhosis with portal hypertension--no evidence of varices on endoscopy. Found incidentally on CT scans of the abdomen and pelvis done 08/30/2012. Suspect non-alcoholic fatty liver. 7. DM type 2--diet controlled 8. History of CAD--ASA on hold. 9. Non-severe/moderate malnutrition  Discussed with patient and daughter [name] at bedside, showed x-rays to both and discussed radiologist interpretation. Patient asymptomatic, doing well, walking the halls and without hypoxia. Discussed that fluid could increase resulting in respiratory compromise but that there is little to gain by further hospitalization, both agree.  Code Status: Full code Family Communication: as above Disposition Plan: home with [facility] RN, disease management, PT, OT, 3-in-1  Brendia Sacks, MD  [facility] Team 6 Pager [phone] If 8PM-8AM, please contact night-coverage at [email or web address], password [facility] 09/05/2012, 11:35 AM  LOS: 7 days   Brief narrative: [name] is an 78 y.o. female with a PMH of hiatal hernia and esophageal ring requiring Savary dilatation and diverticulosis with a tortuous colon on prior colonoscopy, under the care of Dr.  Ewing Schlein, who presented with a chief complaint of melanotic stools  Taken for upper endoscopy on 08/30/2012 and Sheria Lang lesions secondary to her hiatal hernia were felt to be the cause of the bleeding. Underwent CT scan of the abdomen on 08/30/2012 which showed portal hypertension and cirrhosis of uncertain etiology as well as a liver lesion. She had an MRI on 08/31/2012 which confirmed the above noted findings. S/p liver biopsy 09/02/2012.  Consultants:  [facility]  PT--[facility] PTOT  Procedures:  12/8 EGD--bleeding probably from [place] lesions secondary to her moderate-sized hiatal hernia  12/11 liver biopsy  12/12 US guided right thoracentesis. Yielded 1.5L of blood tinged fluid.  12/13 US guided core biopsy of liver Peripheral lesion in inferior right lobe impossible to see by ultrasound. Tremendous respiratory induced movement of liver which will also make CT guided biopsy next to impossible. I would recommend interval follow up CT in 2-3 months and only attempt a CT guided biopsy of lesion if it clearly enlarges. Random parenchymal core biopsy performed of right lobe for pathologic analysis.  HPI/Subjective: Doing well, breathing well, up to bathroom without difficulty, ready to go home.  Objective: Filed Vitals:   09/04/12 1711 09/04/12 2100 09/05/12 0200 09/05/12 0600  BP: 124/65 128/60 110/42 106/67  Pulse: 66 60 75 69  Temp:  98.1 F (36.7 C) 98.5 F (36.9 C) 98.1 F (36.7 C)  TempSrc:  Oral Oral Oral  Resp: 20 18 18 18   Height:      Weight:      SpO2: 97% 100% 96% 93%    Intake/Output Summary (Last 24 hours) at 09/05/12 1135 Last data filed at 09/05/12 0500  Gross per 24 hour  Intake     80 ml  Output    250 ml  Net   -170 ml  Filed Weights   08/29/12 1700  Weight: 82.101 kg (181 lb)    Exam:  General:  Appears calm and comfortable Cardiovascular: RRR, no m/r/g.  Respiratory: CTA left, few crackles right posterior. No wheezes or rhonchi, normal respiratory  effort.  Psychiatric: grossly normal mood and affect, speech fluent and appropriate  Data Reviewed: Basic Metabolic Panel:  Lab 09/04/12 7829 09/04/12 0420 09/02/12 0419 08/31/12 0435 08/30/12 0531  NA 139 QUESTIONABLE RESULTS, RECOMMEND RECOLLECT TO VERIFY 142 142 143  K 3.1* QUESTIONABLE RESULTS, RECOMMEND RECOLLECT TO VERIFY 3.1* 3.6 4.2  CL 109 QUESTIONABLE RESULTS, RECOMMEND RECOLLECT TO VERIFY 112 112 115*  CO2 21 QUESTIONABLE RESULTS, RECOMMEND RECOLLECT TO VERIFY 21 22 21   GLUCOSE 102* QUESTIONABLE RESULTS, RECOMMEND RECOLLECT TO VERIFY 89 79 103*  BUN 12 QUESTIONABLE RESULTS, RECOMMEND RECOLLECT TO VERIFY 14 26* 39*  CREATININE 0.87 QUESTIONABLE RESULTS, RECOMMEND RECOLLECT TO VERIFY 0.86 0.91 0.77  CALCIUM 8.4 QUESTIONABLE RESULTS, RECOMMEND RECOLLECT TO VERIFY 8.5 8.3* 9.1  MG -- -- -- -- --  PHOS -- -- -- -- --   Liver Function Tests:  Lab 09/04/12 0655 09/04/12 0420  AST 41* QUESTIONABLE RESULTS, RECOMMEND RECOLLECT TO VERIFY  ALT 26 QUESTIONABLE RESULTS, RECOMMEND RECOLLECT TO VERIFY  ALKPHOS 91 QUESTIONABLE RESULTS, RECOMMEND RECOLLECT TO VERIFY  BILITOT 0.8 QUESTIONABLE RESULTS, RECOMMEND RECOLLECT TO VERIFY  PROT 5.4* QUESTIONABLE RESULTS, RECOMMEND RECOLLECT TO VERIFY  ALBUMIN 2.7* QUESTIONABLE RESULTS, RECOMMEND RECOLLECT TO VERIFY   CBC:  Lab 09/02/12 0419 08/31/12 0435 08/30/12 0531 08/29/12 2230 08/29/12 1505  WBC 6.1 7.3 8.9 8.8 8.4  NEUTROABS -- 3.0 -- -- --  HGB 10.6* 9.9* 10.6* 11.5* 12.1  HCT 30.4* 30.2* 32.3* 34.2* 36.5  MCV 89.7 93.5 93.6 92.4 92.2  PLT 126* 120* 133* 149* 149*   CBG:  Lab 08/29/12 1451  GLUCAP 118*    Recent Results (from the past 240 hour(s))  URINE CULTURE     Status: Normal   Collection Time   08/29/12 11:02 AM      Component Value Range Status Comment   Specimen Description URINE, CLEAN CATCH   Final    Special Requests NONE   Final    Culture  Setup Time 08/29/2012 19:31   Final    Colony Count NO GROWTH   Final     Culture NO GROWTH   Final    Report Status 08/30/2012 FINAL   Final   BODY FLUID CULTURE     Status: Normal (Preliminary result)   Collection Time   09/03/12 10:38 AM      Component Value Range Status Comment   Specimen Description PLEURAL RIGHT   Final    Special Requests Normal   Final    Gram Stain     Final    Value: RARE WBC PRESENT, PREDOMINANTLY MONONUCLEAR     NO ORGANISMS SEEN   Culture NO GROWTH   Final    Report Status PENDING   Incomplete      Studies: Dg Chest 2 View  09/05/2012  *RADIOLOGY REPORT*  Clinical Data: Follow-up right pleural effusion  CHEST - 2 VIEW  Comparison: 09/03/2012  Findings: Small to moderate right pleural effusion, unchanged. Underlying chronic interstitial markings.  No pneumothorax.  The heart is normal in size.  Mild degenerative changes of the visualized thoracolumbar spine.  IMPRESSION: Small to moderate right pleural effusion, unchanged.   Original Report Authenticated By: Charline Bills, M.D.    US Biopsy  09/04/2012  *RADIOLOGY REPORT*  Clinical Data: Possible  cirrhosis and small peripheral lesion in the right lobe of the liver.  Status post prior needle aspirate biopsy in the region of the liver lesion on 09/02/2012.  Request has been made to try to obtain core biopsy specimens to facilitate diagnosis and also a core specimen of adjacent liver to evaluate for possible underlying cirrhotic disease.  ULTRASOUND GUIDED CORE BIOPSY OF LIVER  Sedation:  2.0 mg IV Versed;  125 mcg IV Fentanyl  Total Moderate Sedation Time: 25 minutes.  Procedure:  The procedure, risks, benefits, and alternatives were explained to the patient.  Questions regarding the procedure were encouraged and answered.  The patient understands and consents to the procedure.  The abdominal wall was prepped with Betadine in a sterile fashion, and a sterile drape was applied covering the operative field.  A sterile gown and sterile gloves were used for the procedure. Local anesthesia  was provided with 1% Lidocaine.  Sonographic evaluation of the liver was performed under real-time imaging.  A 17 gauge needle was advanced randomly into the right lobe of the liver.  Two intact 18 gauge core biopsy samples were obtained of liver parenchyma and submitted in formalin. Gelfoam pledgets were injected via the outer needle as the needle was retracted from the liver.  Complications: None  Findings: Thorough evaluation of the liver was performed with ultrasound.  The inferior right hepatic lesion seen by both CT and MRI is not reproducibly visible by ultrasound.  As noted on the prior biopsy procedure, there is a tremendous amount of respiratory excursion of the liver which makes it very difficult to obtain an accurate biopsy and would also make CT guided biopsy next to impossible given that the patient cannot reproducibly hold her breath.  Random biopsy was successful in obtaining tissue from the liver for pathologic analysis.  IMPRESSION:  1.  Ultrasound guided random core biopsy of the right lobe was successfully accomplished today to evaluate for underlying cirrhosis/parenchymal liver disease. 2.  The small peripheral lesion visible by CT and MRI is occult by ultrasound and therefore cannot be accurately sampled under ultrasound guidance.  CT guided biopsy would also be quite difficult due to tremendous respiratory excursion of the liver.  I would recommend a follow-up cross-sectional imaging study with either CT or MRI in roughly 2-3 months to evaluate for lesion growth prior to reconsidering biopsy.   Original Report Authenticated By: Irish Lack, M.D.     Scheduled Meds:    . atenolol  100 mg Oral Daily  . cholecalciferol  1,000 Units Oral Daily  . feeding supplement  237 mL Oral BID BM  . pantoprazole  40 mg Oral BID AC  . sucralfate  1 g Oral TID WC & HS   Continuous Infusions:    Principal Problem:  *UGIB (upper gastrointestinal bleed) Active Problems:  Hypertension   Diabetes mellitus  Coronary artery disease  Melena  Cirrhosis  Portal hypertension  Moderate malnutrition in the context of acute illness  Hiatal hernia  Cameron lesion, acute  Thrombocytopenia  Pleural effusion on right  Liver mass     Brendia Sacks, MD  [facility] Team 6 Pager [phone] If 8PM-8AM, please contact night-coverage at [email or web address], password [facility] 09/05/2012, 11:35 AM  LOS: 7 days

## 2012-09-05 NOTE — Progress Notes (Signed)
09/05/12 1320 Pt. to be dc home today.  Requests that her DME be shipped to her home address.  TC to [place], with [facility] to make arrangements for home delievery of DME.  Pt. will have [facility] to provide [facility] PT/OT, and RN.  SOC to begin 24-48hr post discharge. Tera Mater, RN, BSN NCM

## 2012-09-06 LAB — BODY FLUID CULTURE
Culture: NO GROWTH
Special Requests: NORMAL

## 2012-09-08 ENCOUNTER — Ambulatory Visit: Payer: Medicare Other

## 2012-09-23 HISTORY — PX: ENDOSCOPIC VEIN LASER TREATMENT: SHX1508

## 2012-10-06 ENCOUNTER — Other Ambulatory Visit (HOSPITAL_COMMUNITY): Payer: Self-pay | Admitting: Internal Medicine

## 2012-10-06 ENCOUNTER — Ambulatory Visit (HOSPITAL_COMMUNITY)
Admission: RE | Admit: 2012-10-06 | Discharge: 2012-10-06 | Disposition: A | Discharge status: Home / Self Care | Payer: Medicare Other | Source: Ambulatory Visit | Attending: Internal Medicine | Admitting: Internal Medicine

## 2012-10-06 DIAGNOSIS — J90 Pleural effusion, not elsewhere classified: Secondary | ICD-10-CM | POA: Insufficient documentation

## 2012-10-06 DIAGNOSIS — E119 Type 2 diabetes mellitus without complications: Secondary | ICD-10-CM | POA: Insufficient documentation

## 2012-10-06 DIAGNOSIS — I10 Essential (primary) hypertension: Secondary | ICD-10-CM | POA: Insufficient documentation

## 2012-10-06 DIAGNOSIS — Z87891 Personal history of nicotine dependence: Secondary | ICD-10-CM | POA: Insufficient documentation

## 2012-10-06 DIAGNOSIS — J4489 Other specified chronic obstructive pulmonary disease: Secondary | ICD-10-CM | POA: Insufficient documentation

## 2012-10-06 DIAGNOSIS — R05 Cough: Secondary | ICD-10-CM | POA: Insufficient documentation

## 2012-10-06 DIAGNOSIS — J9819 Other pulmonary collapse: Secondary | ICD-10-CM | POA: Insufficient documentation

## 2012-10-06 DIAGNOSIS — K449 Diaphragmatic hernia without obstruction or gangrene: Secondary | ICD-10-CM | POA: Insufficient documentation

## 2012-10-06 DIAGNOSIS — R0602 Shortness of breath: Secondary | ICD-10-CM | POA: Insufficient documentation

## 2012-10-06 DIAGNOSIS — J449 Chronic obstructive pulmonary disease, unspecified: Secondary | ICD-10-CM | POA: Insufficient documentation

## 2012-10-06 DIAGNOSIS — R059 Cough, unspecified: Secondary | ICD-10-CM | POA: Insufficient documentation

## 2012-10-07 ENCOUNTER — Other Ambulatory Visit (HOSPITAL_COMMUNITY): Payer: Self-pay | Admitting: Internal Medicine

## 2012-10-07 ENCOUNTER — Telehealth: Payer: Self-pay

## 2012-10-07 DIAGNOSIS — J90 Pleural effusion, not elsewhere classified: Secondary | ICD-10-CM

## 2012-10-07 NOTE — Telephone Encounter (Signed)
[facility] the office is closed. They will re-open at 8:30 AM [facility]

## 2012-10-08 ENCOUNTER — Ambulatory Visit (HOSPITAL_COMMUNITY)
Admission: RE | Admit: 2012-10-08 | Discharge: 2012-10-08 | Disposition: A | Discharge status: Home / Self Care | Payer: Medicare Other | Source: Ambulatory Visit | Attending: Internal Medicine | Admitting: Internal Medicine

## 2012-10-08 ENCOUNTER — Ambulatory Visit (HOSPITAL_COMMUNITY)
Admission: RE | Admit: 2012-10-08 | Discharge: 2012-10-08 | Disposition: A | Discharge status: Home / Self Care | Payer: Medicare Other | Source: Ambulatory Visit | Attending: Radiology | Admitting: Radiology

## 2012-10-08 DIAGNOSIS — R0602 Shortness of breath: Secondary | ICD-10-CM | POA: Insufficient documentation

## 2012-10-08 DIAGNOSIS — K449 Diaphragmatic hernia without obstruction or gangrene: Secondary | ICD-10-CM | POA: Insufficient documentation

## 2012-10-08 DIAGNOSIS — J90 Pleural effusion, not elsewhere classified: Secondary | ICD-10-CM | POA: Insufficient documentation

## 2012-10-08 NOTE — Telephone Encounter (Signed)
I spoke with [name] and advised that we do not just schedule procedures without seeing the patient here in the office. I advised her to contact radiology to schedule thoracentesis under her docs name. I asked if the pt needs an appt here to follow-up and she states not at this time. Carron Curie, CMA

## 2012-10-08 NOTE — Procedures (Signed)
US imaging finds right pleural effusion not as large as suggested on recent CXR. Successful US guided right thoracentesis. Yielded of clear yellow fluid. Pt tolerated procedure well. No immediate complications.  Specimen was not sent for labs. CXR ordered.  Brayton El PA-C 10/08/2012 3:02 PM

## 2012-10-14 ENCOUNTER — Other Ambulatory Visit: Payer: Self-pay | Admitting: Internal Medicine

## 2012-10-14 DIAGNOSIS — J90 Pleural effusion, not elsewhere classified: Secondary | ICD-10-CM

## 2012-10-15 ENCOUNTER — Ambulatory Visit
Admission: RE | Admit: 2012-10-15 | Discharge: 2012-10-15 | Disposition: A | Discharge status: Home / Self Care | Payer: Medicare Other | Source: Ambulatory Visit | Attending: Internal Medicine | Admitting: Internal Medicine

## 2012-10-15 DIAGNOSIS — J90 Pleural effusion, not elsewhere classified: Secondary | ICD-10-CM

## 2012-10-15 MED ORDER — IOHEXOL 300 MG/ML  SOLN
80.0000 mL | Freq: Once | INTRAMUSCULAR | Status: AC | PRN
  Administered 2012-10-15: 80 mL via INTRAVENOUS

## 2012-10-16 ENCOUNTER — Encounter: Payer: Medicare Other | Admitting: Cardiothoracic Surgery

## 2012-10-20 ENCOUNTER — Institutional Professional Consult (permissible substitution) (INDEPENDENT_AMBULATORY_CARE_PROVIDER_SITE_OTHER): Payer: Medicare Other | Admitting: Cardiothoracic Surgery

## 2012-10-20 VITALS — BP 150/80 | HR 92 | Resp 20 | Ht 66.0 in | Wt 185.0 lb

## 2012-10-20 DIAGNOSIS — J90 Pleural effusion, not elsewhere classified: Secondary | ICD-10-CM

## 2012-10-20 NOTE — Progress Notes (Signed)
PCP is Nadean Corwin, MD Referring Provider is Lucky Cowboy, MD  Chief Complaint  Patient presents with  . Pleural Effusion    Referral from Dr Karma Lew for surgical eval on right pleural effusion     HPI: 79 year old Caucasian female remote smoker with recurrent transudative right pleural effusion requiring thoracentesis x2 in the past 8 weeks. The fluid is cytology negative. The fluid is felt to be secondary to hepatic cirrhosis. The symptoms of the pleural effusion include dry cough shortness of breath and right chest discomfort. The patient is referred for discussion of treatment planned for the recurrent right pleural effusion. Patient with cirrhosis typically do not respond well to talc pleurodesed is and I'll recommend a Pleurx catheter for symptomatic relief of the recurrent pleural effusion.  The patient's last thoracentesis was January 16 - a CT scan of the chest a week later on January 23 shows 0 right pleural effusion. The patient is been taking Bumex 0.5 mg daily.  I would not recommend placement of a Pleurx catheter was patient has a significant recurrent right pleural effusion which would make insertion of the catheter safer as an outpatient procedure. The patient has had GI bleeding from portal hypertension and has probable cryptogenic cirrhosis no history of hepatitis. Her INR is 1.2 and her hematocrit is 35%. Bilirubin is 0.7 protein 6.5 alkaline phosphatase is 128  I reviewed the CT scan of the chest performed January 23 and she is no significant pulmonary pathology. She is a large hiatal hernia with compressive atelectasis of left lower lobe. There is no pericardial effusion noted Patient has history of atherosclerotic disease and had a carotid artery to subclavian artery bypass graft for a right subclavian stenosis   Past Medical History  Diagnosis Date  . Anemia, iron deficiency 08/07/2011  . Angiodysplasia of intestinal tract 08/07/2011  . Diabetes  mellitus   . Hypertension   . Arthritis   . Hx of gastroesophageal reflux (GERD)   . Diverticulitis   . Phlebitis   . Macular degeneration   . TIA (transient ischemic attack)   . MI (myocardial infarction)   . Hiatal hernia   . AVM (arteriovenous malformation) of colon with hemorrhage   . Varicose veins of legs   . Cataract   . GERD (gastroesophageal reflux disease)   . DJD (degenerative joint disease)     Past Surgical History  Procedure Date  . Carotid artery - subclavian artery bypass graft   . Abdominal hysterectomy   . Hemorrhoid surgery   . Esophagogastroduodenoscopy 08/30/2012    Procedure: ESOPHAGOGASTRODUODENOSCOPY (EGD);  Surgeon: [name], MD;  Location: [facility];  Service: Endoscopy;  Laterality: N/A;    Family History  Problem Relation Age of Onset  . Colon cancer Sister   . Coronary artery disease Father     Social History History  Substance Use Topics  . Smoking status: Former Smoker    Quit date: 07/23/1990  . Smokeless tobacco: Never Used  . Alcohol Use: No    Current Outpatient Prescriptions  Medication Sig Dispense Refill  . ALPRAZolam (XANAX) 0.25 MG tablet Take 0.25 mg by mouth at bedtime as needed. ANXIETY.      Marland Kitchen atenolol (TENORMIN) 100 MG tablet Take 100 mg by mouth daily.        . bumetanide (BUMEX) 0.5 MG tablet Take 0.5 mg by mouth continuous as needed. Edema      . cholecalciferol (VITAMIN D) 1000 UNITS tablet Take 1,000 Units by mouth daily.      Marland Kitchen  cyclobenzaprine (FLEXERIL) 10 MG tablet Take 10 mg by mouth 2 (two) times daily as needed. MUSCLE SPASM.      Marland Kitchen NITROSTAT 0.4 MG SL tablet Place 0.4 mg under the tongue.       Marland Kitchen omeprazole (PRILOSEC) 40 MG capsule Take 40 mg by mouth 2 (two) times daily.       Marland Kitchen triamcinolone cream (KENALOG) 0.1 % Apply 1 application topically as needed. Eczema      . [DISCONTINUED] fluvastatin XL (LESCOL XL) 80 MG 24 hr tablet Take 80 mg by mouth daily.          Allergies  Allergen Reactions  .  Moxifloxacin   . Sulfa Antibiotics   . Ciprofloxacin Other (See Comments)    Complains of sore mouth    Review of Systems The patient admits to weight loss. The patient denies fever. Patient denies history of previous malignancy. Patient denies history of cardiac murmur, cardiac catheterization, history pulmonary emboli, or history of significant thoracic trauma. She stopped smoking in 1991.  BP 150/80  Pulse 92  Resp 20  Ht 5\' 6"  (1.676 m)  Wt 185 lb (83.915 kg)  BMI 29.86 kg/m2  SpO2 98% Physical Exam General alert and comfortable accompanied by daughter HEENT normocephalic dentition good Neck without JVD or bruit Thorax breath sounds clear bilaterally no chest deformity or tenderness Cardiac regular and without murmur Abdomen obese nontender nonpalpable liver Extremities varicose veins chronic venous insufficiency layers with 1+ pedal edema Neurologic no focal motor deficit, normal gait  Diagnostic Tests: Chest CT scan chest x-ray is reviewed  Impression: Recurrent right pleural effusion transudate probably related to cirrhosis. She would benefit from a Pleurx catheter for symptomatic relief. I will see the patient back in 10 days with a chest x-ray to see if there is evidence recurrence of fluid and so will probably schedule a Pleurx catheter she agrees. I would not recommend talc pleurodesis with a VATS approach  in this 79 year old patient.  Plan: Followup in 10 days for chest x-ray.

## 2012-10-20 NOTE — Patient Instructions (Signed)
You have had fluid accumulate inside the right chest cavity.site along because of liver fibrosis or cirrhosis. Unit needed needle aspirates of the fluid twice in the past 2 months. The fluid continues to reoccur a Pleurx catheter would be beneficial to help control your symptoms of shortness of breath and cough caused by the fluid The Pleurx catheter would not treat the underlying cause of the fluid accumulation which is do to a liver abnormality but would help improve the way you feel

## 2012-10-28 ENCOUNTER — Encounter: Payer: Self-pay | Admitting: Cardiothoracic Surgery

## 2012-10-28 ENCOUNTER — Ambulatory Visit
Admission: RE | Admit: 2012-10-28 | Discharge: 2012-10-28 | Disposition: A | Discharge status: Home / Self Care | Payer: Medicare Other | Source: Ambulatory Visit | Attending: Cardiothoracic Surgery | Admitting: Cardiothoracic Surgery

## 2012-10-28 ENCOUNTER — Ambulatory Visit (INDEPENDENT_AMBULATORY_CARE_PROVIDER_SITE_OTHER): Payer: Medicare Other | Admitting: Cardiothoracic Surgery

## 2012-10-28 ENCOUNTER — Other Ambulatory Visit: Payer: Self-pay | Admitting: *Deleted

## 2012-10-28 VITALS — BP 145/83 | HR 90 | Resp 16 | Ht 66.0 in | Wt 185.0 lb

## 2012-10-28 DIAGNOSIS — J90 Pleural effusion, not elsewhere classified: Secondary | ICD-10-CM

## 2012-10-28 DIAGNOSIS — I2510 Atherosclerotic heart disease of native coronary artery without angina pectoris: Secondary | ICD-10-CM

## 2012-10-28 NOTE — Progress Notes (Signed)
PCP is Nadean Corwin, MD Referring Provider is Lucky Cowboy, MD  Chief Complaint  Patient presents with  . Pleural Effusion    1 week f/u with CXR, discuss surgery     HPI: 79 year old female with recurrent right pleural effusion related to cryptogenic cirrhosis of the liver. She is seen 2 weeks ago for recurrent pleural effusion management he returns today with a chest x-ray as her last CT scan of the chest showed no right pleural effusion. She is now 6 weeks following her last thoracentesis and her chest x-ray today shows no right pleural effusion. Her liver disease is being managed by Dr. Darlen Round and she is taking Bumex daily. Her improved diet and daily Bumex is probably helping prevent recurrent right pleural effusion. We discussed right Pleurx catheter placement at the effusion recurs at this point there is no evidence of significant effusion and I would not recommend Pleurx catheter placement until she develops a significant effusion which is symptomatic.   Past Medical History  Diagnosis Date  . Anemia, iron deficiency 08/07/2011  . Angiodysplasia of intestinal tract 08/07/2011  . Diabetes mellitus   . Hypertension   . Arthritis   . Hx of gastroesophageal reflux (GERD)   . Diverticulitis   . Phlebitis   . Macular degeneration   . TIA (transient ischemic attack)   . MI (myocardial infarction)   . Hiatal hernia   . AVM (arteriovenous malformation) of colon with hemorrhage   . Varicose veins of legs   . Cataract   . GERD (gastroesophageal reflux disease)   . DJD (degenerative joint disease)     Past Surgical History  Procedure Date  . Carotid artery - subclavian artery bypass graft   . Abdominal hysterectomy   . Hemorrhoid surgery   . Esophagogastroduodenoscopy 08/30/2012    Procedure: ESOPHAGOGASTRODUODENOSCOPY (EGD);  Surgeon: [name], MD;  Location: [facility];  Service: Endoscopy;  Laterality: N/A;    Family History  Problem Relation Age of Onset   . Colon cancer Sister   . Coronary artery disease Father     Social History History  Substance Use Topics  . Smoking status: Former Smoker    Quit date: 07/23/1990  . Smokeless tobacco: Never Used  . Alcohol Use: No    Current Outpatient Prescriptions  Medication Sig Dispense Refill  . ALPRAZolam (XANAX) 0.25 MG tablet Take 0.25 mg by mouth at bedtime as needed. ANXIETY.      Marland Kitchen atenolol (TENORMIN) 100 MG tablet Take 100 mg by mouth daily.        . bumetanide (BUMEX) 0.5 MG tablet Take 0.5 mg by mouth continuous as needed. Edema      . cholecalciferol (VITAMIN D) 1000 UNITS tablet Take 1,000 Units by mouth daily.      . cyclobenzaprine (FLEXERIL) 10 MG tablet Take 10 mg by mouth 2 (two) times daily as needed. MUSCLE SPASM.      Marland Kitchen NITROSTAT 0.4 MG SL tablet Place 0.4 mg under the tongue.       Marland Kitchen omeprazole (PRILOSEC) 40 MG capsule Take 40 mg by mouth 2 (two) times daily.       Marland Kitchen triamcinolone cream (KENALOG) 0.1 % Apply 1 application topically as needed. Eczema      . [DISCONTINUED] fluvastatin XL (LESCOL XL) 80 MG 24 hr tablet Take 80 mg by mouth daily.          Allergies  Allergen Reactions  . Moxifloxacin   . Sulfa Antibiotics   .  Ciprofloxacin Other (See Comments)    Complains of sore mouth    Review of Systems no cough breathing comfortably  BP 145/83  Pulse 90  Resp 16  Ht 5\' 6"  (1.676 m)  Wt 185 lb (83.915 kg)  BMI 29.86 kg/m2  SpO2 98% Physical Exam Alert and oriented Breath sounds clear bilaterally Cardiac rhythm regular Peripheral venous insufficiency m is with minimal edema Diagnostic Tests:  Chest x-ray reviewed today showing no significant right pleural effusion Impression: Would not recommend right Pleurx catheter placement until effusion recurs and is symptomatic. Return for discussion of surgery at that time.  Plan:

## 2012-11-07 ENCOUNTER — Other Ambulatory Visit: Payer: Self-pay

## 2012-11-25 ENCOUNTER — Other Ambulatory Visit: Payer: Self-pay | Admitting: Gastroenterology

## 2012-11-25 DIAGNOSIS — R634 Abnormal weight loss: Secondary | ICD-10-CM

## 2012-11-25 DIAGNOSIS — R932 Abnormal findings on diagnostic imaging of liver and biliary tract: Secondary | ICD-10-CM

## 2012-11-25 DIAGNOSIS — R109 Unspecified abdominal pain: Secondary | ICD-10-CM

## 2012-11-27 ENCOUNTER — Inpatient Hospital Stay: Admission: RE | Admit: 2012-11-27 | Payer: Medicare Other | Source: Ambulatory Visit

## 2012-11-30 ENCOUNTER — Ambulatory Visit
Admission: RE | Admit: 2012-11-30 | Discharge: 2012-11-30 | Disposition: A | Discharge status: Home / Self Care | Payer: Medicare Other | Source: Ambulatory Visit | Attending: Gastroenterology | Admitting: Gastroenterology

## 2012-11-30 ENCOUNTER — Other Ambulatory Visit: Payer: Self-pay | Admitting: Gastroenterology

## 2012-11-30 DIAGNOSIS — R932 Abnormal findings on diagnostic imaging of liver and biliary tract: Secondary | ICD-10-CM

## 2012-11-30 DIAGNOSIS — R069 Unspecified abnormalities of breathing: Secondary | ICD-10-CM

## 2012-11-30 DIAGNOSIS — R109 Unspecified abdominal pain: Secondary | ICD-10-CM

## 2012-11-30 DIAGNOSIS — R634 Abnormal weight loss: Secondary | ICD-10-CM

## 2012-11-30 MED ORDER — IOHEXOL 300 MG/ML  SOLN
100.0000 mL | Freq: Once | INTRAMUSCULAR | Status: AC | PRN
  Administered 2012-11-30: 100 mL via INTRAVENOUS

## 2012-12-01 ENCOUNTER — Other Ambulatory Visit (HOSPITAL_COMMUNITY): Payer: Self-pay | Admitting: Gastroenterology

## 2012-12-01 DIAGNOSIS — R160 Hepatomegaly, not elsewhere classified: Secondary | ICD-10-CM

## 2012-12-03 ENCOUNTER — Other Ambulatory Visit: Payer: Self-pay | Admitting: Radiology

## 2012-12-07 ENCOUNTER — Encounter (HOSPITAL_COMMUNITY): Payer: Self-pay | Admitting: Pharmacy Technician

## 2012-12-09 ENCOUNTER — Ambulatory Visit (HOSPITAL_COMMUNITY)
Admission: RE | Admit: 2012-12-09 | Discharge: 2012-12-09 | Disposition: A | Discharge status: Home / Self Care | Payer: Medicare Other | Source: Ambulatory Visit | Attending: Gastroenterology | Admitting: Gastroenterology

## 2012-12-09 ENCOUNTER — Encounter (HOSPITAL_COMMUNITY): Payer: Self-pay

## 2012-12-09 DIAGNOSIS — R160 Hepatomegaly, not elsewhere classified: Secondary | ICD-10-CM

## 2012-12-09 DIAGNOSIS — K219 Gastro-esophageal reflux disease without esophagitis: Secondary | ICD-10-CM | POA: Insufficient documentation

## 2012-12-09 DIAGNOSIS — C228 Malignant neoplasm of liver, primary, unspecified as to type: Secondary | ICD-10-CM | POA: Insufficient documentation

## 2012-12-09 DIAGNOSIS — I252 Old myocardial infarction: Secondary | ICD-10-CM | POA: Insufficient documentation

## 2012-12-09 DIAGNOSIS — Z79899 Other long term (current) drug therapy: Secondary | ICD-10-CM | POA: Insufficient documentation

## 2012-12-09 DIAGNOSIS — Z87891 Personal history of nicotine dependence: Secondary | ICD-10-CM | POA: Insufficient documentation

## 2012-12-09 DIAGNOSIS — Z8673 Personal history of transient ischemic attack (TIA), and cerebral infarction without residual deficits: Secondary | ICD-10-CM | POA: Insufficient documentation

## 2012-12-09 DIAGNOSIS — I10 Essential (primary) hypertension: Secondary | ICD-10-CM | POA: Insufficient documentation

## 2012-12-09 DIAGNOSIS — E119 Type 2 diabetes mellitus without complications: Secondary | ICD-10-CM | POA: Insufficient documentation

## 2012-12-09 LAB — CBC
Hemoglobin: 11.7 g/dL — ABNORMAL LOW (ref 12.0–15.0)
MCH: 26.2 pg (ref 26.0–34.0)
Platelets: 189 10*3/uL (ref 150–400)
RBC: 4.46 MIL/uL (ref 3.87–5.11)
WBC: 6.3 10*3/uL (ref 4.0–10.5)

## 2012-12-09 LAB — APTT: aPTT: 34 seconds (ref 24–37)

## 2012-12-09 LAB — PROTIME-INR
INR: 1.11 (ref 0.00–1.49)
Prothrombin Time: 14.2 seconds (ref 11.6–15.2)

## 2012-12-09 MED ORDER — FENTANYL CITRATE 0.05 MG/ML IJ SOLN
INTRAMUSCULAR | Status: AC
  Filled 2012-12-09: qty 6

## 2012-12-09 MED ORDER — SODIUM CHLORIDE 0.9 % IV SOLN: INTRAVENOUS | Status: DC

## 2012-12-09 MED ORDER — FENTANYL CITRATE 0.05 MG/ML IJ SOLN
INTRAMUSCULAR | Status: AC | PRN
  Administered 2012-12-09 (×2): 50 ug via INTRAVENOUS

## 2012-12-09 MED ORDER — MIDAZOLAM HCL 2 MG/2ML IJ SOLN
INTRAMUSCULAR | Status: AC
  Filled 2012-12-09: qty 6

## 2012-12-09 MED ORDER — MIDAZOLAM HCL 2 MG/2ML IJ SOLN
INTRAMUSCULAR | Status: AC | PRN
  Administered 2012-12-09 (×2): 1 mg via INTRAVENOUS

## 2012-12-09 NOTE — H&P (Signed)
[name] is an 79 y.o. female.   Chief Complaint: "I'm here for another liver biopsy" HPI: Patient with history of inferior right hepatic lobe lesion with FNA in 08/2012 revealing few atypical cells. She is also s/p random core biopsy of right lobe liver in 08/2012 which revealed steatohepatitis. She presents today for attempted repeat biopsy of the inferior right hepatic lobe lesion .  Past Medical History  Diagnosis Date  . Anemia, iron deficiency 08/07/2011  . Angiodysplasia of intestinal tract 08/07/2011  . Diabetes mellitus   . Hypertension   . Arthritis   . Hx of gastroesophageal reflux (GERD)   . Diverticulitis   . Phlebitis   . Macular degeneration   . TIA (transient ischemic attack)   . MI (myocardial infarction)   . Hiatal hernia   . AVM (arteriovenous malformation) of colon with hemorrhage   . Varicose veins of legs   . Cataract   . GERD (gastroesophageal reflux disease)   . DJD (degenerative joint disease)     Past Surgical History  Procedure Laterality Date  . 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 Abdominal hysterectomy    . Hemorrhoid surgery    . Esophagogastroduodenoscopy  08/30/2012    Procedure: ESOPHAGOGASTRODUODENOSCOPY (EGD);  Surgeon: [name], MD;  Location: [facility];  Service: Endoscopy;  Laterality: N/A;    Family History  Problem Relation Age of Onset  . Colon cancer Sister   . Coronary artery disease Father    Social History:  reports that she quit smoking about 22 years ago. She has never used smokeless tobacco. She reports that she does not drink alcohol or use illicit drugs.  Allergies:  Allergies  Allergen Reactions  . Moxifloxacin   . Sulfa Antibiotics   . Ciprofloxacin Other (See Comments)    Complains of sore mouth     Current outpatient prescriptions:ALPRAZolam (XANAX) 1 MG tablet, Take 0.5 mg by mouth at bedtime as needed for sleep or anxiety., Disp: , Rfl: ;  atenolol (TENORMIN) 100 MG tablet, Take 100  mg by mouth daily before breakfast. , Disp: , Rfl: ;  bumetanide (BUMEX) 0.5 MG tablet, Take 0.5 mg by mouth continuous as needed. Edema, Disp: , Rfl: ;  cholecalciferol (VITAMIN D) 1000 UNITS tablet, Take 1,000 Units by mouth daily., Disp: , Rfl:  cyclobenzaprine (FLEXERIL) 10 MG tablet, Take 10 mg by mouth 2 (two) times daily as needed. MUSCLE SPASM., Disp: , Rfl: ;  omeprazole (PRILOSEC) 40 MG capsule, Take 40 mg by mouth 2 (two) times daily. , Disp: , Rfl: ;  triamcinolone cream (KENALOG) 0.1 %, Apply 1 application topically as needed. Eczema, Disp: , Rfl: ;  NITROSTAT 0.4 MG SL tablet, Place 0.4 mg under the tongue. , Disp: , Rfl:  [DISCONTINUED] fluvastatin XL (LESCOL XL) 80 MG 24 hr tablet, Take 80 mg by mouth daily.  , Disp: , Rfl:  Current facility-administered medications:0.9 %  sodium chloride infusion, , Intravenous, Continuous, D Jeananne [name], PA-C  Results for orders placed during the hospital encounter of 12/09/12 (from the past 48 hour(s))  APTT     Status: None   Collection Time    12/09/12 11:10 AM      Result Value Range   aPTT 34  24 - 37 seconds  CBC     Status: Abnormal   Collection Time    12/09/12 11:10 AM      Result Value Range   WBC 6.3  4.0 -  10.5 K/uL   RBC 4.46  3.87 - 5.11 MIL/uL   Hemoglobin 11.7 (*) 12.0 - 15.0 g/dL   HCT 45.4  09.8 - 11.9 %   MCV 82.1  78.0 - 100.0 fL   MCH 26.2  26.0 - 34.0 pg   MCHC 32.0  30.0 - 36.0 g/dL   RDW 14.7 (*) 82.9 - 56.2 %   Platelets 189  150 - 400 K/uL  PROTIME-INR     Status: None   Collection Time    12/09/12 11:10 AM      Result Value Range   Prothrombin Time 14.2  11.6 - 15.2 seconds   INR 1.11  0.00 - 1.49   No results found.  Review of Systems  Constitutional: Negative for fever and chills.  Respiratory:       Occ cough, dyspnea with exertion  Cardiovascular: Negative for chest pain.  Gastrointestinal: Negative for nausea, vomiting and abdominal pain.  Musculoskeletal: Negative for back pain.   Neurological: Negative for headaches.  Endo/Heme/Allergies: Does not bruise/bleed easily.    Blood pressure 161/68, pulse 93, temperature 97.1 F (36.2 C), resp. rate 20, height 5\' 6"  (1.676 m), weight 186 lb (84.369 kg), SpO2 98.00%. Physical Exam  Constitutional: She is oriented to person, place, and time. She appears well-developed and well-nourished.  Cardiovascular: Normal rate and regular rhythm.   Respiratory: Effort normal and breath sounds normal.  GI: Soft. Bowel sounds are normal. There is no tenderness.  Musculoskeletal: Normal range of motion. She exhibits no edema.  Neurological: She is alert and oriented to person, place, and time.     Assessment/Plan Pt with inferior right hepatic lobe lesion with atypical cells noted on FNA of site from 08/2012. Recent CT reveals persistence of lesion and plan is for repeat US guided biopsy of the inferior right hepatic lesion today. Details/risks of procedure d/w pt/son with their understanding and consent.  Hayato Guaman,D KEVIN 12/09/2012, 11:42 AM

## 2012-12-09 NOTE — Procedures (Signed)
18 g core times three No comp

## 2012-12-14 ENCOUNTER — Telehealth: Payer: Self-pay | Admitting: Hematology & Oncology

## 2012-12-14 ENCOUNTER — Other Ambulatory Visit (HOSPITAL_COMMUNITY): Payer: Self-pay | Admitting: Gastroenterology

## 2012-12-14 DIAGNOSIS — C229 Malignant neoplasm of liver, not specified as primary or secondary: Secondary | ICD-10-CM

## 2012-12-14 NOTE — Telephone Encounter (Signed)
[name] from Dr. Ewing Schlein called pt needs to be seen ASAP for liver ca. They are aware we will call pt to schedule Dr. Myna Hidalgo is aware and will give me date and time for patient after he reveiws his schedule

## 2012-12-16 ENCOUNTER — Telehealth: Payer: Self-pay | Admitting: Hematology & Oncology

## 2012-12-16 ENCOUNTER — Other Ambulatory Visit: Payer: Self-pay | Admitting: *Deleted

## 2012-12-16 DIAGNOSIS — D509 Iron deficiency anemia, unspecified: Secondary | ICD-10-CM

## 2012-12-16 DIAGNOSIS — C220 Liver cell carcinoma: Secondary | ICD-10-CM

## 2012-12-16 NOTE — Telephone Encounter (Signed)
Pt aware of 3-28

## 2012-12-17 ENCOUNTER — Encounter (HOSPITAL_COMMUNITY): Payer: Self-pay

## 2012-12-17 ENCOUNTER — Ambulatory Visit (HOSPITAL_COMMUNITY)
Admission: RE | Admit: 2012-12-17 | Discharge: 2012-12-17 | Disposition: A | Discharge status: Home / Self Care | Payer: Medicare Other | Source: Ambulatory Visit | Attending: Gastroenterology | Admitting: Gastroenterology

## 2012-12-17 DIAGNOSIS — C229 Malignant neoplasm of liver, not specified as primary or secondary: Secondary | ICD-10-CM

## 2012-12-17 DIAGNOSIS — K8020 Calculus of gallbladder without cholecystitis without obstruction: Secondary | ICD-10-CM | POA: Insufficient documentation

## 2012-12-17 DIAGNOSIS — D509 Iron deficiency anemia, unspecified: Secondary | ICD-10-CM | POA: Insufficient documentation

## 2012-12-17 DIAGNOSIS — C228 Malignant neoplasm of liver, primary, unspecified as to type: Secondary | ICD-10-CM | POA: Insufficient documentation

## 2012-12-17 LAB — GLUCOSE, CAPILLARY: Glucose-Capillary: 98 mg/dL (ref 70–99)

## 2012-12-17 MED ORDER — FLUDEOXYGLUCOSE F - 18 (FDG) INJECTION
16.2000 | Freq: Once | INTRAVENOUS | Status: AC | PRN
  Administered 2012-12-17: 16.2 via INTRAVENOUS

## 2012-12-18 ENCOUNTER — Ambulatory Visit (HOSPITAL_BASED_OUTPATIENT_CLINIC_OR_DEPARTMENT_OTHER): Payer: Medicare Other | Admitting: Hematology & Oncology

## 2012-12-18 ENCOUNTER — Ambulatory Visit (HOSPITAL_BASED_OUTPATIENT_CLINIC_OR_DEPARTMENT_OTHER): Payer: Medicare Other

## 2012-12-18 ENCOUNTER — Other Ambulatory Visit (HOSPITAL_BASED_OUTPATIENT_CLINIC_OR_DEPARTMENT_OTHER): Payer: Medicare Other | Admitting: Lab

## 2012-12-18 VITALS — BP 130/50 | HR 75 | Temp 98.8°F | Resp 16 | Ht 66.0 in | Wt 190.0 lb

## 2012-12-18 DIAGNOSIS — C220 Liver cell carcinoma: Secondary | ICD-10-CM

## 2012-12-18 DIAGNOSIS — D509 Iron deficiency anemia, unspecified: Secondary | ICD-10-CM

## 2012-12-18 DIAGNOSIS — C228 Malignant neoplasm of liver, primary, unspecified as to type: Secondary | ICD-10-CM

## 2012-12-18 LAB — CBC WITH DIFFERENTIAL (CANCER CENTER ONLY)
BASO%: 0.3 % (ref 0.0–2.0)
EOS%: 2.7 % (ref 0.0–7.0)
HCT: 35 % (ref 34.8–46.6)
LYMPH#: 2 10*3/uL (ref 0.9–3.3)
LYMPH%: 34.4 % (ref 14.0–48.0)
MCHC: 32 g/dL (ref 32.0–36.0)
MCV: 83 fL (ref 81–101)
NEUT%: 47.8 % (ref 39.6–80.0)
Platelets: 170 10*3/uL (ref 145–400)
RDW: 17.4 % — ABNORMAL HIGH (ref 11.1–15.7)
WBC: 5.9 10*3/uL (ref 3.9–10.0)

## 2012-12-18 LAB — COMPREHENSIVE METABOLIC PANEL
ALT: 27 U/L (ref 0–35)
AST: 42 U/L — ABNORMAL HIGH (ref 0–37)
Creatinine, Ser: 0.9 mg/dL (ref 0.50–1.10)
Sodium: 139 mEq/L (ref 135–145)
Total Bilirubin: 0.6 mg/dL (ref 0.3–1.2)
Total Protein: 6.5 g/dL (ref 6.0–8.3)

## 2012-12-18 MED ORDER — SODIUM CHLORIDE 0.9 % IV SOLN
Freq: Once | INTRAVENOUS | Status: AC
  Administered 2012-12-18: 13:00:00 via INTRAVENOUS

## 2012-12-18 MED ORDER — SODIUM CHLORIDE 0.9 % IV SOLN
1020.0000 mg | Freq: Once | INTRAVENOUS | Status: AC
  Administered 2012-12-18: 1020 mg via INTRAVENOUS
  Filled 2012-12-18: qty 34

## 2012-12-18 NOTE — Patient Instructions (Signed)
Ferumoxytol injection What is this medicine? FERUMOXYTOL is an iron complex. Iron is used to make healthy red blood cells, which carry oxygen and nutrients throughout the body. This medicine is used to treat iron deficiency anemia in people with chronic kidney disease. This medicine may be used for other purposes; ask your health care provider or pharmacist if you have questions. What should I tell my health care provider before I take this medicine? They need to know if you have any of these conditions: -anemia not caused by low iron levels -high levels of iron in the blood -magnetic resonance imaging (MRI) test scheduled -an unusual or allergic reaction to iron, other medicines, foods, dyes, or preservatives -pregnant or trying to get pregnant -breast-feeding How should I use this medicine? This medicine is for infusion into a vein. It is given by a health care professional in a hospital or clinic setting. Talk to your pediatrician regarding the use of this medicine in children. Special care may be needed. Overdosage: If you think you've taken too much of this medicine contact a poison control center or emergency room at once. Overdosage: If you think you have taken too much of this medicine contact a poison control center or emergency room at once. NOTE: This medicine is only for you. Do not share this medicine with others. What if I miss a dose? It is important not to miss your dose. Call your doctor or health care professional if you are unable to keep an appointment. What may interact with this medicine? This medicine may interact with the following medications: -other iron products This list may not describe all possible interactions. Give your health care provider a list of all the medicines, herbs, non-prescription drugs, or dietary supplements you use. Also tell them if you smoke, drink alcohol, or use illegal drugs. Some items may interact with your medicine. What should I watch  for while using this medicine? Visit your doctor or healthcare professional regularly. Tell your doctor or healthcare professional if your symptoms do not start to get better or if they get worse. You may need blood work done while you are taking this medicine. You may need to follow a special diet. Talk to your doctor. Foods that contain iron include: whole grains/cereals, dried fruits, beans, or peas, leafy green vegetables, and organ meats (liver, kidney). What side effects may I notice from receiving this medicine? Side effects that you should report to your doctor or health care professional as soon as possible: -allergic reactions like skin rash, itching or hives, swelling of the face, lips, or tongue -breathing problems -changes in blood pressure -feeling faint or lightheaded, falls -fever or chills -flushing, sweating, or hot feelings -swelling of the ankles or feet Side effects that usually do not require medical attention (Report these to your doctor or health care professional if they continue or are bothersome.): -diarrhea -headache -nausea, vomiting -stomach pain This list may not describe all possible side effects. Call your doctor for medical advice about side effects. You may report side effects to FDA at 1-800-FDA-1088. Where should I keep my medicine? This drug is given in a hospital or clinic and will not be stored at home. NOTE: This sheet is a summary. It may not cover all possible information. If you have questions about this medicine, talk to your doctor, pharmacist, or health care provider.  2013, Elsevier/Gold Standard. (06/01/2008 9:48:25 PM)  

## 2012-12-18 NOTE — Progress Notes (Signed)
This office note has been dictated.

## 2012-12-21 ENCOUNTER — Other Ambulatory Visit: Payer: Self-pay | Admitting: Hematology & Oncology

## 2012-12-21 DIAGNOSIS — C220 Liver cell carcinoma: Secondary | ICD-10-CM

## 2012-12-21 NOTE — Progress Notes (Signed)
CC:   Lucky Cowboy, M.D. [name], M.D.  DIAGNOSES: 1. Localized hepatocellular carcinoma. 2. NASH (nonalcoholic steatohepatitis). 3. History of recurrent iron deficiency anemia.  CURRENT THERAPY: 1. IV iron as indicated, the patient to receive a dose today. 2. The patient to be set up for RFA intervention.  INTERIM HISTORY:  [name] comes in for followup.  We last saw her back in November.  Unfortunately, she now has been diagnosed with hepatocellular carcinoma.  She was admitted I think with GI bleeding.  This was I think back in December.  At that point in time she had a CT scan.  The CT scan showed a lesion in the liver that measured I think 1.9 x 1.4 cm.  This was in the right hepatic lobe.  She had an MRI of the liver.  This was done in December.  MRI confirmed a 1.7 x 1.4 cm lesion in the right hepatic lobe.  She has had a repeat CT scan.  This was done back on March 10.  The repeat CT scan showed the 1.7 cm lesion in the right lobe of the liver. There was some porta hepatis and retroperitoneal adenopathy which had been noted previously.  She did undergo a PET scan.  The PET scan was done on 03/27.  The PET scan actually did not show any obvious activity within the liver or outside of the liver.  We are seeing her today.  She still is feeling a little bit tired.  She probably has not had iron from [place] for quite a while.  I think it has been probably over a year since she had any iron from [place].  I suspect that she may have had some very slow bleeding.  She sees Dr. Ewing Schlein for her liver.  Her appetite is down a little bit.  She does not have any kind of pain.  PHYSICAL EXAMINATION:  General:  This is an elderly but well-nourished white female in no obvious distress.  Vital signs:  Show temperature of 97.8, pulse 75, respiratory rate 16, blood pressure 130/50.  Weight is 190.  Head and neck:  Shows a normocephalic, atraumatic skull.  There are no ocular or  oral lesions.  There are no palpable cervical or supraclavicular lymph nodes.  Lungs:  Clear to percussion and auscultation bilaterally.  Cardiac:  Regular rate and rhythm with a normal S1 and S2.  There are no murmurs, rubs or bruits.  Abdomen:  Soft with good bowel sounds.  There is no palpable abdominal mass.  There is no fluid wave.  There is no palpable hepatosplenomegaly.  Extremities: Show some trace edema in her legs.  Neurological:  Shows no focal neurological deficits.  LABORATORY STUDIES:  White cell count is 5.9, hemoglobin 11.2, hematocrit 35, platelet count 170.  MCV is 83.  IMPRESSION:  Ms. [name] is an 79 year old white female with history of recurrent iron deficiency anemia.  She now has hepatocellular carcinoma.  I forgot to mention that she did have a biopsy done.  This was done on 12/09/2012.  The pathology report ([id number]) showed hepatocellular carcinoma.  I have sent off an alpha fetoprotein level on her today.  I am sure she has had one done already.  Her last alpha fetoprotein level I saw on her back in the summer was 5.9.  I talked to her with her daughter.  I talked to her at length about this situation.  I am surprised that she does have this considering  that she does not have hepatitis or hemochromatosis.  I talked to her about radiofrequency ablation therapy for this.  I believe this would be a very reasonable way to go to try to help her out.  I will call Radiology and see what we can do to get her set up to be seen by them.  We will go ahead and give her iron today.  I suspect that she is iron deficient from her last GI bleeding episode.  We will go ahead and plan to get her back to see [place] probably in about 6 weeks or so.  I would think by then, that she would have had her procedure done.  I think that the only way we are going to be able to follow her is with CT or MRI.  We probably will not need to do another one for about 3 months or  so.    ______________________________ Josph Macho, M.D. PRE/MEDQ  D:  12/18/2012  T:  12/18/2012  Job:  [id number]

## 2012-12-24 ENCOUNTER — Ambulatory Visit
Admission: RE | Admit: 2012-12-24 | Discharge: 2012-12-24 | Disposition: A | Discharge status: Home / Self Care | Payer: Medicare Other | Source: Ambulatory Visit | Attending: Hematology & Oncology | Admitting: Hematology & Oncology

## 2012-12-24 ENCOUNTER — Other Ambulatory Visit (HOSPITAL_COMMUNITY): Payer: Self-pay | Admitting: Interventional Radiology

## 2012-12-24 DIAGNOSIS — C220 Liver cell carcinoma: Secondary | ICD-10-CM

## 2013-01-04 ENCOUNTER — Ambulatory Visit
Admission: RE | Admit: 2013-01-04 | Discharge: 2013-01-04 | Disposition: A | Discharge status: Home / Self Care | Payer: Medicare Other | Source: Ambulatory Visit | Attending: Interventional Radiology | Admitting: Interventional Radiology

## 2013-01-04 DIAGNOSIS — C220 Liver cell carcinoma: Secondary | ICD-10-CM

## 2013-01-04 MED ORDER — GADOBENATE DIMEGLUMINE 529 MG/ML IV SOLN
17.0000 mL | Freq: Once | INTRAVENOUS | Status: AC | PRN
  Administered 2013-01-04: 17 mL via INTRAVENOUS

## 2013-01-12 ENCOUNTER — Other Ambulatory Visit (HOSPITAL_COMMUNITY): Payer: Self-pay | Admitting: Interventional Radiology

## 2013-01-12 DIAGNOSIS — C220 Liver cell carcinoma: Secondary | ICD-10-CM

## 2013-01-19 ENCOUNTER — Ambulatory Visit
Admission: RE | Admit: 2013-01-19 | Discharge: 2013-01-19 | Disposition: A | Discharge status: Home / Self Care | Payer: Medicare Other | Source: Ambulatory Visit | Attending: Interventional Radiology | Admitting: Interventional Radiology

## 2013-01-19 ENCOUNTER — Other Ambulatory Visit: Payer: Self-pay | Admitting: Internal Medicine

## 2013-01-19 ENCOUNTER — Ambulatory Visit
Admission: RE | Admit: 2013-01-19 | Discharge: 2013-01-19 | Disposition: A | Discharge status: Home / Self Care | Payer: Medicare Other | Source: Ambulatory Visit | Attending: Internal Medicine | Admitting: Internal Medicine

## 2013-01-19 DIAGNOSIS — J90 Pleural effusion, not elsewhere classified: Secondary | ICD-10-CM

## 2013-01-19 DIAGNOSIS — C220 Liver cell carcinoma: Secondary | ICD-10-CM

## 2013-01-21 HISTORY — PX: RADIOFREQUENCY ABLATION LIVER TUMOR: SHX2293

## 2013-01-25 ENCOUNTER — Ambulatory Visit (HOSPITAL_BASED_OUTPATIENT_CLINIC_OR_DEPARTMENT_OTHER): Payer: Medicare Other | Admitting: Hematology & Oncology

## 2013-01-25 ENCOUNTER — Other Ambulatory Visit (HOSPITAL_BASED_OUTPATIENT_CLINIC_OR_DEPARTMENT_OTHER): Payer: Medicare Other | Admitting: Lab

## 2013-01-25 ENCOUNTER — Telehealth: Payer: Self-pay | Admitting: Hematology & Oncology

## 2013-01-25 VITALS — BP 132/67 | HR 79 | Temp 98.1°F | Resp 16 | Ht 66.0 in | Wt 188.0 lb

## 2013-01-25 DIAGNOSIS — C228 Malignant neoplasm of liver, primary, unspecified as to type: Secondary | ICD-10-CM

## 2013-01-25 DIAGNOSIS — D509 Iron deficiency anemia, unspecified: Secondary | ICD-10-CM

## 2013-01-25 DIAGNOSIS — C220 Liver cell carcinoma: Secondary | ICD-10-CM

## 2013-01-25 DIAGNOSIS — K7689 Other specified diseases of liver: Secondary | ICD-10-CM

## 2013-01-25 LAB — IRON AND TIBC
%SAT: 32 % (ref 20–55)
TIBC: 283 ug/dL (ref 250–470)
UIBC: 193 ug/dL (ref 125–400)

## 2013-01-25 LAB — COMPREHENSIVE METABOLIC PANEL
Albumin: 3.7 g/dL (ref 3.5–5.2)
Alkaline Phosphatase: 119 U/L — ABNORMAL HIGH (ref 39–117)
BUN: 14 mg/dL (ref 6–23)
Calcium: 10.1 mg/dL (ref 8.4–10.5)
Chloride: 106 mEq/L (ref 96–112)
Glucose, Bld: 111 mg/dL — ABNORMAL HIGH (ref 70–99)
Potassium: 4.2 mEq/L (ref 3.5–5.3)
Sodium: 136 mEq/L (ref 135–145)
Total Protein: 6.9 g/dL (ref 6.0–8.3)

## 2013-01-25 LAB — CBC WITH DIFFERENTIAL (CANCER CENTER ONLY)
BASO#: 0 10*3/uL (ref 0.0–0.2)
Eosinophils Absolute: 0.3 10*3/uL (ref 0.0–0.5)
HGB: 13.3 g/dL (ref 11.6–15.9)
LYMPH#: 1.9 10*3/uL (ref 0.9–3.3)
MONO#: 0.9 10*3/uL (ref 0.1–0.9)
MONO%: 14.7 % — ABNORMAL HIGH (ref 0.0–13.0)
NEUT#: 3.2 10*3/uL (ref 1.5–6.5)
Platelets: 136 10*3/uL — ABNORMAL LOW (ref 145–400)
RBC: 4.57 10*6/uL (ref 3.70–5.32)
WBC: 6.3 10*3/uL (ref 3.9–10.0)

## 2013-01-25 NOTE — Telephone Encounter (Signed)
Pt will call to schedule follow-up for 2-3 months depending when her surgery is.

## 2013-01-25 NOTE — Progress Notes (Signed)
This office note has been dictated.

## 2013-01-26 ENCOUNTER — Telehealth: Payer: Self-pay

## 2013-01-26 NOTE — Progress Notes (Signed)
CC:   Lucky Cowboy, M.D. Khylee Algeo M. [place], M.D. [name], M.D.  DIAGNOSES: 1. Localized hepatocellular carcinoma. 2. History of recurrent iron-deficiency anemia. 3. Nonalcoholic steatohepatitis.  CURRENT THERAPY: 1. Patient to be considered for RFA therapy. 2. IV iron as indicated.  INTERIM HISTORY:  [name] comes in for followup.  She did see Dr. Grace Isaac of Interventional Radiology.  He did do a repeat MRI.  This did show the tumor in the right hepatic lobe had grown a little bit.  It was 1.7 x 2.2 cm.  No other areas of enhancement were noted that would suggest a spread.  There was no obvious extrahepatic disease.  Her alpha-fetoprotein was 4.7.  She is to see Dr. [place] of cardiology for cardiology clearance.  We did go ahead and give her iron when she was here last.  This made her feel better.  She has had no melena or bright red blood per rectum.  PHYSICAL EXAMINATION:  General:  This is an elderly but well-nourished white female in no obvious distress.  Vital signs:  Temperature of 98.1, pulse 79, respiratory rate 16, blood pressure 132/67.  Weight is 188. Head and neck:  Normocephalic, atraumatic skull.  There are no ocular or oral lesions.  There are no palpable cervical or supraclavicular lymph nodes.  Lungs:  Clear to percussion and auscultation bilaterally. Cardiac:  Regular rate and rhythm with a normal S1 and S2.  There are no murmurs, rubs or bruits.  Abdomen:  Soft with good bowel sounds.  There is no palpable abdominal mass.  There may be some slight distention. There is no fluid wave.  There is no palpable hepatosplenomegaly. Extremities:  No clubbing, cyanosis or edema.  Neurological:  No focal neurological deficits.  LABORATORY STUDIES:  White cell count is 6.3, hemoglobin 13.3, hematocrit 41, platelet count 136.  IMPRESSION:  [name] is a very nice 79 year old white female with cirrhosis.  She has nonalcoholic steatohepatitis.  She,  unfortunately, has now developed a biopsy-proven hepatocellular carcinoma.  I really feel that this is going to be the best way to try to prevent this from becoming an common issue with respect spread and extrahepatic disease.  I think that she would be able to tolerate this procedure. The tumor still quite small and should be really readily amenable to RFA.  I will plan to get her back probably in about 2 or 3 months.  By then, I think that she would have had her procedure.    ______________________________ Josph Macho, M.D. PRE/MEDQ  D:  01/25/2013  T:  01/26/2013  Job:  [id number]

## 2013-01-26 NOTE — Telephone Encounter (Signed)
Received a phone call from Tammy at [facility] 01/25/13.She stated patient needs a microwave ablation of liver by the end of 5/14.Stated she will need a cardiac clearance.Patient called an appointment offered in our [facility] office today with Dr.Nahser.Patient stated she does not have a way.Stated her daughter works 3rd shift and she is not able to take her today.Stated she would rather come to [place] office.Patient was told will check for appointment in the [facility] office and call her back.

## 2013-01-27 ENCOUNTER — Encounter: Payer: Self-pay | Admitting: *Deleted

## 2013-01-29 ENCOUNTER — Encounter: Payer: Self-pay | Admitting: Cardiology

## 2013-01-29 ENCOUNTER — Ambulatory Visit (INDEPENDENT_AMBULATORY_CARE_PROVIDER_SITE_OTHER): Payer: Medicare Other | Admitting: Cardiology

## 2013-01-29 VITALS — BP 142/74 | HR 94 | Ht 66.0 in | Wt 186.4 lb

## 2013-01-29 DIAGNOSIS — I2510 Atherosclerotic heart disease of native coronary artery without angina pectoris: Secondary | ICD-10-CM

## 2013-01-29 DIAGNOSIS — Z01818 Encounter for other preprocedural examination: Secondary | ICD-10-CM

## 2013-01-29 DIAGNOSIS — D509 Iron deficiency anemia, unspecified: Secondary | ICD-10-CM

## 2013-01-29 DIAGNOSIS — I119 Hypertensive heart disease without heart failure: Secondary | ICD-10-CM

## 2013-01-29 DIAGNOSIS — I10 Essential (primary) hypertension: Secondary | ICD-10-CM

## 2013-01-29 NOTE — Assessment & Plan Note (Signed)
Blood pressure is stable on current therapy.  No symptoms of CHF.  No palpitations or dizziness

## 2013-01-29 NOTE — Assessment & Plan Note (Signed)
Patient has chronic GI bleeding from angiodysplasia of the colon.  She now also has liver cancer.

## 2013-01-29 NOTE — Progress Notes (Signed)
[name] Date of Birth:  [birth year] [facility] [address] [place], [place]  [address] [phone]         Fax   [phone]  History of Present Illness: This pleasant 79 year old woman is seen by me for the first time today.  She is the mother-in-law of our former Scientist, product/process development at [facility] cardiology.  The patient has a history of liver cancer and is being seen for preoperative clearance for general anesthesia prior to undergoing radiofrequency ablation by interventional radiology for a lesion on her liver.  The patient has a history of mild coronary artery disease.  She had a cardiac catheterization following a small myocardial infarction 7 years ago by Dr. [place].  We do not have those records available.  The patient states that she was told that there was a very small posterior artery which was the cause of the infarction but that no intervention was necessary.  Subsequently the patient has done well and she has not been experiencing any exertional chest pain or dyspnea.  She has not been experiencing any dizziness or syncope.  He has not been aware of any palpitations.  She has a history chronic GI bleeding.  She sees hematology and she receives periodic infusions of intravenous iron.  She was hospitalized in December 2013 because of GI bleeding.  She would not be a candidate for long-term antiplatelet therapy.  She has had a past history of mild hypertension and a history of hypercholesterolemia.  Current Outpatient Prescriptions  Medication Sig Dispense Refill  . ALPRAZolam (XANAX) 1 MG tablet Take 0.5 mg by mouth at bedtime as needed for sleep or anxiety.      Marland Kitchen atenolol (TENORMIN) 100 MG tablet Take 100 mg by mouth daily before breakfast.       . bumetanide (BUMEX) 0.5 MG tablet Take 0.5 mg by mouth continuous as needed. Edema      . cholecalciferol (VITAMIN D) 1000 UNITS tablet Take 1,000 Units by mouth daily.      . cyclobenzaprine (FLEXERIL) 10 MG  tablet Take 10 mg by mouth 2 (two) times daily as needed. MUSCLE SPASM.      Marland Kitchen NITROSTAT 0.4 MG SL tablet Place 0.4 mg under the tongue.       Marland Kitchen omeprazole (PRILOSEC) 40 MG capsule Take 40 mg by mouth 2 (two) times daily.       Marland Kitchen triamcinolone cream (KENALOG) 0.1 % Apply 1 application topically as needed. Eczema      . [DISCONTINUED] fluvastatin XL (LESCOL XL) 80 MG 24 hr tablet Take 80 mg by mouth daily.         No current facility-administered medications for this visit.    Allergies  Allergen Reactions  . Moxifloxacin   . Sulfa Antibiotics   . Ciprofloxacin Other (See Comments)    Complains of sore mouth    Patient Active Problem List   Diagnosis Date Noted  . Liver mass 09/04/2012  . Pleural effusion on right 09/03/2012  . Hiatal hernia 09/02/2012  . Cameron lesion, acute 09/02/2012  . UGIB (upper gastrointestinal bleed) 09/02/2012  . Thrombocytopenia 09/02/2012  . Cirrhosis 08/31/2012  . Portal hypertension 08/31/2012  . Fatigue 08/31/2012  . Moderate malnutrition in the context of acute illness 08/31/2012  . Melena 08/29/2012  . Hematemesis 08/29/2012  . TIA (transient ischemic attack) 12/07/2011  . Hypertension 12/07/2011  . Thrombophlebitis 12/07/2011  . Hard of hearing 12/07/2011  . Macular degeneration 12/07/2011  . Diabetes  mellitus 12/07/2011  . Coronary artery disease 12/07/2011  . Hyperlipidemia LDL goal < 70 12/07/2011  . Anemia, iron deficiency 08/07/2011  . Angiodysplasia of intestinal tract 08/07/2011    History  Smoking status  . Former Smoker  . Quit date: 07/23/1990  Smokeless tobacco  . Never Used    History  Alcohol Use No    Family History  Problem Relation Age of Onset  . Colon cancer Sister   . Coronary artery disease Father     Review of Systems: Constitutional: no fever chills diaphoresis or fatigue or change in weight.  Head and neck: no hearing loss, no epistaxis, no photophobia or visual disturbance. Respiratory: No cough,  shortness of breath or wheezing. Cardiovascular: No chest pain peripheral edema, palpitations. Gastrointestinal: No abdominal distention, no abdominal pain, no change in bowel habits hematochezia or melena. Genitourinary: No dysuria, no frequency, no urgency, no nocturia. Musculoskeletal:No arthralgias, no back pain, no gait disturbance or myalgias. Neurological: No dizziness, no headaches, no numbness, no seizures, no syncope, no weakness, no tremors. Hematologic: No lymphadenopathy, no easy bruising. Psychiatric: No confusion, no hallucinations, no sleep disturbance.    Physical Exam: Filed Vitals:   01/29/13 1042  BP: 142/74  Pulse: 94   on examination is a well-developed well-nourished woman who is slightly hard of hearing.The head and neck exam reveals pupils equal and reactive.  Extraocular movements are full.  There is no scleral icterus.  The mouth and pharynx are normal.  The neck is supple.  The carotids reveal no bruits.  The jugular venous pressure is normal.  The  thyroid is not enlarged.  There is no lymphadenopathy.  The chest is clear to percussion and auscultation.  There are no rales or rhonchi.  Expansion of the chest is symmetrical.  The precordium is quiet.  The first heart sound is normal.  The second heart sound is physiologically split.  There is no  gallop rub or click.  There is a soft systolic ejection murmur at the left sternal edge.  There is no diastolic murmur. There is no abnormal lift or heave.  The abdomen is soft and nontender.  The bowel sounds are normal.  The liver and spleen are not enlarged.  There are no abdominal masses.  There are no abdominal bruits.  Extremities reveal good pedal pulses.  There is no phlebitis or edema.  There is no cyanosis or clubbing.  Strength is normal and symmetrical in all extremities.  There is no lateralizing weakness.  There are no sensory deficits.  The skin is warm and dry.  There is no rash.  EKG today shows normal sinus  rhythm with left axis deviation and incomplete left bundle branch block. Since 3/no significant change15/13,   Assessment / Plan: The patient has a remote history of ischemic heart disease with remote non-STEMI about 7 years ago.  No subsequent symptoms.  She is not experiencing any chest pain or symptoms of CHF.  She has a soft systolic murmur and we will evaluate the murmur and her LV function with an echocardiogram.  At this point I would anticipate that she would be a satisfactory candidate for general anesthesia.  Final recommendation awaits results of 2-D echo.

## 2013-01-29 NOTE — Assessment & Plan Note (Signed)
The patient has not been experiencing any recent symptoms to suggest angina pectoris.  No chest pain.  No sublingual nitroglycerin usage.

## 2013-01-29 NOTE — Patient Instructions (Signed)

## 2013-02-02 ENCOUNTER — Other Ambulatory Visit: Payer: Self-pay

## 2013-02-02 ENCOUNTER — Ambulatory Visit (HOSPITAL_COMMUNITY): Payer: Medicare Other | Attending: Cardiology | Admitting: Radiology

## 2013-02-02 DIAGNOSIS — E785 Hyperlipidemia, unspecified: Secondary | ICD-10-CM | POA: Insufficient documentation

## 2013-02-02 DIAGNOSIS — I059 Rheumatic mitral valve disease, unspecified: Secondary | ICD-10-CM | POA: Insufficient documentation

## 2013-02-02 DIAGNOSIS — I2510 Atherosclerotic heart disease of native coronary artery without angina pectoris: Secondary | ICD-10-CM | POA: Insufficient documentation

## 2013-02-02 DIAGNOSIS — Z87891 Personal history of nicotine dependence: Secondary | ICD-10-CM | POA: Insufficient documentation

## 2013-02-02 DIAGNOSIS — Z01818 Encounter for other preprocedural examination: Secondary | ICD-10-CM

## 2013-02-02 DIAGNOSIS — Z01810 Encounter for preprocedural cardiovascular examination: Secondary | ICD-10-CM

## 2013-02-02 DIAGNOSIS — I252 Old myocardial infarction: Secondary | ICD-10-CM | POA: Insufficient documentation

## 2013-02-02 DIAGNOSIS — Z8673 Personal history of transient ischemic attack (TIA), and cerebral infarction without residual deficits: Secondary | ICD-10-CM | POA: Insufficient documentation

## 2013-02-02 DIAGNOSIS — I119 Hypertensive heart disease without heart failure: Secondary | ICD-10-CM

## 2013-02-02 DIAGNOSIS — E119 Type 2 diabetes mellitus without complications: Secondary | ICD-10-CM | POA: Insufficient documentation

## 2013-02-02 DIAGNOSIS — I10 Essential (primary) hypertension: Secondary | ICD-10-CM | POA: Insufficient documentation

## 2013-02-02 DIAGNOSIS — I079 Rheumatic tricuspid valve disease, unspecified: Secondary | ICD-10-CM | POA: Insufficient documentation

## 2013-02-02 NOTE — Progress Notes (Signed)
Echocardiogram performed.  

## 2013-02-04 ENCOUNTER — Telehealth: Payer: Self-pay | Admitting: *Deleted

## 2013-02-04 NOTE — Telephone Encounter (Signed)
Advised patient

## 2013-02-04 NOTE — Telephone Encounter (Signed)
Message copied by Burnell Blanks on Thu Feb 04, 2013 10:54 AM ------      Message from: Cassell Clement      Created: Tue Feb 02, 2013  6:05 PM       Please report.  The echo shows normal systolic function. EF is good at 55-60%.  Okay to proceed with intervention for her liver problem. ------

## 2013-02-05 ENCOUNTER — Other Ambulatory Visit (HOSPITAL_COMMUNITY): Payer: Self-pay | Admitting: Interventional Radiology

## 2013-02-05 ENCOUNTER — Other Ambulatory Visit: Payer: Self-pay | Admitting: Oncology

## 2013-02-05 ENCOUNTER — Other Ambulatory Visit: Payer: Self-pay | Admitting: Radiology

## 2013-02-05 ENCOUNTER — Encounter (HOSPITAL_COMMUNITY): Payer: Self-pay | Admitting: Pharmacy Technician

## 2013-02-05 DIAGNOSIS — C228 Malignant neoplasm of liver, primary, unspecified as to type: Secondary | ICD-10-CM

## 2013-02-08 ENCOUNTER — Telehealth: Payer: Self-pay | Admitting: Emergency Medicine

## 2013-02-08 ENCOUNTER — Other Ambulatory Visit: Payer: Self-pay | Admitting: Radiology

## 2013-02-08 NOTE — Progress Notes (Signed)
01-19-2013 chest xray 2 view epic 01-29-2013 ekg epic 12-07-2011 carotid doppler epic cardiac clearance note dr Patty Sermons 01-29-2013 epic

## 2013-02-08 NOTE — Patient Instructions (Addendum)
20 [name]  02/08/2013   Your procedure is scheduled on: 02-12-2013  Report to [facility] at 600 am  Call this number if you have problems the morning of surgery [phone]  Remember:   Do not eat food or drink liquids :After Midnight.  Thursday NIGHT     Take these medicines the morning of surgery with A SIP OF WATER: ATENOLOL, PROLISEC                                          If needed-  NITROGLYCERIN, FLEXARIL                                SEE [place] PREPARING FOR SURGERY SHEET   Do not wear jewelry, make-up or nail polish.  Do not wear lotions, powders, or perfumes. You may wear deodorant.   Men may shave face and neck.  Do not bring valuables to the hospital.  Contacts, dentures or bridgework may not be worn into surgery.  Leave suitcase in the car. After surgery it may be brought to your room.  For patients admitted to the hospital, checkout time is 11:00 AM the day of discharge.   Patients discharged the day of surgery will not be allowed to drive home.  Name and phone number of your driver:daughter or son in law  Special Instructions: N/A   Please read over the following fact sheets that you were given:  blood fact sheet Call Theodis Aguas RN pre op nurse if needed [phone]    FAILURE TO FOLLOW THESE INSTRUCTIONS MAY RESULT IN THE CANCELLATION OF YOUR SURGERY. PATIENT SIGNATURE___________________________________________

## 2013-02-08 NOTE — Telephone Encounter (Signed)
CALLED PT TO MAKE HER AWARE THAT NO AUTHO WAS NEEDED W/ INS. FOR HER MWA OF LIVER. DR BRACKBILL CLEARED HER FOR PROCEDURE SHE IS SCHEDULED FOR THIS Friday 02-12-13 AT [facility] TO BE AT SHORT STAY AT 6:30AM

## 2013-02-09 ENCOUNTER — Encounter (HOSPITAL_COMMUNITY): Payer: Self-pay

## 2013-02-09 ENCOUNTER — Encounter (HOSPITAL_COMMUNITY)
Admission: RE | Admit: 2013-02-09 | Discharge: 2013-02-09 | Disposition: A | Discharge status: Home / Self Care | Payer: Medicare Other | Source: Ambulatory Visit | Attending: Interventional Radiology | Admitting: Interventional Radiology

## 2013-02-09 HISTORY — DX: Personal history of other medical treatment: Z92.89

## 2013-02-09 LAB — COMPREHENSIVE METABOLIC PANEL
ALT: 39 U/L — ABNORMAL HIGH (ref 0–35)
Albumin: 3.3 g/dL — ABNORMAL LOW (ref 3.5–5.2)
BUN: 10 mg/dL (ref 6–23)
Calcium: 10 mg/dL (ref 8.4–10.5)
GFR calc Af Amer: >90 mL/min (ref >90)
Glucose, Bld: 91 mg/dL (ref 70–99)
Sodium: 141 mEq/L (ref 135–145)
Total Protein: 7.1 g/dL (ref 6.0–8.3)

## 2013-02-09 LAB — PROTIME-INR
INR: 1.13 (ref 0.00–1.49)
Prothrombin Time: 14.3 seconds (ref 11.6–15.2)

## 2013-02-09 LAB — CBC
MCHC: 33.2 g/dL (ref 30.0–36.0)
RDW: 17.2 % — ABNORMAL HIGH (ref 11.5–15.5)
WBC: 5.7 10*3/uL (ref 4.0–10.5)

## 2013-02-09 LAB — APTT: aPTT: 34 seconds (ref 24–37)

## 2013-02-12 ENCOUNTER — Observation Stay (HOSPITAL_COMMUNITY)
Admission: RE | Admit: 2013-02-12 | Discharge: 2013-02-13 | Disposition: A | Discharge status: Home / Self Care | Payer: Medicare Other | Source: Ambulatory Visit | Attending: Interventional Radiology | Admitting: Interventional Radiology

## 2013-02-12 ENCOUNTER — Encounter (HOSPITAL_COMMUNITY): Payer: Self-pay

## 2013-02-12 ENCOUNTER — Ambulatory Visit (HOSPITAL_COMMUNITY)
Admission: RE | Admit: 2013-02-12 | Discharge: 2013-02-12 | Disposition: A | Discharge status: Home / Self Care | Payer: Medicare Other | Source: Ambulatory Visit | Attending: Interventional Radiology | Admitting: Interventional Radiology

## 2013-02-12 ENCOUNTER — Encounter (HOSPITAL_COMMUNITY)
Admission: RE | Disposition: A | Discharge status: Home / Self Care | Payer: Self-pay | Source: Ambulatory Visit | Attending: Interventional Radiology

## 2013-02-12 ENCOUNTER — Encounter (HOSPITAL_COMMUNITY): Payer: Self-pay | Admitting: Anesthesiology

## 2013-02-12 ENCOUNTER — Ambulatory Visit (HOSPITAL_COMMUNITY): Payer: Medicare Other | Admitting: Anesthesiology

## 2013-02-12 DIAGNOSIS — K253 Acute gastric ulcer without hemorrhage or perforation: Secondary | ICD-10-CM

## 2013-02-12 DIAGNOSIS — K7689 Other specified diseases of liver: Secondary | ICD-10-CM | POA: Insufficient documentation

## 2013-02-12 DIAGNOSIS — I739 Peripheral vascular disease, unspecified: Secondary | ICD-10-CM | POA: Insufficient documentation

## 2013-02-12 DIAGNOSIS — K5520 Angiodysplasia of colon without hemorrhage: Secondary | ICD-10-CM

## 2013-02-12 DIAGNOSIS — R5383 Other fatigue: Secondary | ICD-10-CM

## 2013-02-12 DIAGNOSIS — E785 Hyperlipidemia, unspecified: Secondary | ICD-10-CM

## 2013-02-12 DIAGNOSIS — H9190 Unspecified hearing loss, unspecified ear: Secondary | ICD-10-CM

## 2013-02-12 DIAGNOSIS — K219 Gastro-esophageal reflux disease without esophagitis: Secondary | ICD-10-CM | POA: Insufficient documentation

## 2013-02-12 DIAGNOSIS — R160 Hepatomegaly, not elsewhere classified: Secondary | ICD-10-CM

## 2013-02-12 DIAGNOSIS — Z01812 Encounter for preprocedural laboratory examination: Secondary | ICD-10-CM | POA: Insufficient documentation

## 2013-02-12 DIAGNOSIS — C228 Malignant neoplasm of liver, primary, unspecified as to type: Principal | ICD-10-CM | POA: Insufficient documentation

## 2013-02-12 DIAGNOSIS — K922 Gastrointestinal hemorrhage, unspecified: Secondary | ICD-10-CM

## 2013-02-12 DIAGNOSIS — K921 Melena: Secondary | ICD-10-CM

## 2013-02-12 DIAGNOSIS — K7460 Unspecified cirrhosis of liver: Secondary | ICD-10-CM | POA: Insufficient documentation

## 2013-02-12 DIAGNOSIS — I2510 Atherosclerotic heart disease of native coronary artery without angina pectoris: Secondary | ICD-10-CM

## 2013-02-12 DIAGNOSIS — K449 Diaphragmatic hernia without obstruction or gangrene: Secondary | ICD-10-CM

## 2013-02-12 DIAGNOSIS — E440 Moderate protein-calorie malnutrition: Secondary | ICD-10-CM

## 2013-02-12 DIAGNOSIS — E119 Type 2 diabetes mellitus without complications: Secondary | ICD-10-CM | POA: Insufficient documentation

## 2013-02-12 DIAGNOSIS — H3530 Unspecified macular degeneration: Secondary | ICD-10-CM

## 2013-02-12 DIAGNOSIS — D696 Thrombocytopenia, unspecified: Secondary | ICD-10-CM

## 2013-02-12 DIAGNOSIS — G459 Transient cerebral ischemic attack, unspecified: Secondary | ICD-10-CM

## 2013-02-12 DIAGNOSIS — I252 Old myocardial infarction: Secondary | ICD-10-CM | POA: Insufficient documentation

## 2013-02-12 DIAGNOSIS — I809 Phlebitis and thrombophlebitis of unspecified site: Secondary | ICD-10-CM

## 2013-02-12 DIAGNOSIS — I10 Essential (primary) hypertension: Secondary | ICD-10-CM | POA: Insufficient documentation

## 2013-02-12 DIAGNOSIS — K920 Hematemesis: Secondary | ICD-10-CM

## 2013-02-12 DIAGNOSIS — K766 Portal hypertension: Secondary | ICD-10-CM | POA: Insufficient documentation

## 2013-02-12 DIAGNOSIS — Z8673 Personal history of transient ischemic attack (TIA), and cerebral infarction without residual deficits: Secondary | ICD-10-CM | POA: Insufficient documentation

## 2013-02-12 DIAGNOSIS — D509 Iron deficiency anemia, unspecified: Secondary | ICD-10-CM

## 2013-02-12 DIAGNOSIS — J90 Pleural effusion, not elsewhere classified: Secondary | ICD-10-CM

## 2013-02-12 HISTORY — DX: Other specified postprocedural states: Z98.890

## 2013-02-12 HISTORY — DX: Other specified postprocedural states: R11.2

## 2013-02-12 LAB — TYPE AND SCREEN: Antibody Screen: NEGATIVE

## 2013-02-12 SURGERY — RADIO FREQUENCY ABLATION
Anesthesia: General | Wound class: Clean

## 2013-02-12 MED ORDER — LACTATED RINGERS IV SOLN
INTRAVENOUS | Status: DC
  Administered 2013-02-12: 12:00:00 via INTRAVENOUS

## 2013-02-12 MED ORDER — ONDANSETRON HCL 4 MG/2ML IJ SOLN: 4.0000 mg | Freq: Four times a day (QID) | INTRAMUSCULAR | Status: DC | PRN

## 2013-02-12 MED ORDER — PROMETHAZINE HCL 25 MG/ML IJ SOLN: 6.2500 mg | INTRAMUSCULAR | Status: DC | PRN

## 2013-02-12 MED ORDER — SUCCINYLCHOLINE CHLORIDE 20 MG/ML IJ SOLN
INTRAMUSCULAR | Status: DC | PRN
  Administered 2013-02-12: 60 mg via INTRAVENOUS

## 2013-02-12 MED ORDER — DOCUSATE SODIUM 100 MG PO CAPS
100.0000 mg | ORAL_CAPSULE | Freq: Two times a day (BID) | ORAL | Status: DC
  Administered 2013-02-12 – 2013-02-13 (×2): 100 mg via ORAL
  Filled 2013-02-12 (×4): qty 1

## 2013-02-12 MED ORDER — ONDANSETRON HCL 4 MG/2ML IJ SOLN
INTRAMUSCULAR | Status: DC | PRN
  Administered 2013-02-12: 4 mg via INTRAVENOUS

## 2013-02-12 MED ORDER — DIPHENHYDRAMINE HCL 12.5 MG/5ML PO ELIX: 12.5000 mg | ORAL_SOLUTION | Freq: Four times a day (QID) | ORAL | Status: DC | PRN

## 2013-02-12 MED ORDER — CEFAZOLIN SODIUM-DEXTROSE 2-3 GM-% IV SOLR
2.0000 g | INTRAVENOUS | Status: AC
  Administered 2013-02-12: 2 g via INTRAVENOUS
  Filled 2013-02-12: qty 50

## 2013-02-12 MED ORDER — DEXTROSE 5 % IV SOLN
1.0000 g | INTRAVENOUS | Status: DC
  Administered 2013-02-12: 1 g via INTRAVENOUS
  Filled 2013-02-12 (×2): qty 10

## 2013-02-12 MED ORDER — FENTANYL CITRATE 0.05 MG/ML IJ SOLN
INTRAMUSCULAR | Status: DC | PRN
  Administered 2013-02-12: 50 ug via INTRAVENOUS
  Administered 2013-02-12: 100 ug via INTRAVENOUS
  Administered 2013-02-12: 50 ug via INTRAVENOUS

## 2013-02-12 MED ORDER — DIPHENHYDRAMINE HCL 50 MG/ML IJ SOLN: 12.5000 mg | Freq: Four times a day (QID) | INTRAMUSCULAR | Status: DC | PRN

## 2013-02-12 MED ORDER — LIDOCAINE HCL (CARDIAC) 20 MG/ML IV SOLN
INTRAVENOUS | Status: DC | PRN
  Administered 2013-02-12: 50 mg via INTRAVENOUS

## 2013-02-12 MED ORDER — SODIUM CHLORIDE 0.9 % IJ SOLN: 9.0000 mL | INTRAMUSCULAR | Status: DC | PRN

## 2013-02-12 MED ORDER — HYDROCODONE-ACETAMINOPHEN 5-325 MG PO TABS: 1.0000 | ORAL_TABLET | ORAL | Status: DC | PRN

## 2013-02-12 MED ORDER — GLYCOPYRROLATE 0.2 MG/ML IJ SOLN
INTRAMUSCULAR | Status: DC | PRN
  Administered 2013-02-12: .6 mg via INTRAVENOUS

## 2013-02-12 MED ORDER — FENTANYL 10 MCG/ML IV SOLN
INTRAVENOUS | Status: DC
  Administered 2013-02-12: 20 ug/h via INTRAVENOUS
  Administered 2013-02-12: 17:00:00 via INTRAVENOUS
  Administered 2013-02-13: 30 ug via INTRAVENOUS
  Filled 2013-02-12 (×2): qty 50

## 2013-02-12 MED ORDER — DEXTROSE IN LACTATED RINGERS 5 % IV SOLN: INTRAVENOUS | Status: DC

## 2013-02-12 MED ORDER — CISATRACURIUM BESYLATE (PF) 10 MG/5ML IV SOLN
INTRAVENOUS | Status: DC | PRN
  Administered 2013-02-12: 4 mg via INTRAVENOUS
  Administered 2013-02-12: 1 mg via INTRAVENOUS
  Administered 2013-02-12: 8 mg via INTRAVENOUS

## 2013-02-12 MED ORDER — SENNOSIDES-DOCUSATE SODIUM 8.6-50 MG PO TABS
1.0000 | ORAL_TABLET | Freq: Every day | ORAL | Status: DC | PRN
  Administered 2013-02-12: 1 via ORAL
  Filled 2013-02-12: qty 1

## 2013-02-12 MED ORDER — BOOST PLUS PO LIQD
237.0000 mL | Freq: Two times a day (BID) | ORAL | Status: DC
  Administered 2013-02-13: 237 mL via ORAL
  Filled 2013-02-12 (×3): qty 237

## 2013-02-12 MED ORDER — PROPOFOL 10 MG/ML IV BOLUS
INTRAVENOUS | Status: DC | PRN
  Administered 2013-02-12: 100 mg via INTRAVENOUS

## 2013-02-12 MED ORDER — NEOSTIGMINE METHYLSULFATE 1 MG/ML IJ SOLN
INTRAMUSCULAR | Status: DC | PRN
  Administered 2013-02-12: 4 mg via INTRAVENOUS

## 2013-02-12 MED ORDER — NALOXONE HCL 0.4 MG/ML IJ SOLN: 0.4000 mg | INTRAMUSCULAR | Status: DC | PRN

## 2013-02-12 MED ORDER — EPHEDRINE SULFATE 50 MG/ML IJ SOLN
INTRAMUSCULAR | Status: DC | PRN
  Administered 2013-02-12: 10 mg via INTRAVENOUS

## 2013-02-12 MED ORDER — HYDROMORPHONE HCL PF 1 MG/ML IJ SOLN
0.2500 mg | INTRAMUSCULAR | Status: DC | PRN
  Administered 2013-02-12 (×2): 0.5 mg via INTRAVENOUS

## 2013-02-12 MED ORDER — LACTATED RINGERS IV SOLN
INTRAVENOUS | Status: DC
  Administered 2013-02-12: 08:00:00 via INTRAVENOUS

## 2013-02-12 NOTE — Anesthesia Preprocedure Evaluation (Addendum)
Anesthesia Evaluation  Patient identified by MRN, date of birth, ID band Patient awake    Reviewed: Allergy & Precautions, H&P , NPO status , Patient's Chart, lab work & pertinent test results  Airway Mallampati: II TM Distance: >3 FB Neck ROM: Full    Dental  (+) Edentulous Upper   Pulmonary neg pulmonary ROS,  breath sounds clear to auscultation  Pulmonary exam normal       Cardiovascular hypertension, + CAD, + Past MI and + Peripheral Vascular Disease Rhythm:Regular Rate:Normal   - Left ventricle: The cavity size was normal. There was mild   focal basal hypertrophy of the septum. Systolic function   was normal. The estimated ejection fraction was in the   range of 55% to 60%. Wall motion was normal; there were no   regional wall motion abnormalities. Doppler parameters are   consistent with abnormal left ventricular relaxation   (grade 1 diastolic dysfunction). - Pulmonary arteries: PA peak pressure: 32mm Hg (S).     Neuro/Psych TIAnegative psych ROS   GI/Hepatic Neg liver ROS, GERD-  Medicated,  Endo/Other  negative endocrine ROSdiabetes  Renal/GU negative Renal ROS  negative genitourinary   Musculoskeletal negative musculoskeletal ROS (+)   Abdominal   Peds negative pediatric ROS (+)  Hematology negative hematology ROS (+)   Anesthesia Other Findings   Reproductive/Obstetrics negative OB ROS                         Anesthesia Physical Anesthesia Plan  ASA: III  Anesthesia Plan: General   Post-op Pain Management:    Induction: Intravenous  Airway Management Planned: Oral ETT  Additional Equipment:   Intra-op Plan:   Post-operative Plan: Extubation in OR  Informed Consent: I have reviewed the patients History and Physical, chart, labs and discussed the procedure including the risks, benefits and alternatives for the proposed anesthesia with the patient or authorized  representative who has indicated his/her understanding and acceptance.   Dental advisory given  Plan Discussed with: CRNA and Surgeon  Anesthesia Plan Comments:         Anesthesia Quick Evaluation

## 2013-02-12 NOTE — Procedures (Signed)
Technically successful CT guided microwave ablation of known HCC within the inf aspect of the right lobe of the liver.  No immediate post procedural complications.

## 2013-02-12 NOTE — Progress Notes (Signed)
INITIAL NUTRITION ASSESSMENT  DOCUMENTATION CODES Per approved criteria  -Obesity Unspecified   INTERVENTION: - Boost Plus BID - Encouraged PO intake - Will continue to monitor   NUTRITION DIAGNOSIS: Predicted suboptimal energy intake related to history of poor appetite as evidenced by pt statement.   Goal: Pt to consume >90% of meals/supplements  Monitor:  Weights, labs, intake  Reason for Assessment: Nutrition risk   79 y.o. female  Admitting Dx: Microwave ablation   ASSESSMENT: Pt with history of cirrhosis, portal venous hypertension, and hepatocellular CA admitted for microwave ablation of right lobe hepatocellular CA. Met with pt who reports poor appetite on/off for the past 3 months and has sometimes had to force herself to eat. Pt reports losing 5 pounds unintentionally since December 2013. Pt reports drinking 1-2 Boost/day. Pt denies any problems chewing or swallowing.   Height: Ht Readings from Last 1 Encounters:  02/09/13 5\' 6"  (1.676 m)    Weight: Wt Readings from Last 1 Encounters:  02/09/13 186 lb (84.369 kg)    Ideal Body Weight: 130 lb  % Ideal Body Weight: 143  Wt Readings from Last 10 Encounters:  02/09/13 186 lb (84.369 kg)  01/29/13 186 lb 6.4 oz (84.55 kg)  01/25/13 188 lb (85.276 kg)  12/24/12 187 lb (84.823 kg)  12/18/12 190 lb (86.183 kg)  12/09/12 186 lb (84.369 kg)  10/28/12 185 lb (83.915 kg)  10/20/12 185 lb (83.915 kg)  08/29/12 181 lb (82.101 kg)  08/29/12 181 lb (82.101 kg)    Usual Body Weight: 191 lb   % Usual Body Weight: 97  BMI:  30.1 kg/(m^2)  Estimated Nutritional Needs: Kcal: 1450-1650 Protein: 60-70g Fluid: 1.4-1.6L/day  Skin: Intact   Diet Order: Cardiac  EDUCATION NEEDS: -No education needs identified at this time   Intake/Output Summary (Last 24 hours) at 02/12/13 1732 Last data filed at 02/12/13 1143  Gross per 24 hour  Intake   1000 ml  Output    300 ml  Net    700 ml    Last BM:  PTA  Labs:   Recent Labs Lab 02/09/13 1155  NA 141  K 4.3  CL 107  CO2 26  BUN 10  CREATININE 0.70  CALCIUM 10.0  GLUCOSE 91    CBG (last 3)  No results found for this basename: GLUCAP,  in the last 72 hours  Scheduled Meds: . cefTRIAXone (ROCEPHIN)  IV  1 g Intravenous Q24H  . docusate sodium  100 mg Oral BID  . fentaNYL   Intravenous Q4H    Continuous Infusions:   Past Medical History  Diagnosis Date  . Anemia, iron deficiency 08/07/2011  . Angiodysplasia of intestinal tract 08/07/2011  . Hypertension   . Arthritis   . Hx of gastroesophageal reflux (GERD)   . Diverticulitis   . Phlebitis   . Macular degeneration   . TIA (transient ischemic attack)   . MI (myocardial infarction)   . Hiatal hernia   . AVM (arteriovenous malformation) of colon with hemorrhage   . Varicose veins of legs   . Cataract   . GERD (gastroesophageal reflux disease)   . DJD (degenerative joint disease)   . Diabetes mellitus     borderline  . History of blood transfusion   . PONV (postoperative nausea and vomiting)     Past Surgical History  Procedure Laterality Date  . 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 Abdominal hysterectomy    . Hemorrhoid surgery    .  Esophagogastroduodenoscopy  08/30/2012    Procedure: ESOPHAGOGASTRODUODENOSCOPY (EGD);  Surgeon: [name], MD;  Location: [facility];  Service: Endoscopy;  Laterality: N/A;     Levon Hedger MS, RD, LDN [phone] Pager [phone] After Hours Pager

## 2013-02-12 NOTE — H&P (Signed)
[name] is an 79 y.o. female.   Chief Complaint: "I'm having a procedure on my liver" HPI: Patient with history of NASH induced cirrhosis, portal venous hypertension and hepatocellular carcinoma of right lobe liver presents today for CT guided percutaneous microwave ablation of the right lobe HCC.  Past Medical History  Diagnosis Date  . Anemia, iron deficiency 08/07/2011  . Angiodysplasia of intestinal tract 08/07/2011  . Hypertension   . Arthritis   . Hx of gastroesophageal reflux (GERD)   . Diverticulitis   . Phlebitis   . Macular degeneration   . TIA (transient ischemic attack)   . MI (myocardial infarction)   . Hiatal hernia   . AVM (arteriovenous malformation) of colon with hemorrhage   . Varicose veins of legs   . Cataract   . GERD (gastroesophageal reflux disease)   . DJD (degenerative joint disease)   . Diabetes mellitus     borderline  . History of blood transfusion     Past Surgical History  Procedure Laterality Date  . 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 Abdominal hysterectomy    . Hemorrhoid surgery    . Esophagogastroduodenoscopy  08/30/2012    Procedure: ESOPHAGOGASTRODUODENOSCOPY (EGD);  Surgeon: [name], MD;  Location: [facility];  Service: Endoscopy;  Laterality: N/A;    Family History  Problem Relation Age of Onset  . Colon cancer Sister   . Coronary artery disease Father    Social History:  reports that she quit smoking about 22 years ago. She has never used smokeless tobacco. She reports that she does not drink alcohol or use illicit drugs.  Allergies:  Allergies  Allergen Reactions  . Moxifloxacin   . Sulfa Antibiotics   . Ciprofloxacin Other (See Comments)    Complains of sore mouth    Current facility-administered medications:ceFAZolin (ANCEF) IVPB 2 g/50 mL premix, 2 g, Intravenous, On Call, D Kevin Bradey Luzier, PA-C;  lactated ringers infusion, , Intravenous, Continuous, D Jeananne [name], PA-C Current outpatient  prescriptions:[DISCONTINUED] fluvastatin XL (LESCOL XL) 80 MG 24 hr tablet, Take 80 mg by mouth daily.  , Disp: , Rfl:    Results for orders placed during the hospital encounter of 02/09/13  COMPREHENSIVE METABOLIC PANEL      Result Value Range   Sodium 141  135 - 145 mEq/L   Potassium 4.3  3.5 - 5.1 mEq/L   Chloride 107  96 - 112 mEq/L   CO2 26  19 - 32 mEq/L   Glucose, Bld 91  70 - 99 mg/dL   BUN 10  6 - 23 mg/dL   Creatinine, Ser 1.61  0.50 - 1.10 mg/dL   Calcium 09.6  8.4 - 04.5 mg/dL   Total Protein 7.1  6.0 - 8.3 g/dL   Albumin 3.3 (*) 3.5 - 5.2 g/dL   AST 56 (*) 0 - 37 U/L   ALT 39 (*) 0 - 35 U/L   Alkaline Phosphatase 157 (*) 39 - 117 U/L   Total Bilirubin 0.6  0.3 - 1.2 mg/dL   GFR calc non Af Amer 79 (*) >90 mL/min   GFR calc Af Amer >90  >90 mL/min  APTT      Result Value Range   aPTT 34  24 - 37 seconds  CBC      Result Value Range   WBC 5.7  4.0 - 10.5 K/uL   RBC 4.66  3.87 - 5.11 MIL/uL   Hemoglobin 13.6  12.0 -  15.0 g/dL   HCT 14.7  82.9 - 56.2 %   MCV 88.0  78.0 - 100.0 fL   MCH 29.2  26.0 - 34.0 pg   MCHC 33.2  30.0 - 36.0 g/dL   RDW 13.0 (*) 86.5 - 78.4 %   Platelets 129 (*) 150 - 400 K/uL  PROTIME-INR      Result Value Range   Prothrombin Time 14.3  11.6 - 15.2 seconds   INR 1.13  0.00 - 1.49  TYPE AND SCREEN      Result Value Range   ABO/RH(D) O NEG     Antibody Screen NEG     Sample Expiration 02/15/2013      Review of Systems  Constitutional: Positive for weight loss and malaise/fatigue. Negative for fever and chills.  HENT:       Pt with occ hoarseness in am  Respiratory: Negative for cough and shortness of breath.   Cardiovascular: Negative for chest pain.  Gastrointestinal: Negative for nausea, vomiting and abdominal pain.       Occ reflux  Musculoskeletal: Negative for back pain.  Neurological: Positive for weakness. Negative for headaches.  Endo/Heme/Allergies: Does not bruise/bleed easily.    Blood pressure 137/63, pulse 66,  temperature 97.9 F (36.6 C), temperature source Oral, resp. rate 18, SpO2 96.00%. Physical Exam  Constitutional: She is oriented to person, place, and time. She appears well-developed and well-nourished.  Cardiovascular: Normal rate and regular rhythm.   Respiratory: Effort normal.  Few bibasilar crackles  GI: Soft. Bowel sounds are normal. There is no tenderness.  Mildly protuberant  Musculoskeletal: Normal range of motion.  Trace to 1+ LE edema, scatt varicosities  Neurological: She is alert and oriented to person, place, and time.     Assessment/Plan Pt with cirrhosis, portal HTN and right lobe HCC. Plan is for CT guided percutaneous microwave ablation of right lobe HCC today. Details/risks of procedure d/w pt/daughter with their understanding and consent. Postprocedure the pt will be admitted for overnight observation.  [name],D [facility] 02/12/2013, 8:01 AM

## 2013-02-12 NOTE — Anesthesia Procedure Notes (Signed)
Procedure Name: Intubation Date/Time: 02/12/2013 8:33 AM Performed by: Leroy Libman L Patient Re-evaluated:Patient Re-evaluated prior to inductionOxygen Delivery Method: Circle system utilized Preoxygenation: Pre-oxygenation with 100% oxygen Intubation Type: IV induction Ventilation: Mask ventilation without difficulty and Oral airway inserted - appropriate to patient size Laryngoscope Size: Hyacinth Meeker and 2 Grade View: Grade I Tube type: Oral Tube size: 7.5 mm Number of attempts: 1 Airway Equipment and Method: Stylet Placement Confirmation: ETT inserted through vocal cords under direct vision,  positive ETCO2 and breath sounds checked- equal and bilateral Secured at: 21 cm Tube secured with: Tape Dental Injury: Teeth and Oropharynx as per pre-operative assessment

## 2013-02-12 NOTE — Preoperative (Signed)
Beta Blockers   Reason not to administer Beta Blockers:Not Applicable 

## 2013-02-12 NOTE — Progress Notes (Signed)
02/12/13 1800 Patient c/o feeling bloated/gassy. Dressing dry, no drainage, and intact.

## 2013-02-12 NOTE — Progress Notes (Signed)
Subjective: Pt doing fairly well; has had some intermittent mild nausea; denies abd pain; family in room  Objective: Vital signs in last 24 hours: Temp:  [97.4 F (36.3 C)-97.9 F (36.6 C)] 97.6 F (36.4 C) (05/23 1217) Pulse Rate:  [48-67] 54 (05/23 1217) Resp:  [14-19] 15 (05/23 1217) BP: (103-137)/(45-76) 118/76 mmHg (05/23 1217) SpO2:  [92 %-96 %] 93 % (05/23 1217)    Intake/Output from previous day:   Intake/Output this shift: Total I/O In: 800 [I.V.:800] Out: 300 [Urine:300]  Puncture site RUQ clean and dry, NT, no evidence of bleeding  Lab Results:  No results found for this basename: WBC, HGB, HCT, PLT,  in the last 72 hours BMET No results found for this basename: NA, K, CL, CO2, GLUCOSE, BUN, CREATININE, CALCIUM,  in the last 72 hours PT/INR No results found for this basename: LABPROT, INR,  in the last 72 hours ABG No results found for this basename: PHART, PCO2, PO2, HCO3,  in the last 72 hours  Studies/Results: Ct Guide Tissue Ablation  02/12/2013   *RADIOLOGY REPORT*  Clinical Data:  History of NASH induced cirrhosis, now with biopsy- proven solitary HCC  CT-GUIDED PERCUTANEOUS MICROWAVE ABLATION OF LIVER LESION.  Comparison: Abdominal MRI - 01/04/2013; 08/31/2012; CT abdomen and pelvis - 11/30/2012; PET CT - 12/17/2012; ultrasound guided liver lesion of biopsy - 12/09/2012  Anesthesia:  General  Medications:  Ancef 2 gram IV.  The antibiotic was administered in an appropriate time interval prior to needle puncture of the skin.  Contrast: None  Complications: None immediate  Procedure:  The procedure, risks, benefits, and alternatives were explained to the patient.  Questions regarding the procedure were encouraged and answered.  The patient understands and consents to the procedure.  The patient was placed under general anesthesia.  Initial unenhanced CT was performed in a supine position to localize slightly hypoattenuating approximately 2.3 x 1.8 cm lesion  within the subcapsular aspect of the right lobe of the liver (image 34, series 2).  The procedure was planned.  The skin overlying the right anterior lateral upper abdominal quadrant was prepped with Betadine in a sterile fashion, and a sterile drape was applied covering the operative field.  A sterile gown and sterile gloves were used for the procedure.  A 22 gauge spinal needle was utilized for procedural planning. Once the appropriate trajectory was established, a Covidien percutaneous microwave ablation probe was advanced into the hypoattenuating hepatic lesion under intermittent CT guidance. Once appropriate positioning was confirmed. An 18 gauge trocar needle was advanced adjacent to the caudal aspect of the right lobe of the liver and a mixture of saline and dilute contrast was instilled for the purposes of hydro dissection.  CT images confirmed appropriate positioning of the microwave ablation probe and a coagulation cycle was achieved with 45 watts of power for 2 minutes.  This was followed by an ablation cycle of 75 watts for 4 minutes.  Multiple spot fluoroscopic images were obtained both during the coagulation as well as the ablation cycles confirming an appropriate ablation zone.  The ablation probe was removed and hemostasis was achieved with manual compression.  A limited completion CT scan was performed.  A dressing was placed.  Findings:  Preprocedural scanning demonstrates grossly unchanged appearance of hypoattenuating approximately 2.3 x 1.8 cm lesion within the subcapsular caudal aspect of the right lobe of the liver.  Under intermittent CT guidance, a percutaneous microwave ablation probe was inserted into the mass. A mixture of saline and  dilute contrast was injected caudal to the right lobe liver edge for the purposes of hydrodissection  Post ablation imaging demonstrates an adequate ablation zone and was negative for complication.  IMPRESSION:  Technically successful CT guided percutaneous  microwave ablation of biopsy-proven hepatocellular carcinoma within the subcapsular caudal aspect of the right lobe of the liver.  The patient will be observed overnight.  Initial follow-up will be performed in approximately 4 weeks.   Original Report Authenticated By: Tacey Ruiz, MD    Anti-infectives: Anti-infectives   Start     Dose/Rate Route Frequency Ordered Stop   02/12/13 0643  ceFAZolin (ANCEF) IVPB 2 g/50 mL premix     2 g 100 mL/hr over 30 Minutes Intravenous On call 02/12/13 1610 02/12/13 0835      Assessment/Plan: s/p CT guided percutaneous microwave ablation of right hepatic lobe HCC. For overnight obs for hemodynamic monitoring. Check am labs. Zofran for nausea. F/u with Dr. Grace Isaac in IR clinic in 3-4 weeks.  LOS: 0 days    Brylinn Teaney,D [facility] 02/12/2013

## 2013-02-12 NOTE — Progress Notes (Signed)
Pt seen and examined with Jeananne [name], PA  Pt resting comfortably with no appreciable ablation related pain.  Mild nausea -> pt encouraged to progress as diet as tolerated.  Mobilize as tolerated.  BRP with assistance.  Hopeful for d/c in AM.  F/U in clinic in 3-4 weeks.

## 2013-02-12 NOTE — Anesthesia Postprocedure Evaluation (Signed)
  Anesthesia Post-op Note  Patient: [name]  Procedure(s) Performed: Procedure(s) (LRB): MICROWAVE LIVER ABLATION (N/A)  Patient Location: PACU  Anesthesia Type: General  Level of Consciousness: awake and alert   Airway and Oxygen Therapy: Patient Spontanous Breathing  Post-op Pain: mild  Post-op Assessment: Post-op Vital signs reviewed, Patient's Cardiovascular Status Stable, Respiratory Function Stable, Patent Airway and No signs of Nausea or vomiting  Last Vitals:  Filed Vitals:   02/12/13 1100  BP: 109/47  Pulse: 53  Temp:   Resp: 15    Post-op Vital Signs: stable   Complications: No apparent anesthesia complications

## 2013-02-12 NOTE — Transfer of Care (Signed)
Immediate Anesthesia Transfer of Care Note  Patient: [name]  Procedure(s) Performed: Procedure(s): MICROWAVE LIVER ABLATION (N/A)  Patient Location: PACU  Anesthesia Type:General  Level of Consciousness: awake, alert  and oriented  Airway & Oxygen Therapy: Patient Spontanous Breathing and Patient connected to nasal cannula oxygen  Post-op Assessment: Report given to PACU RN and Post -op Vital signs reviewed and stable  Post vital signs: Reviewed and stable  Complications: No apparent anesthesia complications

## 2013-02-12 NOTE — Care Management Note (Signed)
CARE MANAGEMENT NOTE 02/12/2013  Patient:  [name], [name]   Account Number:  [id number]  Date Initiated:  02/12/2013  Documentation initiated by:  Zaccai Chavarin  Subjective/Objective Assessment:   79 yo female admitted s/p microwave liver ablation.     Action/Plan:   Home when stable   Anticipated DC Date:  02/13/2013   Anticipated DC Plan:  HOME/SELF CARE  In-house referral  NA      DC Planning Services  CM consult      Choice offered to / List presented to:  NA   DME arranged  NA      DME agency  NA     HH arranged  NA      HH agency  NA   Status of service:  In process, will continue to follow Medicare Important Message given?   (If response is "NO", the following Medicare IM given date fields will be blank) Date Medicare IM given:   Date Additional Medicare IM given:    Discharge Disposition:    Per UR Regulation:  Reviewed for med. necessity/level of care/duration of stay  If discussed at Long Length of Stay Meetings, dates discussed:    Comments:  02/12/13 1335 Dessire Grimes,RN,BSN [phone] No needs identified at this time. Per MD note, pt to remain OBS overnight.

## 2013-02-13 LAB — COMPREHENSIVE METABOLIC PANEL
ALT: 44 U/L — ABNORMAL HIGH (ref 0–35)
AST: 97 U/L — ABNORMAL HIGH (ref 0–37)
Alkaline Phosphatase: 128 U/L — ABNORMAL HIGH (ref 39–117)
CO2: 26 mEq/L (ref 19–32)
Chloride: 103 mEq/L (ref 96–112)
GFR calc Af Amer: 87 mL/min — ABNORMAL LOW (ref >90)
GFR calc non Af Amer: 75 mL/min — ABNORMAL LOW (ref >90)
Glucose, Bld: 110 mg/dL — ABNORMAL HIGH (ref 70–99)
Potassium: 3.8 mEq/L (ref 3.5–5.1)
Sodium: 137 mEq/L (ref 135–145)

## 2013-02-13 LAB — CBC
Hemoglobin: 12.9 g/dL (ref 12.0–15.0)
Platelets: 153 10*3/uL (ref 150–400)
RBC: 4.5 MIL/uL (ref 3.87–5.11)
WBC: 8.5 10*3/uL (ref 4.0–10.5)

## 2013-02-13 MED ORDER — POTASSIUM CHLORIDE CRYS ER 20 MEQ PO TBCR
20.0000 meq | EXTENDED_RELEASE_TABLET | Freq: Once | ORAL | Status: AC
  Administered 2013-02-13: 20 meq via ORAL
  Filled 2013-02-13: qty 1

## 2013-02-13 NOTE — Progress Notes (Signed)
02/13/13 0930 Reviewed discharge instructions with patient and her son/daughter. Patient verbalized understanding of discharge. Copy of discharge instructions given to patient.

## 2013-02-13 NOTE — Discharge Summary (Signed)
Physician Discharge Summary  Patient ID: [name] MRN: [id number] DOB/AGE: 02/24/1928 79 y.o.  Admit date: 02/12/2013 Discharge date: 02/13/2013  Admission Diagnoses: Rt lobe liver lesion-  hepatic cellular cancer  Discharge Diagnoses: Microwave ablation of hepatic cellular cancer  Active problems:  Cirrhosis; HDL; TIA; HTN; Macular degeneration; Hiatal hernia; portal htn  Discharged Condition: stable; improved  Hospital Course: Pt with known NASH/ Cirrhosis with incidental Rt lobe liver lesion proven to be Hepatocellular Cancer.  Consulted with [name] 01/19/13 to discuss microwave ablation of this lesion and procedure was performed in Interventional Radiology with [name] on 02/12/13.  Pt has tolerated procedure well. Overnight stay was without event.  Slept okay; eating well; passing gas; urinating well. No complaints except mild soreness at site of treatment. Potassium slightly lower at 3.8 today. I have seen and examined pt today; discussed with [name] pts status. Plan for dc today.  Will administer 20 meq Potassium po. She will follow up with [name] in 3-4 weeks. Clinic will call pt with date and time. Phone numbers have been provided to pt for questions or concerns.   Consults: None  Significant Diagnostic Studies: Abd CT  Treatments: CT guided microwave ablation of Rt lobe liver lesion  Discharge Exam: Blood pressure 117/53, pulse 81, temperature 97.6 F (36.4 C), temperature source Oral, resp. rate 16, height 5\' 6"  (1.676 m), weight 195 lb 3.2 oz (88.542 kg), SpO2 94.00%.  PE:  A/O; VSS afeb Heart: RRR Lungs: CTA Abd: soft; +BS; NT Site of ablation mildly sore No hematoma; no redness; no swelling Extr: FROM Results for orders placed during the hospital encounter of 02/12/13  CBC      Result Value Range   WBC 8.5  4.0 - 10.5 K/uL   RBC 4.50  3.87 - 5.11 MIL/uL   Hemoglobin 12.9  12.0 - 15.0 g/dL   HCT 91.4  78.2 - 95.6 %   MCV 89.1  78.0 - 100.0 fL   MCH  28.7  26.0 - 34.0 pg   MCHC 32.2  30.0 - 36.0 g/dL   RDW 21.3 (*) 08.6 - 57.8 %   Platelets 153  150 - 400 K/uL  COMPREHENSIVE METABOLIC PANEL      Result Value Range   Sodium 137  135 - 145 mEq/L   Potassium 3.8  3.5 - 5.1 mEq/L   Chloride 103  96 - 112 mEq/L   CO2 26  19 - 32 mEq/L   Glucose, Bld 110 (*) 70 - 99 mg/dL   BUN 13  6 - 23 mg/dL   Creatinine, Ser 4.69  0.50 - 1.10 mg/dL   Calcium 9.1  8.4 - 62.9 mg/dL   Total Protein 6.4  6.0 - 8.3 g/dL   Albumin 2.8 (*) 3.5 - 5.2 g/dL   AST 97 (*) 0 - 37 U/L   ALT 44 (*) 0 - 35 U/L   Alkaline Phosphatase 128 (*) 39 - 117 U/L   Total Bilirubin 0.4  0.3 - 1.2 mg/dL   GFR calc non Af Amer 75 (*) >90 mL/min   GFR calc Af Amer 87 (*) >90 mL/min    Disposition: Rt lobe liver lesion proven HCC treated with microwave ablation In IR 02/12/13 by [name]. Pt tolerated well. Overnight stay without complication. Ii have seen and examined pt; discussed case with [name]. Plan for dc now. Continue all home meds. Follow up with [name] in 3-4 weeks; clinic will call pt. Pt aware  and has good understanding of all dc instructions. Pt has many family members to check on her daily. She has good understanding of this plan.  Discharge Orders   Future Orders Complete By Expires     IR Radiologist Eval & Mgmt  03/16/2013 04/15/2014    Questions:      Preferred Imaging Location?:  [facility]    Reason for Exam (SYMPTOM  OR DIAGNOSIS REQUIRED):  follow up with [name] in 3-4 weeks post MW ablation Rt lobe [facility]    Call MD for:  persistant nausea and vomiting  As directed     Call MD for:  redness, tenderness, or signs of infection (pain, swelling, redness, odor or green/yellow discharge around incision site)  As directed     Call MD for:  severe uncontrolled pain  As directed     Call MD for:  temperature >100.4  As directed     Diet - low sodium heart healthy  As directed     Discharge instructions  As directed     Comments:       Follow up with [name] in 3-4 weeks- clinic will call pt with time and date- [phone]; call [phone] if probs or questions    Discharge wound care:  As directed     Comments:      Change band aid at site daily x 1 week    Driving Restrictions  As directed     Comments:      No driving x 1 week    Increase activity slowly  As directed     Lifting restrictions  As directed     Comments:      No lifting over 10 lbs x 1 week        Medication List    TAKE these medications       ALPRAZolam 1 MG tablet  Commonly known as:  XANAX  Take 0.5 mg by mouth at bedtime as needed for sleep or anxiety.     atenolol 100 MG tablet  Commonly known as:  TENORMIN  Take 100 mg by mouth daily before breakfast.     bumetanide 2 MG tablet  Commonly known as:  BUMEX  Take 2 mg by mouth daily as needed (edema).     cholecalciferol 1000 UNITS tablet  Commonly known as:  VITAMIN D  Take 1,000 Units by mouth daily.     cyclobenzaprine 10 MG tablet  Commonly known as:  FLEXERIL  Take 10 mg by mouth 2 (two) times daily as needed. MUSCLE SPASM.     NITROSTAT 0.4 MG SL tablet  Generic drug:  nitroGLYCERIN  Place 0.4 mg under the tongue every 5 (five) minutes as needed for chest pain.     omeprazole 40 MG capsule  Commonly known as:  PRILOSEC  Take 40 mg by mouth 2 (two) times daily.     triamcinolone cream 0.1 %  Commonly known as:  KENALOG  Apply 1 application topically 2 (two) times daily as needed. Eczema         Signed: Caroll Waters A 02/13/2013, 8:25 AM

## 2013-02-13 NOTE — Progress Notes (Signed)
02/13/13 0845 Fentanyl PCA D/C'ed. Wasted 41mL of Fentanyl with Stark Klein, RN in the sink.

## 2013-02-15 ENCOUNTER — Encounter (HOSPITAL_COMMUNITY): Payer: Self-pay | Admitting: Emergency Medicine

## 2013-02-15 ENCOUNTER — Emergency Department (HOSPITAL_COMMUNITY)
Admission: EM | Admit: 2013-02-15 | Discharge: 2013-02-15 | Disposition: A | Discharge status: Home / Self Care | Payer: Medicare Other | Attending: Emergency Medicine | Admitting: Emergency Medicine

## 2013-02-15 DIAGNOSIS — Z79899 Other long term (current) drug therapy: Secondary | ICD-10-CM | POA: Insufficient documentation

## 2013-02-15 DIAGNOSIS — Z862 Personal history of diseases of the blood and blood-forming organs and certain disorders involving the immune mechanism: Secondary | ICD-10-CM | POA: Insufficient documentation

## 2013-02-15 DIAGNOSIS — Z8679 Personal history of other diseases of the circulatory system: Secondary | ICD-10-CM | POA: Insufficient documentation

## 2013-02-15 DIAGNOSIS — Z8672 Personal history of thrombophlebitis: Secondary | ICD-10-CM | POA: Insufficient documentation

## 2013-02-15 DIAGNOSIS — Z8739 Personal history of other diseases of the musculoskeletal system and connective tissue: Secondary | ICD-10-CM | POA: Insufficient documentation

## 2013-02-15 DIAGNOSIS — K219 Gastro-esophageal reflux disease without esophagitis: Secondary | ICD-10-CM | POA: Insufficient documentation

## 2013-02-15 DIAGNOSIS — I252 Old myocardial infarction: Secondary | ICD-10-CM | POA: Insufficient documentation

## 2013-02-15 DIAGNOSIS — Z8669 Personal history of other diseases of the nervous system and sense organs: Secondary | ICD-10-CM | POA: Insufficient documentation

## 2013-02-15 DIAGNOSIS — I10 Essential (primary) hypertension: Secondary | ICD-10-CM | POA: Insufficient documentation

## 2013-02-15 DIAGNOSIS — Z8673 Personal history of transient ischemic attack (TIA), and cerebral infarction without residual deficits: Secondary | ICD-10-CM | POA: Insufficient documentation

## 2013-02-15 DIAGNOSIS — Z8719 Personal history of other diseases of the digestive system: Secondary | ICD-10-CM | POA: Insufficient documentation

## 2013-02-15 DIAGNOSIS — R040 Epistaxis: Secondary | ICD-10-CM | POA: Insufficient documentation

## 2013-02-15 DIAGNOSIS — E119 Type 2 diabetes mellitus without complications: Secondary | ICD-10-CM | POA: Insufficient documentation

## 2013-02-15 DIAGNOSIS — Z87891 Personal history of nicotine dependence: Secondary | ICD-10-CM | POA: Insufficient documentation

## 2013-02-15 NOTE — ED Notes (Signed)
Per EMS pt comes from home c/o two nose bleeds today and hypertensive when EMS arrived on scene. 20g in Left forearm placed by EMS.  Per EMS pt did have PVCs on monitor.  Pt has PMH cirrhosis, low iron, and cancer.

## 2013-02-15 NOTE — ED Provider Notes (Signed)
History     CSN: [id number]  Arrival date & time 02/15/13  1807   First MD Initiated Contact with Patient 02/15/13 1820      Chief Complaint  Patient presents with  . Hypertension  . nose bleeds     (Consider location/radiation/quality/duration/timing/severity/associated sxs/prior treatment) Patient is a 79 y.o. female presenting with hypertension. The history is provided by the patient.  Hypertension   patient here after having a nosebleed from the right nares prior to arrival. She applied direct pressure and the.. Has some transient increase in her blood pressure. Denies any headaches. No chest pain chest pressure. No shortness of breath. No easy bruising noted. History of prior nosebleeds from that side as we denies any posterior drainage at this time.ll.  Past Medical History  Diagnosis Date  . Anemia, iron deficiency 08/07/2011  . Angiodysplasia of intestinal tract 08/07/2011  . Hypertension   . Arthritis   . Hx of gastroesophageal reflux (GERD)   . Diverticulitis   . Phlebitis   . Macular degeneration   . TIA (transient ischemic attack)   . MI (myocardial infarction)   . Hiatal hernia   . AVM (arteriovenous malformation) of colon with hemorrhage   . Varicose veins of legs   . Cataract   . GERD (gastroesophageal reflux disease)   . DJD (degenerative joint disease)   . Diabetes mellitus     borderline  . History of blood transfusion   . PONV (postoperative nausea and vomiting)     Past Surgical History  Procedure Laterality Date  . 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 Abdominal hysterectomy    . Hemorrhoid surgery    . Esophagogastroduodenoscopy  08/30/2012    Procedure: ESOPHAGOGASTRODUODENOSCOPY (EGD);  Surgeon: [name], MD;  Location: [facility];  Service: Endoscopy;  Laterality: N/A;    Family History  Problem Relation Age of Onset  . Colon cancer Sister   . Coronary artery disease Father     History  Substance Use Topics  .  Smoking status: Former Smoker    Quit date: 07/23/1990  . Smokeless tobacco: Never Used  . Alcohol Use: No    OB History   Grav Para Term Preterm Abortions TAB SAB Ect Mult Living                  Review of Systems  All other systems reviewed and are negative.    Allergies  Moxifloxacin; Sulfa antibiotics; and Ciprofloxacin  Home Medications   Current Outpatient Rx  Name  Route  Sig  Dispense  Refill  . ALPRAZolam (XANAX) 1 MG tablet   Oral   Take 0.5 mg by mouth at bedtime as needed for sleep or anxiety.         Marland Kitchen atenolol (TENORMIN) 100 MG tablet   Oral   Take 100 mg by mouth daily before breakfast.          . bumetanide (BUMEX) 2 MG tablet   Oral   Take 2 mg by mouth daily as needed (edema).         . cholecalciferol (VITAMIN D) 1000 UNITS tablet   Oral   Take 1,000 Units by mouth daily.         . cyclobenzaprine (FLEXERIL) 10 MG tablet   Oral   Take 10 mg by mouth 2 (two) times daily as needed. MUSCLE SPASM.         Marland Kitchen NITROSTAT 0.4 MG SL tablet  Sublingual   Place 0.4 mg under the tongue every 5 (five) minutes as needed for chest pain.          Marland Kitchen omeprazole (PRILOSEC) 40 MG capsule   Oral   Take 40 mg by mouth 2 (two) times daily.          Marland Kitchen triamcinolone cream (KENALOG) 0.1 %   Topical   Apply 1 application topically 2 (two) times daily as needed. Eczema           BP 131/53  Pulse 78  Temp(Src) 98.8 F (37.1 C) (Oral)  SpO2 95%  Physical Exam  Nursing note and vitals reviewed. Constitutional: She is oriented to person, place, and time. She appears well-developed and well-nourished.  Non-toxic appearance. No distress.  HENT:  Head: Normocephalic and atraumatic.  Eyes: Conjunctivae, EOM and lids are normal. Pupils are equal, round, and reactive to light.  Neck: Normal range of motion. Neck supple. No tracheal deviation present. No mass present.  Cardiovascular: Normal rate, regular rhythm and normal heart sounds.  Exam reveals  no gallop.   No murmur heard. Pulmonary/Chest: Effort normal and breath sounds normal. No stridor. No respiratory distress. She has no decreased breath sounds. She has no wheezes. She has no rhonchi. She has no rales.  Abdominal: Soft. Normal appearance and bowel sounds are normal. She exhibits no distension. There is no tenderness. There is no rebound and no CVA tenderness.  Musculoskeletal: Normal range of motion. She exhibits no edema and no tenderness.  Neurological: She is alert and oriented to person, place, and time. She has normal strength. No cranial nerve deficit or sensory deficit. GCS eye subscore is 4. GCS verbal subscore is 5. GCS motor subscore is 6.  Skin: Skin is warm and dry. No abrasion and no rash noted.  Psychiatric: She has a normal mood and affect. Her speech is normal and behavior is normal.    ED Course  Procedures (including critical care time)  Labs Reviewed - No data to display No results found.   1. Nosebleed       MDM  No active bleeding now -- repeat bp stable, pt offered nasal packing and has deferred        Toy Baker, MD 02/15/13 [phone]

## 2013-02-15 NOTE — ED Notes (Signed)
AVW:[id number]<XB> Expected date:<BR> Expected time:<BR> Means of arrival:<BR> Comments:<BR> ems- elderly, nose bleed x2 today

## 2013-02-16 ENCOUNTER — Other Ambulatory Visit (HOSPITAL_COMMUNITY): Payer: Self-pay | Admitting: Interventional Radiology

## 2013-02-16 DIAGNOSIS — C220 Liver cell carcinoma: Secondary | ICD-10-CM

## 2013-03-17 ENCOUNTER — Encounter (HOSPITAL_COMMUNITY): Payer: Self-pay

## 2013-03-17 ENCOUNTER — Ambulatory Visit
Admission: RE | Admit: 2013-03-17 | Discharge: 2013-03-17 | Disposition: A | Discharge status: Home / Self Care | Payer: Medicare Other | Source: Ambulatory Visit | Attending: Radiology | Admitting: Radiology

## 2013-03-17 ENCOUNTER — Ambulatory Visit (HOSPITAL_COMMUNITY)
Admission: RE | Admit: 2013-03-17 | Discharge: 2013-03-17 | Disposition: A | Discharge status: Home / Self Care | Payer: Medicare Other | Source: Ambulatory Visit | Attending: Interventional Radiology | Admitting: Interventional Radiology

## 2013-03-17 ENCOUNTER — Inpatient Hospital Stay: Admission: RE | Admit: 2013-03-17 | Payer: Medicare Other | Source: Ambulatory Visit

## 2013-03-17 DIAGNOSIS — I809 Phlebitis and thrombophlebitis of unspecified site: Secondary | ICD-10-CM

## 2013-03-17 DIAGNOSIS — C220 Liver cell carcinoma: Secondary | ICD-10-CM

## 2013-03-17 DIAGNOSIS — C228 Malignant neoplasm of liver, primary, unspecified as to type: Secondary | ICD-10-CM | POA: Insufficient documentation

## 2013-03-17 DIAGNOSIS — M47814 Spondylosis without myelopathy or radiculopathy, thoracic region: Secondary | ICD-10-CM | POA: Insufficient documentation

## 2013-03-17 DIAGNOSIS — I2510 Atherosclerotic heart disease of native coronary artery without angina pectoris: Secondary | ICD-10-CM

## 2013-03-17 DIAGNOSIS — H9190 Unspecified hearing loss, unspecified ear: Secondary | ICD-10-CM

## 2013-03-17 DIAGNOSIS — I10 Essential (primary) hypertension: Secondary | ICD-10-CM

## 2013-03-17 DIAGNOSIS — K7460 Unspecified cirrhosis of liver: Secondary | ICD-10-CM

## 2013-03-17 DIAGNOSIS — K920 Hematemesis: Secondary | ICD-10-CM

## 2013-03-17 DIAGNOSIS — K253 Acute gastric ulcer without hemorrhage or perforation: Secondary | ICD-10-CM

## 2013-03-17 DIAGNOSIS — K921 Melena: Secondary | ICD-10-CM

## 2013-03-17 DIAGNOSIS — E440 Moderate protein-calorie malnutrition: Secondary | ICD-10-CM

## 2013-03-17 DIAGNOSIS — K5520 Angiodysplasia of colon without hemorrhage: Secondary | ICD-10-CM

## 2013-03-17 DIAGNOSIS — D696 Thrombocytopenia, unspecified: Secondary | ICD-10-CM

## 2013-03-17 DIAGNOSIS — R160 Hepatomegaly, not elsewhere classified: Secondary | ICD-10-CM

## 2013-03-17 DIAGNOSIS — K449 Diaphragmatic hernia without obstruction or gangrene: Secondary | ICD-10-CM | POA: Insufficient documentation

## 2013-03-17 DIAGNOSIS — E119 Type 2 diabetes mellitus without complications: Secondary | ICD-10-CM

## 2013-03-17 DIAGNOSIS — R5383 Other fatigue: Secondary | ICD-10-CM

## 2013-03-17 DIAGNOSIS — H3530 Unspecified macular degeneration: Secondary | ICD-10-CM

## 2013-03-17 DIAGNOSIS — K922 Gastrointestinal hemorrhage, unspecified: Secondary | ICD-10-CM

## 2013-03-17 DIAGNOSIS — K8020 Calculus of gallbladder without cholecystitis without obstruction: Secondary | ICD-10-CM | POA: Insufficient documentation

## 2013-03-17 DIAGNOSIS — D509 Iron deficiency anemia, unspecified: Secondary | ICD-10-CM

## 2013-03-17 DIAGNOSIS — J90 Pleural effusion, not elsewhere classified: Secondary | ICD-10-CM

## 2013-03-17 DIAGNOSIS — E785 Hyperlipidemia, unspecified: Secondary | ICD-10-CM

## 2013-03-17 DIAGNOSIS — K766 Portal hypertension: Secondary | ICD-10-CM

## 2013-03-17 DIAGNOSIS — G459 Transient cerebral ischemic attack, unspecified: Secondary | ICD-10-CM

## 2013-03-17 HISTORY — DX: Malignant (primary) neoplasm, unspecified: C80.1

## 2013-03-17 MED ORDER — IOHEXOL 300 MG/ML  SOLN
100.0000 mL | Freq: Once | INTRAMUSCULAR | Status: AC | PRN
  Administered 2013-03-17: 100 mL via INTRAVENOUS

## 2013-03-17 NOTE — Progress Notes (Signed)
Appetite:  Good.  Denies nausea, vomiting, or diarrhea.  Occasionally will experience Right flank discomfort "feels like a pulling sensation".  Does not require pain meds.  Fatigues easily.  Able to complete ADL's with rest intervals.  Saraih Lorton Carmell Austria, RN 03/17/2013 11:20 AM

## 2013-04-28 ENCOUNTER — Other Ambulatory Visit: Payer: Self-pay

## 2013-05-06 ENCOUNTER — Other Ambulatory Visit (HOSPITAL_COMMUNITY): Payer: Self-pay | Admitting: Interventional Radiology

## 2013-05-06 DIAGNOSIS — C220 Liver cell carcinoma: Secondary | ICD-10-CM

## 2013-05-18 ENCOUNTER — Other Ambulatory Visit: Payer: Self-pay | Admitting: Emergency Medicine

## 2013-05-18 DIAGNOSIS — C220 Liver cell carcinoma: Secondary | ICD-10-CM

## 2013-06-09 ENCOUNTER — Encounter (HOSPITAL_COMMUNITY): Payer: Self-pay

## 2013-06-09 ENCOUNTER — Ambulatory Visit (HOSPITAL_COMMUNITY)
Admission: RE | Admit: 2013-06-09 | Discharge: 2013-06-09 | Disposition: A | Discharge status: Home / Self Care | Payer: Medicare Other | Source: Ambulatory Visit | Attending: Interventional Radiology | Admitting: Interventional Radiology

## 2013-06-09 ENCOUNTER — Ambulatory Visit
Admission: RE | Admit: 2013-06-09 | Discharge: 2013-06-09 | Disposition: A | Discharge status: Home / Self Care | Payer: Medicare Other | Source: Ambulatory Visit | Attending: Interventional Radiology | Admitting: Interventional Radiology

## 2013-06-09 VITALS — BP 120/66 | HR 69 | Temp 97.7°F | Resp 15 | Ht 66.0 in | Wt 184.0 lb

## 2013-06-09 DIAGNOSIS — C220 Liver cell carcinoma: Secondary | ICD-10-CM

## 2013-06-09 DIAGNOSIS — K449 Diaphragmatic hernia without obstruction or gangrene: Secondary | ICD-10-CM | POA: Insufficient documentation

## 2013-06-09 DIAGNOSIS — R109 Unspecified abdominal pain: Secondary | ICD-10-CM | POA: Insufficient documentation

## 2013-06-09 DIAGNOSIS — R197 Diarrhea, unspecified: Secondary | ICD-10-CM | POA: Insufficient documentation

## 2013-06-09 DIAGNOSIS — I708 Atherosclerosis of other arteries: Secondary | ICD-10-CM | POA: Insufficient documentation

## 2013-06-09 DIAGNOSIS — K7460 Unspecified cirrhosis of liver: Secondary | ICD-10-CM | POA: Insufficient documentation

## 2013-06-09 DIAGNOSIS — I8500 Esophageal varices without bleeding: Secondary | ICD-10-CM | POA: Insufficient documentation

## 2013-06-09 DIAGNOSIS — C228 Malignant neoplasm of liver, primary, unspecified as to type: Secondary | ICD-10-CM | POA: Insufficient documentation

## 2013-06-09 DIAGNOSIS — K766 Portal hypertension: Secondary | ICD-10-CM | POA: Insufficient documentation

## 2013-06-09 MED ORDER — IOHEXOL 300 MG/ML  SOLN
100.0000 mL | Freq: Once | INTRAMUSCULAR | Status: AC | PRN
  Administered 2013-06-09: 100 mL via INTRAVENOUS

## 2013-06-09 NOTE — Progress Notes (Signed)
Appetite:  Good.    Denies nausea, vomiting, diarrhea or bloating associated with procedure.  Denies pain associated with procedure.    Sleeping well.  Has been seeing Dr Oneta Rack for bilateral edema of lower extremities.  Rx:  Bumetanide 2 mg tabs 1 1/2 po QD.    Jenai Scaletta Carmell Austria, RN 06/09/2013 3:43 PM

## 2013-06-14 ENCOUNTER — Encounter: Payer: Self-pay | Admitting: Vascular Surgery

## 2013-06-14 ENCOUNTER — Other Ambulatory Visit: Payer: Self-pay | Admitting: *Deleted

## 2013-06-14 DIAGNOSIS — I83893 Varicose veins of bilateral lower extremities with other complications: Secondary | ICD-10-CM

## 2013-06-22 ENCOUNTER — Telehealth: Payer: Self-pay

## 2013-06-22 NOTE — Telephone Encounter (Signed)
Phone call from pt.  States has appt. 07/19/13, and requesting to have appt. moved up.  Reports she has swelling in both feet, up to ankles bilaterally, with left > than right. Reports "2 small blisters and a dime-size ulcer on top of the left foot."  States the ulcer burns at times.  Reports some redness in tissue around ulcer, and sm. amt. bloody drainage.    Advised pt. will contact her re: possible earlier appt.

## 2013-06-23 ENCOUNTER — Encounter (HOSPITAL_COMMUNITY): Payer: Medicare Other

## 2013-06-23 ENCOUNTER — Telehealth: Payer: Self-pay | Admitting: Vascular Surgery

## 2013-06-23 ENCOUNTER — Emergency Department (INDEPENDENT_AMBULATORY_CARE_PROVIDER_SITE_OTHER)
Admission: EM | Admit: 2013-06-23 | Discharge: 2013-06-23 | Disposition: A | Discharge status: Home / Self Care | Payer: Medicare Other | Source: Home / Self Care | Attending: Emergency Medicine | Admitting: Emergency Medicine

## 2013-06-23 ENCOUNTER — Encounter: Payer: Medicare Other | Admitting: Vascular Surgery

## 2013-06-23 ENCOUNTER — Encounter (HOSPITAL_COMMUNITY): Payer: Self-pay

## 2013-06-23 DIAGNOSIS — S81802A Unspecified open wound, left lower leg, initial encounter: Secondary | ICD-10-CM

## 2013-06-23 DIAGNOSIS — S81009A Unspecified open wound, unspecified knee, initial encounter: Secondary | ICD-10-CM

## 2013-06-23 NOTE — ED Notes (Addendum)
States she sustained injury to lower left leg in 6-14 or 7-14 when she was late for dental appointment. C/o she has not healed in all this time, and did not want to wait anymore to see a doctor for this problem. Pronounced varicosities bilateral lower legs on inspection w wound to left ankle. No active bleeding. Reportedly has an appointment to see vein doctor later this month

## 2013-06-23 NOTE — Telephone Encounter (Signed)
Spoke with pt. Told her she is on a wait list (ok per Carol/Liz) Pt verbalized understanding - kf

## 2013-06-23 NOTE — ED Provider Notes (Signed)
Medical screening examination/treatment/procedure(s) were performed by non-physician practitioner and as supervising physician I was immediately available for consultation/collaboration.  Leslee Home, M.D.  Reuben Likes, MD 06/23/13 [phone]

## 2013-06-23 NOTE — ED Provider Notes (Signed)
CSN: [id number]     Arrival date & time 06/23/13  1124 History   None    Chief Complaint  Patient presents with  . Wound Check   (Consider location/radiation/quality/duration/timing/severity/associated sxs/prior Treatment) HPI Comments: Pt with severe varicose veins, swelling in BLE. Shoes rubbed an area over a varicose vein in L foot in July, area will not heal. Has appt with vascular surgeon at end of October, but doesn't want to wait that long for help.   Patient is a 79 y.o. female presenting with wound check. The history is provided by the patient.  Wound Check This is a chronic problem. Episode onset: 3 months ago. The problem occurs constantly. The problem has not changed since onset.Nothing aggravates the symptoms. Nothing relieves the symptoms. Treatments tried: neosporin. The treatment provided no relief.    Past Medical History  Diagnosis Date  . Anemia, iron deficiency 08/07/2011  . Angiodysplasia of intestinal tract 08/07/2011  . Hypertension   . Arthritis   . Hx of gastroesophageal reflux (GERD)   . Diverticulitis   . Phlebitis   . Macular degeneration   . TIA (transient ischemic attack)   . MI (myocardial infarction)   . Hiatal hernia   . AVM (arteriovenous malformation) of colon with hemorrhage   . Varicose veins of legs   . Cataract   . GERD (gastroesophageal reflux disease)   . DJD (degenerative joint disease)   . Diabetes mellitus     borderline  . History of blood transfusion   . PONV (postoperative nausea and vomiting)   . hepatocellar ca dx'd 08/2012  . Leg pain   . Edema leg    Past Surgical History  Procedure Laterality Date  . 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 Abdominal hysterectomy    . Hemorrhoid surgery    . Esophagogastroduodenoscopy  08/30/2012    Procedure: ESOPHAGOGASTRODUODENOSCOPY (EGD);  Surgeon: [name], MD;  Location: [facility];  Service: Endoscopy;  Laterality: N/A;   Family History  Problem Relation Age  of Onset  . Colon cancer Sister   . Coronary artery disease Father    History  Substance Use Topics  . Smoking status: Former Smoker    Quit date: 07/23/1990  . Smokeless tobacco: Never Used  . Alcohol Use: No   OB History   Grav Para Term Preterm Abortions TAB SAB Ect Mult Living                 Review of Systems  Constitutional: Negative for fever and chills.  Skin: Positive for wound.    Allergies  Moxifloxacin; Sulfa antibiotics; Avelox; and Ciprofloxacin  Home Medications   Current Outpatient Rx  Name  Route  Sig  Dispense  Refill  . ALPRAZolam (XANAX) 1 MG tablet   Oral   Take 0.5 mg by mouth at bedtime as needed for sleep or anxiety.         Marland Kitchen atenolol (TENORMIN) 100 MG tablet   Oral   Take 100 mg by mouth daily before breakfast.          . bumetanide (BUMEX) 2 MG tablet   Oral   Take 2 mg by mouth daily as needed (edema).         . cholecalciferol (VITAMIN D) 1000 UNITS tablet   Oral   Take 1,000 Units by mouth daily.         . cyclobenzaprine (FLEXERIL) 10 MG tablet   Oral   Take 10  mg by mouth 2 (two) times daily as needed. MUSCLE SPASM.         Marland Kitchen NITROSTAT 0.4 MG SL tablet   Sublingual   Place 0.4 mg under the tongue every 5 (five) minutes as needed for chest pain.          Marland Kitchen omeprazole (PRILOSEC) 40 MG capsule   Oral   Take 40 mg by mouth 2 (two) times daily.          Marland Kitchen triamcinolone cream (KENALOG) 0.1 %   Topical   Apply 1 application topically 2 (two) times daily as needed. Eczema          BP 146/84  Pulse 70  Temp(Src) 98 F (36.7 C) (Oral)  Resp 16  SpO2 97% Physical Exam  Constitutional: She appears well-developed and well-nourished. No distress.  Cardiovascular:  Multiple, large varicose veins in BLE. 2+ pitting edema in BLE.   Musculoskeletal:       Feet:  Skin: Skin is warm and dry.  See msk exam    ED Course  Procedures (including critical care time) Labs Review Labs Reviewed - No data to  display Imaging Review No results found.  MDM   1. Non-healing wound of lower extremity, left, initial encounter     Discussed with Dr. Lorenz Coaster. Recommended double thickness tightly wrapped ace wraps and will refer to wound center.     Cathlyn Parsons, NP 06/23/13 [phone]

## 2013-06-28 ENCOUNTER — Encounter (HOSPITAL_BASED_OUTPATIENT_CLINIC_OR_DEPARTMENT_OTHER): Payer: Medicare Other | Attending: Plastic Surgery

## 2013-06-28 DIAGNOSIS — I8390 Asymptomatic varicose veins of unspecified lower extremity: Secondary | ICD-10-CM | POA: Insufficient documentation

## 2013-06-28 DIAGNOSIS — I10 Essential (primary) hypertension: Secondary | ICD-10-CM | POA: Insufficient documentation

## 2013-06-28 DIAGNOSIS — Z8673 Personal history of transient ischemic attack (TIA), and cerebral infarction without residual deficits: Secondary | ICD-10-CM | POA: Insufficient documentation

## 2013-06-28 DIAGNOSIS — E119 Type 2 diabetes mellitus without complications: Secondary | ICD-10-CM | POA: Insufficient documentation

## 2013-06-28 DIAGNOSIS — L97309 Non-pressure chronic ulcer of unspecified ankle with unspecified severity: Secondary | ICD-10-CM | POA: Insufficient documentation

## 2013-06-28 DIAGNOSIS — I872 Venous insufficiency (chronic) (peripheral): Secondary | ICD-10-CM | POA: Insufficient documentation

## 2013-06-28 DIAGNOSIS — I252 Old myocardial infarction: Secondary | ICD-10-CM | POA: Insufficient documentation

## 2013-06-28 LAB — GLUCOSE, CAPILLARY: Glucose-Capillary: 105 mg/dL — ABNORMAL HIGH (ref 70–99)

## 2013-06-29 NOTE — Progress Notes (Signed)
[facility]  NAMEKENLEIGH, TOBACK                  ACCOUNT NO.:  [id number]  MEDICAL RECORD NO.:  [id number]      DATE OF BIRTH:  [birth year]  PHYSICIAN:  Wayland Denis, DO       VISIT DATE:  06/28/2013                                  OFFICE VISIT   HISTORY:  The patient is an 79 year old female who is here for evaluation of her left lower extremity ankle venous insufficiency ulcer. She has severe venous insufficiency and does have an appointment scheduled with a vascular surgeon at the end of the month.  She has had vein stripping in the past.  She states she has had this for several weeks, and it does not seem to be getting any better.  PAST MEDICAL HISTORY:  Positive for anemia, angiodysplasia of intestinal tract, MI, hiatal hernia, AV malformation of colon, varicose veins, cataracts, degenerative joint disease, diabetes, liver cancer, cirrhosis of the liver, macular degeneration, phlebitis, diverticulitis, gastroesophageal reflux, arthritis, hypertension, and TIA.  PAST SURGICAL HISTORY:  Subclavian artery bypass graft, carotid artery, abdominal hysterectomy, hemorrhoid surgery, esophagogastroduodenoscopy, liver ablation for tumor.  MEDICATIONS:  Include Xanax, atenolol, vitamin D, Flexeril, Nitrostat, omeprazole, triamcinolone, bumetanide.  ALLERGIES:  Include sulfa, Avelox, Cipro, and moxifloxacin.  REVIEW OF SYSTEMS:  She has multiple medical conditions being managed by her primary care physician, and she has an appointment with vascular surgery for her vascular status.  SOCIAL HISTORY:  She does not smoke, and she has good family support. She is alert and oriented.  She seems to be aware of what is happening to her at the moment.  Her daughter does most of the talking.  PHYSICAL EXAMINATION:  NECK:  She does not have any cervical lymphadenopathy. LUNGS:  Her breathing is unlabored.  Her heart rate is regular. ABDOMEN:  Soft and  nontender. EXTREMITIES:  Her upper and lower extremities are regular.  Her lower extremities, present but weak arterial pulses.  She has severe varicose veins and varicosities.  The ulcers are small and located on the medial aspect of her left ankle.  Recommend multivitamin, vitamin C, zinc, elevation, compression stockings.  A script was given for this.  Definitely follow up with her vascular doctor to see if there is anything that can be done surgically and we will manage her conservatively until we know what vascular options she has.     Wayland Denis, DO     CS/MEDQ  D:  06/28/2013  T:  06/29/2013  Job:  [id number]

## 2013-07-12 NOTE — Progress Notes (Signed)
[facility]  [name], [name]                  ACCOUNT NO.:  [id number]  MEDICAL RECORD NO.:  [id number]      DATE OF BIRTH:  05-22-1928  PHYSICIAN:  Wayland Denis, DO       VISIT DATE:  07/12/2013                                  OFFICE VISIT   The patient is an 79 year old female who is here for evaluation and followup of her left medial ankle ulcer from very severe chronic venous insufficiency.  She has had the Profore Lite on this week and it actually looks like it is helped with the overall swelling and with the wound, which is 1.3 x 2.0 x 0.1.  It actually measures a little bit wider, but the depth is improving from visual exam.  There has been no change in her medications or social history.  She is alert and oriented.  She is cooperative, but quite negative about doing much of anything for herself at this time and she does not like wearing the compression.  I have encouraged her to get compression stockings so that we might be able to transition her.  She also has an appointment scheduled with Vascular to do some studies, so we are awaiting on that.  On exam, she is alert, oriented, cooperative, not in any acute distress. She is pleasant.  Her pupils are equal.  Her extraocular muscles are intact.  She does not have any trouble breathing and her heart rate is regular.  The wound is described above.  We will continue with the Profore Lite and encouraged her to get a compression stocking through which we gave her the prescription for, and we will see her back in a week.  She also needs to continue multivitamin, vitamin C, zinc, and elevation.     Wayland Denis, DO     CS/MEDQ  D:  07/12/2013  T:  07/12/2013  Job:  [id number]

## 2013-07-16 ENCOUNTER — Encounter: Payer: Self-pay | Admitting: Vascular Surgery

## 2013-07-19 ENCOUNTER — Encounter (HOSPITAL_COMMUNITY): Payer: Medicare Other

## 2013-07-19 ENCOUNTER — Ambulatory Visit (INDEPENDENT_AMBULATORY_CARE_PROVIDER_SITE_OTHER): Payer: Medicare Other | Admitting: Vascular Surgery

## 2013-07-19 ENCOUNTER — Encounter: Payer: Medicare Other | Admitting: Vascular Surgery

## 2013-07-19 ENCOUNTER — Encounter: Payer: Self-pay | Admitting: Vascular Surgery

## 2013-07-19 ENCOUNTER — Ambulatory Visit (HOSPITAL_COMMUNITY)
Admission: RE | Admit: 2013-07-19 | Discharge: 2013-07-19 | Disposition: A | Discharge status: Home / Self Care | Payer: Medicare Other | Source: Ambulatory Visit | Attending: Vascular Surgery | Admitting: Vascular Surgery

## 2013-07-19 VITALS — BP 126/73 | HR 67 | Resp 16 | Ht 66.0 in | Wt 185.0 lb

## 2013-07-19 DIAGNOSIS — I83893 Varicose veins of bilateral lower extremities with other complications: Secondary | ICD-10-CM | POA: Insufficient documentation

## 2013-07-19 DIAGNOSIS — I83219 Varicose veins of right lower extremity with both ulcer of unspecified site and inflammation: Secondary | ICD-10-CM | POA: Insufficient documentation

## 2013-07-19 DIAGNOSIS — L97919 Non-pressure chronic ulcer of unspecified part of right lower leg with unspecified severity: Secondary | ICD-10-CM | POA: Insufficient documentation

## 2013-07-19 DIAGNOSIS — I83221 Varicose veins of left lower extremity with both ulcer of thigh and inflammation: Secondary | ICD-10-CM

## 2013-07-19 NOTE — Progress Notes (Signed)
Subjective:     Patient ID: [name], female   DOB: [birth year], 79 y.o.   MRN: [id number]  HPI this 79 year old female seen today for an episode of severe bleeding from a varicose vein ulceration in the left ankle which occurred yesterday and required EMS to take her to the hospital. She has a history of diffuse bilateral venous insufficiency and did require laser ablation of her left great saphenous vein in 2007 and of her right small saphenous vein in 2008. Her varicosities were improved for many years but began to worsen. She has had some aching and throbbing and edema in both legs and has been treated with diuretics. She has been going to the wound center for an ulcer in the left ankle for the last several weeks until the bleeding an episode which occurred yesterday. She has had no episodes of DVT or superficial thrombophlebitis has not been wearing an elastic compression stocking a regular basis because of the difficulty of putting these on according to her and her daughter. She doesn't elevate her legs at night. She is very fearful of further bleeding episodes.  Past Medical History  Diagnosis Date  . Anemia, iron deficiency 08/07/2011  . Angiodysplasia of intestinal tract 08/07/2011  . Hypertension   . Arthritis   . Hx of gastroesophageal reflux (GERD)   . Diverticulitis   . Phlebitis   . Macular degeneration   . TIA (transient ischemic attack)   . MI (myocardial infarction)   . Hiatal hernia   . AVM (arteriovenous malformation) of colon with hemorrhage   . Varicose veins of legs   . Cataract   . GERD (gastroesophageal reflux disease)   . DJD (degenerative joint disease)   . Diabetes mellitus     borderline  . History of blood transfusion   . PONV (postoperative nausea and vomiting)   . hepatocellar ca dx'd 08/2012  . Leg pain   . Edema leg     History  Substance Use Topics  . Smoking status: Former Smoker    Quit date: 07/23/1990  . Smokeless tobacco: Never Used  .  Alcohol Use: No    Family History  Problem Relation Age of Onset  . Colon cancer Sister   . Coronary artery disease Father     Allergies  Allergen Reactions  . Moxifloxacin   . Sulfa Antibiotics   . Avelox [Moxifloxacin Hcl In Nacl]   . Ciprofloxacin Other (See Comments)    Complains of sore mouth    Current outpatient prescriptions:ALPRAZolam (XANAX) 1 MG tablet, Take 0.5 mg by mouth at bedtime as needed for sleep or anxiety., Disp: , Rfl: ;  atenolol (TENORMIN) 100 MG tablet, Take 100 mg by mouth daily before breakfast. , Disp: , Rfl: ;  bumetanide (BUMEX) 2 MG tablet, Take 2 mg by mouth daily as needed (edema)., Disp: , Rfl: ;  cholecalciferol (VITAMIN D) 1000 UNITS tablet, Take 1,000 Units by mouth daily., Disp: , Rfl:  cyclobenzaprine (FLEXERIL) 10 MG tablet, Take 10 mg by mouth 2 (two) times daily as needed. MUSCLE SPASM., Disp: , Rfl: ;  NITROSTAT 0.4 MG SL tablet, Place 0.4 mg under the tongue every 5 (five) minutes as needed for chest pain. , Disp: , Rfl: ;  omeprazole (PRILOSEC) 40 MG capsule, Take 40 mg by mouth 2 (two) times daily. , Disp: , Rfl:  triamcinolone cream (KENALOG) 0.1 %, Apply 1 application topically 2 (two) times daily as needed. Eczema, Disp: , Rfl: ;  [DISCONTINUED]  fluvastatin XL (LESCOL XL) 80 MG 24 hr tablet, Take 80 mg by mouth daily.  , Disp: , Rfl:   BP 126/73  Pulse 67  Resp 16  Ht 5\' 6"  (1.676 m)  Wt 185 lb (83.915 kg)  BMI 29.87 kg/m2  Body mass index is 29.87 kg/(m^2).         Review of Systems denies chest pain but does have mild dyspnea on exertion. Some difficulty with ambulation. Somewhat unstable on feet at times. All other systems negative and a complete review of systems other than a recent history of the liver tumor which was successfully treated according to family.    Objective:   Physical Exam BP 126/73  Pulse 67  Resp 16  Ht 5\' 6"  (1.676 m)  Wt 185 lb (83.915 kg)  BMI 29.87 kg/m2  Gen.-alert and oriented x3 in no  apparent distress HEENT normal for age Lungs no rhonchi or wheezing Cardiovascular regular rhythm no murmurs carotid pulses 3+ palpable no bruits audible Abdomen soft nontender no palpable masses Musculoskeletal free of  major deformities Skin clear -no rashes Neurologic normal Lower extremities 3+ femoral and dorsalis pedis pulses palpable  Has bulging varicosities bilaterally in the medial thigh and medial calf pretibial region with hyperpigmentation distally. Left ankle has an extensive ulceration over a 3-4 cm in diameter area with one area of bleeding anterior to the medial malleolus. Right leg is free of any ulceration or bleeding. Both legs have chronic 1+ to 2+ edema.  Today I ordered bilateral venous duplex exam which I reviewed and interpreted. The left symptomatic leg has gross reflux in the left great saphenous system the vein becomes quite tortuous in the mid to distal thigh and then is once again patent below the knee. There is also gross reflux in the left small saphenous vein and there is also an incompetent perforator the lower third of the left leg. There is deep reflux bilaterally. Right leg has gross reflux proximally in the great saphenous system and also some in the right small saphenous system with some partial thrombus.       Assessment:     Bilateral venous insufficiency-recurrent with symptomatic varicose veins left leg with history of stasis ulcer which is not improved and severe bleeding episode yesterday requiring a trip to hospital with EMS-gross reflux bilateral great saphenous systems in left small saphenous system    Plan:     Patient needs #1 laser ablation left great saphenous vein followed by a #2 laser ablation left small saphenous vein. Hopefully this will prevent further bleeding she will need chronic elastic compression stockings below the knee following these procedures. We will proceed with precertification to get this performed on an urgent basis  because of her severe bleeding episode yesterday

## 2013-07-23 ENCOUNTER — Encounter: Payer: Self-pay | Admitting: Vascular Surgery

## 2013-07-26 ENCOUNTER — Ambulatory Visit (INDEPENDENT_AMBULATORY_CARE_PROVIDER_SITE_OTHER): Payer: Medicare Other | Admitting: Vascular Surgery

## 2013-07-26 ENCOUNTER — Encounter: Payer: Self-pay | Admitting: Vascular Surgery

## 2013-07-26 VITALS — BP 137/83 | HR 71 | Resp 16 | Ht 66.0 in | Wt 185.0 lb

## 2013-07-26 DIAGNOSIS — I83893 Varicose veins of bilateral lower extremities with other complications: Secondary | ICD-10-CM

## 2013-07-26 NOTE — Progress Notes (Signed)
   Laser Ablation Procedure      Date: 07/26/2013    [name] DOB:January 12, 1928  Consent signed: Yes  Surgeon:J.D. Hart [place]  Procedure: Laser Ablation: left Greater Saphenous Vein  BP 137/83  Pulse 71  Resp 16  Ht 5\' 6"  (1.676 m)  Wt 185 lb (83.915 kg)  BMI 29.87 kg/m2  Start time: 9:20   End time: 10:00  Tumescent Anesthesia: 200 cc 0.9% NaCl with 50 cc Lidocaine HCL with 1% Epi and 15 cc 8.4% NaHCO3  Local Anesthesia: 5 cc Lidocaine HCL and NaHCO3 (ratio 2:1)  Pulsed mode 15 watts, 500 ms delay, 1.0 duration Total energy: 1165, total pulses: 78, total time: 1:17    Patient tolerated procedure well: Yes  Notes:   Description of Procedure:  After marking the course of the saphenous vein and the secondary varicosities in the standing position, the patient was placed on the operating table in the supine position, and the left leg was prepped and draped in sterile fashion. Local anesthetic was administered, and under ultrasound guidance the saphenous vein was accessed with a micro needle and guide wire; then the micro puncture sheath was placed. A guide wire was inserted to the saphenofemoral junction, followed by a 5 french sheath.  The position of the sheath and then the laser fiber below the junction was confirmed using the ultrasound and visualization of the aiming beam.  Tumescent anesthesia was administered along the course of the saphenous vein using ultrasound guidance. Protective laser glasses were placed on the patient, and the laser was fired at 15 watt pulsed mode advancing 1-2 mm per sec.  For a total of 1165 joules.  A steri strip was applied to the puncture site.    ABD pads and thigh high compression stockings were applied.  Ace wrap bandages were applied over the phlebectomy sites and at the top of the saphenofemoral junction.  Blood loss was less than 15 cc.  The patient ambulated out of the operating room having tolerated the procedure well.

## 2013-07-26 NOTE — Progress Notes (Signed)
Subjective:     Patient ID: [name], female   DOB: [birth year], 79 y.o.   MRN: [id number]  HPI 79 year old female had laser ablation left great saphenous vein from the mid thigh to the saphenofemoral junction performed under local tumescent anesthesia for recurrent varicosities with recent episode of bleeding from varix in the left ankle. I attempted to enter a tortuous distal great saphenous vein in the mid calf which I did enter but the wire would not traverse the vein proximally because of tortuosity. Patient tolerated the procedure well.   Review of Systems     Objective:   Physical Exam BP 137/83  Pulse 71  Resp 16  Ht 5\' 6"  (1.676 m)  Wt 185 lb (83.915 kg)  BMI 29.87 kg/m2       Assessment:     Well-tolerated laser ablation left great saphenous vein from mid thigh to saphenofemoral junction for severe venous hypertension with recurrent varicosities in recent bleeding    Plan:     Return in one week for venous duplex exam to confirm closure left great saphenous vein from mid thigh the saphenofemoral junction. Patient will then be scheduled for laser ablation left small saphenous vein to hopefully prevent further bleeding episodes

## 2013-07-27 ENCOUNTER — Telehealth: Payer: Self-pay | Admitting: *Deleted

## 2013-07-27 NOTE — Telephone Encounter (Signed)
Pt says she is doing well, following all instructions, not in much pain. Reminded her of her fu appt next week.

## 2013-07-28 ENCOUNTER — Other Ambulatory Visit: Payer: Self-pay | Admitting: *Deleted

## 2013-07-28 DIAGNOSIS — I83893 Varicose veins of bilateral lower extremities with other complications: Secondary | ICD-10-CM

## 2013-07-29 ENCOUNTER — Other Ambulatory Visit: Payer: Self-pay

## 2013-08-02 ENCOUNTER — Encounter: Payer: Self-pay | Admitting: Vascular Surgery

## 2013-08-03 ENCOUNTER — Other Ambulatory Visit: Payer: Self-pay | Admitting: Vascular Surgery

## 2013-08-03 ENCOUNTER — Encounter: Payer: Self-pay | Admitting: Vascular Surgery

## 2013-08-03 ENCOUNTER — Ambulatory Visit (INDEPENDENT_AMBULATORY_CARE_PROVIDER_SITE_OTHER): Payer: Medicare Other | Admitting: Vascular Surgery

## 2013-08-03 ENCOUNTER — Ambulatory Visit (HOSPITAL_COMMUNITY)
Admission: RE | Admit: 2013-08-03 | Discharge: 2013-08-03 | Disposition: A | Discharge status: Home / Self Care | Payer: Medicare Other | Source: Ambulatory Visit | Attending: Vascular Surgery | Admitting: Vascular Surgery

## 2013-08-03 VITALS — BP 141/72 | HR 72 | Resp 16 | Ht 66.0 in | Wt 182.0 lb

## 2013-08-03 DIAGNOSIS — I83893 Varicose veins of bilateral lower extremities with other complications: Secondary | ICD-10-CM

## 2013-08-03 NOTE — Progress Notes (Signed)
Subjective:     Patient ID: [name], female   DOB: 1927/10/26, 79 y.o.   MRN: [id number]  HPI this 79 year old female returns 1 week post laser ablation left great saphenous vein for venous hypertension with stasis ulcer and history of bleeding. She has had some mild-to-moderate discomfort along the course of the left great saphenous vein and states that this is rapidly improving. She notices a significant difference between the edema below the knee.  Past Medical History  Diagnosis Date  . Anemia, iron deficiency 08/07/2011  . Angiodysplasia of intestinal tract 08/07/2011  . Hypertension   . Arthritis   . Hx of gastroesophageal reflux (GERD)   . Diverticulitis   . Phlebitis   . Macular degeneration   . TIA (transient ischemic attack)   . MI (myocardial infarction)   . Hiatal hernia   . AVM (arteriovenous malformation) of colon with hemorrhage   . Varicose veins of legs   . Cataract   . GERD (gastroesophageal reflux disease)   . DJD (degenerative joint disease)   . Diabetes mellitus     borderline  . History of blood transfusion   . PONV (postoperative nausea and vomiting)   . hepatocellar ca dx'd 08/2012  . Leg pain   . Edema leg     History  Substance Use Topics  . Smoking status: Former Smoker    Quit date: 07/23/1990  . Smokeless tobacco: Never Used  . Alcohol Use: No    Family History  Problem Relation Age of Onset  . Colon cancer Sister   . Coronary artery disease Father     Allergies  Allergen Reactions  . Moxifloxacin   . Sulfa Antibiotics   . Avelox [Moxifloxacin Hcl In Nacl]   . Ciprofloxacin Other (See Comments)    Complains of sore mouth    Current outpatient prescriptions:ALPRAZolam (XANAX) 1 MG tablet, Take 0.5 mg by mouth at bedtime as needed for sleep or anxiety., Disp: , Rfl: ;  atenolol (TENORMIN) 100 MG tablet, Take 100 mg by mouth daily before breakfast. , Disp: , Rfl: ;  bumetanide (BUMEX) 2 MG tablet, Take 2 mg by mouth daily as needed  (edema)., Disp: , Rfl: ;  cholecalciferol (VITAMIN D) 1000 UNITS tablet, Take 1,000 Units by mouth daily., Disp: , Rfl:  cyclobenzaprine (FLEXERIL) 10 MG tablet, Take 10 mg by mouth 2 (two) times daily as needed. MUSCLE SPASM., Disp: , Rfl: ;  NITROSTAT 0.4 MG SL tablet, Place 0.4 mg under the tongue every 5 (five) minutes as needed for chest pain. , Disp: , Rfl: ;  omeprazole (PRILOSEC) 40 MG capsule, Take 40 mg by mouth 2 (two) times daily. , Disp: , Rfl:  triamcinolone cream (KENALOG) 0.1 %, Apply 1 application topically 2 (two) times daily as needed. Eczema, Disp: , Rfl: ;  [DISCONTINUED] fluvastatin XL (LESCOL XL) 80 MG 24 hr tablet, Take 80 mg by mouth daily.  , Disp: , Rfl:   BP 141/72  Pulse 72  Resp 16  Ht 5\' 6"  (1.676 m)  Wt 182 lb (82.555 kg)  BMI 29.39 kg/m2  Body mass index is 29.39 kg/(m^2).           Review of Systems denies chest pain, dyspnea on exertion, PND, orthopnea, hemoptysis. Has symptoms related to gastroesophageal reflux.     Objective:   Physical Exam BP 141/72  Pulse 72  Resp 16  Ht 5\' 6"  (1.676 m)  Wt 182 lb (82.555 kg)  BMI 29.39 kg/m2  General well-developed well-nourished female in no apparent stress alert and oriented x3 Lungs no rhonchi or wheezing Left leg with mild tenderness along the course of great saphenous vein. Unna boot was removed and there are 2 small ulcerations near the medial malleolus about 4 mm in diameter with no active bleeding and much less edema below the knee than previously. Varicosities are less tense area 3+ dorsalis pedis pulse palpable.  Today I ordered a venous duplex exam of the left leg which are reviewed and interpreted. Left great saphenous vein is totally closed from the distal thigh to near the saphenofemoral junction. There is no DVT.     Assessment:     Successful laser ablation left great saphenous vein for stasis ulcers with bleeding in left ankle    Plan:     Plan laser ablation left small saphenous  vein next week to complete treatment regimen of left leg

## 2013-08-06 ENCOUNTER — Encounter: Payer: Self-pay | Admitting: Vascular Surgery

## 2013-08-09 ENCOUNTER — Ambulatory Visit (INDEPENDENT_AMBULATORY_CARE_PROVIDER_SITE_OTHER): Payer: Medicare Other | Admitting: Vascular Surgery

## 2013-08-09 ENCOUNTER — Encounter: Payer: Self-pay | Admitting: Vascular Surgery

## 2013-08-09 VITALS — BP 153/83 | HR 72 | Resp 16 | Ht 66.0 in | Wt 182.0 lb

## 2013-08-09 DIAGNOSIS — I83219 Varicose veins of right lower extremity with both ulcer of unspecified site and inflammation: Secondary | ICD-10-CM

## 2013-08-09 DIAGNOSIS — I83221 Varicose veins of left lower extremity with both ulcer of thigh and inflammation: Secondary | ICD-10-CM

## 2013-08-09 NOTE — Progress Notes (Signed)
   Laser Ablation Procedure      Date: 08/09/2013    [name] DOB:September 03, 1928  Consent signed: Yes  Surgeon:J.D. Hart [place]  Procedure: Laser Ablation: left Small Saphenous Vein  BP 153/83  Pulse 72  Resp 16  Ht 5\' 6"  (1.676 m)  Wt 182 lb (82.555 kg)  BMI 29.39 kg/m2  Start time: 3:10   End time: 4:10  Tumescent Anesthesia: 200 cc 0.9% NaCl with 50 cc Lidocaine HCL with 1% Epi and 15 cc 8.4% NaHCO3  Local Anesthesia: 4 cc Lidocaine HCL and NaHCO3 (ratio 2:1) Pulsed mode: 15 watts, delay, 1.0 duration Total energy: 1047, total pulses: 70, total time: 1:10     Patient tolerated procedure well: Yes  Notes:   Description of Procedure:  After marking the course of the saphenous vein and the secondary varicosities in the standing position, the patient was placed on the operating table in the lateral position, and the left leg was prepped and draped in sterile fashion. Local anesthetic was administered, and under ultrasound guidance the saphenous vein was accessed with a micro needle and guide wire; then the micro puncture sheath was placed. A guide wire was inserted to the saphenopopliteal junction, followed by a 5 french sheath.  The position of the sheath and then the laser fiber below the junction was confirmed using the ultrasound and visualization of the aiming beam.  Tumescent anesthesia was administered along the course of the saphenous vein using ultrasound guidance. Protective laser glasses were placed on the patient, and the laser was fired at 15 watts pulsed mode.  For a total of 1047 joules.  A steri strip was applied to the puncture site.    ABD pads and thigh high compression stockings were applied.  Ace wrap bandages were applied over the phlebectomy sites and at the top of the saphenopopliteal junction.  Blood loss was less than 15 cc.  The patient ambulated out of the operating room having tolerated the procedure well.

## 2013-08-09 NOTE — Progress Notes (Signed)
Subjective:     Patient ID: [name], female   DOB: Jan 07, 1928, 79 y.o.   MRN: [id number]  HPI this 79 year old female had laser ablation of the left small saphenous vein in the distal calf to near the saphenous popliteal junction performed under local tumescent anesthesia for venous hypertension with venous stasis ulcer . She had previously undergone laser ablation of the left great saphenous vein. She tolerated the procedure well.  Review of Systems     Objective:   Physical Exam BP 153/83  Pulse 72  Resp 16  Ht 5\' 6"  (1.676 m)  Wt 182 lb (82.555 kg)  BMI 29.39 kg/m2       Assessment:     Well-tolerated laser ablation left small saphenous vein performed under local tumescent anesthesia for venous hypertension with stasis ulcer    Plan:     Return in one week for venous duplex exam to confirm closure left small saphenous vein and this will complete treatment regimen. She will then return to wound center for further care

## 2013-08-10 ENCOUNTER — Ambulatory Visit (INDEPENDENT_AMBULATORY_CARE_PROVIDER_SITE_OTHER): Payer: Medicare Other | Admitting: *Deleted

## 2013-08-10 DIAGNOSIS — I83893 Varicose veins of bilateral lower extremities with other complications: Secondary | ICD-10-CM

## 2013-08-10 NOTE — Progress Notes (Signed)
Pt's daughter called in concerned because it looked like there was some bleeding through the unna boot. They live close by so I asked her to come in . There was serous drainage from the insertion site for the laser ablation performed yesterday. No frank blood. Upon removal of the unna boot, her ulcers are looking much better with no drainage. Decided to place silvadene on the area and to apply a knee high compression stocking (Large). Wrapped an ace wrap over the leg for compression. Will see her next week for her fu visit.

## 2013-08-16 ENCOUNTER — Encounter: Payer: Self-pay | Admitting: Vascular Surgery

## 2013-08-17 ENCOUNTER — Other Ambulatory Visit: Payer: Self-pay | Admitting: Vascular Surgery

## 2013-08-17 ENCOUNTER — Ambulatory Visit (HOSPITAL_COMMUNITY)
Admission: RE | Admit: 2013-08-17 | Discharge: 2013-08-17 | Disposition: A | Discharge status: Home / Self Care | Payer: Medicare Other | Source: Ambulatory Visit | Attending: Vascular Surgery | Admitting: Vascular Surgery

## 2013-08-17 ENCOUNTER — Ambulatory Visit (INDEPENDENT_AMBULATORY_CARE_PROVIDER_SITE_OTHER): Payer: Medicare Other | Admitting: Vascular Surgery

## 2013-08-17 ENCOUNTER — Encounter: Payer: Self-pay | Admitting: Vascular Surgery

## 2013-08-17 VITALS — BP 153/63 | HR 73 | Resp 16 | Ht 66.0 in | Wt 182.0 lb

## 2013-08-17 DIAGNOSIS — I83893 Varicose veins of bilateral lower extremities with other complications: Secondary | ICD-10-CM

## 2013-08-17 NOTE — Progress Notes (Signed)
Subjective:     Patient ID: [name], female   DOB: June 04, 1928, 79 y.o.   MRN: [id number]  HPI this 79 year old female returns 1 week post laser ablation left small saphenous vein for venous stasis ulcer. She had previously had laser ablation of left great saphenous vein. The ulcer is now completely healed and she states that the swelling has diminished in the lower leg. She does have moderate tenderness where the ablation was performed and a small saphenous vein posteriorly.  Past Medical History  Diagnosis Date  . Anemia, iron deficiency 08/07/2011  . Angiodysplasia of intestinal tract 08/07/2011  . Hypertension   . Arthritis   . Hx of gastroesophageal reflux (GERD)   . Diverticulitis   . Phlebitis   . Macular degeneration   . TIA (transient ischemic attack)   . MI (myocardial infarction)   . Hiatal hernia   . AVM (arteriovenous malformation) of colon with hemorrhage   . Varicose veins of legs   . Cataract   . GERD (gastroesophageal reflux disease)   . DJD (degenerative joint disease)   . Diabetes mellitus     borderline  . History of blood transfusion   . PONV (postoperative nausea and vomiting)   . hepatocellar ca dx'd 08/2012  . Leg pain   . Edema leg     History  Substance Use Topics  . Smoking status: Former Smoker    Quit date: 07/23/1990  . Smokeless tobacco: Never Used  . Alcohol Use: No    Family History  Problem Relation Age of Onset  . Colon cancer Sister   . Coronary artery disease Father     Allergies  Allergen Reactions  . Moxifloxacin   . Sulfa Antibiotics   . Avelox [Moxifloxacin Hcl In Nacl]   . Ciprofloxacin Other (See Comments)    Complains of sore mouth    Current outpatient prescriptions:ALPRAZolam (XANAX) 1 MG tablet, Take 0.5 mg by mouth at bedtime as needed for sleep or anxiety., Disp: , Rfl: ;  atenolol (TENORMIN) 100 MG tablet, Take 100 mg by mouth daily before breakfast. , Disp: , Rfl: ;  bumetanide (BUMEX) 2 MG tablet, Take 2 mg  by mouth daily as needed (edema)., Disp: , Rfl: ;  cholecalciferol (VITAMIN D) 1000 UNITS tablet, Take 1,000 Units by mouth daily., Disp: , Rfl:  cyclobenzaprine (FLEXERIL) 10 MG tablet, Take 10 mg by mouth 2 (two) times daily as needed. MUSCLE SPASM., Disp: , Rfl: ;  NITROSTAT 0.4 MG SL tablet, Place 0.4 mg under the tongue every 5 (five) minutes as needed for chest pain. , Disp: , Rfl: ;  omeprazole (PRILOSEC) 40 MG capsule, Take 40 mg by mouth 2 (two) times daily. , Disp: , Rfl:  triamcinolone cream (KENALOG) 0.1 %, Apply 1 application topically 2 (two) times daily as needed. Eczema, Disp: , Rfl: ;  [DISCONTINUED] fluvastatin XL (LESCOL XL) 80 MG 24 hr tablet, Take 80 mg by mouth daily.  , Disp: , Rfl:   BP 153/63  Pulse 73  Resp 16  Ht 5\' 6"  (1.676 m)  Wt 182 lb (82.555 kg)  BMI 29.39 kg/m2  Body mass index is 29.39 kg/(m^2).           Review of Systems denies chest pain, dyspnea on exertion, hemoptysis    Objective:   Physical Exam BP 153/63  Pulse 73  Resp 16  Ht 5\' 6"  (1.676 m)  Wt 182 lb (82.555 kg)  BMI 29.39 kg/m2  General elderly  female no apparent stress alert and oriented x3 Lungs no rhonchi or wheezing Left leg with moderate tenderness along the course of small saphenous vein posteriorly. Ulceration medial lower leg has now healed completely. 1+ chronic edema with some varicosities in the medial calf. 3+ dorsalis pedis pulse palpable.  Today I ordered a venous duplex exam the left leg which are reviewed and interpreted. Both the great and small saphenous vein are closed and there is no DVT     Assessment:     Successful laser ablation left great and small saphenous vein with healing of venous stasis ulcer    Plan:     Continue long-leg elastic compression stockings for one week and then continue short leg elastic compression stocking 20-30 mm gradient on a chronic basis Elevate foot of bed 3 inches Return to see [place] on when necessary basis

## 2013-08-23 ENCOUNTER — Telehealth: Payer: Self-pay | Admitting: *Deleted

## 2013-08-23 NOTE — Telephone Encounter (Signed)
[name] is s/p endovenous laser ablation left small saphenous vein by Betti Cruz M.D. on 08-09-2013.  She was seen by Dr. Hart [place] on 08-17-2013 for follow up and had venous duplex on 08-17-2013 which showed the left greater and small saphenous veins were closed and no DVT was present.  [name] states that she is continuing to have pain in the left posterior calf (behind her knee).  She states the left leg pain is painful to touch and is not improving since her procedure.  She states she is unable to wear her compression hose because she has difficulty putting them on and taking them off and they roll down.  She also states she cannot take Ibuprofen due to liver issues.  Recommended using an ice pack to painful area as needed for pain, elevating left leg as much as possible, and wrapping her left leg with an Ace wrap (that she was sent home with). Recommended that she call [facility] if pain intensifies or if she experiences swelling in left ankle or foot.

## 2013-09-04 ENCOUNTER — Emergency Department (HOSPITAL_COMMUNITY): Payer: Medicare Other

## 2013-09-04 ENCOUNTER — Inpatient Hospital Stay (HOSPITAL_COMMUNITY)
Admission: EM | Admit: 2013-09-04 | Discharge: 2013-09-05 | DRG: 312 | Disposition: A | Discharge status: Home / Self Care | Payer: Medicare Other | Attending: Internal Medicine | Admitting: Internal Medicine

## 2013-09-04 ENCOUNTER — Encounter (HOSPITAL_COMMUNITY): Payer: Self-pay | Admitting: Emergency Medicine

## 2013-09-04 DIAGNOSIS — K922 Gastrointestinal hemorrhage, unspecified: Secondary | ICD-10-CM

## 2013-09-04 DIAGNOSIS — T448X5A Adverse effect of centrally-acting and adrenergic-neuron-blocking agents, initial encounter: Secondary | ICD-10-CM | POA: Diagnosis present

## 2013-09-04 DIAGNOSIS — E119 Type 2 diabetes mellitus without complications: Secondary | ICD-10-CM

## 2013-09-04 DIAGNOSIS — Z87891 Personal history of nicotine dependence: Secondary | ICD-10-CM

## 2013-09-04 DIAGNOSIS — R5383 Other fatigue: Secondary | ICD-10-CM

## 2013-09-04 DIAGNOSIS — K219 Gastro-esophageal reflux disease without esophagitis: Secondary | ICD-10-CM | POA: Diagnosis present

## 2013-09-04 DIAGNOSIS — Z8673 Personal history of transient ischemic attack (TIA), and cerebral infarction without residual deficits: Secondary | ICD-10-CM

## 2013-09-04 DIAGNOSIS — Z8249 Family history of ischemic heart disease and other diseases of the circulatory system: Secondary | ICD-10-CM

## 2013-09-04 DIAGNOSIS — K920 Hematemesis: Secondary | ICD-10-CM

## 2013-09-04 DIAGNOSIS — Z881 Allergy status to other antibiotic agents status: Secondary | ICD-10-CM

## 2013-09-04 DIAGNOSIS — G459 Transient cerebral ischemic attack, unspecified: Secondary | ICD-10-CM

## 2013-09-04 DIAGNOSIS — Z8672 Personal history of thrombophlebitis: Secondary | ICD-10-CM

## 2013-09-04 DIAGNOSIS — M1990 Unspecified osteoarthritis, unspecified site: Secondary | ICD-10-CM | POA: Diagnosis present

## 2013-09-04 DIAGNOSIS — R160 Hepatomegaly, not elsewhere classified: Secondary | ICD-10-CM

## 2013-09-04 DIAGNOSIS — K766 Portal hypertension: Secondary | ICD-10-CM

## 2013-09-04 DIAGNOSIS — R55 Syncope and collapse: Principal | ICD-10-CM

## 2013-09-04 DIAGNOSIS — K921 Melena: Secondary | ICD-10-CM

## 2013-09-04 DIAGNOSIS — K5520 Angiodysplasia of colon without hemorrhage: Secondary | ICD-10-CM

## 2013-09-04 DIAGNOSIS — J90 Pleural effusion, not elsewhere classified: Secondary | ICD-10-CM

## 2013-09-04 DIAGNOSIS — Z79899 Other long term (current) drug therapy: Secondary | ICD-10-CM

## 2013-09-04 DIAGNOSIS — E440 Moderate protein-calorie malnutrition: Secondary | ICD-10-CM

## 2013-09-04 DIAGNOSIS — I8390 Asymptomatic varicose veins of unspecified lower extremity: Secondary | ICD-10-CM | POA: Diagnosis present

## 2013-09-04 DIAGNOSIS — I2510 Atherosclerotic heart disease of native coronary artery without angina pectoris: Secondary | ICD-10-CM

## 2013-09-04 DIAGNOSIS — H3530 Unspecified macular degeneration: Secondary | ICD-10-CM | POA: Diagnosis present

## 2013-09-04 DIAGNOSIS — I10 Essential (primary) hypertension: Secondary | ICD-10-CM | POA: Diagnosis present

## 2013-09-04 DIAGNOSIS — D509 Iron deficiency anemia, unspecified: Secondary | ICD-10-CM

## 2013-09-04 DIAGNOSIS — I252 Old myocardial infarction: Secondary | ICD-10-CM

## 2013-09-04 DIAGNOSIS — E785 Hyperlipidemia, unspecified: Secondary | ICD-10-CM

## 2013-09-04 DIAGNOSIS — H9190 Unspecified hearing loss, unspecified ear: Secondary | ICD-10-CM

## 2013-09-04 DIAGNOSIS — I809 Phlebitis and thrombophlebitis of unspecified site: Secondary | ICD-10-CM

## 2013-09-04 DIAGNOSIS — I498 Other specified cardiac arrhythmias: Secondary | ICD-10-CM | POA: Diagnosis present

## 2013-09-04 DIAGNOSIS — K449 Diaphragmatic hernia without obstruction or gangrene: Secondary | ICD-10-CM

## 2013-09-04 DIAGNOSIS — E876 Hypokalemia: Secondary | ICD-10-CM | POA: Diagnosis present

## 2013-09-04 DIAGNOSIS — D696 Thrombocytopenia, unspecified: Secondary | ICD-10-CM

## 2013-09-04 DIAGNOSIS — K7460 Unspecified cirrhosis of liver: Secondary | ICD-10-CM

## 2013-09-04 DIAGNOSIS — I83893 Varicose veins of bilateral lower extremities with other complications: Secondary | ICD-10-CM

## 2013-09-04 DIAGNOSIS — K253 Acute gastric ulcer without hemorrhage or perforation: Secondary | ICD-10-CM

## 2013-09-04 LAB — COMPREHENSIVE METABOLIC PANEL WITH GFR
ALT: 33 U/L (ref 0–35)
AST: 54 U/L — ABNORMAL HIGH (ref 0–37)
Albumin: 3.2 g/dL — ABNORMAL LOW (ref 3.5–5.2)
Alkaline Phosphatase: 134 U/L — ABNORMAL HIGH (ref 39–117)
BUN: 11 mg/dL (ref 6–23)
CO2: 26 meq/L (ref 19–32)
Calcium: 9.4 mg/dL (ref 8.4–10.5)
Chloride: 103 meq/L (ref 96–112)
Creatinine, Ser: 0.9 mg/dL (ref 0.50–1.10)
GFR calc Af Amer: 67 mL/min — ABNORMAL LOW
GFR calc non Af Amer: 58 mL/min — ABNORMAL LOW
Glucose, Bld: 90 mg/dL (ref 70–99)
Potassium: 3.4 meq/L — ABNORMAL LOW (ref 3.5–5.1)
Sodium: 138 meq/L (ref 135–145)
Total Bilirubin: 0.7 mg/dL (ref 0.3–1.2)
Total Protein: 6.8 g/dL (ref 6.0–8.3)

## 2013-09-04 LAB — CBC WITH DIFFERENTIAL/PLATELET
Basophils Absolute: 0 10*3/uL (ref 0.0–0.1)
Basophils Relative: 1 % (ref 0–1)
Eosinophils Absolute: 0.4 10*3/uL (ref 0.0–0.7)
Eosinophils Relative: 7 % — ABNORMAL HIGH (ref 0–5)
Hemoglobin: 13.8 g/dL (ref 12.0–15.0)
MCH: 32.3 pg (ref 26.0–34.0)
MCHC: 34.2 g/dL (ref 30.0–36.0)
MCV: 94.4 fL (ref 78.0–100.0)
Monocytes Absolute: 0.9 10*3/uL (ref 0.1–1.0)
Monocytes Relative: 14 % — ABNORMAL HIGH (ref 3–12)
Neutrophils Relative %: 43 % (ref 43–77)
RDW: 12.9 % (ref 11.5–15.5)

## 2013-09-04 LAB — TROPONIN I
Troponin I: <0.3 ng/mL (ref <0.30)
Troponin I: <0.3 ng/mL (ref <0.30)

## 2013-09-04 LAB — URINALYSIS, ROUTINE W REFLEX MICROSCOPIC
Bilirubin Urine: NEGATIVE
Glucose, UA: NEGATIVE mg/dL
Ketones, ur: NEGATIVE mg/dL
Leukocytes, UA: NEGATIVE
Leukocytes, UA: NEGATIVE
Nitrite: NEGATIVE
Nitrite: NEGATIVE
Protein, ur: NEGATIVE mg/dL
Specific Gravity, Urine: 1.008 (ref 1.005–1.030)
Urobilinogen, UA: 1 mg/dL (ref 0.0–1.0)
pH: 6.5 (ref 5.0–8.0)

## 2013-09-04 LAB — PHOSPHORUS: Phosphorus: 2.3 mg/dL (ref 2.3–4.6)

## 2013-09-04 LAB — ETHANOL: Alcohol, Ethyl (B): <11 mg/dL (ref 0–11)

## 2013-09-04 MED ORDER — ENOXAPARIN SODIUM 40 MG/0.4ML ~~LOC~~ SOLN
40.0000 mg | SUBCUTANEOUS | Status: DC
  Administered 2013-09-04: 40 mg via SUBCUTANEOUS
  Filled 2013-09-04 (×2): qty 0.4

## 2013-09-04 MED ORDER — SODIUM CHLORIDE 0.9 % IJ SOLN
3.0000 mL | Freq: Two times a day (BID) | INTRAMUSCULAR | Status: DC
  Administered 2013-09-05: 3 mL via INTRAVENOUS

## 2013-09-04 MED ORDER — PANTOPRAZOLE SODIUM 40 MG PO TBEC
40.0000 mg | DELAYED_RELEASE_TABLET | Freq: Every day | ORAL | Status: DC
  Administered 2013-09-04 – 2013-09-05 (×2): 40 mg via ORAL
  Filled 2013-09-04 (×2): qty 1

## 2013-09-04 MED ORDER — HYDROCODONE-ACETAMINOPHEN 5-325 MG PO TABS
1.0000 | ORAL_TABLET | ORAL | Status: DC | PRN
  Administered 2013-09-04 – 2013-09-05 (×4): 1 via ORAL
  Filled 2013-09-04: qty 1
  Filled 2013-09-04: qty 56
  Filled 2013-09-04 (×2): qty 1

## 2013-09-04 MED ORDER — TRIAMCINOLONE ACETONIDE 0.1 % EX CREA
1.0000 "application " | TOPICAL_CREAM | Freq: Three times a day (TID) | CUTANEOUS | Status: DC
  Administered 2013-09-04 – 2013-09-05 (×2): 1 via TOPICAL
  Filled 2013-09-04: qty 15

## 2013-09-04 MED ORDER — ONDANSETRON HCL 4 MG/2ML IJ SOLN: 4.0000 mg | Freq: Four times a day (QID) | INTRAMUSCULAR | Status: DC | PRN

## 2013-09-04 MED ORDER — POTASSIUM CHLORIDE CRYS ER 20 MEQ PO TBCR
40.0000 meq | EXTENDED_RELEASE_TABLET | Freq: Once | ORAL | Status: AC
  Administered 2013-09-04: 40 meq via ORAL
  Filled 2013-09-04: qty 2

## 2013-09-04 MED ORDER — ATENOLOL 100 MG PO TABS
100.0000 mg | ORAL_TABLET | Freq: Every day | ORAL | Status: DC
  Administered 2013-09-05: 100 mg via ORAL
  Filled 2013-09-04 (×2): qty 1

## 2013-09-04 MED ORDER — ALPRAZOLAM 0.5 MG PO TABS
0.5000 mg | ORAL_TABLET | Freq: Every evening | ORAL | Status: DC | PRN
  Administered 2013-09-04: 22:00:00 0.5 mg via ORAL
  Filled 2013-09-04: qty 1

## 2013-09-04 MED ORDER — ONDANSETRON HCL 4 MG PO TABS: 4.0000 mg | ORAL_TABLET | Freq: Four times a day (QID) | ORAL | Status: DC | PRN

## 2013-09-04 MED ORDER — SODIUM CHLORIDE 0.9 % IV SOLN
INTRAVENOUS | Status: AC
  Administered 2013-09-04: 19:00:00 50 mL/h via INTRAVENOUS

## 2013-09-04 MED ORDER — NITROGLYCERIN 0.4 MG SL SUBL: 0.4000 mg | SUBLINGUAL_TABLET | SUBLINGUAL | Status: DC | PRN

## 2013-09-04 NOTE — ED Notes (Addendum)
Pt from home reports that she took her morning shower, walked into kitchen and stated that she "went blind". Pt states that she walked to her chair, sat down and called granddaughter to help. Pt reports that she never has LOC. Pt had venous ablation to L leg 4 weeks ago and states,"I 've been weak ever since." Pt denies SOB, CP. Pt is A&O and in NAD. Pt family at bedside.

## 2013-09-04 NOTE — ED Provider Notes (Signed)
CSN: [id number]     Arrival date & time 09/04/13  1207 History   First MD Initiated Contact with Patient 09/04/13 1309     Chief Complaint  Patient presents with  . Near Syncope   (Consider location/radiation/quality/duration/timing/severity/associated sxs/prior Treatment) HPI Comments: Pt had near syncopal episdoe to day that lasting 5-10 mins while she was standing in kitchen  Patient is a 79 y.o. female presenting with syncope. The history is provided by the patient. No language interpreter was used.  Loss of Consciousness Episode history:  Single Most recent episode:  Today Duration:  10 minutes Timing:  Rare Progression:  Resolved Chronicity:  New Context: normal activity   Context comment:  Standing Witnessed: no   Relieved by:  Lying down Worsened by:  Nothing tried Ineffective treatments:  None tried Associated symptoms: dizziness, malaise/fatigue, visual change and weakness   Associated symptoms: no chest pain, no confusion, no diaphoresis, no difficulty breathing, no fever, no focal sensory loss, no focal weakness, no headaches, no nausea, no palpitations, no recent fall, no recent injury, no recent surgery, no rectal bleeding, no seizures, no shortness of breath and no vomiting   Risk factors: coronary artery disease and vascular disease     Past Medical History  Diagnosis Date  . Anemia, iron deficiency 08/07/2011  . Angiodysplasia of intestinal tract 08/07/2011  . Hypertension   . Arthritis   . Hx of gastroesophageal reflux (GERD)   . Diverticulitis   . Phlebitis   . Macular degeneration   . TIA (transient ischemic attack)   . MI (myocardial infarction)   . Hiatal hernia   . AVM (arteriovenous malformation) of colon with hemorrhage   . Varicose veins of legs   . Cataract   . GERD (gastroesophageal reflux disease)   . DJD (degenerative joint disease)   . Diabetes mellitus     borderline  . History of blood transfusion   . PONV (postoperative nausea and  vomiting)   . hepatocellar ca dx'd 08/2012  . Leg pain   . Edema leg    Past Surgical History  Procedure Laterality Date  . 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 Abdominal hysterectomy    . Hemorrhoid surgery    . Esophagogastroduodenoscopy  08/30/2012    Procedure: ESOPHAGOGASTRODUODENOSCOPY (EGD);  Surgeon: [name], MD;  Location: [facility];  Service: Endoscopy;  Laterality: N/A;   Family History  Problem Relation Age of Onset  . Colon cancer Sister   . Coronary artery disease Father    History  Substance Use Topics  . Smoking status: Former Smoker -- 5.00 packs/day for 3 years    Quit date: 07/23/1990  . Smokeless tobacco: Never Used  . Alcohol Use: No   OB History   Grav Para Term Preterm Abortions TAB SAB Ect Mult Living                 Review of Systems  Constitutional: Positive for malaise/fatigue. Negative for fever, chills, diaphoresis, activity change, appetite change and fatigue.  HENT: Negative for congestion, facial swelling, rhinorrhea and sore throat.   Eyes: Positive for visual disturbance. Negative for photophobia and discharge.  Respiratory: Negative for cough, chest tightness and shortness of breath.   Cardiovascular: Positive for syncope. Negative for chest pain, palpitations and leg swelling.  Gastrointestinal: Negative for nausea, vomiting, abdominal pain and diarrhea.  Endocrine: Negative for polydipsia and polyuria.  Genitourinary: Negative for dysuria, frequency, difficulty urinating and pelvic pain.  Musculoskeletal:  Negative for arthralgias, back pain, neck pain and neck stiffness.  Skin: Negative for color change and wound.  Allergic/Immunologic: Negative for immunocompromised state.  Neurological: Positive for dizziness and weakness. Negative for focal weakness, seizures, facial asymmetry, numbness and headaches.  Hematological: Does not bruise/bleed easily.  Psychiatric/Behavioral: Negative for confusion and agitation.     Allergies  Avelox; Sulfa antibiotics; and Ciprofloxacin  Home Medications   No current outpatient prescriptions on file. BP 152/109  Pulse 59  Temp(Src) 98 F (36.7 C) (Oral)  Resp 25  SpO2 97% Physical Exam  Constitutional: She is oriented to person, place, and time. She appears well-developed and well-nourished. No distress.  HENT:  Head: Normocephalic and atraumatic.  Mouth/Throat: No oropharyngeal exudate.  Eyes: Pupils are equal, round, and reactive to light.  Neck: Normal range of motion. Neck supple.  Cardiovascular: Normal rate, regular rhythm and normal heart sounds.  Exam reveals no gallop and no friction rub.   No murmur heard. Pulmonary/Chest: Effort normal and breath sounds normal. No respiratory distress. She has no wheezes. She has no rales.  Abdominal: Soft. Bowel sounds are normal. She exhibits no distension and no mass. There is no tenderness. There is no rebound and no guarding.  Musculoskeletal: Normal range of motion. She exhibits no edema and no tenderness.  Neurological: She is alert and oriented to person, place, and time.  Skin: Skin is warm and dry.  Psychiatric: She has a normal mood and affect.    ED Course  Procedures (including critical care time) Labs Review Labs Reviewed  CBC WITH DIFFERENTIAL - Abnormal; Notable for the following:    Platelets 138 (*)    Monocytes Relative 14 (*)    Eosinophils Relative 7 (*)    All other components within normal limits  COMPREHENSIVE METABOLIC PANEL - Abnormal; Notable for the following:    Potassium 3.4 (*)    Albumin 3.2 (*)    AST 54 (*)    Alkaline Phosphatase 134 (*)    GFR calc non Af Amer 58 (*)    GFR calc Af Amer 67 (*)    All other components within normal limits  URINALYSIS, ROUTINE W REFLEX MICROSCOPIC - Abnormal; Notable for the following:    APPearance CLOUDY (*)    All other components within normal limits  GLUCOSE, CAPILLARY  TROPONIN I  ETHANOL  MAGNESIUM  PHOSPHORUS  TSH   TROPONIN I  TROPONIN I  TROPONIN I  URINALYSIS, ROUTINE W REFLEX MICROSCOPIC  BASIC METABOLIC PANEL  CBC   Imaging Review Dg Chest 2 View  09/04/2013   CLINICAL DATA:  Near syncope  EXAM: CHEST  2 VIEW  COMPARISON:  01/19/2013  FINDINGS: Chronic interstitial markings/ emphysematous changes. Mild left basilar scarring versus atelectasis. No focal consolidation. No pleural effusion or pneumothorax.  The heart is normal in size.  Moderate to large hiatal hernia.  Degenerative changes of the visualized thoracolumbar spine.  IMPRESSION: No evidence of acute cardiopulmonary disease.   Electronically Signed   By: Charline Bills M.D.   On: 09/04/2013 14:51   Ct Head Wo Contrast  09/04/2013   CLINICAL DATA:  Decreased vision, imbalance  EXAM: CT HEAD WITHOUT CONTRAST  TECHNIQUE: Contiguous axial images were obtained from the base of the skull through the vertex without intravenous contrast.  COMPARISON:  MRI brain dated 12/14/2011  FINDINGS: No evidence of parenchymal hemorrhage or extra-axial fluid collection. No mass lesion, mass effect, or midline shift.  No CT evidence of acute infarction.  Mild  subcortical white matter and periventricular small vessel ischemic changes.  Cerebral volume is within normal limits.  No ventriculomegaly.  The visualized paranasal sinuses are essentially clear. The mastoid air cells are unopacified.  IMPRESSION: No evidence of acute intracranial abnormality.  Mild small vessel ischemic changes.   Electronically Signed   By: Charline Bills M.D.   On: 09/04/2013 15:30    EKG Interpretation    Date/Time:  Saturday September 04 2013 12:39:04 EST Ventricular Rate:  56 PR Interval:  195 QRS Duration: 120 QT Interval:  441 QTC Calculation: 426 R Axis:   -36 Text Interpretation:  Sinus rhythm Nonspecific IVCD with LAD Left ventricular hypertrophy No significant change since last tracing Confirmed by DOCHERTY  MD, MEGAN ([id number]) on 09/04/2013 4:06:12 PM             MDM   1. Near syncope   2. Anemia, iron deficiency   3. Angiodysplasia of intestinal tract    Pt is a 79 y.o. female with Pmhx as above who presents with near syncopal episode at home today, lasting 5-10 mins, described as sudden onset vision loss, lightheadedness, feeling off balance.  Denies CP, SOB, n/v, diaphoresis, but has had several weeks of malaise, anorexia.  On PE, Pt mildly bradycardic, VS otherwise stable.  EKG unchanged from prior.  Cardiopulm exam, abdominal, neuro exam benign.  W/u shows unremarkable CT head, CXR.  Trop negative. Given pmhx, age, pt admitted to [facility] for further w/u.         Shanna Cisco, MD 09/04/13 [phone]

## 2013-09-04 NOTE — H&P (Signed)
[facility] History and Physical  [name] WGN:[id number] DOB: 05-17-1928 DOA: 09/04/2013  Referring physician: ED physician PCP: Nadean Corwin, MD   Chief Complaint: near syncope   HPI:  Pt is 79 yo female with HTN who presented to [facility] ED after an episode of near syncope that occurred several hours prior to this admission. This was associated with lightheadedness and feeling off balance and pt explains it lasted 5-10 minutes. Pt never fell down but reports feeling like she would pass out. She reports similar events in the past but can remember what caused it. She denies chest pain or shortness of breath, no abdominal or urinary concerns, no fevers, chills, no specific focal neurological deficits.  In ED, pt hemodynamically stable, no acute findings on imaging studies. [facility] asked to admit to telemetry unit for further evaluation and management of near syncope. `  Assessment and Plan:  Near syncope - unclear triggering event - will admit to telemetry bed and will start with providing supportive care with oxygen as needed  - will check 12 lead EKG, TSH, orthostatic vitals - check CE's as well, UA, BNP - PT evaluation  Hypokalemia - mild - will supplement and repeat BMP in AM Bradycardia - mild and possibly due to Atenolol - will continue Atenolol as pt on my exam with HR in 60-70's - if bradycardia recurs consider lowering dose of Atenolol   Code Status: Full Family Communication: Pt at bedside Disposition Plan: Admit to telemetry unit   Review of Systems:  Constitutional: Negative for fever, chills and malaise/fatigue. Negative for diaphoresis.  HENT: Negative for hearing loss, ear pain, nosebleeds, congestion, sore throat, neck pain, tinnitus and ear discharge.   Eyes: Negative for blurred vision, double vision, photophobia, pain, discharge and redness.  Respiratory: Negative for cough, hemoptysis, sputum production, shortness of breath, wheezing and stridor.    Cardiovascular: Negative for chest pain, palpitations, orthopnea, claudication and leg swelling.  Gastrointestinal: Negative for nausea, vomiting and abdominal pain. Negative for heartburn, constipation, blood in stool and melena.  Genitourinary: Negative for dysuria, urgency, frequency, hematuria and flank pain.  Musculoskeletal: Negative for myalgias, back pain.  Skin: Negative for itching and rash.  Neurological:  Negative for tingling, tremors, sensory change, speech change, focal weakness, loss of consciousness and headaches.  Endo/Heme/Allergies: Negative for environmental allergies and polydipsia. Does not bruise/bleed easily.  Psychiatric/Behavioral: Negative for suicidal ideas. The patient is not nervous/anxious.      Past Medical History  Diagnosis Date  . Anemia, iron deficiency 08/07/2011  . Angiodysplasia of intestinal tract 08/07/2011  . Hypertension   . Arthritis   . Hx of gastroesophageal reflux (GERD)   . Diverticulitis   . Phlebitis   . Macular degeneration   . TIA (transient ischemic attack)   . MI (myocardial infarction)   . Hiatal hernia   . AVM (arteriovenous malformation) of colon with hemorrhage   . Varicose veins of legs   . Cataract   . GERD (gastroesophageal reflux disease)   . DJD (degenerative joint disease)   . Diabetes mellitus     borderline  . History of blood transfusion   . PONV (postoperative nausea and vomiting)   . hepatocellar ca dx'd 08/2012  . Leg pain   . Edema leg     Past Surgical History  Procedure Laterality Date  . 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 Abdominal hysterectomy    . Hemorrhoid surgery    . Esophagogastroduodenoscopy  08/30/2012  Procedure: ESOPHAGOGASTRODUODENOSCOPY (EGD);  Surgeon: [name], MD;  Location: [facility];  Service: Endoscopy;  Laterality: N/A;    Social History:  reports that she quit smoking about 23 years ago. She has never used smokeless tobacco. She reports that she  does not drink alcohol or use illicit drugs.  Allergies  Allergen Reactions  . Avelox [Moxifloxacin Hcl In Nacl] Anaphylaxis  . Sulfa Antibiotics Hives  . Ciprofloxacin Other (See Comments)    Complains of sore mouth    Family History  Problem Relation Age of Onset  . Colon cancer Sister   . Coronary artery disease Father     Prior to Admission medications   Medication Sig Start Date End Date Taking? Authorizing Provider  ALPRAZolam Prudy Feeler) 1 MG tablet Take 0.5 mg by mouth at bedtime as needed for sleep or anxiety.   Yes Historical Provider, MD  atenolol (TENORMIN) 100 MG tablet Take 100 mg by mouth daily before breakfast.    Yes Historical Provider, MD  bumetanide (BUMEX) 2 MG tablet Take 2 mg by mouth daily as needed (edema).   Yes Historical Provider, MD  cholecalciferol (VITAMIN D) 1000 UNITS tablet Take 1,000 Units by mouth daily.   Yes Historical Provider, MD  cyclobenzaprine (FLEXERIL) 10 MG tablet Take 10 mg by mouth 2 (two) times daily as needed. MUSCLE SPASM.   Yes Eldred Manges, MD  NITROSTAT 0.4 MG SL tablet Place 0.4 mg under the tongue every 5 (five) minutes as needed for chest pain.  07/05/11  Yes Historical Provider, MD  omeprazole (PRILOSEC) 40 MG capsule Take 40 mg by mouth daily as needed.    Yes Historical Provider, MD  triamcinolone cream (KENALOG) 0.1 % Apply 1 application topically 2 (two) times daily as needed. Eczema 07/09/12  Yes Historical Provider, MD    Physical Exam: Filed Vitals:   09/04/13 1231 09/04/13 1416 09/04/13 1418 09/04/13 1422  BP: 115/62 129/55 133/73 152/109  Pulse: 59 59 58 59  Temp: 98 F (36.7 C)     TempSrc: Oral     Resp: 16 25    SpO2: 96% 97%      Physical Exam  Constitutional: Appears well-developed and well-nourished. No distress.  HENT: Normocephalic. External right and left ear normal. Oropharynx is clear and moist.  Eyes: Conjunctivae and EOM are normal. PERRLA, no scleral icterus.  Neck: Normal ROM. Neck supple. No  JVD. No tracheal deviation. No thyromegaly.  CVS: RRR, S1/S2 +, no murmurs, no gallops, no carotid bruit.  Pulmonary: Effort and breath sounds normal, no stridor, rhonchi, wheezes, rales.  Abdominal: Soft. BS +,  no distension, tenderness, rebound or guarding.  Musculoskeletal: Normal range of motion. No edema and no tenderness.  Lymphadenopathy: No lymphadenopathy noted, cervical, inguinal. Neuro: Alert. Normal reflexes, muscle tone coordination. No cranial nerve deficit. Skin: Skin is warm and dry. No rash noted. Not diaphoretic. No erythema. No pallor.  Psychiatric: Normal mood and affect. Behavior, judgment, thought content normal.   Labs on Admission:  Basic Metabolic Panel:  Recent Labs Lab 09/04/13 1255  NA 138  K 3.4*  CL 103  CO2 26  GLUCOSE 90  BUN 11  CREATININE 0.90  CALCIUM 9.4   Liver Function Tests:  Recent Labs Lab 09/04/13 1255  AST 54*  ALT 33  ALKPHOS 134*  BILITOT 0.7  PROT 6.8  ALBUMIN 3.2*   CBC:  Recent Labs Lab 09/04/13 1255  WBC 6.2  NEUTROABS 2.7  HGB 13.8  HCT 40.3  MCV  94.4  PLT 138*   Cardiac Enzymes:  Recent Labs Lab 09/04/13 1255  TROPONINI <0.30   CBG:  Recent Labs Lab 09/04/13 1217  GLUCAP 95    Radiological Exams on Admission: Dg Chest 2 View  09/04/2013   CLINICAL DATA:  Near syncope  EXAM: CHEST  2 VIEW  COMPARISON:  01/19/2013  FINDINGS: Chronic interstitial markings/ emphysematous changes. Mild left basilar scarring versus atelectasis. No focal consolidation. No pleural effusion or pneumothorax.  The heart is normal in size.  Moderate to large hiatal hernia.  Degenerative changes of the visualized thoracolumbar spine.  IMPRESSION: No evidence of acute cardiopulmonary disease.   Electronically Signed   By: Charline Bills M.D.   On: 09/04/2013 14:51   Ct Head Wo Contrast  09/04/2013   CLINICAL DATA:  Decreased vision, imbalance  EXAM: CT HEAD WITHOUT CONTRAST  TECHNIQUE: Contiguous axial images were obtained  from the base of the skull through the vertex without intravenous contrast.  COMPARISON:  MRI brain dated 12/14/2011  FINDINGS: No evidence of parenchymal hemorrhage or extra-axial fluid collection. No mass lesion, mass effect, or midline shift.  No CT evidence of acute infarction.  Mild subcortical white matter and periventricular small vessel ischemic changes.  Cerebral volume is within normal limits.  No ventriculomegaly.  The visualized paranasal sinuses are essentially clear. The mastoid air cells are unopacified.  IMPRESSION: No evidence of acute intracranial abnormality.  Mild small vessel ischemic changes.   Electronically Signed   By: Charline Bills M.D.   On: 09/04/2013 15:30    EKG: Normal sinus rhythm, no ST/T wave changes  Debbora Presto, MD  [facility] Pager [phone]  If 7PM-7AM, please contact night-coverage [email or web address] Password [facility] 09/04/2013, 5:19 PM

## 2013-09-04 NOTE — ED Notes (Signed)
Pt aware of the need for urine, unable to provide. Gave pt water per Dr. Micheline Maze

## 2013-09-04 NOTE — ED Notes (Signed)
Called lab to see why there is a delay in labs. Lab tech stated that one of the analyzers in lab is down and all electrolytes will result later until problem is resolved

## 2013-09-05 LAB — BASIC METABOLIC PANEL
BUN: 10 mg/dL (ref 6–23)
Calcium: 8.9 mg/dL (ref 8.4–10.5)
Creatinine, Ser: 0.93 mg/dL (ref 0.50–1.10)
GFR calc Af Amer: 64 mL/min — ABNORMAL LOW (ref >90)
Glucose, Bld: 102 mg/dL — ABNORMAL HIGH (ref 70–99)
Potassium: 3.5 mEq/L (ref 3.5–5.1)

## 2013-09-05 LAB — CBC
HCT: 37.4 % (ref 36.0–46.0)
MCH: 32 pg (ref 26.0–34.0)
MCHC: 34 g/dL (ref 30.0–36.0)
MCV: 94.2 fL (ref 78.0–100.0)
Platelets: 105 10*3/uL — ABNORMAL LOW (ref 150–400)
RBC: 3.97 MIL/uL (ref 3.87–5.11)
RDW: 12.9 % (ref 11.5–15.5)
WBC: 4.9 10*3/uL (ref 4.0–10.5)

## 2013-09-05 LAB — TSH: TSH: 1.869 u[IU]/mL (ref 0.350–4.500)

## 2013-09-05 LAB — TROPONIN I
Troponin I: <0.3 ng/mL (ref <0.30)
Troponin I: <0.3 ng/mL (ref <0.30)

## 2013-09-05 MED ORDER — ATENOLOL 50 MG PO TABS: 50.0000 mg | ORAL_TABLET | Freq: Every day | ORAL | Status: DC

## 2013-09-05 NOTE — Discharge Summary (Signed)
Physician Discharge Summary  [name] ZOX:[id number] DOB: Oct 23, 1927 DOA: 09/04/2013  PCP: Nadean Corwin, MD  Admit date: 09/04/2013 Discharge date: 09/05/2013  Recommendations for Outpatient Follow-up:  1. Pt will need to follow up with PCP in 2-3 weeks post discharge 2. Please obtain BMP to evaluate electrolytes and kidney function 3. Please also check CBC to evaluate Hg and Hct levels 4. Please note that pt's HR was noted to be on occasions in low 50's so please reassess and adjust the dose of Atenolol as indicated   Discharge Diagnoses:  Active Problems:   Syncope  Discharge Condition: Stable  Diet recommendation: Heart healthy diet discussed in details   HPI:  Pt is 79 yo female with HTN who presented to [facility] ED after an episode of near syncope that occurred several hours prior to this admission. This was associated with lightheadedness and feeling off balance and pt explains it lasted 5-10 minutes. Pt never fell down but reports feeling like she would pass out. She reports similar events in the past but can remember what caused it. She denies chest pain or shortness of breath, no abdominal or urinary concerns, no fevers, chills, no specific focal neurological deficits.   In ED, pt hemodynamically stable, no acute findings on imaging studies. [facility] asked to admit to telemetry unit for further evaluation and management of near syncope.   Assessment and Plan:  Near syncope  - unclear triggering event  - now resolved - 12 lead EKG, TSH, orthostatic vitals all stable and within normal limits  - CE x 4 sets negative - UA unremarkable, CXR with no evidence of acute cardiopulmonary disease  Hypokalemia  - mild  - supplemented and within normal limits this AM  Bradycardia  - mild and possibly due to Atenolol  - will continue Atenolol as pt on my exam with HR in 60-70's  - if bradycardia recurs consider lowering dose of Atenolol   Procedures/Studies: Dg Chest 2 View    09/04/2013   No evidence of acute cardiopulmonary disease.  Ct Head Wo Contrast   09/04/2013  No evidence of acute intracranial abnormality.  Mild small vessel ischemic changes.     Consultations:  None   Antibiotics:  None  Discharge Exam: Filed Vitals:   09/05/13 0919  BP: 133/65  Pulse: 57  Temp:   Resp:    Filed Vitals:   09/04/13 1422 09/04/13 2200 09/05/13 0540 09/05/13 0919  BP: 152/109 137/79 130/53 133/65  Pulse: 59 60 56 57  Temp:  98.2 F (36.8 C) 97.3 F (36.3 C)   TempSrc:  Oral Oral   Resp:  20 20   Weight:   82.9 kg (182 lb 12.2 oz)   SpO2:  95% 96%     General: Pt is alert, follows commands appropriately, not in acute distress Cardiovascular: Regular rate and rhythm, S1/S2 +, no murmurs, no rubs, no gallops Respiratory: Clear to auscultation bilaterally, no wheezing, no crackles, no rhonchi Abdominal: Soft, non tender, non distended, bowel sounds +, no guarding Extremities: no edema, no cyanosis, pulses palpable bilaterally DP and PT Neuro: Grossly nonfocal  Discharge Instructions  Discharge Orders   Future Appointments Provider Department Dept Phone   10/26/2013 2:45 PM Quentin Mulling, [place] [facility] [phone]   01/24/2014 2:30 PM Lucky Cowboy, MD [place] [facility] [phone]   Future Orders Complete By Expires   Diet - low sodium heart healthy  As directed    Increase activity slowly  As directed        Medication List         ALPRAZolam 1 MG tablet  Commonly known as:  XANAX  Take 0.5 mg by mouth at bedtime as needed for sleep or anxiety.     atenolol 50 MG tablet  Commonly known as:  TENORMIN  Take 1 tablet (50 mg total) by mouth daily before breakfast.     bumetanide 2 MG tablet  Commonly known as:  BUMEX  Take 2 mg by mouth daily as needed (edema).     cholecalciferol 1000 UNITS tablet  Commonly known as:  VITAMIN D  Take 1,000 Units by mouth daily.      cyclobenzaprine 10 MG tablet  Commonly known as:  FLEXERIL  Take 10 mg by mouth 2 (two) times daily as needed. MUSCLE SPASM.     NITROSTAT 0.4 MG SL tablet  Generic drug:  nitroGLYCERIN  Place 0.4 mg under the tongue every 5 (five) minutes as needed for chest pain.     omeprazole 40 MG capsule  Commonly known as:  PRILOSEC  Take 40 mg by mouth daily as needed.     triamcinolone cream 0.1 %  Commonly known as:  KENALOG  Apply 1 application topically 2 (two) times daily as needed. Eczema           Follow-up Information   Follow up with MCKEOWN,WILLIAM DAVID, MD In 1 week.   Specialty:  Internal Medicine   Contact information:   [address] [place] [address] [phone]       Follow up with Debbora Presto, MD. (As needed if symptoms worsencall my cell phone with questions [phone])    Specialty:  Internal Medicine   Contact information:   [address] [place] [address] [phone]        The results of significant diagnostics from this hospitalization (including imaging, microbiology, ancillary and laboratory) are listed below for reference.     Microbiology: No results found for this or any previous visit (from the past 240 hour(s)).   Labs: Basic Metabolic Panel:  Recent Labs Lab 09/04/13 1255 09/04/13 1835 09/05/13 0642  NA 138  --  140  K 3.4*  --  3.5  CL 103  --  109  CO2 26  --  24  GLUCOSE 90  --  102*  BUN 11  --  10  CREATININE 0.90  --  0.93  CALCIUM 9.4  --  8.9  MG  --  1.9  --   PHOS  --  2.3  --    Liver Function Tests:  Recent Labs Lab 09/04/13 1255  AST 54*  ALT 33  ALKPHOS 134*  BILITOT 0.7  PROT 6.8  ALBUMIN 3.2*   CBC:  Recent Labs Lab 09/04/13 1255 09/05/13 0642  WBC 6.2 4.9  NEUTROABS 2.7  --   HGB 13.8 12.7  HCT 40.3 37.4  MCV 94.4 94.2  PLT 138* 105*   Cardiac Enzymes:  Recent Labs Lab 09/04/13 1255 09/04/13 1835 09/05/13 0041 09/05/13 0642  TROPONINI <0.30  <0.30 <0.30 <0.30  CBG:  Recent Labs Lab 09/04/13 1217  GLUCAP 95   SIGNED: Time coordinating discharge: Over 30 minutes  Debbora Presto, MD  [facility] 09/05/2013, 12:33 PM Pager [phone]  If 7PM-7AM, please contact night-coverage [email or web address] Password [email or web address]

## 2013-09-10 ENCOUNTER — Ambulatory Visit (INDEPENDENT_AMBULATORY_CARE_PROVIDER_SITE_OTHER): Payer: Medicare Other | Admitting: Internal Medicine

## 2013-09-10 ENCOUNTER — Encounter: Payer: Self-pay | Admitting: Internal Medicine

## 2013-09-10 VITALS — BP 116/62 | HR 64 | Temp 98.1°F | Resp 18 | Wt 186.6 lb

## 2013-09-10 DIAGNOSIS — C228 Malignant neoplasm of liver, primary, unspecified as to type: Secondary | ICD-10-CM

## 2013-09-10 DIAGNOSIS — I10 Essential (primary) hypertension: Secondary | ICD-10-CM

## 2013-09-10 DIAGNOSIS — C220 Liver cell carcinoma: Secondary | ICD-10-CM

## 2013-09-10 DIAGNOSIS — Z79899 Other long term (current) drug therapy: Secondary | ICD-10-CM

## 2013-09-10 DIAGNOSIS — R609 Edema, unspecified: Secondary | ICD-10-CM

## 2013-09-10 LAB — CBC WITH DIFFERENTIAL/PLATELET
Basophils Relative: 0 % (ref 0–1)
Eosinophils Absolute: 0.5 10*3/uL (ref 0.0–0.7)
Eosinophils Relative: 7 % — ABNORMAL HIGH (ref 0–5)
HCT: 40.8 % (ref 36.0–46.0)
Hemoglobin: 14.2 g/dL (ref 12.0–15.0)
MCH: 33.1 pg (ref 26.0–34.0)
MCHC: 34.8 g/dL (ref 30.0–36.0)
MCV: 95.1 fL (ref 78.0–100.0)
Monocytes Relative: 12 % (ref 3–12)
Neutro Abs: 2.8 10*3/uL (ref 1.7–7.7)
Neutrophils Relative %: 45 % (ref 43–77)
Platelets: 132 10*3/uL — ABNORMAL LOW (ref 150–400)
RBC: 4.29 MIL/uL (ref 3.87–5.11)

## 2013-09-10 LAB — BASIC METABOLIC PANEL WITH GFR
Calcium: 9.6 mg/dL (ref 8.4–10.5)
GFR, Est African American: 69 mL/min
GFR, Est Non African American: 60 mL/min
Glucose, Bld: 100 mg/dL — ABNORMAL HIGH (ref 70–99)
Potassium: 4.2 mEq/L (ref 3.5–5.3)
Sodium: 139 mEq/L (ref 135–145)

## 2013-09-10 LAB — HEPATIC FUNCTION PANEL
Albumin: 3.4 g/dL — ABNORMAL LOW (ref 3.5–5.2)
Bilirubin, Direct: 0.2 mg/dL (ref 0.0–0.3)
Indirect Bilirubin: 0.7 mg/dL (ref 0.0–0.9)
Total Bilirubin: 0.9 mg/dL (ref 0.3–1.2)

## 2013-09-10 LAB — MAGNESIUM: Magnesium: 1.8 mg/dL (ref 1.5–2.5)

## 2013-09-10 NOTE — Patient Instructions (Signed)
Continue diet & medications same as discussed.   Further disposition pending lab results.    Near-Syncope Near-syncope (commonly known as near fainting) is sudden weakness, dizziness, or feeling like you might pass out. During an episode of near-syncope, you may also develop pale skin, have tunnel vision, or feel sick to your stomach (nauseous). Near-syncope may occur when getting up after sitting or while standing for a long time. It is caused by a sudden decrease in blood flow to the brain. This decrease can result from various causes or triggers, most of which are not serious. However, because near-syncope can sometimes be a sign of something serious, a medical evaluation is required. The specific cause is often not determined. HOME CARE INSTRUCTIONS  Monitor your condition for any changes. The following actions may help to alleviate any discomfort you are experiencing:  Have someone stay with you until you feel stable.  Lie down right away if you start feeling like you might faint. Breathe deeply and steadily. Wait until all the symptoms have passed. Most of these episodes last only a few minutes. You may feel tired for several hours.   Drink enough fluids to keep your urine clear or pale yellow.   If you are taking blood pressure or heart medicine, get up slowly when seated or lying down. Take several minutes to sit and then stand. This can reduce dizziness.  Follow up with your health care provider as directed. SEEK IMMEDIATE MEDICAL CARE IF:   You have a severe headache.   You have unusual pain in the chest, abdomen, or back.   You are bleeding from the mouth or rectum, or you have black or tarry stool.   You have an irregular or very fast heartbeat.   You have repeated fainting or have seizure-like jerking during an episode.   You faint when sitting or lying down.   You have confusion.   You have difficulty walking.   You have severe weakness.   You have  vision problems.  MAKE SURE YOU:   Understand these instructions.  Will watch your condition.  Will get help right away if you are not doing well or get worse. Document Released: 09/09/2005 Document Revised: 05/12/2013 Document Reviewed: 02/12/2013 [facility] Patient Information 2014 [place], [place].  Hypertension As your heart beats, it forces blood through your arteries. This force is your blood pressure. If the pressure is too high, it is called hypertension (HTN) or high blood pressure. HTN is dangerous because you may have it and not know it. High blood pressure may mean that your heart has to work harder to pump blood. Your arteries may be narrow or stiff. The extra work puts you at risk for heart disease, stroke, and other problems.  Blood pressure consists of two numbers, a higher number over a lower, 110/72, for example. It is stated as "110 over 72." The ideal is below 120 for the top number (systolic) and under 80 for the bottom (diastolic). Write down your blood pressure today. You should pay close attention to your blood pressure if you have certain conditions such as:  Heart failure.  Prior heart attack.  Diabetes  Chronic kidney disease.  Prior stroke.  Multiple risk factors for heart disease. To see if you have HTN, your blood pressure should be measured while you are seated with your arm held at the level of the heart. It should be measured at least twice. A one-time elevated blood pressure reading (especially in the Emergency Department) does  not mean that you need treatment. There may be conditions in which the blood pressure is different between your right and left arms. It is important to see your caregiver soon for a recheck. Most people have essential hypertension which means that there is not a specific cause. This type of high blood pressure may be lowered by changing lifestyle factors such as:  Stress.  Smoking.  Lack of exercise.  Excessive  weight.  Drug/tobacco/alcohol use.  Eating less salt. Most people do not have symptoms from high blood pressure until it has caused damage to the body. Effective treatment can often prevent, delay or reduce that damage. TREATMENT  When a cause has been identified, treatment for high blood pressure is directed at the cause. There are a large number of medications to treat HTN. These fall into several categories, and your caregiver will help you select the medicines that are best for you. Medications may have side effects. You should review side effects with your caregiver. If your blood pressure stays high after you have made lifestyle changes or started on medicines,   Your medication(s) may need to be changed.  Other problems may need to be addressed.  Be certain you understand your prescriptions, and know how and when to take your medicine.  Be sure to follow up with your caregiver within the time frame advised (usually within two weeks) to have your blood pressure rechecked and to review your medications.  If you are taking more than one medicine to lower your blood pressure, make sure you know how and at what times they should be taken. Taking two medicines at the same time can result in blood pressure that is too low. SEEK IMMEDIATE MEDICAL CARE IF:  You develop a severe headache, blurred or changing vision, or confusion.  You have unusual weakness or numbness, or a faint feeling.  You have severe chest or abdominal pain, vomiting, or breathing problems. MAKE SURE YOU:   Understand these instructions.  Will watch your condition.  Will get help right away if you are not doing well or get worse. Document Released: 09/09/2005 Document Revised: 12/02/2011 Document Reviewed: 04/29/2008 [facility] Patient Information 2014 [place], [place].

## 2013-09-10 NOTE — Progress Notes (Signed)
Patient ID: [name], female   DOB: [birth year], 79 y.o.   MRN: [id number]   This very nice 79 y.o.female  With NASH and cirrhosis who was recently hospitalized  13 December following a syncopal episode and monitored overnight who presents for  follow up with Hypertension, Hyperlipidemia, Pre-Diabetes and Vitamin D Deficiency. Apparently w/u was unrevealing with no arrhythmias found other than slower pulse in the low 50's and her Atenolol dose was halved as was her diuretic dose reduced from bid to qd. CTscan of brain was negative. Apparently K+ was low and supplemented during hospitalization. Since home and over the last 4 days she has felt well.   Pulses at home have been in the 50-60's range. Today's  BP: 116/62 mmHg . Patient denies any cardiac type chest pain, palpitations, dyspnea/orthopnea/PND, dizziness, claudication, or dependent edema.   Hyperlipidemia is controlled with diet & meds. Also, the patient has history of PreDiabetes. Patient denies any symptoms of reactive hypoglycemia, diabetic polys, paresthesias or visual blurring. 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 Further, Patient has history of Vitamin D Deficiency.  Medication Sig Dispense Refill  . ALPRAZolam (XANAX) 1 MG tablet Take 0.5 mg by mouth at bedtime as needed for sleep or anxiety.      Marland Kitchen atenolol (TENORMIN) 100 MG tablet Take 1/2  tablet (50 mg total) by mouth daily before breakfast.      . bumetanide (BUMEX) 2 MG tablet Take 2 mg by mouth daily as needed (edema).      . cholecalciferol (VITAMIN D) 1000 UNITS tablet Take 1,000 Units by mouth daily.      . cyclobenzaprine (FLEXERIL) 10 MG tablet Take 10 mg by mouth 2 (two) times daily as needed. MUSCLE SPASM.      Marland Kitchen NITROSTAT 0.4 MG SL tablet Place 0.4 mg under the tongue every 5 (five) minutes as needed for chest pain.       Marland Kitchen omeprazole (PRILOSEC) 40 MG capsule Take 40 mg by mouth daily as needed.       . triamcinolone cream  (KENALOG) 0.1 % Apply 1 application topically 2 (two) times daily as needed. Eczema      . [DISCONTINUED] fluvastatin XL (LESCOL XL) 80 MG 24 hr tablet Take 80 mg by mouth daily.           Allergies  Allergen Reactions  . Avelox [Moxifloxacin Hcl In Nacl] Anaphylaxis  . Sulfa Antibiotics Hives  . Ciprofloxacin Other (See Comments)    Complains of sore mouth    PMHx:   Past Medical History  Diagnosis Date  . Anemia, iron deficiency 08/07/2011  . Angiodysplasia of intestinal tract 08/07/2011  . Hypertension   . Arthritis   . Hx of gastroesophageal reflux (GERD)   . Diverticulitis   . Phlebitis   . Macular degeneration   . TIA (transient ischemic attack)   . MI (myocardial infarction)   . Hiatal hernia   . AVM (arteriovenous malformation) of colon with hemorrhage   . Varicose veins of legs   . Cataract   . GERD (gastroesophageal reflux disease)   . DJD (degenerative joint disease)   . Diabetes mellitus     borderline  . History of blood transfusion   . PONV (postoperative nausea and vomiting)   . hepatocellar ca dx'd 08/2012  . Leg pain   . Edema leg     FHx:    Reviewed / unchanged  SHx:  Reviewed / unchanged  Systems Review: Constitutional: Denies fever, chills, wt changes, headaches, insomnia, fatigue, night sweats, change in appetite. Eyes: Denies redness, blurred vision, diplopia, discharge, itchy, watery eyes.  ENT: Denies discharge, congestion, post nasal drip, epistaxis, sore throat, earache, hearing loss, dental pain, tinnitus, vertigo, sinus pain, snoring.  CV: Denies chest pain, palpitations, irregular heartbeat, syncope, dyspnea, diaphoresis, orthopnea, PND, claudication, edema. Respiratory: denies cough, dyspnea, DOE, pleurisy, hoarseness, laryngitis, wheezing.  Gastrointestinal: Denies dysphagia, odynophagia, heartburn, reflux, water brash, abdominal pain or cramps, nausea, vomiting, bloating, diarrhea, constipation, hematemesis, melena, hematochezia,   or hemorrhoids. Genitourinary: Denies dysuria, frequency, urgency, nocturia, hesitancy, discharge, hematuria, flank pain. Musculoskeletal: Denies arthralgias, myalgias, stiffness, jt. swelling, pain, limp, strain/sprain.  Skin: Denies pruritus, rash, hives, warts, acne, eczema, change in skin lesion(s). Neuro: No weakness, tremor, incoordination, spasms, paresthesia, or pain. Psychiatric: Denies confusion, memory loss, or sensory loss. Endo: Denies change in weight, skin, hair change.  Heme/Lymph: No excessive bleeding, bruising, orenlarged lymph nodes.  BP: 116/62  Pulse: 64  Temp: 98.1 F (36.7 C)  Resp: 18    Estimated body mass index is 30.13 kg/(m^2) as calculated from the following:   Height as of 08/17/13: 5\' 6"  (1.676 m).   Weight as of this encounter: 186 lb 9.6 oz (84.641 kg).  On Exam: Appears well nourished - in no distress. Eyes: PERRLA, EOMs, conjunctiva no swelling or erythema. Sinuses: No frontal/maxillary tenderness ENT/Mouth: EAC's clear, TM's nl w/o erythema, bulging. Nares clear w/o erythema, swelling, exudates. Oropharynx clear without erythema or exudates. Oral hygiene is good. Tongue normal, non obstructing. Hearing intact.  Neck: Supple. Thyroid nl. Car 2+/2+ without bruits, nodes or JVD. Chest: Respirations nl with BS clear & equal w/o rales, rhonchi, wheezing or stridor.  Cor: Heart sounds normal w/ regular rate and rhythm without sig. murmurs, gallops, clicks, or rubs. Peripheral pulses normal and equal  without edema.  Abdomen: Soft & bowel sounds normal. Non-tender w/o guarding, rebound, hernias, masses, or organomegaly.  Lymphatics: Unremarkable.  Musculoskeletal: Full ROM all peripheral extremities, joint stability, 5/5 strength, and normal gait.  Skin: Warm, dry without exposed rashes, lesions, ecchymosis apparent.  Neuro: Cranial nerves intact, reflexes equal bilaterally. Sensory-motor testing grossly intact. Tendon reflexes grossly intact.  Pysch:  Alert & oriented x 3. Insight and judgement nl & appropriate. No ideations.  Assessment and Plan:  1. Hypertension - Continue monitor blood pressure and Pulse  at home. Continue diet/meds same. Recheck BMET, CBC, Mg  2. Hyperlipidemia - Continue diet/meds, exercise,& lifestyle modifications. Continue monitor periodic cholesterol/liver & renal functions   3. Pre-diabetes - Continue diet, exercise, lifestyle modifications. Monitor appropriate labs.  4. Vitamin D Deficiency - Continue supplementation.  55. NASH, Hx/o Hepatoma s/p RFA (01/2013)- recheck HFP  Recommended regular exercise, BP monitoring, weight control, and discussed med and SE's. Recommended labs to assess and monitor clinical status. Further disposition pending results of labs.

## 2013-09-15 ENCOUNTER — Ambulatory Visit: Payer: Self-pay | Admitting: Internal Medicine

## 2013-09-20 ENCOUNTER — Ambulatory Visit (INDEPENDENT_AMBULATORY_CARE_PROVIDER_SITE_OTHER): Payer: Medicare Other | Admitting: Podiatry

## 2013-09-20 ENCOUNTER — Telehealth: Payer: Self-pay | Admitting: *Deleted

## 2013-09-20 ENCOUNTER — Encounter: Payer: Self-pay | Admitting: Podiatry

## 2013-09-20 VITALS — BP 129/68 | HR 63 | Resp 18

## 2013-09-20 DIAGNOSIS — M79609 Pain in unspecified limb: Secondary | ICD-10-CM

## 2013-09-20 DIAGNOSIS — B351 Tinea unguium: Secondary | ICD-10-CM

## 2013-09-20 NOTE — Telephone Encounter (Signed)
Spoke with pt and reviewed labs  

## 2013-09-20 NOTE — Progress Notes (Signed)
   Subjective:    Patient ID: [name], female    DOB: 1927-11-02, 79 y.o.   MRN: [id number]  HPI I need my nails trimmed up and I am suppose to be wearing my compression stockings and my nails are too long and I was in the hospital last week and it was my potassium was too low and BP medicine was too high and they cut that pill down and laser on my left leg about 5 weeks ago  Last visit for this service was 03/04/2012. Patient has been a patient in this practice since approximately 2004   Review of Systems  Constitutional: Negative.   HENT: Negative.   Eyes: Negative.   Respiratory: Negative.   Cardiovascular: Negative.   Gastrointestinal: Negative.   Endocrine: Positive for cold intolerance and heat intolerance.  Genitourinary: Negative.   Musculoskeletal: Negative.   Skin: Negative.   Allergic/Immunologic: Negative.   Neurological: Negative.   Hematological: Bruises/bleeds easily.  Psychiatric/Behavioral: Negative.        Objective:   Physical Exam  Orientated x3 white female  Elongated, discolored, hypertrophic toenails x5        Assessment & Plan:   Assessment: Symptomatic onychomycoses 1 through 5  Plan: Symptomatic nails x5 debrided back without any bleeding. Reappoint at three-month intervals or at patient's request.

## 2013-10-24 ENCOUNTER — Encounter: Payer: Self-pay | Admitting: Physician Assistant

## 2013-10-24 DIAGNOSIS — E559 Vitamin D deficiency, unspecified: Secondary | ICD-10-CM | POA: Insufficient documentation

## 2013-10-24 DIAGNOSIS — G2581 Restless legs syndrome: Secondary | ICD-10-CM | POA: Insufficient documentation

## 2013-10-26 ENCOUNTER — Ambulatory Visit (INDEPENDENT_AMBULATORY_CARE_PROVIDER_SITE_OTHER): Payer: Medicare Other | Admitting: Physician Assistant

## 2013-10-26 ENCOUNTER — Encounter: Payer: Self-pay | Admitting: Physician Assistant

## 2013-10-26 VITALS — BP 138/70 | HR 72 | Temp 97.9°F | Resp 16 | Ht 66.0 in | Wt 190.0 lb

## 2013-10-26 DIAGNOSIS — R0600 Dyspnea, unspecified: Secondary | ICD-10-CM

## 2013-10-26 DIAGNOSIS — I10 Essential (primary) hypertension: Secondary | ICD-10-CM

## 2013-10-26 DIAGNOSIS — K7460 Unspecified cirrhosis of liver: Secondary | ICD-10-CM

## 2013-10-26 DIAGNOSIS — E785 Hyperlipidemia, unspecified: Secondary | ICD-10-CM

## 2013-10-26 DIAGNOSIS — G2581 Restless legs syndrome: Secondary | ICD-10-CM

## 2013-10-26 DIAGNOSIS — D696 Thrombocytopenia, unspecified: Secondary | ICD-10-CM

## 2013-10-26 DIAGNOSIS — D509 Iron deficiency anemia, unspecified: Secondary | ICD-10-CM

## 2013-10-26 DIAGNOSIS — E559 Vitamin D deficiency, unspecified: Secondary | ICD-10-CM

## 2013-10-26 DIAGNOSIS — E119 Type 2 diabetes mellitus without complications: Secondary | ICD-10-CM

## 2013-10-26 LAB — HEMOGLOBIN A1C
Hgb A1c MFr Bld: 5.7 % — ABNORMAL HIGH (ref <5.7)
MEAN PLASMA GLUCOSE: 117 mg/dL — AB (ref <117)

## 2013-10-26 MED ORDER — ALPRAZOLAM 1 MG PO TABS: 0.5000 mg | ORAL_TABLET | Freq: Every evening | ORAL | Status: DC | PRN

## 2013-10-26 MED ORDER — AZELASTINE HCL 0.1 % NA SOLN: 2.0000 | Freq: Every day | NASAL | Status: DC

## 2013-10-26 MED ORDER — ATENOLOL 50 MG PO TABS: 50.0000 mg | ORAL_TABLET | Freq: Every day | ORAL | Status: DC

## 2013-10-26 MED ORDER — AMOXICILLIN-POT CLAVULANATE 875-125 MG PO TABS: 1.0000 | ORAL_TABLET | Freq: Two times a day (BID) | ORAL | Status: AC

## 2013-10-26 NOTE — Progress Notes (Signed)
HPI 80 y.o. female  presents for 3 month follow up with hypertension, hyperlipidemia, diabetes and vitamin D. Her blood pressure has been controlled at home, today their BP is BP: 138/70 mmHg She denies chest pain, shortness of breath, dizziness.  Her cholesterol is diet controlled. Her cholesterol is controlled. The cholesterol last visit was:  LDL 77.  She has been working on diet and exercise for Diabetes, and denies blurry vision, polydipsia, polyphagia and polyuria. Last A1C in the office was: A1C 5.9.  Had recent vein surgery on her left leg with Dr. Kellie Simmering 10 weeks ago, still with swelling, redness and tenderness. She has not been elevating well, does have bed the elevates but just sits during the day. She has bilateral leg swelling, some SOB with exertion, + orthopnea and PND and + weight gain.  Wt Readings from Last 3 Encounters:  10/26/13 190 lb (86.183 kg)  09/10/13 186 lb 9.6 oz (84.641 kg)  09/05/13 182 lb 12.2 oz (82.9 kg)   Patient is on Vitamin D supplement.   Current Medications:  Current Outpatient Prescriptions on File Prior to Visit  Medication Sig Dispense Refill  . ALPRAZolam (XANAX) 1 MG tablet Take 0.5 mg by mouth at bedtime as needed for sleep or anxiety.      Marland Kitchen atenolol (TENORMIN) 50 MG tablet Take 1 tablet (50 mg total) by mouth daily before breakfast.  30 tablet  1  . bumetanide (BUMEX) 2 MG tablet Take 2 mg by mouth daily as needed (edema).      . cholecalciferol (VITAMIN D) 1000 UNITS tablet Take 1,000 Units by mouth daily.      . cyclobenzaprine (FLEXERIL) 10 MG tablet Take 10 mg by mouth 2 (two) times daily as needed. MUSCLE SPASM.      Marland Kitchen NITROSTAT 0.4 MG SL tablet Place 0.4 mg under the tongue every 5 (five) minutes as needed for chest pain.       Marland Kitchen omeprazole (PRILOSEC) 40 MG capsule Take 40 mg by mouth daily as needed.       . triamcinolone cream (KENALOG) 0.1 % Apply 1 application topically 2 (two) times daily as needed. Eczema      . [DISCONTINUED]  fluvastatin XL (LESCOL XL) 80 MG 24 hr tablet Take 80 mg by mouth daily.         No current facility-administered medications on file prior to visit.   Medical History:  Past Medical History  Diagnosis Date  . Anemia, iron deficiency 08/07/2011  . Angiodysplasia of intestinal tract 08/07/2011  . Hypertension   . Arthritis   . Diverticulitis   . Phlebitis   . Macular degeneration   . TIA (transient ischemic attack)   . MI (myocardial infarction)   . Hiatal hernia   . AVM (arteriovenous malformation) of colon with hemorrhage   . Varicose veins of legs   . Cataract   . GERD (gastroesophageal reflux disease)   . DJD (degenerative joint disease)   . Diabetes mellitus     borderline  . History of blood transfusion   . PONV (postoperative nausea and vomiting)   . hepatocellar ca dx'd 08/2012  . Edema leg   . Fatty liver   . Vitamin D deficiency    Allergies:  Allergies  Allergen Reactions  . Avelox [Moxifloxacin Hcl In Nacl] Anaphylaxis  . Sulfa Antibiotics Hives  . Ace Inhibitors   . Doxycycline   . Fenofibrate     constipation  . Ardyth Harps Antonieta Pert Dextran]  IV IRON  . Infergen [Interferon Alfacon-1]   . Statins     Liver function elevations  . Ciprofloxacin Other (See Comments)    Complains of sore mouth    ROS Constitutional: Denies fever, chills, headaches, insomnia, fatigue, night sweats Eyes: Denies redness, blurred vision, diplopia, discharge, itchy, watery eyes.  ENT: Post nasal drip  Denies congestion,  sore throat, earache, dental pain, Tinnitus, Vertigo, Sinus pain, snoring.  Cardio: + edema bilateral legs, redness and some warmth left leg, dyspnea, orthopnea, PND,  Denies chest pain, palpitations, irregular heartbeat,  diaphoresis, claudication Respiratory: denies cough, shortness of breath, wheezing.  Gastrointestinal: Denies dysphagia, heartburn, AB pain/ cramps, N/V, diarrhea, constipation, hematemesis, melena, hematochezia,  hemorrhoids Genitourinary:  Denies dysuria, frequency, urgency, nocturia, hesitancy, discharge, hematuria, flank pain Musculoskeletal: Denies myalgia, stiffness, pain, swelling and strain/sprain. Skin: Denies pruritis, rash, changing in skin lesion Neuro: Denies Weakness, tremor, incoordination, spasms, pain Psychiatric: Denies confusion, memory loss, sensory loss Endocrine: Denies change in weight, skin, hair change, nocturia Diabetic Polys, Denies visual blurring, hyper /hypo glycemic episodes, and paresthesia, Heme/Lymph: Denies Excessive bleeding, bruising, enlarged lymph nodes  Family history- Review and unchanged Social history- Review and unchanged Physical Exam: Filed Vitals:   10/26/13 1504  BP: 138/70  Pulse: 72  Temp: 97.9 F (36.6 C)  Resp: 16   Filed Weights   10/26/13 1504  Weight: 190 lb (86.183 kg)   Wt Readings from Last 3 Encounters:  10/26/13 190 lb (86.183 kg)  09/10/13 186 lb 9.6 oz (84.641 kg)  09/05/13 182 lb 12.2 oz (82.9 kg)   General Appearance: Well nourished, in no apparent distress. Eyes: PERRLA, EOMs, conjunctiva no swelling or erythema Sinuses: No Frontal/maxillary tenderness ENT/Mouth: Ext aud canals clear, TMs without erythema, bulging. No erythema, swelling, or exudate on post pharynx.  Tonsils not swollen or erythematous. Hearing normal.  Neck: Supple, thyroid normal.  Respiratory: Respiratory effort normal, BS equal bilaterally with wheezing/?rales at bilateral bases out rhonchi, or stridor.  Cardio: RRR with 2/6 holosystolic murmur. Brisk peripheral pulses with bilateral edema, redness and tenderness left leg  Abdomen: Soft, + BS.  Non tender, no guarding, rebound, hernias, masses. Lymphatics: Non tender without lymphadenopathy.  Musculoskeletal: Full ROM, 5/5 strength, normal gait.  Skin: Warm, dry without rashes, lesions, ecchymosis.  Neuro: Cranial nerves intact. No cerebellar symptoms. Sensation intact.  Psych: Awake and oriented X 3, normal affect, Insight and  Judgment appropriate.   Assessment and Plan:  Hypertension: Continue medication, monitor blood pressure at home.  Continue DASH diet. Cholesterol: Continue diet and exercise. Check cholesterol.  Diabetes-Continue diet and exercise. Check A1C Vitamin D Def- check level and continue medications.  Bilateral leg edema- with dyspnea? Fluid overloaded with weight gain- will check BNP/BMP- increase fluid pill- history of recent normal echo 2014.  ? Cellulitis/infection- Augmentin 875 BID for 10 days- emphasized that she needs to elevated 3 times a day above her heart.   OVER 40 minutes of exam, counseling, chart review Continue diet and meds as discussed. Further disposition pending results of labs. Discussed med's effects and SE's.    Vicie Mutters 3:23 PM

## 2013-10-26 NOTE — Patient Instructions (Signed)
Bad carbs also include fruit juice, alcohol, and sweet tea. These are empty calories that do not signal to your brain that you are full.   Please remember the good carbs are still carbs which convert into sugar. So please measure them out no more than 1/2-1 cup of rice, oatmeal, pasta, and beans.  Veggies are however free foods! Pile them on.   I like lean protein at every meal such as chicken, [place], pork chops, cottage cheese, etc. Just do not fry these meats and please center your meal around vegetable, the meats should be a side dish.   No all fruit is created equal. Please see the list below, the fruit at the bottom is higher in sugars than the fruit at the top   Edema Edema is an abnormal build-up of fluids in tissues. Because this is partly dependent on gravity (water flows to the lowest place), it is more common in the legs and thighs (lower extremities). It is also common in the looser tissues, like around the eyes. Painless swelling of the feet and ankles is common and increases as a person ages. It may affect both legs and may include the calves or even thighs. When squeezed, the fluid may move out of the affected area and may leave a dent for a few moments. CAUSES   Prolonged standing or sitting in one place for extended periods of time. Movement helps pump tissue fluid into the veins, and absence of movement prevents this, resulting in edema.  Varicose veins. The valves in the veins do not work as well as they should. This causes fluid to leak into the tissues.  Fluid and salt overload.  Injury, burn, or surgery to the leg, ankle, or foot, may damage veins and allow fluid to leak out.  Sunburn damages vessels. Leaky vessels allow fluid to go out into the sunburned tissues.  Allergies (from insect bites or stings, medications or chemicals) cause swelling by allowing vessels to become leaky.  Protein in the blood helps keep fluid in your vessels. Low protein, as in  malnutrition, allows fluid to leak out.  Hormonal changes, including pregnancy and menstruation, cause fluid retention. This fluid may leak out of vessels and cause edema.  Medications that cause fluid retention. Examples are sex hormones, blood pressure medications, steroid treatment, or anti-depressants.  Some illnesses cause edema, especially heart failure, kidney disease, or liver disease.  Surgery that cuts veins or lymph nodes, such as surgery done for the heart or for breast cancer, may result in edema. DIAGNOSIS  Your caregiver is usually easily able to determine what is causing your swelling (edema) by simply asking what is wrong (getting a history) and examining you (doing a physical). Sometimes x-rays, EKG (electrocardiogram or heart tracing), and blood work may be done to evaluate for underlying medical illness. TREATMENT  General treatment includes:  Leg elevation (or elevation of the affected body part).  Restriction of fluid intake.  Prevention of fluid overload.  Compression of the affected body part. Compression with elastic bandages or support stockings squeezes the tissues, preventing fluid from entering and forcing it back into the blood vessels.  Diuretics (also called water pills or fluid pills) pull fluid out of your body in the form of increased urination. These are effective in reducing the swelling, but can have side effects and must be used only under your caregiver's supervision. Diuretics are appropriate only for some types of edema. The specific treatment can be directed at any underlying  causes discovered. Heart, liver, or kidney disease should be treated appropriately. HOME CARE INSTRUCTIONS   Elevate the legs (or affected body part) above the level of the heart, while lying down. 2-3 times a day 20-30 mins  Avoid sitting or standing still for prolonged periods of time.  Avoid putting anything directly under the knees when lying down, and do not wear  constricting clothing or garters on the upper legs.  Exercising the legs causes the fluid to work back into the veins and lymphatic channels. This may help the swelling go down.  The pressure applied by elastic bandages or support stockings can help reduce ankle swelling.  A low-salt diet may help reduce fluid retention and decrease the ankle swelling.  Take any medications exactly as prescribed. SEEK MEDICAL CARE IF:  Your edema is not responding to recommended treatments. SEEK IMMEDIATE MEDICAL CARE IF:   You develop shortness of breath or chest pain.  You cannot breathe when you lay down; or if, while lying down, you have to get up and go to the window to get your breath.  You are having increasing swelling without relief from treatment.  You develop a fever over 102 F (38.9 C).  You develop pain or redness in the areas that are swollen.  Tell your caregiver right away if you have gained 03 lb/1.4 kg in 1 day or 05 lb/2.3 kg in a week. MAKE SURE YOU:   Understand these instructions.  Will watch your condition.  Will get help right away if you are not doing well or get worse. Document Released: 09/09/2005 Document Revised: 03/10/2012 Document Reviewed: 04/27/2008 [facility] Patient Information 2014 [place].

## 2013-10-27 LAB — CBC WITH DIFFERENTIAL/PLATELET
BASOS PCT: 1 % (ref 0–1)
Basophils Absolute: 0 10*3/uL (ref 0.0–0.1)
EOS ABS: 0.3 10*3/uL (ref 0.0–0.7)
Eosinophils Relative: 5 % (ref 0–5)
HCT: 41.8 % (ref 36.0–46.0)
Hemoglobin: 14 g/dL (ref 12.0–15.0)
Lymphocytes Relative: 37 % (ref 12–46)
Lymphs Abs: 1.8 10*3/uL (ref 0.7–4.0)
MCH: 32.3 pg (ref 26.0–34.0)
MCHC: 33.5 g/dL (ref 30.0–36.0)
MCV: 96.3 fL (ref 78.0–100.0)
Monocytes Absolute: 0.7 10*3/uL (ref 0.1–1.0)
Monocytes Relative: 13 % — ABNORMAL HIGH (ref 3–12)
Neutro Abs: 2.2 10*3/uL (ref 1.7–7.7)
Neutrophils Relative %: 44 % (ref 43–77)
PLATELETS: 134 10*3/uL — AB (ref 150–400)
RBC: 4.34 MIL/uL (ref 3.87–5.11)
RDW: 13.7 % (ref 11.5–15.5)
WBC: 5 10*3/uL (ref 4.0–10.5)

## 2013-10-27 LAB — LIPID PANEL
CHOLESTEROL: 126 mg/dL (ref 0–200)
HDL: 40 mg/dL (ref >39)
LDL Cholesterol: 69 mg/dL (ref 0–99)
TRIGLYCERIDES: 84 mg/dL (ref <150)
Total CHOL/HDL Ratio: 3.2 Ratio
VLDL: 17 mg/dL (ref 0–40)

## 2013-10-27 LAB — BASIC METABOLIC PANEL WITH GFR
BUN: 11 mg/dL (ref 6–23)
CALCIUM: 9.9 mg/dL (ref 8.4–10.5)
CO2: 26 mEq/L (ref 19–32)
CREATININE: 0.82 mg/dL (ref 0.50–1.10)
Chloride: 106 mEq/L (ref 96–112)
GFR, EST AFRICAN AMERICAN: 77 mL/min
GFR, Est Non African American: 66 mL/min
GLUCOSE: 109 mg/dL — AB (ref 70–99)
Potassium: 4.5 mEq/L (ref 3.5–5.3)
SODIUM: 140 meq/L (ref 135–145)

## 2013-10-27 LAB — HEPATIC FUNCTION PANEL
ALT: 33 U/L (ref 0–35)
AST: 58 U/L — ABNORMAL HIGH (ref 0–37)
Albumin: 3.3 g/dL — ABNORMAL LOW (ref 3.5–5.2)
Alkaline Phosphatase: 130 U/L — ABNORMAL HIGH (ref 39–117)
BILIRUBIN DIRECT: 0.2 mg/dL (ref 0.0–0.3)
Indirect Bilirubin: 0.7 mg/dL (ref 0.2–1.2)
Total Bilirubin: 0.9 mg/dL (ref 0.2–1.2)
Total Protein: 6.7 g/dL (ref 6.0–8.3)

## 2013-10-27 LAB — MAGNESIUM: MAGNESIUM: 1.8 mg/dL (ref 1.5–2.5)

## 2013-10-27 LAB — TSH: TSH: 2.15 u[IU]/mL (ref 0.350–4.500)

## 2013-10-27 LAB — BRAIN NATRIURETIC PEPTIDE: BRAIN NATRIURETIC PEPTIDE: 115 pg/mL — AB (ref 0.0–100.0)

## 2013-10-27 LAB — INSULIN, FASTING: Insulin fasting, serum: 39 u[IU]/mL — ABNORMAL HIGH (ref 3–28)

## 2013-10-29 ENCOUNTER — Telehealth: Payer: Self-pay

## 2013-10-29 NOTE — Telephone Encounter (Signed)
Called patient to discuss labs as these were released to [facility] and to verify that this patient had asked for mychart, per patient she declines mychart and has no computer or a way to access it. I went over labs with her and she verbalized understanding of all labs, also states that she will call and schedule a next available appointment with GI Dr. Watt Climes. Faxed labs to his office today.

## 2013-11-04 ENCOUNTER — Other Ambulatory Visit: Payer: Self-pay

## 2013-11-04 ENCOUNTER — Other Ambulatory Visit: Payer: Self-pay | Admitting: Physician Assistant

## 2013-11-04 ENCOUNTER — Encounter: Payer: Self-pay | Admitting: Physician Assistant

## 2013-11-04 ENCOUNTER — Ambulatory Visit (HOSPITAL_COMMUNITY)
Admission: RE | Admit: 2013-11-04 | Discharge: 2013-11-04 | Disposition: A | Discharge status: Home / Self Care | Payer: Medicare Other | Source: Ambulatory Visit | Attending: Physician Assistant | Admitting: Physician Assistant

## 2013-11-04 ENCOUNTER — Ambulatory Visit (INDEPENDENT_AMBULATORY_CARE_PROVIDER_SITE_OTHER): Payer: Medicare Other | Admitting: Physician Assistant

## 2013-11-04 VITALS — BP 122/68 | HR 68 | Temp 98.1°F | Resp 16 | Ht 66.0 in | Wt 188.0 lb

## 2013-11-04 DIAGNOSIS — R059 Cough, unspecified: Secondary | ICD-10-CM | POA: Insufficient documentation

## 2013-11-04 DIAGNOSIS — R05 Cough: Secondary | ICD-10-CM | POA: Insufficient documentation

## 2013-11-04 DIAGNOSIS — J90 Pleural effusion, not elsewhere classified: Secondary | ICD-10-CM

## 2013-11-04 DIAGNOSIS — R0600 Dyspnea, unspecified: Secondary | ICD-10-CM

## 2013-11-04 DIAGNOSIS — K449 Diaphragmatic hernia without obstruction or gangrene: Secondary | ICD-10-CM | POA: Insufficient documentation

## 2013-11-04 DIAGNOSIS — R0989 Other specified symptoms and signs involving the circulatory and respiratory systems: Secondary | ICD-10-CM

## 2013-11-04 DIAGNOSIS — R0602 Shortness of breath: Secondary | ICD-10-CM | POA: Insufficient documentation

## 2013-11-04 DIAGNOSIS — Z01812 Encounter for preprocedural laboratory examination: Secondary | ICD-10-CM

## 2013-11-04 DIAGNOSIS — R0609 Other forms of dyspnea: Secondary | ICD-10-CM

## 2013-11-04 DIAGNOSIS — D699 Hemorrhagic condition, unspecified: Secondary | ICD-10-CM

## 2013-11-04 LAB — PROTIME-INR
INR: 1.25 (ref <1.50)
Prothrombin Time: 15.5 seconds — ABNORMAL HIGH (ref 11.6–15.2)

## 2013-11-04 LAB — APTT: APTT: 33 s (ref 24–37)

## 2013-11-04 NOTE — Progress Notes (Signed)
Subjective:    Patient ID: [name], female    DOB: May 25, 1928, 80 y.o.   MRN: [id number]  HPI Patient was seen in the office on 10/26/2013 for 3 month follow up and at that time she complained of bilateral leg swelling, some SOB with exertion,  orthopnea and PND, BNP was 115 at that time. She has had a right pleural effusion with thoracentesis in the past and states it feels similar, unable to lie on her right side, when walking up the stairs to her dentist this past week she was unable to get to the top due to SOB. She states she has felt very week, unable to get out of bed, nonproductive cough. Still has occasional RUQ pain.   FINDINGS:  Moderate to large hiatal hernia with air-fluid level today. New  right pleural effusion with confluent right basilar opacity. Pleural  effusion volume felt to be small when accounting for underlying mild  chronic elevation of the right hemidiaphragm. No pneumothorax or  pulmonary edema. Stable cardiac size and mediastinal contours.  Visualized tracheal air column is within normal limits. No acute  osseous abnormality identified.  IMPRESSION:  1. New small right pleural effusion.  2. Chronic moderate to large hiatal hernia.   Wt Readings from Last 3 Encounters:  11/04/13 188 lb (85.276 kg)  10/26/13 190 lb (86.183 kg)  09/10/13 186 lb 9.6 oz (84.641 kg)   Lab Results  Component Value Date   WBC 5.0 10/26/2013   HGB 14.0 10/26/2013   HCT 41.8 10/26/2013   MCV 96.3 10/26/2013   PLT 134* 10/26/2013   Lab Results  Component Value Date   CREATININE 0.82 10/26/2013   BUN 11 10/26/2013   NA 140 10/26/2013   K 4.5 10/26/2013   CL 106 10/26/2013   CO2 26 10/26/2013   Current Outpatient Prescriptions on File Prior to Visit  Medication Sig Dispense Refill  . ALPRAZolam (XANAX) 1 MG tablet Take 0.5 tablets (0.5 mg total) by mouth at bedtime as needed for sleep or anxiety.  90 tablet  3  . amoxicillin-clavulanate (AUGMENTIN) 875-125 MG per tablet Take 1 tablet by  mouth 2 (two) times daily.  14 tablet  0  . atenolol (TENORMIN) 50 MG tablet Take 1 tablet (50 mg total) by mouth daily before breakfast.  30 tablet  1  . azelastine (ASTELIN) 137 MCG/SPRAY nasal spray Place 2 sprays into both nostrils at bedtime. Use in each nostril as directed  30 mL  2  . bumetanide (BUMEX) 2 MG tablet Take 2 mg by mouth daily as needed (edema).      . cholecalciferol (VITAMIN D) 1000 UNITS tablet Take 1,000 Units by mouth daily.      . cyclobenzaprine (FLEXERIL) 10 MG tablet Take 10 mg by mouth 2 (two) times daily as needed. MUSCLE SPASM.      Marland Kitchen NITROSTAT 0.4 MG SL tablet Place 0.4 mg under the tongue every 5 (five) minutes as needed for chest pain.       Marland Kitchen omeprazole (PRILOSEC) 40 MG capsule Take 40 mg by mouth daily as needed.       . triamcinolone cream (KENALOG) 0.1 % Apply 1 application topically 2 (two) times daily as needed. Eczema      . [DISCONTINUED] fluvastatin XL (LESCOL XL) 80 MG 24 hr tablet Take 80 mg by mouth daily.         No current facility-administered medications on file prior to visit.   Past Medical History  Diagnosis Date  . Anemia, iron deficiency 08/07/2011  . Angiodysplasia of intestinal tract 08/07/2011  . Hypertension   . Arthritis   . Diverticulitis   . Phlebitis   . Macular degeneration   . TIA (transient ischemic attack)   . MI (myocardial infarction)   . Hiatal hernia   . AVM (arteriovenous malformation) of colon with hemorrhage   . Varicose veins of legs   . Cataract   . GERD (gastroesophageal reflux disease)   . DJD (degenerative joint disease)   . Diabetes mellitus     borderline  . History of blood transfusion   . PONV (postoperative nausea and vomiting)   . hepatocellar ca dx'd 08/2012  . Edema leg   . Fatty liver   . Vitamin D deficiency     Review of Systems  Constitutional: Positive for activity change (decrease activity), appetite change (decrease appetitie) and fatigue. Negative for fever and chills.  HENT:  Negative.   Respiratory: Positive for cough and shortness of breath. Negative for apnea, choking, chest tightness and wheezing.   Cardiovascular: Positive for leg swelling. Negative for chest pain and palpitations.  Gastrointestinal: Positive for abdominal pain (RUQ). Negative for nausea, vomiting, diarrhea, constipation, blood in stool and abdominal distention.  Genitourinary: Negative.   Musculoskeletal: Positive for myalgias.  Skin: Negative.   Neurological: Negative.   Hematological: Negative.        Objective:   Physical Exam  Constitutional: She is oriented to person, place, and time. She appears well-developed and well-nourished.  HENT:  Head: Normocephalic and atraumatic.  Right Ear: External ear normal.  Left Ear: External ear normal.  Eyes: Conjunctivae and EOM are normal. Pupils are equal, round, and reactive to light.  Neck: Normal range of motion. Neck supple.  Cardiovascular: Normal rate, S1 normal and S2 normal.  An irregular rhythm present.  Murmur heard.  Systolic murmur is present with a grade of 3/6  Pulmonary/Chest:  Bilateral decreased breath sounds but worse on the right side to about 1/3-1/2 up right lung. Rales on right lung base.   Abdominal: Soft. Bowel sounds are normal. She exhibits distension. She exhibits no mass. There is tenderness (RUQ). There is no rebound and no guarding.  Musculoskeletal: Normal range of motion. She exhibits edema.  Lymphadenopathy:    She has no cervical adenopathy.  Neurological: She is alert and oriented to person, place, and time.  Skin: Skin is warm and dry. No rash noted.      Assessment & Plan:  SOB- re accumulation of right sided pleural effusion  We will increase her Bumex to two pills for the time being which may take care of it since it is small right sided.    We will try to get her an appointment to Interventional radiology for a thoracentesis, i have told her that this may not be needed due to small size but she  would like the appointment   Appointment is for Tuesday at [facility]  Pending results we may have her see Dr.Van Trigt  Her SPO2 is 98% in the office  EKG shows sinus arrythmia but no ST changes  She is in no acute distress but very anxious and lives alone.   She has a follow up with her GI doctor on the 23rd of this month for follow up of her Hepatocellular carcinoma.   She will go to the ER if the SOB, CP, weakness gets worse.  OVER 40 minutes of exam, counseling, chart review, referral performed

## 2013-11-04 NOTE — Patient Instructions (Signed)
Increase Bumex to one in the morning, one at lunch  Pleural Effusion The lining covering your lungs and the inside of your chest is called the pleura. Usually, the space between the 2 pleura contains no air and only a thin layer of fluid. A pleural effusion is an abnormal buildup of fluid in the pleural space. Fluid gathers when there is increased pressure in the lung vessels. This forces fluids out of the lungs and into the pleural space. Vessels may also leak fluids when there are infections, such as pneumonia, or other causes of soreness and redness (inflammation). Fluids leak into the lungs when protein in the blood is low or when certain vessels (lymphatics) are blocked. Finding a pleural effusion is important because it is usually caused by another disease. In order to treat a pleural effusion, your caregiver needs to find its cause. If left untreated, a large amount of fluid can build up and cause collapse of the lung. CAUSES   Heart failure.  Infections (pneumonia, tuberculosis), pulmonary embolism, pulmonary infarction.  Cancer (primary lung and metastatic), asbestosis.  Liver failure (cirrhosis).  Nephrotic syndrome, peritoneal dialysis, kidney problems (uremia).  Collagen vascular disease (systemic lupus erythematosis, rheumatoid arthritis).  Injury (trauma) to the chest or rupture of the digestive tube (esophagus).  Material in the chest or pleural space (hemothorax, chylothorax).  Pancreatitis.  Surgery.  Drug reactions. SYMPTOMS  A pleural effusion can decrease the amount of space available for breathing and make you short of breath. The fluid can become infected, which may cause pain and fever. Often, the pain is worse when taking a deep breath. The underlying disease (heart failure, pneumonia, blood clot, tuberculosis, cancer) may also cause symptoms. DIAGNOSIS   Your caregiver can usually tell what is wrong by talking to you (taking a history), doing an exam, and  taking a routine X-ray. If the X-ray shows fluid in your chest, often fluid is removed from your chest with a needle for testing (diagnostic thoracentesis).  Sometimes, more specialized X-rays may be needed.  Sometimes, a small piece of tissue is removed and examined by a specialist (biopsy). TREATMENT  Treatment varies based on what caused the pleural effusion. Treatments include:  Removing as much fluid as possible using a needle (thoracentesis) to improve the cough and shortness of breath. This is a simple procedure which can be done at bedside. The risks are bleeding, infection, collapse of a lung, or low blood pressure.  Placing a tube in the chest to drain the effusion (tube thoracostomy). This is often used when there is an infection in the fluid. This is a simple procedure which can often be done at bedside or in a clinic. The procedure may be painful. The risks are the same as using a needle to drain the fluid. The chest tube usually remains for a few days and is connected to suction to improve fluid drainage. The tube, after placement, usually does not cause much discomfort.  Surgical removal of fibrous debris in and around the pleural space (decortication). This may be done with a flexible telescope (thoracoscope) through a small or large cut (incision). This is helpful for patients who have fibrosis or scar tissue that prevents complete lung expansion. The risks are infection, blood loss, and side effects from general anesthesia.  Sometimes, a procedure called pleurodesis is done. A chest tube is placed and the fluid is drained. Next, an agent (tetracycline, talc powder) is added to the pleural space. This causes the lung and chest wall  to stick together (adhesion). This leaves no potential space for fluid to build up. The risks include infection, blood loss, and side effects from general anesthesia.  If the effusion is caused by infection, it may be treated with antibiotics and improve  without draining. HOME CARE INSTRUCTIONS   Take any medicines exactly as prescribed.  Follow up with your caregiver as directed.  Monitor your exercise capacity (the amount of walking you can do before you get short of breath).  Do not smoke. Ask your caregiver for help quitting. SEEK MEDICAL CARE IF:   Your exercise capacity seems to get worse or does not improve with time.  You do not recover from your illness. SEEK IMMEDIATE MEDICAL CARE IF:   Shortness of breath or chest pain develops or gets worse.  You have an oral temperature above 102 F (38.9 C), not controlled by medicine.  You develop a new cough, especially if the mucus (phlegm) is discolored. MAKE SURE YOU:   Understand these instructions.  Will watch your condition.  Will get help right away if you are not doing well or get worse. Document Released: 09/09/2005 Document Revised: 06/30/2013 Document Reviewed: 05/01/2007 [facility] Patient Information 2014 [place].

## 2013-11-05 ENCOUNTER — Telehealth: Payer: Self-pay

## 2013-11-05 LAB — CBC WITH DIFFERENTIAL/PLATELET
Basophils Absolute: 0 10*3/uL (ref 0.0–0.1)
Basophils Relative: 1 % (ref 0–1)
Eosinophils Absolute: 0.3 10*3/uL (ref 0.0–0.7)
Eosinophils Relative: 4 % (ref 0–5)
HEMATOCRIT: 42.1 % (ref 36.0–46.0)
HEMOGLOBIN: 14.2 g/dL (ref 12.0–15.0)
LYMPHS ABS: 2.6 10*3/uL (ref 0.7–4.0)
Lymphocytes Relative: 33 % (ref 12–46)
MCH: 32.9 pg (ref 26.0–34.0)
MCHC: 33.7 g/dL (ref 30.0–36.0)
MCV: 97.5 fL (ref 78.0–100.0)
MONO ABS: 0.9 10*3/uL (ref 0.1–1.0)
Monocytes Relative: 11 % (ref 3–12)
Neutro Abs: 4.2 10*3/uL (ref 1.7–7.7)
Neutrophils Relative %: 51 % (ref 43–77)
PLATELETS: 144 10*3/uL — AB (ref 150–400)
RBC: 4.32 MIL/uL (ref 3.87–5.11)
RDW: 13.7 % (ref 11.5–15.5)
WBC: 8 10*3/uL (ref 4.0–10.5)

## 2013-11-05 LAB — BASIC METABOLIC PANEL WITH GFR
BUN: 11 mg/dL (ref 6–23)
CO2: 28 mEq/L (ref 19–32)
CREATININE: 0.79 mg/dL (ref 0.50–1.10)
Calcium: 10.3 mg/dL (ref 8.4–10.5)
Chloride: 107 mEq/L (ref 96–112)
GFR, Est African American: 80 mL/min
GFR, Est Non African American: 69 mL/min
Glucose, Bld: 78 mg/dL (ref 70–99)
Potassium: 4.1 mEq/L (ref 3.5–5.3)
Sodium: 141 mEq/L (ref 135–145)

## 2013-11-05 LAB — HEPATIC FUNCTION PANEL
ALBUMIN: 3.6 g/dL (ref 3.5–5.2)
ALK PHOS: 126 U/L — AB (ref 39–117)
ALT: 29 U/L (ref 0–35)
AST: 51 U/L — ABNORMAL HIGH (ref 0–37)
BILIRUBIN TOTAL: 1.1 mg/dL (ref 0.2–1.2)
Bilirubin, Direct: 0.3 mg/dL (ref 0.0–0.3)
Indirect Bilirubin: 0.8 mg/dL (ref 0.2–1.2)
Total Protein: 6.9 g/dL (ref 6.0–8.3)

## 2013-11-05 LAB — BRAIN NATRIURETIC PEPTIDE: Brain Natriuretic Peptide: 84 pg/mL (ref 0.0–100.0)

## 2013-11-05 NOTE — Telephone Encounter (Signed)
Spoke with patients daughter regarding lab results and instructions, per Blanch Media she set up the [facility] and wants to keep it, she states that if she needs to call back and have it deactivated she will but for now wants to keep it active. I let her know that she would not receive a phone call with lab results with the active mychart so she would have to go on line for details she understands that.

## 2013-11-09 ENCOUNTER — Ambulatory Visit (HOSPITAL_COMMUNITY): Payer: Medicare Other

## 2013-11-11 ENCOUNTER — Ambulatory Visit: Payer: Self-pay | Admitting: Physician Assistant

## 2013-11-12 ENCOUNTER — Ambulatory Visit (HOSPITAL_COMMUNITY)
Admission: RE | Admit: 2013-11-12 | Discharge: 2013-11-12 | Disposition: A | Discharge status: Home / Self Care | Payer: Medicare Other | Source: Ambulatory Visit | Attending: Physician Assistant | Admitting: Physician Assistant

## 2013-11-12 ENCOUNTER — Other Ambulatory Visit: Payer: Self-pay | Admitting: Physician Assistant

## 2013-11-12 DIAGNOSIS — R0600 Dyspnea, unspecified: Secondary | ICD-10-CM

## 2013-11-12 DIAGNOSIS — J90 Pleural effusion, not elsewhere classified: Secondary | ICD-10-CM

## 2013-11-12 DIAGNOSIS — R0602 Shortness of breath: Secondary | ICD-10-CM | POA: Insufficient documentation

## 2013-11-15 ENCOUNTER — Other Ambulatory Visit: Payer: Self-pay | Admitting: Gastroenterology

## 2013-11-15 DIAGNOSIS — R109 Unspecified abdominal pain: Secondary | ICD-10-CM

## 2013-11-15 DIAGNOSIS — R7989 Other specified abnormal findings of blood chemistry: Secondary | ICD-10-CM

## 2013-11-15 DIAGNOSIS — R945 Abnormal results of liver function studies: Secondary | ICD-10-CM

## 2013-11-15 DIAGNOSIS — R932 Abnormal findings on diagnostic imaging of liver and biliary tract: Secondary | ICD-10-CM

## 2013-11-19 ENCOUNTER — Inpatient Hospital Stay: Admission: RE | Admit: 2013-11-19 | Payer: Medicare Other | Source: Ambulatory Visit

## 2013-11-19 ENCOUNTER — Ambulatory Visit: Payer: Self-pay | Admitting: Physician Assistant

## 2013-11-25 ENCOUNTER — Encounter (HOSPITAL_COMMUNITY): Payer: Self-pay | Admitting: Emergency Medicine

## 2013-11-25 ENCOUNTER — Telehealth: Payer: Self-pay | Admitting: Hematology & Oncology

## 2013-11-25 ENCOUNTER — Emergency Department (HOSPITAL_COMMUNITY): Payer: Medicare Other

## 2013-11-25 ENCOUNTER — Other Ambulatory Visit: Payer: Self-pay | Admitting: *Deleted

## 2013-11-25 ENCOUNTER — Inpatient Hospital Stay (HOSPITAL_COMMUNITY)
Admission: EM | Admit: 2013-11-25 | Discharge: 2013-11-29 | DRG: 603 | Disposition: A | Discharge status: Skilled Nursing Facility | Payer: Medicare Other | Attending: Internal Medicine | Admitting: Internal Medicine

## 2013-11-25 DIAGNOSIS — K7460 Unspecified cirrhosis of liver: Secondary | ICD-10-CM | POA: Diagnosis present

## 2013-11-25 DIAGNOSIS — Z800 Family history of malignant neoplasm of digestive organs: Secondary | ICD-10-CM

## 2013-11-25 DIAGNOSIS — M1990 Unspecified osteoarthritis, unspecified site: Secondary | ICD-10-CM | POA: Diagnosis present

## 2013-11-25 DIAGNOSIS — Z87891 Personal history of nicotine dependence: Secondary | ICD-10-CM

## 2013-11-25 DIAGNOSIS — K738 Other chronic hepatitis, not elsewhere classified: Secondary | ICD-10-CM | POA: Diagnosis present

## 2013-11-25 DIAGNOSIS — L03115 Cellulitis of right lower limb: Secondary | ICD-10-CM | POA: Diagnosis present

## 2013-11-25 DIAGNOSIS — I10 Essential (primary) hypertension: Secondary | ICD-10-CM | POA: Diagnosis present

## 2013-11-25 DIAGNOSIS — E559 Vitamin D deficiency, unspecified: Secondary | ICD-10-CM | POA: Diagnosis present

## 2013-11-25 DIAGNOSIS — K253 Acute gastric ulcer without hemorrhage or perforation: Secondary | ICD-10-CM

## 2013-11-25 DIAGNOSIS — L02419 Cutaneous abscess of limb, unspecified: Principal | ICD-10-CM | POA: Diagnosis present

## 2013-11-25 DIAGNOSIS — K219 Gastro-esophageal reflux disease without esophagitis: Secondary | ICD-10-CM | POA: Diagnosis present

## 2013-11-25 DIAGNOSIS — D509 Iron deficiency anemia, unspecified: Secondary | ICD-10-CM | POA: Diagnosis present

## 2013-11-25 DIAGNOSIS — M79609 Pain in unspecified limb: Secondary | ICD-10-CM

## 2013-11-25 DIAGNOSIS — M7989 Other specified soft tissue disorders: Secondary | ICD-10-CM

## 2013-11-25 DIAGNOSIS — I252 Old myocardial infarction: Secondary | ICD-10-CM

## 2013-11-25 DIAGNOSIS — K5520 Angiodysplasia of colon without hemorrhage: Secondary | ICD-10-CM

## 2013-11-25 DIAGNOSIS — E119 Type 2 diabetes mellitus without complications: Secondary | ICD-10-CM | POA: Diagnosis present

## 2013-11-25 DIAGNOSIS — L03119 Cellulitis of unspecified part of limb: Principal | ICD-10-CM

## 2013-11-25 DIAGNOSIS — R7309 Other abnormal glucose: Secondary | ICD-10-CM | POA: Diagnosis present

## 2013-11-25 DIAGNOSIS — W19XXXA Unspecified fall, initial encounter: Secondary | ICD-10-CM

## 2013-11-25 DIAGNOSIS — Z8249 Family history of ischemic heart disease and other diseases of the circulatory system: Secondary | ICD-10-CM

## 2013-11-25 DIAGNOSIS — E8809 Other disorders of plasma-protein metabolism, not elsewhere classified: Secondary | ICD-10-CM | POA: Diagnosis present

## 2013-11-25 DIAGNOSIS — H3530 Unspecified macular degeneration: Secondary | ICD-10-CM | POA: Diagnosis present

## 2013-11-25 DIAGNOSIS — Z882 Allergy status to sulfonamides status: Secondary | ICD-10-CM

## 2013-11-25 DIAGNOSIS — Z881 Allergy status to other antibiotic agents status: Secondary | ICD-10-CM

## 2013-11-25 DIAGNOSIS — Z888 Allergy status to other drugs, medicaments and biological substances status: Secondary | ICD-10-CM

## 2013-11-25 DIAGNOSIS — D696 Thrombocytopenia, unspecified: Secondary | ICD-10-CM | POA: Diagnosis present

## 2013-11-25 LAB — CBC WITH DIFFERENTIAL/PLATELET
BASOS PCT: 0 % (ref 0–1)
Basophils Absolute: 0 10*3/uL (ref 0.0–0.1)
EOS ABS: 0.2 10*3/uL (ref 0.0–0.7)
Eosinophils Relative: 4 % (ref 0–5)
HCT: 39.8 % (ref 36.0–46.0)
Hemoglobin: 13.6 g/dL (ref 12.0–15.0)
Lymphocytes Relative: 31 % (ref 12–46)
Lymphs Abs: 1.6 10*3/uL (ref 0.7–4.0)
MCH: 32.9 pg (ref 26.0–34.0)
MCHC: 34.2 g/dL (ref 30.0–36.0)
MCV: 96.4 fL (ref 78.0–100.0)
Monocytes Absolute: 0.6 10*3/uL (ref 0.1–1.0)
Monocytes Relative: 12 % (ref 3–12)
NEUTROS PCT: 53 % (ref 43–77)
Neutro Abs: 2.8 10*3/uL (ref 1.7–7.7)
Platelets: 140 10*3/uL — ABNORMAL LOW (ref 150–400)
RBC: 4.13 MIL/uL (ref 3.87–5.11)
RDW: 13.5 % (ref 11.5–15.5)
WBC: 5.3 10*3/uL (ref 4.0–10.5)

## 2013-11-25 LAB — FOLATE

## 2013-11-25 LAB — COMPREHENSIVE METABOLIC PANEL
ALK PHOS: 128 U/L — AB (ref 39–117)
ALT: 27 U/L (ref 0–35)
AST: 45 U/L — ABNORMAL HIGH (ref 0–37)
Albumin: 2.9 g/dL — ABNORMAL LOW (ref 3.5–5.2)
BUN: 10 mg/dL (ref 6–23)
CHLORIDE: 107 meq/L (ref 96–112)
CO2: 23 meq/L (ref 19–32)
Calcium: 9.5 mg/dL (ref 8.4–10.5)
Creatinine, Ser: 0.81 mg/dL (ref 0.50–1.10)
GFR calc Af Amer: 76 mL/min — ABNORMAL LOW (ref >90)
GFR, EST NON AFRICAN AMERICAN: 65 mL/min — AB (ref >90)
Glucose, Bld: 115 mg/dL — ABNORMAL HIGH (ref 70–99)
Potassium: 4 mEq/L (ref 3.7–5.3)
Sodium: 142 mEq/L (ref 137–147)
Total Bilirubin: 1.4 mg/dL — ABNORMAL HIGH (ref 0.3–1.2)
Total Protein: 6.8 g/dL (ref 6.0–8.3)

## 2013-11-25 LAB — IRON AND TIBC
Iron: 123 ug/dL (ref 42–135)
Saturation Ratios: 46 % (ref 20–55)
TIBC: 269 ug/dL (ref 250–470)
UIBC: 146 ug/dL (ref 125–400)

## 2013-11-25 LAB — VITAMIN B12: VITAMIN B 12: 999 pg/mL — AB (ref 211–911)

## 2013-11-25 LAB — RETICULOCYTES
RBC.: 4.23 MIL/uL (ref 3.87–5.11)
RETIC COUNT ABSOLUTE: 55 10*3/uL (ref 19.0–186.0)
Retic Ct Pct: 1.3 % (ref 0.4–3.1)

## 2013-11-25 LAB — FERRITIN: Ferritin: 114 ng/mL (ref 10–291)

## 2013-11-25 MED ORDER — SODIUM CHLORIDE 0.9 % IJ SOLN: 3.0000 mL | INTRAMUSCULAR | Status: DC | PRN

## 2013-11-25 MED ORDER — NITROGLYCERIN 0.4 MG SL SUBL: 0.4000 mg | SUBLINGUAL_TABLET | SUBLINGUAL | Status: DC | PRN

## 2013-11-25 MED ORDER — PANTOPRAZOLE SODIUM 40 MG PO TBEC
40.0000 mg | DELAYED_RELEASE_TABLET | Freq: Every day | ORAL | Status: DC
  Administered 2013-11-25 – 2013-11-29 (×4): 40 mg via ORAL
  Filled 2013-11-25 (×3): qty 1

## 2013-11-25 MED ORDER — ALPRAZOLAM 0.5 MG PO TABS
0.5000 mg | ORAL_TABLET | Freq: Every evening | ORAL | Status: DC | PRN
  Administered 2013-11-25 – 2013-11-28 (×4): 0.5 mg via ORAL
  Filled 2013-11-25 (×4): qty 1

## 2013-11-25 MED ORDER — SODIUM CHLORIDE 0.9 % IJ SOLN
3.0000 mL | Freq: Two times a day (BID) | INTRAMUSCULAR | Status: DC
  Administered 2013-11-26 – 2013-11-29 (×6): 3 mL via INTRAVENOUS

## 2013-11-25 MED ORDER — ONDANSETRON HCL 4 MG PO TABS: 4.0000 mg | ORAL_TABLET | Freq: Four times a day (QID) | ORAL | Status: DC | PRN

## 2013-11-25 MED ORDER — ENOXAPARIN SODIUM 40 MG/0.4ML ~~LOC~~ SOLN
40.0000 mg | SUBCUTANEOUS | Status: DC
Start: 2013-11-25 — End: 2013-11-29
  Filled 2013-11-25 (×5): qty 0.4

## 2013-11-25 MED ORDER — AZELASTINE HCL 0.1 % NA SOLN
2.0000 | Freq: Every day | NASAL | Status: DC
  Administered 2013-11-25 – 2013-11-27 (×3): 2 via NASAL
  Filled 2013-11-25: qty 30

## 2013-11-25 MED ORDER — CLINDAMYCIN PHOSPHATE 600 MG/50ML IV SOLN
600.0000 mg | Freq: Three times a day (TID) | INTRAVENOUS | Status: DC
  Administered 2013-11-25 – 2013-11-26 (×3): 600 mg via INTRAVENOUS
  Filled 2013-11-25 (×5): qty 50

## 2013-11-25 MED ORDER — HYDROCODONE-ACETAMINOPHEN 5-325 MG PO TABS: 1.0000 | ORAL_TABLET | ORAL | Status: DC | PRN

## 2013-11-25 MED ORDER — CYCLOBENZAPRINE HCL 10 MG PO TABS
10.0000 mg | ORAL_TABLET | Freq: Two times a day (BID) | ORAL | Status: DC | PRN
  Administered 2013-11-25: 10 mg via ORAL
  Filled 2013-11-25: qty 1

## 2013-11-25 MED ORDER — ATENOLOL 50 MG PO TABS
50.0000 mg | ORAL_TABLET | Freq: Every day | ORAL | Status: DC
  Administered 2013-11-26 – 2013-11-29 (×4): 50 mg via ORAL
  Filled 2013-11-25 (×5): qty 1

## 2013-11-25 MED ORDER — CLINDAMYCIN PHOSPHATE 600 MG/50ML IV SOLN
600.0000 mg | Freq: Once | INTRAVENOUS | Status: AC
  Administered 2013-11-25: 600 mg via INTRAVENOUS
  Filled 2013-11-25: qty 50

## 2013-11-25 MED ORDER — SODIUM CHLORIDE 0.9 % IV SOLN: 250.0000 mL | INTRAVENOUS | Status: DC | PRN

## 2013-11-25 MED ORDER — VITAMIN D3 25 MCG (1000 UNIT) PO TABS
1000.0000 [IU] | ORAL_TABLET | Freq: Every day | ORAL | Status: DC
  Administered 2013-11-25 – 2013-11-29 (×5): 1000 [IU] via ORAL
  Filled 2013-11-25 (×5): qty 1

## 2013-11-25 MED ORDER — BUMETANIDE 2 MG PO TABS
2.0000 mg | ORAL_TABLET | Freq: Every day | ORAL | Status: DC | PRN
  Filled 2013-11-25: qty 1

## 2013-11-25 MED ORDER — MORPHINE SULFATE 2 MG/ML IJ SOLN
2.0000 mg | Freq: Once | INTRAMUSCULAR | Status: AC
  Administered 2013-11-25: 2 mg via INTRAVENOUS
  Filled 2013-11-25: qty 1

## 2013-11-25 MED ORDER — MORPHINE SULFATE 2 MG/ML IJ SOLN
1.0000 mg | INTRAMUSCULAR | Status: DC | PRN
  Administered 2013-11-26: 1 mg via INTRAVENOUS
  Filled 2013-11-25: qty 1

## 2013-11-25 MED ORDER — ONDANSETRON HCL 4 MG/2ML IJ SOLN
4.0000 mg | Freq: Four times a day (QID) | INTRAMUSCULAR | Status: DC | PRN
  Administered 2013-11-25: 4 mg via INTRAVENOUS
  Filled 2013-11-25: qty 2

## 2013-11-25 NOTE — ED Provider Notes (Signed)
Patient seen/examined in the Emergency Department in conjunction with Resident Physician Provider [place] Patient reports pain in right leg s/p fall Exam : tenderness/bruising with erythema to right LE.  Distal pulses intact Plan: imaging/labs ordered.  Suspect cellulitis.  Will likely need admit    Sharyon Cable, MD 11/25/13 1018

## 2013-11-25 NOTE — ED Notes (Signed)
Contacted [employer] about wait time. Pt made aware of delay. Meal tray order.

## 2013-11-25 NOTE — ED Notes (Signed)
Patient transported to X-ray 

## 2013-11-25 NOTE — ED Notes (Signed)
Admitting MD at bedside.

## 2013-11-25 NOTE — Progress Notes (Signed)
VASCULAR LAB PRELIMINARY  PRELIMINARY  PRELIMINARY  PRELIMINARY  Right lower extremity venous duplex completed.  Preliminary report: Right:  No evidence of DVT, superficial thrombosis, or Baker's cyst.  Kairi Harshbarger, RVS 11/25/2013, 12:01 PM

## 2013-11-25 NOTE — ED Notes (Signed)
Pt assisted to bedside commode. Pt denies any complaint. Sheets changed, pt repositioned in bed.

## 2013-11-25 NOTE — Progress Notes (Signed)
Received message patient coming to [phone]. Called ED to get report at 1610. Patient recived into room via stretcher at 1640.

## 2013-11-25 NOTE — H&P (Signed)
[facility] History and Physical  [name] [id number] DOB: 1928-06-06 DOA: 11/25/2013  Referring physician: er PCP: Alesia Richards, MD   Chief Complaint: right leg pain  HPI: [name] is a 80 y.o. female  Who fell 1 week ago- she states her leg gave out- denies loss of consciousness/hitting head.  She was able to get up, seen by EMS and knee was wrapped.  Since then, she has had increasing swelling and pain in her right leg.  She can bend her leg and bear weight with some pain.  She is always cold but denies fevers. Long standing appetite issues Has varicose veins and had laser surgery done on left leg. She has iron def anemia and follows with heme onc  In the Er, xrays of ankle and knees were done with out fracture.  WBC count was normal.  DVT study was negative- Hopsitalist were asked to admit for IV abx and pain control  She has a walker at home but was walking with out it when she fell  Review of Systems:  All systems reviewed, negative unless stated above   Past Medical History  Diagnosis Date  . Anemia, iron deficiency 08/07/2011  . Angiodysplasia of intestinal tract 08/07/2011  . Hypertension   . Arthritis   . Diverticulitis   . Phlebitis   . Macular degeneration   . TIA (transient ischemic attack)   . MI (myocardial infarction)   . Hiatal hernia   . AVM (arteriovenous malformation) of colon with hemorrhage   . Varicose veins of legs   . Cataract   . GERD (gastroesophageal reflux disease)   . DJD (degenerative joint disease)   . Diabetes mellitus     borderline  . History of blood transfusion   . PONV (postoperative nausea and vomiting)   . hepatocellar ca dx'd 08/2012  . Edema leg   . Fatty liver   . Vitamin D deficiency    Past Surgical History  Procedure Laterality Date  . 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 Abdominal hysterectomy    . Hemorrhoid surgery    . Esophagogastroduodenoscopy  08/30/2012    Procedure:  ESOPHAGOGASTRODUODENOSCOPY (EGD);  Surgeon: Jeryl Columbia, MD;  Location: [facility];  Service: Endoscopy;  Laterality: N/A;  . Radiofrequency ablation liver tumor Right 01/2013    HFA ablation right liver lesion  . Colonoscopy  06/2009    Dr. Watt Climes, due 5 years  . Endoscopic vein laser treatment Left 2014    Dr. Kellie Simmering   Social History:  reports that she quit smoking about 23 years ago. She has never used smokeless tobacco. She reports that she does not drink alcohol or use illicit drugs.  Allergies  Allergen Reactions  . Avelox [Moxifloxacin Hcl In Nacl] Anaphylaxis  . Sulfa Antibiotics Hives  . Ace Inhibitors   . Doxycycline   . Fenofibrate     constipation  . Infed [Iron Dextran]     IV IRON  . Infergen [Interferon Alfacon-1]   . Statins     Liver function elevations  . Ciprofloxacin Other (See Comments)    Complains of sore mouth    Family History  Problem Relation Age of Onset  . Colon cancer Sister   . Coronary artery disease Father   . Heart disease Father      Prior to Admission medications   Medication Sig Start Date End Date Taking? Authorizing Provider  ALPRAZolam Duanne Moron) 1 MG tablet Take 0.5 tablets (  0.5 mg total) by mouth at bedtime as needed for sleep or anxiety. 10/26/13  Yes Vicie Mutters, PA-C  atenolol (TENORMIN) 50 MG tablet Take 1 tablet (50 mg total) by mouth daily before breakfast. 10/26/13  Yes Vicie Mutters, PA-C  azelastine (ASTELIN) 137 MCG/SPRAY nasal spray Place 2 sprays into both nostrils at bedtime. Use in each nostril as directed 10/26/13 10/26/14 Yes Vicie Mutters, PA-C  bumetanide (BUMEX) 2 MG tablet Take 2 mg by mouth daily as needed (for edema).    Yes Historical Provider, MD  cholecalciferol (VITAMIN D) 1000 UNITS tablet Take 1,000 Units by mouth daily.   Yes Historical Provider, MD  cyclobenzaprine (FLEXERIL) 10 MG tablet Take 10 mg by mouth 2 (two) times daily as needed for muscle spasms.    Yes Marybelle Killings, MD  NITROSTAT 0.4 MG SL tablet  Place 0.4 mg under the tongue every 5 (five) minutes as needed for chest pain.  07/05/11  Yes Historical Provider, MD  omeprazole (PRILOSEC) 40 MG capsule Take 40 mg by mouth daily as needed (for reflux).    Yes Historical Provider, MD  triamcinolone cream (KENALOG) 0.1 % Apply 1 application topically 2 (two) times daily as needed. Eczema 07/09/12  Yes Historical Provider, MD   Physical Exam: Filed Vitals:   11/25/13 0942  BP: 134/57  Pulse: 65  Temp: 97.8 F (36.6 C)  Resp: 22    BP 134/57  Pulse 65  Temp(Src) 97.8 F (36.6 C) (Oral)  Resp 22  SpO2 95%  General:  Appears calm and comfortable Eyes: PERRL, normal lids, irises & conjunctiva ENT: grossly normal hearing, lips & tongue Neck: no LAD, masses or thyromegaly Cardiovascular: RRR, no m/r/g. No LE edema. Telemetry: SR, no arrhythmias  Respiratory: CTA bilaterally, no w/r/r. Normal respiratory effort. Abdomen: soft, ntnd Skin: right leg swollen and erythematous from toes to knees Musculoskeletal: grossly normal tone BUE/BLE Psychiatric: grossly normal mood and affect, speech fluent and appropriate Neurologic: grossly non-focal.          Labs on Admission:  Basic Metabolic Panel:  Recent Labs Lab 11/25/13 0959  NA 142  K 4.0  CL 107  CO2 23  GLUCOSE 115*  BUN 10  CREATININE 0.81  CALCIUM 9.5   Liver Function Tests:  Recent Labs Lab 11/25/13 0959  AST 45*  ALT 27  ALKPHOS 128*  BILITOT 1.4*  PROT 6.8  ALBUMIN 2.9*   No results found for this basename: LIPASE, AMYLASE,  in the last 168 hours No results found for this basename: AMMONIA,  in the last 168 hours CBC:  Recent Labs Lab 11/25/13 0959  WBC 5.3  NEUTROABS 2.8  HGB 13.6  HCT 39.8  MCV 96.4  PLT 140*   Cardiac Enzymes: No results found for this basename: CKTOTAL, CKMB, CKMBINDEX, TROPONINI,  in the last 168 hours  BNP (last 3 results) No results found for this basename: PROBNP,  in the last 8760 hours CBG: No results found for  this basename: GLUCAP,  in the last 168 hours  Radiological Exams on Admission: Dg Knee 2 Views Right  11/25/2013   CLINICAL DATA:  Right leg pain  EXAM: RIGHT KNEE - 1-2 VIEW  COMPARISON:  None.  FINDINGS: There is no evidence of fracture, dislocation, or joint effusion. There is no evidence of arthropathy or other focal bone abnormality. Soft tissues are unremarkable.  IMPRESSION: Negative.   Electronically Signed   By: Franchot Gallo M.D.   On: 11/25/2013 10:49   Dg  Ankle 2 Views Right  11/25/2013   CLINICAL DATA:  Fall.  Pain  EXAM: RIGHT ANKLE - 2 VIEW  COMPARISON:  None.  FINDINGS: Negative for acute fracture.  Normal alignment.  Diffuse soft tissue swelling.  Calcaneal spurring. Irregularity at the base of the fifth metatarsal suggestive of old injury.  IMPRESSION: Negative for acute fracture.   Electronically Signed   By: Franchot Gallo M.D.   On: 11/25/2013 10:49      Assessment/Plan Active Problems:   Anemia, iron deficiency   Hypertension   Diabetes mellitus   Cirrhosis   Cellulitis of right lower extremity   1. Cellulitis- IV clindamycin, elevate legs, U/S for DVT negative 2. Fall- PT eval 3. DM- SSI 4. HTN- resume home meds 5. Cirrhosis- slightly elevated AST- was to have ultrasound or CT as outpatient- family to get that information for [place] 6. Anemia- panel ordered per heme onc    Code Status: full Family Communication: granddaughter at bedside Disposition Plan: admit  Time spent: 53  min  Eulogio Bear [facility] Pager [phone]

## 2013-11-25 NOTE — ED Provider Notes (Signed)
CSN: [id number]     Arrival date & time 11/25/13  2952 History   First MD Initiated Contact with Patient 11/25/13 [phone]     Chief Complaint  Patient presents with  . Leg Pain     (Consider location/radiation/quality/duration/timing/severity/associated sxs/prior Treatment) Patient is a 80 y.o. female presenting with leg pain. The history is provided by the patient and a relative. No language interpreter was used.  Leg Pain Associated symptoms: no fever   This is an 80yo WF w/ extensive PMH (outlined below) significant for DM, HTN, iron deficiency anemia (thought to be 2/2 AVM in GI tract), prior MI, NASH w/ cirrhosis and HCC, bilateral leg varicosities and edema who presents w/ c/o RLE pain and swelling x 1 week.  Patient fell onto her R knee 1 week ago, no LOC and did not hit her head, at which time she was evaluated by EMS. Patient decided not to come to the ED for evaluation at that time. Since her fall she has had increasing pain to her R knee and ankle, increase R leg swelling and redness. Denies pain in her hips or R leg, F/C, SOB, CP, abd pain, N/V/D. She has had to use a walker to ambulate over the past week, at baseline she walks independently. Patient also endorses having generalized weakness over the last few weeks, but denies any focal neurologic s/s.    Past Medical History  Diagnosis Date  . Anemia, iron deficiency 08/07/2011  . Angiodysplasia of intestinal tract 08/07/2011  . Hypertension   . Arthritis   . Diverticulitis   . Phlebitis   . Macular degeneration   . TIA (transient ischemic attack)   . MI (myocardial infarction)   . Hiatal hernia   . AVM (arteriovenous malformation) of colon with hemorrhage   . Varicose veins of legs   . Cataract   . GERD (gastroesophageal reflux disease)   . DJD (degenerative joint disease)   . Diabetes mellitus     borderline  . History of blood transfusion   . PONV (postoperative nausea and vomiting)   . hepatocellar ca dx'd 08/2012   . Edema leg   . Fatty liver   . Vitamin D deficiency    Past Surgical History  Procedure Laterality Date  . 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 Abdominal hysterectomy    . Hemorrhoid surgery    . Esophagogastroduodenoscopy  08/30/2012    Procedure: ESOPHAGOGASTRODUODENOSCOPY (EGD);  Surgeon: Jeryl Columbia, MD;  Location: [facility];  Service: Endoscopy;  Laterality: N/A;  . Radiofrequency ablation liver tumor Right 01/2013    HFA ablation right liver lesion  . Colonoscopy  06/2009    Dr. Watt Climes, due 5 years  . Endoscopic vein laser treatment Left 2014    Dr. Kellie Simmering   Family History  Problem Relation Age of Onset  . Colon cancer Sister   . Coronary artery disease Father   . Heart disease Father    History  Substance Use Topics  . Smoking status: Former Smoker -- 5.00 packs/day for 12 years    Quit date: 07/23/1990  . Smokeless tobacco: Never Used  . Alcohol Use: No   OB History   Grav Para Term Preterm Abortions TAB SAB Ect Mult Living                 Review of Systems  Constitutional: Negative for fever and chills.  Respiratory: Negative for cough and shortness of breath.   Cardiovascular: Positive  for leg swelling. Negative for chest pain and palpitations.  Gastrointestinal: Negative for nausea, vomiting, abdominal pain and diarrhea.  Skin: Positive for color change.  Neurological: Negative for syncope and light-headedness.  All other systems reviewed and are negative.   Allergies  Avelox; Sulfa antibiotics; Ace inhibitors; Doxycycline; Fenofibrate; Infed; Infergen; Statins; and Ciprofloxacin  Home Medications   Current Outpatient Rx  Name  Route  Sig  Dispense  Refill  . ALPRAZolam (XANAX) 1 MG tablet   Oral   Take 0.5 tablets (0.5 mg total) by mouth at bedtime as needed for sleep or anxiety.   90 tablet   3   . atenolol (TENORMIN) 50 MG tablet   Oral   Take 1 tablet (50 mg total) by mouth daily before breakfast.   30 tablet   1   .  azelastine (ASTELIN) 137 MCG/SPRAY nasal spray   Each Nare   Place 2 sprays into both nostrils at bedtime. Use in each nostril as directed   30 mL   2   . bumetanide (BUMEX) 2 MG tablet   Oral   Take 2 mg by mouth daily as needed (for edema).          . cholecalciferol (VITAMIN D) 1000 UNITS tablet   Oral   Take 1,000 Units by mouth daily.         . cyclobenzaprine (FLEXERIL) 10 MG tablet   Oral   Take 10 mg by mouth 2 (two) times daily as needed for muscle spasms.          Marland Kitchen NITROSTAT 0.4 MG SL tablet   Sublingual   Place 0.4 mg under the tongue every 5 (five) minutes as needed for chest pain.          Marland Kitchen omeprazole (PRILOSEC) 40 MG capsule   Oral   Take 40 mg by mouth daily as needed (for reflux).          . triamcinolone cream (KENALOG) 0.1 %   Topical   Apply 1 application topically 2 (two) times daily as needed. Eczema          BP 134/57  Pulse 65  Temp(Src) 97.8 F (36.6 C) (Oral)  Resp 22  SpO2 95% Physical Exam  Constitutional: She is oriented to person, place, and time. She appears well-developed and well-nourished. No distress.  HENT:  Head: Normocephalic and atraumatic.  Mouth/Throat: Oropharynx is clear and moist.  Eyes: Conjunctivae are normal. Pupils are equal, round, and reactive to light.  Neck: Neck supple.  Cardiovascular: Normal rate and regular rhythm.  Exam reveals no gallop and no friction rub.   No murmur heard. Pulmonary/Chest: Effort normal and breath sounds normal. No respiratory distress.  Abdominal: Soft. Bowel sounds are normal. There is no tenderness.  Musculoskeletal: She exhibits edema and tenderness.  There is R calf TTP, no palpable cords  Neurological: She is alert and oriented to person, place, and time. No cranial nerve deficit.  Skin: Skin is warm and dry.  There is trace pitting edema to LLE and 1-2+pitting edema to RLE; RLE is warm and tender to palpation from foot to the knee; there is visible contusion on  anterior RLE; there is no oozing or drainage or skin breakdown; LLE w/ chronic venous changes and visible varicosities bilaterally    ED Course  Procedures (including critical care time) Labs Review Labs Reviewed  COMPREHENSIVE METABOLIC PANEL  CBC WITH DIFFERENTIAL   Imaging Review No results found.   EKG Interpretation  Date/Time:  Thursday November 25 2013 09:40:46 EST Ventricular Rate:  68 PR Interval:  186 QRS Duration: 115 QT Interval:  424 QTC Calculation: 451 R Axis:   -30 Text Interpretation:  Sinus arrhythmia Nonspecific intraventricular  conduction delay Low voltage, precordial leads Probable anteroseptal  infarct, old Confirmed by Christy Gentles  MD, [place] ([id number]) on 11/25/2013 9:54:46  AM      MDM   This is an 80yo female w/ hx DM (well controlled, last A1c 5.7% 10/2013), HTN, iron deficiency anemia (thought to be 2/2 AVM in GI tract), prior MI, NASH w/ cirrhosis and HCC, bilateral leg varicosities and edema who presents with R leg cellulitis vs DVT. She is afebrile with stable vital signs. Given her leg is warm and erythematous, I suspect she has some element of cellulitis. Patient has w/ hx of prior R leg injury so will evaluate for fracture. Will obtain CBC, CMP, RLE doppler, xray of R ankle and knee. Morphine for pain. I will start clindamycin 600mg  IV x 1 dose for tx of cellulitis. Suspect that patient will require admission given her multitude of medical problems w/ active infection. A nurse from Dr. Antonieta Pert office (Hematology Oncology) called me and asked to obtain anemia panel w/ ferritin so patient does not have to make a trip to their office later this week. We will order these labs.  1:29 PM CMP, CBC all wnl. Ankle and knee xrays showed no fracture. RLE doppler negative for DVT. Patient's pain more well controlled now. Hospitalists agree to admit patient for IV abx and pain control.  Rebecca Eaton, MD 11/25/13 1330

## 2013-11-25 NOTE — ED Notes (Signed)
Suanne Marker, daughter: [phone]  Nira Conn, granddaughter: [phone]

## 2013-11-25 NOTE — Telephone Encounter (Signed)
daugther called said pt keep falling needs to see Dr. Marin Olp. Pt is in ER Dr. Marin Olp and RN aware. JoEllen is going to call over there

## 2013-11-25 NOTE — ED Notes (Signed)
PT reports to EMS she has sewlling in both legs. Pain worse in left leg.

## 2013-11-26 ENCOUNTER — Inpatient Hospital Stay: Admission: RE | Admit: 2013-11-26 | Payer: Medicare Other | Source: Ambulatory Visit

## 2013-11-26 DIAGNOSIS — K7460 Unspecified cirrhosis of liver: Secondary | ICD-10-CM

## 2013-11-26 DIAGNOSIS — K253 Acute gastric ulcer without hemorrhage or perforation: Secondary | ICD-10-CM

## 2013-11-26 DIAGNOSIS — K5520 Angiodysplasia of colon without hemorrhage: Secondary | ICD-10-CM

## 2013-11-26 LAB — URINALYSIS, ROUTINE W REFLEX MICROSCOPIC
Bilirubin Urine: NEGATIVE
GLUCOSE, UA: NEGATIVE mg/dL
HGB URINE DIPSTICK: NEGATIVE
Ketones, ur: NEGATIVE mg/dL
Leukocytes, UA: NEGATIVE
Nitrite: NEGATIVE
Protein, ur: NEGATIVE mg/dL
SPECIFIC GRAVITY, URINE: 1.008 (ref 1.005–1.030)
Urobilinogen, UA: 1 mg/dL (ref 0.0–1.0)
pH: 7 (ref 5.0–8.0)

## 2013-11-26 LAB — COMPREHENSIVE METABOLIC PANEL
ALT: 22 U/L (ref 0–35)
AST: 35 U/L (ref 0–37)
Albumin: 2.5 g/dL — ABNORMAL LOW (ref 3.5–5.2)
Alkaline Phosphatase: 105 U/L (ref 39–117)
BUN: 10 mg/dL (ref 6–23)
CO2: 22 mEq/L (ref 19–32)
Calcium: 8.8 mg/dL (ref 8.4–10.5)
Chloride: 109 mEq/L (ref 96–112)
Creatinine, Ser: 0.96 mg/dL (ref 0.50–1.10)
GFR calc non Af Amer: 53 mL/min — ABNORMAL LOW (ref >90)
GFR, EST AFRICAN AMERICAN: 62 mL/min — AB (ref >90)
GLUCOSE: 87 mg/dL (ref 70–99)
Potassium: 4.2 mEq/L (ref 3.7–5.3)
Sodium: 141 mEq/L (ref 137–147)
TOTAL PROTEIN: 5.8 g/dL — AB (ref 6.0–8.3)
Total Bilirubin: 1.3 mg/dL — ABNORMAL HIGH (ref 0.3–1.2)

## 2013-11-26 LAB — CBC
HCT: 35.7 % — ABNORMAL LOW (ref 36.0–46.0)
HEMOGLOBIN: 12 g/dL (ref 12.0–15.0)
MCH: 32.3 pg (ref 26.0–34.0)
MCHC: 33.6 g/dL (ref 30.0–36.0)
MCV: 96.2 fL (ref 78.0–100.0)
Platelets: 125 10*3/uL — ABNORMAL LOW (ref 150–400)
RBC: 3.71 MIL/uL — ABNORMAL LOW (ref 3.87–5.11)
RDW: 13.8 % (ref 11.5–15.5)
WBC: 5.1 10*3/uL (ref 4.0–10.5)

## 2013-11-26 LAB — GLUCOSE, CAPILLARY
GLUCOSE-CAPILLARY: 106 mg/dL — AB (ref 70–99)
GLUCOSE-CAPILLARY: 83 mg/dL (ref 70–99)
Glucose-Capillary: 89 mg/dL (ref 70–99)

## 2013-11-26 MED ORDER — BUMETANIDE 2 MG PO TABS
2.0000 mg | ORAL_TABLET | Freq: Every day | ORAL | Status: DC
  Administered 2013-11-26 – 2013-11-29 (×4): 2 mg via ORAL
  Filled 2013-11-26 (×4): qty 1

## 2013-11-26 MED ORDER — SPIRONOLACTONE 50 MG PO TABS: 50.0000 mg | ORAL_TABLET | Freq: Every day | ORAL | Status: DC

## 2013-11-26 MED ORDER — VANCOMYCIN HCL 10 G IV SOLR
1500.0000 mg | INTRAVENOUS | Status: DC
  Administered 2013-11-26: 1500 mg via INTRAVENOUS
  Filled 2013-11-26: qty 1500

## 2013-11-26 MED ORDER — SPIRONOLACTONE 100 MG PO TABS
100.0000 mg | ORAL_TABLET | Freq: Every day | ORAL | Status: DC
  Administered 2013-11-26 – 2013-11-29 (×4): 100 mg via ORAL
  Filled 2013-11-26 (×4): qty 1

## 2013-11-26 MED ORDER — VANCOMYCIN HCL 10 G IV SOLR
1500.0000 mg | INTRAVENOUS | Status: DC
  Administered 2013-11-27 – 2013-11-28 (×2): 1500 mg via INTRAVENOUS
  Filled 2013-11-26 (×4): qty 1500

## 2013-11-26 MED ORDER — SPIRONOLACTONE 100 MG PO TABS: 100.0000 mg | ORAL_TABLET | Freq: Every day | ORAL | Status: DC

## 2013-11-26 MED ORDER — DIPHENHYDRAMINE HCL 25 MG PO CAPS
25.0000 mg | ORAL_CAPSULE | Freq: Once | ORAL | Status: AC
  Administered 2013-11-26: 25 mg via ORAL
  Filled 2013-11-26: qty 1

## 2013-11-26 NOTE — Evaluation (Signed)
Physical Therapy Evaluation Patient Details Name: [name] MRN: [id number] DOB: [birth year] Today's Date: 11/26/2013 Time: 1884-1660 PT Time Calculation (min): 35 min  PT Assessment / Plan / Recommendation History of Present Illness  Pt is an 80 y/o female admitted s/p fall at home over a week ago and now presenting with RLE cellulitis.   Clinical Impression  Pt admitted with RLE cellulitis. Pt currently with functional limitations due to the deficits listed below (see PT Problem List). At the time of PT eval, pt was able to ambulate a short distance but was limited by frequency of urination. Noted unsteadiness with transfers and ambulation. Pt will benefit from skilled PT to increase their independence and safety with mobility to allow discharge to the venue listed below.      PT Assessment  Patient needs continued PT services    Follow Up Recommendations  SNF    Does the patient have the potential to tolerate intense rehabilitation      Barriers to Discharge Decreased caregiver support Pt lives alone    Equipment Recommendations  None recommended by PT    Recommendations for Other Services     Frequency Min 2X/week    Precautions / Restrictions Precautions Precautions: Fall Restrictions Weight Bearing Restrictions: No   Pertinent Vitals/Pain Pain reported mostly with weight bearing and pressure on the lower right leg.       Mobility  Bed Mobility General bed mobility comments: Pt received sitting on BSC.  Transfers Overall transfer level: Needs assistance Equipment used: Rolling walker (2 wheeled) Transfers: Sit to/from [facility] Sit to Stand: Min guard Stand pivot transfers: Min assist General transfer comment: Pt able to perform transfers with min assist for occasional unsteadiness. VC's for hand placement on seated surface for safety.  Ambulation/Gait Ambulation/Gait assistance: Min guard Ambulation Distance (Feet): 50 Feet Assistive  device: Rolling walker (2 wheeled) Gait Pattern/deviations: Step-through pattern;Decreased stride length Gait velocity: Decreased Gait velocity interpretation: Below normal speed for age/gender General Gait Details: VC's for improved posture and safety awareness with the RW. Pt occasionally brings hands off the walker to point or make hand gestures while speaking.    Exercises     PT Diagnosis: Difficulty walking;Acute pain  PT Problem List: Decreased strength;Decreased range of motion;Decreased activity tolerance;Decreased balance;Decreased mobility;Decreased knowledge of use of DME;Decreased safety awareness;Pain PT Treatment Interventions: DME instruction;Gait training;Stair training;Functional mobility training;Therapeutic activities;Therapeutic exercise;Neuromuscular re-education;Patient/family education     PT Goals(Current goals can be found in the care plan section) Acute Rehab PT Goals Patient Stated Goal: To return to independent living PT Goal Formulation: With patient/family Time For Goal Achievement: 12/10/13 Potential to Achieve Goals: Good  Visit Information  Last PT Received On: 11/26/13 Assistance Needed: +1 History of Present Illness: Pt is an 80 y/o female admitted s/p fall at home over a week ago and now presenting with RLE cellulitis.        Prior [place] expects to be discharged to:: Private residence Living Arrangements: Alone Available Help at Discharge: Family;Available PRN/intermittently Type of Home: Apartment Web designer apartments) Home Access: Stairs to enter [employer] of Steps: 1 Entrance Stairs-Rails: None (Curb) Home Layout: One level Home Equipment: Environmental consultant - 2 wheels;Bedside commode;Grab bars - tub/shower;Shower seat Prior Function Level of Independence: Independent Comments: Still driving Communication Communication: No difficulties Dominant Hand: Right    Cognition  Cognition Arousal/Alertness:  Awake/alert Behavior During Therapy: WFL for tasks assessed/performed Overall Cognitive Status: Within Functional Limits for tasks assessed  Extremity/Trunk Assessment Upper Extremity Assessment Upper Extremity Assessment: Overall WFL for tasks assessed Lower Extremity Assessment Lower Extremity Assessment: RLE deficits/detail RLE Deficits / Details: Acute pain associated with cellulitis RLE: Unable to fully assess due to pain Cervical / Trunk Assessment Cervical / Trunk Assessment: Kyphotic   Balance Balance Overall balance assessment: Needs assistance Sitting-balance support: Feet supported Sitting balance-Leahy Scale: Fair Postural control: Left lateral lean Standing balance support: Bilateral upper extremity supported Standing balance-Leahy Scale: Fair  End of Session PT - End of Session Equipment Utilized During Treatment: Gait belt Activity Tolerance: Patient tolerated treatment well Patient left: in chair;with call bell/phone within reach;with family/visitor present Nurse Communication: Mobility status  GP     Jolyn Lent 11/26/2013, 5:09 PM  Jolyn Lent, PT, DPT Acute Rehabilitation Services Pager: [phone]

## 2013-11-26 NOTE — Progress Notes (Signed)
Called into room by patients daughter stating that she thought her mom was having an allergic reaction to the new antibiotic. Vancomycin was given for the first time at 1207.  Patient having no SOB but is itching around her ear and face with increased redness. No tongue swelling or lip swelling is present. VS 125/60 92% RA HR 61 and 98.0 Temp. Vanc stopped and flushed line with saline. MD notified and is aware at this time. He states that she is probably having red mans syndrome due to the rate of the medication being administered and to contact pharmacy to decrease the rate. Benedryl 25mg  PO given for comfort to help decrease patients itching. Pharmacy made aware and will reduce the rate of infusion.

## 2013-11-26 NOTE — Progress Notes (Signed)
Utilization review completed.  

## 2013-11-26 NOTE — ED Provider Notes (Signed)
I have personally seen and examined the patient.  I have discussed the plan of care with the resident.  I have reviewed the documentation on PMH/FH/Soc. History.  I have reviewed the documentation of the resident and agree.  I have reviewed and agree with the ECG interpretation(s) documented by the resident.   Sharyon Cable, MD 11/26/13 [phone]

## 2013-11-26 NOTE — Progress Notes (Signed)
Patient voided on 40cc in [facility].  Reports frequent urge to void, but only voiding small amounts.  Bladder scan performed revealing >700cc.  On-call notified and new orders received.  In and out cath performed at 0215 with 900cc clear, yellow urine obtained (no odor).  Specimen sent to lab.  Patient tolerated procedure well and reports relief.  Continue to monitor.

## 2013-11-26 NOTE — Progress Notes (Signed)
[facility] PROGRESS NOTE   [name] PYK:[id number] DOB: [birth year] DOA: 11/25/2013 PCP: Alesia Richards, MD  HPI/Subjective: Feels better, denies any fever or chills. Right leg better with some bruises and redness.  Assessment/Plan: Active Problems:   Anemia, iron deficiency   Hypertension   Diabetes mellitus   Cirrhosis   Cellulitis of right lower extremity   Fall   Cellulitis of the right lower extremity -Recent fall with bruises to the right lower extremity. -Patient developed warmth, redness and swelling consistent with cellulitis. -Patient started on IV clindamycin, because of history of cirrhosis and increased risk of serious infection switched to vancomycin. -Keep leg elevated, diurese as tolerated.  Fall -Patient mentioned she does not fall, this is recent decline in her functional status. -Patient reported that happening because of her lower extremity swelling. -PT/OT to evaluate and treat.  Diabetes mellitus -Carbohydrate modified diet and SSI.  Cirrhosis -Secondary to chronic active hepatitis, has history of [place] status post ablation. -Diurese as tolerated.  Code Status: Full code Family Communication: Plan discussed with the patient. Disposition Plan: Remains inpatient   Consultants:  None  Procedures:  None  Antibiotics:  Clindamycin switch to Vanco   Objective: Filed Vitals:   11/26/13 0806  BP:   Pulse: 68  Temp:   Resp:     Intake/Output Summary (Last 24 hours) at 11/26/13 1118 Last data filed at 11/26/13 0643  Gross per 24 hour  Intake    510 ml  Output    940 ml  Net   -430 ml   There were no vitals filed for this visit.  Exam: General: Alert and awake, oriented x3, not in any acute distress. HEENT: anicteric sclera, pupils reactive to light and accommodation, EOMI CVS: S1-S2 clear, no murmur rubs or gallops Chest: clear to auscultation bilaterally, no wheezing, rales or rhonchi Abdomen: soft nontender,  nondistended, normal bowel sounds, no organomegaly Extremities: no cyanosis, clubbing or edema noted bilaterally Neuro: Cranial nerves II-XII intact, no focal neurological deficits  Data Reviewed: Basic Metabolic Panel:  Recent Labs Lab 11/25/13 0959 11/26/13 0517  NA 142 141  K 4.0 4.2  CL 107 109  CO2 23 22  GLUCOSE 115* 87  BUN 10 10  CREATININE 0.81 0.96  CALCIUM 9.5 8.8   Liver Function Tests:  Recent Labs Lab 11/25/13 0959 11/26/13 0517  AST 45* 35  ALT 27 22  ALKPHOS 128* 105  BILITOT 1.4* 1.3*  PROT 6.8 5.8*  ALBUMIN 2.9* 2.5*   No results found for this basename: LIPASE, AMYLASE,  in the last 168 hours No results found for this basename: AMMONIA,  in the last 168 hours CBC:  Recent Labs Lab 11/25/13 0959 11/26/13 0517  WBC 5.3 5.1  NEUTROABS 2.8  --   HGB 13.6 12.0  HCT 39.8 35.7*  MCV 96.4 96.2  PLT 140* 125*   Cardiac Enzymes: No results found for this basename: CKTOTAL, CKMB, CKMBINDEX, TROPONINI,  in the last 168 hours BNP (last 3 results) No results found for this basename: PROBNP,  in the last 8760 hours CBG:  Recent Labs Lab 11/25/13 1728 11/25/13 2111 11/26/13 0629  GLUCAP 89 106* 83    Micro No results found for this or any previous visit (from the past 240 hour(s)).   Studies: Dg Knee 2 Views Right  11/25/2013   CLINICAL DATA:  Right leg pain  EXAM: RIGHT KNEE - 1-2 VIEW  COMPARISON:  None.  FINDINGS: There is no evidence of fracture, dislocation,  or joint effusion. There is no evidence of arthropathy or other focal bone abnormality. Soft tissues are unremarkable.  IMPRESSION: Negative.   Electronically Signed   By: Franchot Gallo M.D.   On: 11/25/2013 10:49   Dg Ankle 2 Views Right  11/25/2013   CLINICAL DATA:  Fall.  Pain  EXAM: RIGHT ANKLE - 2 VIEW  COMPARISON:  None.  FINDINGS: Negative for acute fracture.  Normal alignment.  Diffuse soft tissue swelling.  Calcaneal spurring. Irregularity at the base of the fifth metatarsal  suggestive of old injury.  IMPRESSION: Negative for acute fracture.   Electronically Signed   By: Franchot Gallo M.D.   On: 11/25/2013 10:49    Scheduled Meds: . atenolol  50 mg Oral QAC breakfast  . azelastine  2 spray Each Nare QHS  . cholecalciferol  1,000 Units Oral Daily  . clindamycin (CLEOCIN) IV  600 mg Intravenous 3 times per day  . enoxaparin (LOVENOX) injection  40 mg Subcutaneous Q24H  . pantoprazole  40 mg Oral Daily  . sodium chloride  3 mL Intravenous Q12H   Continuous Infusions:      Time spent: 35 minutes    [facility] A  [facility] Pager [phone] If 7PM-7AM, please contact night-coverage at [email or web address], password [facility] 11/26/2013, 11:18 AM  LOS: 1 day

## 2013-11-26 NOTE — Progress Notes (Signed)
ANTIBIOTIC CONSULT NOTE - INITIAL  Pharmacy Consult for vancomycin Indication: cellulitis  Allergies  Allergen Reactions  . Avelox [Moxifloxacin Hcl In Nacl] Anaphylaxis  . Sulfa Antibiotics Hives  . Ace Inhibitors   . Doxycycline   . Fenofibrate     constipation  . Infed [Iron Dextran]     IV IRON  . Infergen [Interferon [place]-1]   . Statins     Liver function elevations  . Ciprofloxacin Other (See Comments)    Complains of sore mouth    Patient Measurements: Weight: 85.3 kg  Vital Signs: Temp: 98.6 F (37 C) (03/06 0613) BP: 106/45 mmHg (03/06 0613) Pulse Rate: 68 (03/06 0806) Intake/Output from previous day: 03/05 0701 - 03/06 0700 In: 510 [P.O.:410; IV Piggyback:100] Out: 940 [Urine:940] Recent Labs  11/25/13 0959 11/26/13 0517  WBC 5.3 5.1  HGB 13.6 12.0  PLT 140* 125*  CREATININE 0.81 0.96   The CrCl is unknown because both a height and weight (above a minimum accepted value) are required for this calculation. No results found for this basename: VANCOTROUGH, VANCOPEAK, VANCORANDOM, GENTTROUGH, GENTPEAK, GENTRANDOM, TOBRATROUGH, TOBRAPEAK, TOBRARND, AMIKACINPEAK, AMIKACINTROU, AMIKACIN,  in the last 72 hours   Microbiology: No results found for this or any previous visit (from the past 720 hour(s)).  Medical History: Past Medical History  Diagnosis Date  . Anemia, iron deficiency 08/07/2011  . Angiodysplasia of intestinal tract 08/07/2011  . Hypertension   . Arthritis   . Diverticulitis   . Phlebitis   . Macular degeneration   . TIA (transient ischemic attack)   . MI (myocardial infarction)   . Hiatal hernia   . AVM (arteriovenous malformation) of colon with hemorrhage   . Varicose veins of legs   . Cataract   . GERD (gastroesophageal reflux disease)   . DJD (degenerative joint disease)   . Diabetes mellitus     borderline  . History of blood transfusion   . PONV (postoperative nausea and vomiting)   . hepatocellar ca dx'd 08/2012  .  Edema leg   . Fatty liver   . Vitamin D deficiency     Assessment: 80 year old female with cellulitis of her right lower extremity.  Pt was started on clindamycin and now is being transitioned to vancomycin.  Patient's renal function is stable, Scr 0.96, wbc is wnl, afebrile, soft bp.  Goal of Therapy:  Vancomycin trough level 10-15 mcg/ml  Plan:  Vancomycin 1500 mg IV q24h VT at steady state prn  Hughes Better, PharmD, BCPS Clinical Pharmacist Pager: [phone] 11/26/2013 11:41 AM

## 2013-11-27 LAB — CBC
HEMATOCRIT: 37.4 % (ref 36.0–46.0)
Hemoglobin: 13 g/dL (ref 12.0–15.0)
MCH: 33.5 pg (ref 26.0–34.0)
MCHC: 34.8 g/dL (ref 30.0–36.0)
MCV: 96.4 fL (ref 78.0–100.0)
Platelets: 125 10*3/uL — ABNORMAL LOW (ref 150–400)
RBC: 3.88 MIL/uL (ref 3.87–5.11)
RDW: 13.5 % (ref 11.5–15.5)
WBC: 5.5 10*3/uL (ref 4.0–10.5)

## 2013-11-27 LAB — BASIC METABOLIC PANEL
BUN: 12 mg/dL (ref 6–23)
CHLORIDE: 107 meq/L (ref 96–112)
CO2: 24 mEq/L (ref 19–32)
Calcium: 8.8 mg/dL (ref 8.4–10.5)
Creatinine, Ser: 0.98 mg/dL (ref 0.50–1.10)
GFR calc Af Amer: 60 mL/min — ABNORMAL LOW (ref >90)
GFR calc non Af Amer: 52 mL/min — ABNORMAL LOW (ref >90)
Glucose, Bld: 84 mg/dL (ref 70–99)
Potassium: 3.9 mEq/L (ref 3.7–5.3)
Sodium: 143 mEq/L (ref 137–147)

## 2013-11-27 LAB — PROTIME-INR
INR: 1.26 (ref 0.00–1.49)
Prothrombin Time: 15.5 seconds — ABNORMAL HIGH (ref 11.6–15.2)

## 2013-11-27 NOTE — Progress Notes (Signed)
[facility] PROGRESS NOTE   [name]:[id number] DOB: 05-Oct-1927 DOA: 11/25/2013 PCP: Alesia Richards, MD  HPI/Subjective: Right leg redness and swelling is better. Seen by PT/OT recommended to SNF.  Assessment/Plan: Active Problems:   Anemia, iron deficiency   Hypertension   Diabetes mellitus   Cirrhosis   Cellulitis of right lower extremity   Fall   Cellulitis of the right lower extremity -Recent fall with bruises to the right lower extremity. -Patient developed warmth, redness and swelling consistent with cellulitis. -Patient started on IV clindamycin, because of history of cirrhosis and increased risk of C. diff infection switched to vancomycin. -Keep leg elevated, diurese as tolerated.  Fall -Patient mentioned she does not fall, this is recent decline in her functional status. -Patient reported that happening because of her lower extremity swelling. -PT/OT to evaluate and treat.  Diabetes mellitus -Carbohydrate modified diet and SSI.  Cirrhosis -Secondary to chronic active hepatitis, has history of [place] status post ablation. -Diurese as tolerated.  Code Status: Full code Family Communication: Plan discussed with the patient. Disposition Plan: Remains inpatient   Consultants:  None  Procedures:  None  Antibiotics:  Clindamycin switched to Vanco   Objective: Filed Vitals:   11/27/13 0525  BP: 119/42  Pulse: 66  Temp: 98.6 F (37 C)  Resp: 24    Intake/Output Summary (Last 24 hours) at 11/27/13 1013 Last data filed at 11/27/13 0730  Gross per 24 hour  Intake 1199.67 ml  Output   1150 ml  Net  49.67 ml   Filed Weights   11/27/13 0300  Weight: 85.276 kg (188 lb)    Exam: General: Alert and awake, oriented x3, not in any acute distress. HEENT: anicteric sclera, pupils reactive to light and accommodation, EOMI CVS: S1-S2 clear, no murmur rubs or gallops Chest: clear to auscultation bilaterally, no wheezing, rales or  rhonchi Abdomen: soft nontender, nondistended, normal bowel sounds, no organomegaly Extremities: no cyanosis, clubbing or edema noted bilaterally Neuro: Cranial nerves II-XII intact, no focal neurological deficits  Data Reviewed: Basic Metabolic Panel:  Recent Labs Lab 11/25/13 0959 11/26/13 0517 11/27/13 0450  NA 142 141 143  K 4.0 4.2 3.9  CL 107 109 107  CO2 23 22 24   GLUCOSE 115* 87 84  BUN 10 10 12   CREATININE 0.81 0.96 0.98  CALCIUM 9.5 8.8 8.8   Liver Function Tests:  Recent Labs Lab 11/25/13 0959 11/26/13 0517  AST 45* 35  ALT 27 22  ALKPHOS 128* 105  BILITOT 1.4* 1.3*  PROT 6.8 5.8*  ALBUMIN 2.9* 2.5*   No results found for this basename: LIPASE, AMYLASE,  in the last 168 hours No results found for this basename: AMMONIA,  in the last 168 hours CBC:  Recent Labs Lab 11/25/13 0959 11/26/13 0517 11/27/13 0450  WBC 5.3 5.1 5.5  NEUTROABS 2.8  --   --   HGB 13.6 12.0 13.0  HCT 39.8 35.7* 37.4  MCV 96.4 96.2 96.4  PLT 140* 125* 125*   Cardiac Enzymes: No results found for this basename: CKTOTAL, CKMB, CKMBINDEX, TROPONINI,  in the last 168 hours BNP (last 3 results) No results found for this basename: PROBNP,  in the last 8760 hours CBG:  Recent Labs Lab 11/25/13 1728 11/25/13 2111 11/26/13 0629  GLUCAP 89 106* 83    Micro No results found for this or any previous visit (from the past 240 hour(s)).   Studies: Dg Knee 2 Views Right  11/25/2013   CLINICAL DATA:  Right leg pain  EXAM: RIGHT KNEE - 1-2 VIEW  COMPARISON:  None.  FINDINGS: There is no evidence of fracture, dislocation, or joint effusion. There is no evidence of arthropathy or other focal bone abnormality. Soft tissues are unremarkable.  IMPRESSION: Negative.   Electronically Signed   By: Franchot Gallo M.D.   On: 11/25/2013 10:49   Dg Ankle 2 Views Right  11/25/2013   CLINICAL DATA:  Fall.  Pain  EXAM: RIGHT ANKLE - 2 VIEW  COMPARISON:  None.  FINDINGS: Negative for acute fracture.   Normal alignment.  Diffuse soft tissue swelling.  Calcaneal spurring. Irregularity at the base of the fifth metatarsal suggestive of old injury.  IMPRESSION: Negative for acute fracture.   Electronically Signed   By: Franchot Gallo M.D.   On: 11/25/2013 10:49    Scheduled Meds: . atenolol  50 mg Oral QAC breakfast  . azelastine  2 spray Each Nare QHS  . bumetanide  2 mg Oral Daily  . cholecalciferol  1,000 Units Oral Daily  . enoxaparin (LOVENOX) injection  40 mg Subcutaneous Q24H  . pantoprazole  40 mg Oral Daily  . sodium chloride  3 mL Intravenous Q12H  . spironolactone  100 mg Oral Daily  . vancomycin  1,500 mg Intravenous Q24H   Continuous Infusions:      Time spent: 35 minutes    [facility] A  [facility] Pager [phone] If 7PM-7AM, please contact night-coverage at [email or web address], password [facility] 11/27/2013, 10:13 AM  LOS: 2 days

## 2013-11-28 LAB — BASIC METABOLIC PANEL
BUN: 12 mg/dL (ref 6–23)
CALCIUM: 8.7 mg/dL (ref 8.4–10.5)
CO2: 26 meq/L (ref 19–32)
CREATININE: 0.92 mg/dL (ref 0.50–1.10)
Chloride: 103 mEq/L (ref 96–112)
GFR calc Af Amer: 65 mL/min — ABNORMAL LOW (ref >90)
GFR calc non Af Amer: 56 mL/min — ABNORMAL LOW (ref >90)
GLUCOSE: 80 mg/dL (ref 70–99)
Potassium: 3.3 mEq/L — ABNORMAL LOW (ref 3.7–5.3)
Sodium: 141 mEq/L (ref 137–147)

## 2013-11-28 MED ORDER — MAGNESIUM SULFATE 40 MG/ML IJ SOLN
2.0000 g | Freq: Once | INTRAMUSCULAR | Status: AC
  Administered 2013-11-28: 2 g via INTRAVENOUS
  Filled 2013-11-28: qty 50

## 2013-11-28 MED ORDER — POTASSIUM CHLORIDE CRYS ER 20 MEQ PO TBCR
60.0000 meq | EXTENDED_RELEASE_TABLET | Freq: Four times a day (QID) | ORAL | Status: AC
  Administered 2013-11-28: 60 meq via ORAL
  Filled 2013-11-28 (×2): qty 3

## 2013-11-28 NOTE — Progress Notes (Signed)
KCL 60 po given, awaiting mag 2gm iv. Patient educated on benefits of appropriate nutrition, ensure provided.Will request nutrition consult.

## 2013-11-28 NOTE — Progress Notes (Signed)
[facility] PROGRESS NOTE   [name] TKZ:[id number] DOB: 23-Feb-1928 DOA: 11/25/2013 PCP: Unk Pinto DAVID, MD  HPI/Subjective: Seeing sitting at bedside, redness and swelling much better than yesterday. Seen by PT/OT recommended to SNF.  Assessment/Plan: Active Problems:   Anemia, iron deficiency   Hypertension   Diabetes mellitus   Cirrhosis   Cellulitis of right lower extremity   Fall   Cellulitis of the right lower extremity -Recent fall with bruises to the right lower extremity. -Patient developed warmth, redness and swelling consistent with cellulitis. -Patient started on IV clindamycin, because of history of cirrhosis and increased risk of C. diff infection switched to vancomycin. -Keep leg elevated, diurese as tolerated.  Fall -Patient mentioned she does not fall, this is recent decline in her functional status. -Patient reported that happening because of her lower extremity swelling. -PT/OT to evaluate and treat.  Diabetes mellitus -Carbohydrate modified diet and SSI.  Cirrhosis -Secondary to chronic active hepatitis, has history of [place] status post ablation. -Diurese as tolerated.  Code Status: Full code Family Communication: Plan discussed with the patient. Disposition Plan: Remains inpatient   Consultants:  None  Procedures:  None  Antibiotics:  Clindamycin switched to Vanco   Objective: Filed Vitals:   11/28/13 0601  BP: 117/55  Pulse: 65  Temp: 97.9 F (36.6 C)  Resp: 18    Intake/Output Summary (Last 24 hours) at 11/28/13 1127 Last data filed at 11/28/13 0800  Gross per 24 hour  Intake    963 ml  Output      0 ml  Net    963 ml   Filed Weights   11/27/13 0300 11/28/13 0601  Weight: 85.276 kg (188 lb) 79.379 kg (175 lb)    Exam: General: Alert and awake, oriented x3, not in any acute distress. HEENT: anicteric sclera, pupils reactive to light and accommodation, EOMI CVS: S1-S2 clear, no murmur rubs or  gallops Chest: clear to auscultation bilaterally, no wheezing, rales or rhonchi Abdomen: soft nontender, nondistended, normal bowel sounds, no organomegaly Extremities: no cyanosis, clubbing or edema noted bilaterally Neuro: Cranial nerves II-XII intact, no focal neurological deficits  Data Reviewed: Basic Metabolic Panel:  Recent Labs Lab 11/25/13 0959 11/26/13 0517 11/27/13 0450 11/28/13 0545  NA 142 141 143 141  K 4.0 4.2 3.9 3.3*  CL 107 109 107 103  CO2 23 22 24 26   GLUCOSE 115* 87 84 80  BUN 10 10 12 12   CREATININE 0.81 0.96 0.98 0.92  CALCIUM 9.5 8.8 8.8 8.7   Liver Function Tests:  Recent Labs Lab 11/25/13 0959 11/26/13 0517  AST 45* 35  ALT 27 22  ALKPHOS 128* 105  BILITOT 1.4* 1.3*  PROT 6.8 5.8*  ALBUMIN 2.9* 2.5*   No results found for this basename: LIPASE, AMYLASE,  in the last 168 hours No results found for this basename: AMMONIA,  in the last 168 hours CBC:  Recent Labs Lab 11/25/13 0959 11/26/13 0517 11/27/13 0450  WBC 5.3 5.1 5.5  NEUTROABS 2.8  --   --   HGB 13.6 12.0 13.0  HCT 39.8 35.7* 37.4  MCV 96.4 96.2 96.4  PLT 140* 125* 125*   Cardiac Enzymes: No results found for this basename: CKTOTAL, CKMB, CKMBINDEX, TROPONINI,  in the last 168 hours BNP (last 3 results) No results found for this basename: PROBNP,  in the last 8760 hours CBG:  Recent Labs Lab 11/25/13 1728 11/25/13 2111 11/26/13 0629  GLUCAP 89 106* 83    Micro No  results found for this or any previous visit (from the past 240 hour(s)).   Studies: No results found.  Scheduled Meds: . atenolol  50 mg Oral QAC breakfast  . azelastine  2 spray Each Nare QHS  . bumetanide  2 mg Oral Daily  . cholecalciferol  1,000 Units Oral Daily  . enoxaparin (LOVENOX) injection  40 mg Subcutaneous Q24H  . pantoprazole  40 mg Oral Daily  . potassium chloride  60 mEq Oral Q6H  . sodium chloride  3 mL Intravenous Q12H  . spironolactone  100 mg Oral Daily  . vancomycin  1,500  mg Intravenous Q24H   Continuous Infusions:      Time spent: 35 minutes    [facility] A  [facility] Pager [phone] If 7PM-7AM, please contact night-coverage at [email or web address], password [facility] 11/28/2013, 11:27 AM  LOS: 3 days

## 2013-11-28 NOTE — Plan of Care (Signed)
Problem: Phase III Progression Outcomes Goal: IV Meds changed to PO Outcome: Progressing Patient continuing to receive IV Vancomycin at present

## 2013-11-28 NOTE — Progress Notes (Signed)
Clinical Social Work Department BRIEF PSYCHOSOCIAL ASSESSMENT 11/28/2013  Patient:  [name], [name]     Account Number:  [id number]     Admit date:  11/25/2013  Clinical Social Worker:  Rolinda Roan  Date/Time:  11/28/2013 06:47 PM  Referred by:  Physician  Date Referred:  11/28/2013 Referred for  SNF Placement   Other Referral:   Interview type:  Patient Other interview type:    PSYCHOSOCIAL DATA Living Status:  ALONE Admitted from facility:   Level of care:   Primary support name:  Laurita Quint [phone] Primary support relationship to patient:  CHILD, ADULT Degree of support available:   Very supportive.    CURRENT CONCERNS  Other Concerns:    SOCIAL WORK ASSESSMENT / PLAN Clinical Social Worker (CSW) met with patient to discuss D/C plan. Patient reported that she lives alone in [place] and her daughter Suanne Marker is her primary support person. Patient is agreeable to SNF search in [facility]. Patient's first choice is [facility] and second choice is [facility].   Assessment/plan status:  Psychosocial Support/Ongoing Assessment of Needs Other assessment/ plan:   Information/referral to community resources:   CSW gave patient SNF list and HPOA paper work per patient's request.    PATIENT'S/FAMILY'S RESPONSE TO PLAN OF CARE: Patient thanked CSW for visit and starting placement process.

## 2013-11-28 NOTE — Progress Notes (Addendum)
Clinical Social Work Department CLINICAL SOCIAL WORK PLACEMENT NOTE 11/28/2013  Patient:  JLEE, HARKLESS  Account Number:  [id number] [place] date:  11/25/2013  Clinical Social Worker:  Blima Rich, Latanya Presser  Date/time:  11/28/2013 06:51 PM  Clinical Social Work is seeking post-discharge placement for this patient at the following level of care:   SKILLED NURSING   (*CSW will update this form in Epic as items are completed)   11/28/2013  Patient/family provided with [place] Department of Clinical Social Work's list of facilities offering this level of care within the geographic area requested by the patient (or if unable, by the patient's family).  11/28/2013  Patient/family informed of their freedom to choose among providers that offer the needed level of care, that participate in Medicare, Medicaid or managed care program needed by the patient, have an available bed and are willing to accept the patient.  11/28/2013  Patient/family informed of [facility]' ownership interest in [facility], as well as of the fact that they are under no obligation to receive care at this facility.  PASARR submitted to EDS on 11/28/2013 PASARR number received from EDS on 11/28/2013  FL2 transmitted to all facilities in geographic area requested by pt/family on  11/28/2013 FL2 transmitted to all facilities within larger geographic area on   Patient informed that his/her managed care company has contracts with or will negotiate with  certain facilities, including the following:     Patient/family informed of bed offers received:  11/29/2013 Patient chooses bed at [facility] Physician recommends and patient chooses bed at    Patient to be transferred to [facility]  on  11/29/2013 Patient to be transferred to facility by [facility]  The following physician request were entered in Epic:   Additional Comments:

## 2013-11-29 DIAGNOSIS — I10 Essential (primary) hypertension: Secondary | ICD-10-CM

## 2013-11-29 DIAGNOSIS — K769 Liver disease, unspecified: Secondary | ICD-10-CM

## 2013-11-29 LAB — MAGNESIUM: Magnesium: 2.2 mg/dL (ref 1.5–2.5)

## 2013-11-29 LAB — BASIC METABOLIC PANEL
BUN: 14 mg/dL (ref 6–23)
CALCIUM: 9 mg/dL (ref 8.4–10.5)
CO2: 24 mEq/L (ref 19–32)
Chloride: 104 mEq/L (ref 96–112)
Creatinine, Ser: 0.91 mg/dL (ref 0.50–1.10)
GFR calc Af Amer: 66 mL/min — ABNORMAL LOW (ref >90)
GFR, EST NON AFRICAN AMERICAN: 57 mL/min — AB (ref >90)
Glucose, Bld: 96 mg/dL (ref 70–99)
Potassium: 3.9 mEq/L (ref 3.7–5.3)
Sodium: 142 mEq/L (ref 137–147)

## 2013-11-29 MED ORDER — BUMETANIDE 0.5 MG PO TABS: 2.0000 mg | ORAL_TABLET | Freq: Every day | ORAL | Status: DC

## 2013-11-29 MED ORDER — BOOST PLUS PO LIQD
237.0000 mL | Freq: Three times a day (TID) | ORAL | Status: DC
  Filled 2013-11-29 (×4): qty 237

## 2013-11-29 MED ORDER — CEPHALEXIN 500 MG PO CAPS: 500.0000 mg | ORAL_CAPSULE | Freq: Three times a day (TID) | ORAL | Status: DC

## 2013-11-29 MED ORDER — SPIRONOLACTONE 50 MG PO TABS: 50.0000 mg | ORAL_TABLET | Freq: Every day | ORAL | Status: DC

## 2013-11-29 NOTE — Progress Notes (Signed)
CSW Armed forces technical officer) spoke with pt and informed of bed offers. Pt would like to accept bed offer at [facility]. CSW called and left voicemail to inform facility of acceptance and possible dc today.  [place], [place]

## 2013-11-29 NOTE — Progress Notes (Signed)
INITIAL NUTRITION ASSESSMENT  DOCUMENTATION CODES Per approved criteria  -Non-severe (moderate) malnutrition in the context of chronic illness   INTERVENTION: Recommend liberalizing diet to Regular if pt does not discharge. (Pt is not a vegetarian) Boost Plus TID Encouraged protein intake at meals, sources reviewed  NUTRITION DIAGNOSIS: Increased nutrient needs related to cellulitis as evidenced by estimated needs.   Goal: Pt to meet >/= 90% of their estimated nutrition needs   Monitor:  PO intake, supplement acceptance, weight trend  Reason for Assessment: MD consult  80 y.o. female  Admitting Dx: <principal problem not specified>  ASSESSMENT: Pt admitted after a fall with cellulitis. Per pt she is being discharged to rehab today.  Pt reports losing weight over the last couple of months which has been coupled with decreased mobility due to cellulitis as well as an increase in weakness. Pt lives alone and does not cook for herself. Pt does sandwiches and take out. Pt reports eating less at meals with decreased appetite. Breakfast is a bowel of cereal, Lunch is a sandwich, Dinner is usually brought by family (pizza, [employer]) which pt states that she usually eats about 1/2 of.  Pt has had about 5% weight loss in the last 2 months. Pt is sitting in chair so unable to get an update on weight. Pt has lunch in front of her (potatoes, carrots, and green beans) pt had only had a couple of bites. Pt on vegetarian diet but is not a vegetarian, she eats very little meat.   Potassium and Magnesium now WNL after supplementation.   Nutrition Focused Physical Exam:  Subcutaneous Fat:  Orbital Region: WNL Upper Arm Region: WNL Thoracic and Lumbar Region: WNL  Muscle:  Temple Region: mild wasting Clavicle Bone Region: mild wasting Clavicle and Acromion Bone Region: WNL Scapular Bone Region: WNL Dorsal Hand: severe wasting Patellar Region: WNL Anterior Thigh Region: WNL Posterior Calf  Region: WNL  Edema: present in extremity with cellulitis   Height: Ht Readings from Last 1 Encounters:  11/27/13 5\' 6"  (1.676 m)    Weight: Wt Readings from Last 1 Encounters:  11/29/13 173 lb 6.4 oz (78.654 kg)    Ideal Body Weight: 59 kg   % Ideal Body Weight: 133%  Wt Readings from Last 10 Encounters:  11/29/13 173 lb 6.4 oz (78.654 kg)  11/04/13 188 lb (85.276 kg)  10/26/13 190 lb (86.183 kg)  09/10/13 186 lb 9.6 oz (84.641 kg)  09/05/13 182 lb 12.2 oz (82.9 kg)  08/17/13 182 lb (82.555 kg)  08/09/13 182 lb (82.555 kg)  08/03/13 182 lb (82.555 kg)  07/26/13 185 lb (83.915 kg)  07/19/13 185 lb (83.915 kg)    Usual Body Weight: 183 lb   % Usual Body Weight: 95%  BMI:  Body mass index is 28 kg/(m^2).  Estimated Nutritional Needs: Kcal: 1600-1800 Protein: 80-90 grams Fluid: > 1.6 L/day  Skin:  Right leg cellulitis   Diet Order: Vegetarian Meal Completion: 25%  EDUCATION NEEDS: -Education needs addressed   Intake/Output Summary (Last 24 hours) at 11/29/13 1110 Last data filed at 11/28/13 2100  Gross per 24 hour  Intake    240 ml  Output      0 ml  Net    240 ml    Last BM: 3/9   Labs:   Recent Labs Lab 11/27/13 0450 11/28/13 0545 11/29/13 0550  NA 143 141 142  K 3.9 3.3* 3.9  CL 107 103 104  CO2 24 26 24   BUN 12  12 14  CREATININE 0.98 0.92 0.91  CALCIUM 8.8 8.7 9.0  MG  --   --  2.2  GLUCOSE 84 80 96    CBG (last 3)  No results found for this basename: GLUCAP,  in the last 72 hours  Scheduled Meds: . atenolol  50 mg Oral QAC breakfast  . azelastine  2 spray Each Nare QHS  . bumetanide  2 mg Oral Daily  . cholecalciferol  1,000 Units Oral Daily  . enoxaparin (LOVENOX) injection  40 mg Subcutaneous Q24H  . pantoprazole  40 mg Oral Daily  . sodium chloride  3 mL Intravenous Q12H  . spironolactone  100 mg Oral Daily  . vancomycin  1,500 mg Intravenous Q24H    Continuous Infusions:   Past Medical History  Diagnosis Date  .  Anemia, iron deficiency 08/07/2011  . Angiodysplasia of intestinal tract 08/07/2011  . Hypertension   . Arthritis   . Diverticulitis   . Phlebitis   . Macular degeneration   . TIA (transient ischemic attack)   . MI (myocardial infarction)   . Hiatal hernia   . AVM (arteriovenous malformation) of colon with hemorrhage   . Varicose veins of legs   . Cataract   . GERD (gastroesophageal reflux disease)   . DJD (degenerative joint disease)   . Diabetes mellitus     borderline  . History of blood transfusion   . PONV (postoperative nausea and vomiting)   . hepatocellar ca dx'd 08/2012  . Edema leg   . Fatty liver   . Vitamin D deficiency     Past Surgical History  Procedure Laterality Date  . 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 Abdominal hysterectomy    . Hemorrhoid surgery    . Esophagogastroduodenoscopy  08/30/2012    Procedure: ESOPHAGOGASTRODUODENOSCOPY (EGD);  Surgeon: Jeryl Columbia, MD;  Location: [facility];  Service: Endoscopy;  Laterality: N/A;  . Radiofrequency ablation liver tumor Right 01/2013    HFA ablation right liver lesion  . Colonoscopy  06/2009    Dr. Watt Climes, due 5 years  . Endoscopic vein laser treatment Left 2014    Dr. Tollie Pizza [place], [place], [place] Pager [phone] After Hours Pager

## 2013-11-29 NOTE — Progress Notes (Signed)
Physical Therapy Treatment Patient Details Name: JUANITTA EARNHARDT MRN: [id number] DOB: 24-Jul-1928 Today's Date: 11/29/2013 Time: [phone] PT Time Calculation (min): 29 min  PT Assessment / Plan / Recommendation  History of Present Illness Pt is an 80 y/o female admitted s/p fall at home over a week ago and now presenting with RLE cellulitis.    PT Comments   Good progress with mobility; looking forward to rehab  Follow Up Recommendations  SNF     Does the patient have the potential to tolerate intense rehabilitation     Barriers to Discharge        Equipment Recommendations  None recommended by PT    Recommendations for Other Services    Frequency Min 2X/week   Progress towards PT Goals Progress towards PT goals: Progressing toward goals  Plan Current plan remains appropriate    Precautions / Restrictions Precautions Precautions: Fall Restrictions Weight Bearing Restrictions: No   Pertinent Vitals/Pain R foot and knee after walking in pain; did not rate elevated for edema and pain control     Mobility  Bed Mobility General bed mobility comments: Pt received sitting on BSC.  Transfers Overall transfer level: Needs assistance Equipment used: Rolling walker (2 wheeled) Transfers: Sit to/from Stand Sit to Stand: Min guard General transfer comment: Pt able to perform transfers with min assist for occasional unsteadiness. VC's for hand placement on seated surface for safety.  Ambulation/Gait Ambulation/Gait assistance: Min guard Ambulation Distance (Feet): 80 Feet Assistive device: Rolling walker (2 wheeled) Gait Pattern/deviations: Decreased stride length Gait velocity: Decreased General Gait Details: VC's for improved posture and safety awareness with the RW    Exercises     PT Diagnosis:    PT Problem List:   PT Treatment Interventions:     PT Goals (current goals can now be found in the care plan section) Acute Rehab PT Goals Patient Stated Goal: To return to  independent living PT Goal Formulation: With patient/family Time For Goal Achievement: 12/10/13 Potential to Achieve Goals: Good  Visit Information  Last PT Received On: 11/29/13 Assistance Needed: +1 History of Present Illness: Pt is an 80 y/o female admitted s/p fall at home over a week ago and now presenting with RLE cellulitis.     Subjective Data  Subjective: Lots of questions re: rehab, how to prevent cellulitis, how to don compression stockings Patient Stated Goal: To return to independent living   Cognition  Cognition Arousal/Alertness: Awake/alert Behavior During Therapy: WFL for tasks assessed/performed Overall Cognitive Status: Within Functional Limits for tasks assessed    Balance     End of Session PT - End of Session Activity Tolerance: Patient tolerated treatment well Patient left: in chair;with call bell/phone within reach;with family/visitor present Nurse Communication: Mobility status   GP     [place] 11/29/2013, 5:05 PM  Roney Marion, [place] Pager [phone] Office [phone]

## 2013-11-29 NOTE — Progress Notes (Signed)
ANTIBIOTIC CONSULT NOTE - Follow up  Pharmacy Consult for vancomycin Indication: cellulitis  Allergies  Allergen Reactions  . Avelox [Moxifloxacin Hcl In Nacl] Anaphylaxis  . Sulfa Antibiotics Hives  . Ace Inhibitors   . Doxycycline   . Fenofibrate     constipation  . Infed [Iron Dextran]     IV IRON  . Infergen [Interferon Alfacon-1]   . Statins     Liver function elevations  . Vancomycin Other (See Comments)    Flushing, erythema to neck and face, tolerated vanc at lower infusion rate  . Ciprofloxacin Other (See Comments)    Complains of sore mouth    Patient Measurements: Weight: 85.3 kg  Vital Signs: Temp: 98.3 F (36.8 C) (03/09 0519) Temp src: Oral (03/09 0519) BP: 120/40 mmHg (03/09 0519) Pulse Rate: 78 (03/09 0519) Intake/Output from previous day: 03/08 0701 - 03/09 0700 In: 480 [P.O.:480] Out: -   Recent Labs  11/27/13 0450 11/28/13 0545 11/29/13 0550  WBC 5.5  --   --   HGB 13.0  --   --   PLT 125*  --   --   CREATININE 0.98 0.92 0.91   Estimated Creatinine Clearance: 49.6 ml/min (by C-G formula based on Cr of 0.91). No results found for this basename: VANCOTROUGH, VANCOPEAK, VANCORANDOM, GENTTROUGH, GENTPEAK, GENTRANDOM, TOBRATROUGH, TOBRAPEAK, TOBRARND, AMIKACINPEAK, AMIKACINTROU, AMIKACIN,  in the last 72 hours   Microbiology: No results found for this or any previous visit (from the past 720 hour(s)).  Medical History: Past Medical History  Diagnosis Date  . Anemia, iron deficiency 08/07/2011  . Angiodysplasia of intestinal tract 08/07/2011  . Hypertension   . Arthritis   . Diverticulitis   . Phlebitis   . Macular degeneration   . TIA (transient ischemic attack)   . MI (myocardial infarction)   . Hiatal hernia   . AVM (arteriovenous malformation) of colon with hemorrhage   . Varicose veins of legs   . Cataract   . GERD (gastroesophageal reflux disease)   . DJD (degenerative joint disease)   . Diabetes mellitus     borderline  .  History of blood transfusion   . PONV (postoperative nausea and vomiting)   . hepatocellar ca dx'd 08/2012  . Edema leg   . Fatty liver   . Vitamin D deficiency     Assessment: 80 year old female with cellulitis of her right lower extremity.  Pt was started IV vancomycin on 11/26/13.  Patient's renal function is stable, Scr 0.91, CrCl ~50 ml/min. Afebrile. WBC 5.5K on 11/27/13.  Goal of Therapy:  Vancomycin trough level 10-15 mcg/ml  Plan:  Continue Vancomycin 1500 mg IV q24h Check steady state vancomycin trough tomorrow.  Nicole Cella, RPh Clinical Pharmacist Pager: [phone] 11/29/2013 2:36 PM

## 2013-11-29 NOTE — Discharge Summary (Signed)
Physician Discharge Summary  [name] [id number] DOB: 05/25/1928 DOA: 11/25/2013  PCP: Alesia Richards, MD  Admit date: 11/25/2013 Discharge date: 11/29/2013  Time spent: 40 minutes  Recommendations for Outpatient Follow-up:  1. Followup with SNF M.D. 2. BMP within one week check for renal function and electrolytes.  Discharge Diagnoses:  Active Problems:   Anemia, iron deficiency   Hypertension   Diabetes mellitus   Cirrhosis   Cellulitis of right lower extremity   Fall   Discharge Condition: Stable  Diet recommendation: Heart healthy  Filed Weights   11/27/13 0300 11/28/13 0601 11/29/13 0500  Weight: 85.276 kg (188 lb) 79.379 kg (175 lb) 78.654 kg (173 lb 6.4 oz)    History of present illness:  [name] is a 80 y.o. female  Who fell 1 week ago- she states her leg gave out- denies loss of consciousness/hitting head. She was able to get up, seen by EMS and knee was wrapped. Since then, she has had increasing swelling and pain in her right leg. She can bend her leg and bear weight with some pain. She is always cold but denies fevers.  Long standing appetite issues  Has varicose veins and had laser surgery done on left leg.  She has iron def anemia and follows with heme onc  In the Er, xrays of ankle and knees were done with out fracture. WBC count was normal. DVT study was negative- Hopsitalist were asked to admit for IV abx and pain control  She has a walker at home but was walking with out it when she fell  Hospital Course:   Cellulitis of the right lower extremity  -Recent fall with bruises to the right lower extremity.  -Patient developed warmth, redness and swelling consistent with cellulitis.  -Patient started on IV clindamycin, because of history of cirrhosis and increased risk of C. diff infection switched to vancomycin.  -Discharge on Keflex for 3 more days.  Fall  -Patient mentioned she does not fall, this is recent decline in her functional  status.  -Patient reported that happening because of her lower extremity swelling.  -PT/OT recommended skilled nursing facility patient will be discharged to [facility].  Diabetes mellitus  -Carbohydrate modified diet and SSI.   Cirrhosis  -Secondary to chronic active hepatitis, has history of HCC status post ablation.  -Compensated cirrhosis. -Has mild thrombocytopenia and hypoalbuminemia.  Lower extremity edema -This is secondary to hypoalbuminemia/cirrhosis. -Patient is on Bumex 2 mg as needed for leg swelling this was scheduled while she is in the hospital. -There is very well, her weight is down 15 pounds on discharge. -Diuretics scheduled (instead of when necessary), 0.5 mg of Bumex and 50 of Aldactone daily.   Procedures:  None  Consultations:  None  Discharge Exam: Filed Vitals:   11/29/13 0519  BP: 120/40  Pulse: 78  Temp: 98.3 F (36.8 C)  Resp: 18   General: Alert and awake, oriented x3, not in any acute distress. HEENT: anicteric sclera, pupils reactive to light and accommodation, EOMI CVS: S1-S2 clear, no murmur rubs or gallops Chest: clear to auscultation bilaterally, no wheezing, rales or rhonchi Abdomen: soft nontender, nondistended, normal bowel sounds, no organomegaly Extremities:  still right lower extremity bruises/redness Neuro: Cranial nerves II-XII intact, no focal neurological deficits  Discharge Instructions  Discharge Orders   Future Appointments Provider Department Dept Phone   12/13/2013 1:30 PM Kendell Bane, [place] [facility]   01/24/2014 2:30 PM Unk Pinto, MD [place] [facility] [phone]   07/22/2014 10:00 AM Unk Pinto, MD [place] [facility] [phone]   Future Orders Complete By Expires   Diet - low sodium heart healthy  As directed    Increase activity slowly  As directed        Medication List         ALPRAZolam 1 MG tablet   Commonly known as:  XANAX  Take 0.5 tablets (0.5 mg total) by mouth at bedtime as needed for sleep or anxiety.     atenolol 50 MG tablet  Commonly known as:  TENORMIN  Take 1 tablet (50 mg total) by mouth daily before breakfast.     azelastine 137 MCG/SPRAY nasal spray  Commonly known as:  ASTELIN  Place 2 sprays into both nostrils at bedtime. Use in each nostril as directed     bumetanide 0.5 MG tablet  Commonly known as:  BUMEX  Take 4 tablets (2 mg total) by mouth daily.     cephALEXin 500 MG capsule  Commonly known as:  KEFLEX  Take 1 capsule (500 mg total) by mouth 3 (three) times daily.     cholecalciferol 1000 UNITS tablet  Commonly known as:  VITAMIN D  Take 1,000 Units by mouth daily.     cyclobenzaprine 10 MG tablet  Commonly known as:  FLEXERIL  Take 10 mg by mouth 2 (two) times daily as needed for muscle spasms.     NITROSTAT 0.4 MG SL tablet  Generic drug:  nitroGLYCERIN  Place 0.4 mg under the tongue every 5 (five) minutes as needed for chest pain.     omeprazole 40 MG capsule  Commonly known as:  PRILOSEC  Take 40 mg by mouth daily as needed (for reflux).     spironolactone 50 MG tablet  Commonly known as:  ALDACTONE  Take 1 tablet (50 mg total) by mouth daily.     triamcinolone cream 0.1 %  Commonly known as:  KENALOG  Apply 1 application topically 2 (two) times daily as needed. Eczema       Allergies  Allergen Reactions  . Avelox [Moxifloxacin Hcl In Nacl] Anaphylaxis  . Sulfa Antibiotics Hives  . Ace Inhibitors   . Doxycycline   . Fenofibrate     constipation  . Infed [Iron Dextran]     IV IRON  . Infergen [Interferon Alfacon-1]   . Statins     Liver function elevations  . Vancomycin Other (See Comments)    Flushing, erythema to neck and face, tolerated vanc at lower infusion rate  . Ciprofloxacin Other (See Comments)    Complains of sore mouth       Follow-up Information   Follow up with MCKEOWN,WILLIAM DAVID, MD In 1 week.    Specialty:  Internal Medicine   Contact information:   [address] [place] [address] [phone]        The results of significant diagnostics from this hospitalization (including imaging, microbiology, ancillary and laboratory) are listed below for reference.    Significant Diagnostic Studies: Dg Chest 2 View  11/04/2013   ADDENDUM REPORT: 11/04/2013 15:08  ADDENDUM: The findings were discussed with AMANDA COLLIER on 11/04/2013 at 3:07 PM.   Electronically Signed   By: Shon Hale M.D.   On: 11/04/2013 15:08   11/04/2013   CLINICAL DATA:  80 year old female shortness of breath and cough. Initial encounter.  EXAM: CHEST  2 VIEW  COMPARISON:  09/04/2013 and earlier.  FINDINGS:  Moderate to large hiatal hernia with air-fluid level today. New right pleural effusion with confluent right basilar opacity. Pleural effusion volume felt to be small when accounting for underlying mild chronic elevation of the right hemidiaphragm. No pneumothorax or pulmonary edema. Stable cardiac size and mediastinal contours. Visualized tracheal air column is within normal limits. No acute osseous abnormality identified.  IMPRESSION: 1. New small right pleural effusion. 2. Chronic moderate to large hiatal hernia.  Electronically Signed: By: Lars Pinks M.D. On: 11/04/2013 15:02   Dg Knee 2 Views Right  11/25/2013   CLINICAL DATA:  Right leg pain  EXAM: RIGHT KNEE - 1-2 VIEW  COMPARISON:  None.  FINDINGS: There is no evidence of fracture, dislocation, or joint effusion. There is no evidence of arthropathy or other focal bone abnormality. Soft tissues are unremarkable.  IMPRESSION: Negative.   Electronically Signed   By: Franchot Gallo M.D.   On: 11/25/2013 10:49   Dg Ankle 2 Views Right  11/25/2013   CLINICAL DATA:  Fall.  Pain  EXAM: RIGHT ANKLE - 2 VIEW  COMPARISON:  None.  FINDINGS: Negative for acute fracture.  Normal alignment.  Diffuse soft tissue swelling.  Calcaneal spurring. Irregularity at the  base of the fifth metatarsal suggestive of old injury.  IMPRESSION: Negative for acute fracture.   Electronically Signed   By: Franchot Gallo M.D.   On: 11/25/2013 10:49   [place] Chest  11/12/2013   CLINICAL DATA:  Shortness of breath with exertion, small right pleural effusion seen on recent chest x-ray. Request therapeutic thoracentesis.  EXAM: CHEST ULTRASOUND  COMPARISON:  Chest x-ray from 11/04/2013  FINDINGS: Limited ultrasound of the chest finds of very small pleural effusion in the right chest. There was no safe percutaneous window for thoracentesis. Procedure was not performed.  IMPRESSION: Small right pleural effusion without safe access for thoracentesis.  Read by: Ascencion Dike PA-C   Electronically Signed   By: Arne Cleveland M.D.   On: 11/12/2013 14:51    Microbiology: No results found for this or any previous visit (from the past 240 hour(s)).   Labs: Basic Metabolic Panel:  Recent Labs Lab 11/25/13 0959 11/26/13 0517 11/27/13 0450 11/28/13 0545 11/29/13 0550  NA 142 141 143 141 142  K 4.0 4.2 3.9 3.3* 3.9  CL 107 109 107 103 104  CO2 23 22 24 26 24   GLUCOSE 115* 87 84 80 96  BUN 10 10 12 12 14   CREATININE 0.81 0.96 0.98 0.92 0.91  CALCIUM 9.5 8.8 8.8 8.7 9.0  MG  --   --   --   --  2.2   Liver Function Tests:  Recent Labs Lab 11/25/13 0959 11/26/13 0517  AST 45* 35  ALT 27 22  ALKPHOS 128* 105  BILITOT 1.4* 1.3*  PROT 6.8 5.8*  ALBUMIN 2.9* 2.5*   No results found for this basename: LIPASE, AMYLASE,  in the last 168 hours No results found for this basename: AMMONIA,  in the last 168 hours CBC:  Recent Labs Lab 11/25/13 0959 11/26/13 0517 11/27/13 0450  WBC 5.3 5.1 5.5  NEUTROABS 2.8  --   --   HGB 13.6 12.0 13.0  HCT 39.8 35.7* 37.4  MCV 96.4 96.2 96.4  PLT 140* 125* 125*   Cardiac Enzymes: No results found for this basename: CKTOTAL, CKMB, CKMBINDEX, TROPONINI,  in the last 168 hours BNP: BNP (last 3 results) No results found for this  basename: PROBNP,  in the last 8760 hours  CBG:  Recent Labs Lab 11/25/13 1728 11/25/13 2111 11/26/13 0629  GLUCAP 89 106* 83       Signed:  Meilani Edmundson A  [facility] 11/29/2013, 12:05 PM

## 2013-11-29 NOTE — Progress Notes (Signed)
CSW (Clinical Social Worker) prepared pt dc packet and placed with shadow chart. CSW arranged non-emergent ambulance transport. Pt, pt family, pt nurse, and facility informed. CSW signing off.  Shreyansh Tiffany, LCSWA [phone]  

## 2013-11-29 NOTE — Progress Notes (Signed)
OT Cancellation Note  Patient Details Name: [name] MRN: [id number] DOB: April 13, 1928   Cancelled Treatment:      Pt is Medicare and current D/C plan is SNF. No apparent immediate acute care OT needs, therefore will defer OT to SNF. If OT eval is needed please call Acute Rehab Dept. at [phone] or text page OT at [phone].     Almon Register [phone] 11/29/2013, 7:54 AM

## 2013-12-13 ENCOUNTER — Ambulatory Visit: Payer: Medicare Other | Admitting: Podiatry

## 2013-12-16 ENCOUNTER — Encounter: Payer: Self-pay | Admitting: Internal Medicine

## 2013-12-16 ENCOUNTER — Ambulatory Visit (INDEPENDENT_AMBULATORY_CARE_PROVIDER_SITE_OTHER): Payer: Medicare Other | Admitting: Internal Medicine

## 2013-12-16 VITALS — BP 114/62 | HR 68 | Temp 98.1°F | Resp 16 | Ht 66.0 in | Wt 174.2 lb

## 2013-12-16 DIAGNOSIS — Z79899 Other long term (current) drug therapy: Secondary | ICD-10-CM

## 2013-12-16 DIAGNOSIS — E119 Type 2 diabetes mellitus without complications: Secondary | ICD-10-CM

## 2013-12-16 DIAGNOSIS — E785 Hyperlipidemia, unspecified: Secondary | ICD-10-CM

## 2013-12-16 DIAGNOSIS — I10 Essential (primary) hypertension: Secondary | ICD-10-CM

## 2013-12-16 DIAGNOSIS — E559 Vitamin D deficiency, unspecified: Secondary | ICD-10-CM

## 2013-12-16 LAB — CBC WITH DIFFERENTIAL/PLATELET
BASOS ABS: 0.1 10*3/uL (ref 0.0–0.1)
Basophils Relative: 1 % (ref 0–1)
Eosinophils Absolute: 0.3 10*3/uL (ref 0.0–0.7)
Eosinophils Relative: 4 % (ref 0–5)
HCT: 41.2 % (ref 36.0–46.0)
Hemoglobin: 13.8 g/dL (ref 12.0–15.0)
Lymphocytes Relative: 36 % (ref 12–46)
Lymphs Abs: 2.3 10*3/uL (ref 0.7–4.0)
MCH: 31.4 pg (ref 26.0–34.0)
MCHC: 33.5 g/dL (ref 30.0–36.0)
MCV: 93.8 fL (ref 78.0–100.0)
Monocytes Absolute: 0.7 10*3/uL (ref 0.1–1.0)
Monocytes Relative: 10 % (ref 3–12)
Neutro Abs: 3.2 10*3/uL (ref 1.7–7.7)
Neutrophils Relative %: 49 % (ref 43–77)
PLATELETS: 152 10*3/uL (ref 150–400)
RBC: 4.39 MIL/uL (ref 3.87–5.11)
RDW: 13.4 % (ref 11.5–15.5)
WBC: 6.5 10*3/uL (ref 4.0–10.5)

## 2013-12-16 MED ORDER — BUMETANIDE 0.5 MG PO TABS: ORAL_TABLET | ORAL | Status: DC

## 2013-12-16 MED ORDER — SPIRONOLACTONE 50 MG PO TABS: ORAL_TABLET | ORAL | Status: DC

## 2013-12-16 NOTE — Progress Notes (Signed)
Patient ID: [name], female   DOB: 1928-01-22, 80 y.o.   MRN: [id number]    This very nice 80 y.o. [facility] presents for 3 month follow up with Hypertension, Hyperlipidemia, Pre-Diabetes and Vitamin D Deficiency.    HTN predates since   . BP has been controlled at home. Today's BP: 114/62 mmHg . Patient denies any cardiac type chest pain, palpitations, dyspnea/orthopnea/PND, dizziness, claudication, or dependent edema.   Hyperlipidemia is controlled with diet & meds. Last Cholesterol was 126, Triglycerides were 84, HDL 40 and LDL 69 in Feb at goal. Patient denies myalgias or other med SE's.    Also, the patient has history of PreDiabetes  since 2004 with last A1c of  5.7% in Feb 2014. Patient denies any symptoms of reactive hypoglycemia, diabetic polys, paresthesias or visual blurring.   Patient was recently hospitalized with a cellulitis of the RLE at [facility] and Tx';d with Parenteral Abx's, then she was at [facility] for convalescence and therapy. She returns her to re-establish her healthcare.   Further, Patient has history of Vitamin D Deficiency of 21 in 2008 and with last vitamin D of 66 in Oct 2014. Patient supplements vitamin D without any suspected side-effects.    Medication List       ALPRAZolam 1 MG tablet  Commonly known as:  XANAX  Take 0.5 tablets (0.5 mg total) by mouth at bedtime as needed for sleep or anxiety.     atenolol 50 MG tablet  Commonly known as:  TENORMIN  Take 1 tablet (50 mg total) by mouth daily before breakfast.     azelastine 137 MCG/SPRAY nasal spray  Commonly known as:  ASTELIN  Place 2 sprays into both nostrils at bedtime. Use in each nostril as directed     bumetanide 0.5 MG tablet  Commonly known as:  BUMEX  Take 1 to 2 tablets daily as directed for fluid and swelling     cholecalciferol 1000 UNITS tablet  Commonly known as:  VITAMIN D  Take 1,000 Units by mouth daily.     cyclobenzaprine 10 MG tablet  Commonly known as:  FLEXERIL  Take  10 mg by mouth 2 (two) times daily as needed for muscle spasms.     NITROSTAT 0.4 MG SL tablet  Generic drug:  nitroGLYCERIN  Place 0.4 mg under the tongue every 5 (five) minutes as needed for chest pain.     omeprazole 40 MG capsule  Commonly known as:  PRILOSEC  Take 40 mg by mouth daily as needed (for reflux).     spironolactone 50 MG tablet  Commonly known as:  ALDACTONE  Take 1 or 2 tablets daily as directed for fluid and swelling     triamcinolone cream 0.1 %  Commonly known as:  KENALOG  Apply 1 application topically 2 (two) times daily as needed. Eczema       Allergies  Allergen Reactions  . Avelox [Moxifloxacin Hcl In Nacl] Anaphylaxis  . Sulfa Antibiotics Hives  . Ace Inhibitors   . Doxycycline   . Fenofibrate     constipation  . Infed [Iron Dextran]     IV IRON  . Infergen [Interferon Alfacon-1]   . Statins     Liver function elevations  . Vancomycin Other (See Comments)    Flushing, erythema to neck and face, tolerated vanc at lower infusion rate  . Ciprofloxacin Other (See Comments)    Complains of sore mouth    PMHx:   Past Medical History  Diagnosis Date  . Anemia, iron deficiency 08/07/2011  . Angiodysplasia of intestinal tract 08/07/2011  . Hypertension   . Arthritis   . Diverticulitis   . Phlebitis   . Macular degeneration   . TIA (transient ischemic attack)   . MI (myocardial infarction)   . Hiatal hernia   . AVM (arteriovenous malformation) of colon with hemorrhage   . Varicose veins of legs   . Cataract   . GERD (gastroesophageal reflux disease)   . DJD (degenerative joint disease)   . Diabetes mellitus     borderline  . History of blood transfusion   . PONV (postoperative nausea and vomiting)   . hepatocellar ca dx'd 08/2012  . Edema leg   . Fatty liver   . Vitamin D deficiency     FHx:    Reviewed / unchanged  SHx:    Reviewed / unchanged   Systems Review: Constitutional: Denies fever, chills, wt changes, headaches,  insomnia, fatigue, night sweats, change in appetite. Eyes: Denies redness, blurred vision, diplopia, discharge, itchy, watery eyes.  ENT: Denies discharge, congestion, post nasal drip, epistaxis, sore throat, earache, hearing loss, dental pain, tinnitus, vertigo, sinus pain, snoring.  CV: Denies chest pain, palpitations, irregular heartbeat, syncope, dyspnea, diaphoresis, orthopnea, PND, claudication, edema. Respiratory: denies cough, dyspnea, DOE, pleurisy, hoarseness, laryngitis, wheezing.  Gastrointestinal: Denies dysphagia, odynophagia, heartburn, reflux, water brash, abdominal pain or cramps, nausea, vomiting, bloating, diarrhea, constipation, hematemesis, melena, hematochezia,  or hemorrhoids. Genitourinary: Denies dysuria, frequency, urgency, nocturia, hesitancy, discharge, hematuria, flank pain. Musculoskeletal: Denies arthralgias, myalgias, stiffness, jt. swelling, pain, limp, strain/sprain.  Skin: Denies pruritus, rash, hives, warts, acne, eczema, change in skin lesion(s). Neuro: No weakness, tremor, incoordination, spasms, paresthesia, or pain. Psychiatric: Denies confusion, memory loss, or sensory loss. Endo: Denies change in weight, skin, hair change.  Heme/Lymph: No excessive bleeding, bruising, or enlarged lymph nodes.   Exam:  BP 114/62  Pulse 68  Temp98.1 F   Resp 16  Ht 5\' 6"    Wt 174 lb 3.2 oz   BMI 28.13 kg/m2  Appears well nourished - in no distress. Eyes: PERRLA, EOMs, conjunctiva no swelling or erythema. Sinuses: No frontal/maxillary tenderness ENT/Mouth: EAC's clear, TM's nl w/o erythema, bulging. Nares clear w/o erythema, swelling, exudates. Oropharynx clear without erythema or exudates. Oral hygiene is good. Tongue normal, non obstructing. Hearing intact.  Neck: Supple. Thyroid nl. Car 2+/2+ without bruits, nodes or JVD. Chest: Respirations nl with BS clear & equal w/o rales, rhonchi, wheezing or stridor.  Cor: Heart sounds normal w/ regular rate and rhythm  without sig. murmurs, gallops, clicks, or rubs. Peripheral pulses normal and equal  without edema. Noted moderately severe varicosities of both LE's. Abdomen: Soft & bowel sounds normal. Non-tender w/o guarding, rebound, hernias, masses, or organomegaly.  Lymphatics: Unremarkable.  Musculoskeletal: Full ROM all peripheral extremities, joint stability, 5/5 strength, and normal gait.  Skin: Warm, dry without exposed rashes, lesions, ecchymosis apparent. No sings of cellulitis of the extremities  Neuro: Cranial nerves intact, reflexes equal bilaterally. Sensory-motor testing grossly intact. Tendon reflexes grossly intact.  Pysch: Alert & oriented x 3. Insight and judgement nl & appropriate. No ideations.  Assessment and Plan:  1. Hypertension - Continue monitor blood pressure at home. Continue diet/meds same.  2. Hyperlipidemia - Continue diet/meds, exercise,& lifestyle modifications. Continue monitor periodic cholesterol/liver & renal functions   3. Pre-diabetes/Insulin Resistance - Continue diet, exercise, lifestyle modifications. Monitor appropriate labs.  3. Diabetes - continue recommend prudent low glycemic diet, weight control, regular  exercise, diabetic monitoring and periodic eye exams.  4. Vitamin D Deficiency - Continue supplementation.  Recommended regular exercise, BP monitoring, weight control, and discussed med and SE's. Recommended labs to assess and monitor clinical status. Further disposition pending results of labs. 1 Month F/U

## 2013-12-16 NOTE — Patient Instructions (Signed)

## 2013-12-17 LAB — INSULIN, FASTING: Insulin fasting, serum: 186 u[IU]/mL — ABNORMAL HIGH (ref 3–28)

## 2013-12-17 LAB — HEPATIC FUNCTION PANEL
ALBUMIN: 3.2 g/dL — AB (ref 3.5–5.2)
ALT: 32 U/L (ref 0–35)
AST: 45 U/L — ABNORMAL HIGH (ref 0–37)
Alkaline Phosphatase: 140 U/L — ABNORMAL HIGH (ref 39–117)
BILIRUBIN TOTAL: 0.9 mg/dL (ref 0.2–1.2)
Bilirubin, Direct: 0.2 mg/dL (ref 0.0–0.3)
Indirect Bilirubin: 0.7 mg/dL (ref 0.2–1.2)
Total Protein: 6.9 g/dL (ref 6.0–8.3)

## 2013-12-17 LAB — BASIC METABOLIC PANEL WITH GFR
BUN: 16 mg/dL (ref 6–23)
CALCIUM: 10.1 mg/dL (ref 8.4–10.5)
CO2: 26 mEq/L (ref 19–32)
Chloride: 101 mEq/L (ref 96–112)
Creat: 1.01 mg/dL (ref 0.50–1.10)
GFR, Est African American: 59 mL/min — ABNORMAL LOW
GFR, Est Non African American: 52 mL/min — ABNORMAL LOW
Glucose, Bld: 150 mg/dL — ABNORMAL HIGH (ref 70–99)
Potassium: 4.1 mEq/L (ref 3.5–5.3)
Sodium: 135 mEq/L (ref 135–145)

## 2013-12-17 LAB — HEMOGLOBIN A1C
HEMOGLOBIN A1C: 5.7 % — AB (ref <5.7)
Mean Plasma Glucose: 117 mg/dL — ABNORMAL HIGH (ref <117)

## 2013-12-17 LAB — TSH: TSH: 3.25 u[IU]/mL (ref 0.350–4.500)

## 2013-12-17 LAB — LIPID PANEL
CHOL/HDL RATIO: 3.4 ratio
CHOLESTEROL: 139 mg/dL (ref 0–200)
HDL: 41 mg/dL (ref >39)
LDL Cholesterol: 79 mg/dL (ref 0–99)
Triglycerides: 94 mg/dL (ref <150)
VLDL: 19 mg/dL (ref 0–40)

## 2013-12-17 LAB — MAGNESIUM: MAGNESIUM: 1.7 mg/dL (ref 1.5–2.5)

## 2013-12-17 LAB — VITAMIN D 25 HYDROXY (VIT D DEFICIENCY, FRACTURES): VIT D 25 HYDROXY: 56 ng/mL (ref 30–89)

## 2014-01-13 ENCOUNTER — Other Ambulatory Visit: Payer: Self-pay | Admitting: Physician Assistant

## 2014-01-19 ENCOUNTER — Ambulatory Visit (INDEPENDENT_AMBULATORY_CARE_PROVIDER_SITE_OTHER): Payer: Medicare Other | Admitting: Podiatry

## 2014-01-19 ENCOUNTER — Encounter: Payer: Self-pay | Admitting: Podiatry

## 2014-01-19 VITALS — BP 131/69 | HR 62 | Resp 12

## 2014-01-19 DIAGNOSIS — M79609 Pain in unspecified limb: Secondary | ICD-10-CM

## 2014-01-19 DIAGNOSIS — B351 Tinea unguium: Secondary | ICD-10-CM

## 2014-01-19 NOTE — Progress Notes (Signed)
Patient ID: [name], female   DOB: 1928-07-19, 80 y.o.   MRN: [id number] Subjective: Patient presents for ongoing debridement of painful hallux nails  Objective: Hallux nails are hypertrophic, incurvated, discolored and tender upon palpation.  Assessment: Symptomatic onychomycoses 1 through 5  Plan: Nails x5 are debrided without a bleeding.  Reappoint at three-month intervals.

## 2014-01-20 ENCOUNTER — Ambulatory Visit (INDEPENDENT_AMBULATORY_CARE_PROVIDER_SITE_OTHER): Payer: Medicare Other | Admitting: Internal Medicine

## 2014-01-20 ENCOUNTER — Encounter: Payer: Self-pay | Admitting: Internal Medicine

## 2014-01-20 VITALS — BP 132/76 | HR 60 | Temp 98.2°F | Resp 16 | Ht 66.0 in | Wt 182.8 lb

## 2014-01-20 DIAGNOSIS — I10 Essential (primary) hypertension: Secondary | ICD-10-CM

## 2014-01-20 DIAGNOSIS — I872 Venous insufficiency (chronic) (peripheral): Secondary | ICD-10-CM

## 2014-01-20 NOTE — Progress Notes (Signed)
Subjective:    Patient ID: [name], female    DOB: 13-Jul-1928, 80 y.o.   MRN: [id number]  Hypertension   Very nice 80 yo WWF with multiple problems including HTN, ASHD, Chronic Venous Insufficiency and severe Varicosities, PreDiabetes, and Hepatoma (S/P RFA) who returns for F/U after adding Bumex and aldactone back to her regimen. Patient is doing well without complaints . Systems review is totally negative.  Medication Sig  . ALPRAZolam (XANAX) 1 MG tablet Take 0.5 tablets (0.5 mg total) by mouth at bedtime as needed for sleep or anxiety.  Marland Kitchen atenolol (TENORMIN) 50 MG tablet TAKE 1 TABLET (50 MG TOTAL) BY MOUTH DAILY BEFORE BREAKFAST.  Marland Kitchen azelastine (ASTELIN)  nasal spray Place 2 sprays into both nostrils at bedtime. Use in each nostril as directed  . bumetanide (BUMEX) 0.5 MG tablet Take 1 to 2 tablets daily as directed for fluid and swelling  . cholecalciferol (VITAMIN D) 1000 UNITS  Take 1,000 Units by mouth daily.  . cyclobenzaprine (FLEXERIL) 10 MG  Take 10 mg by mouth 2 (two) times daily as needed for muscle spasms.   Marland Kitchen NITROSTAT 0.4 MG SL tablet Place 0.4 mg under the tongue every 5 (five) minutes as needed for chest pain.   Marland Kitchen omeprazole (PRILOSEC) 40 MG capsule Take 40 mg by mouth daily as needed (for reflux).   Marland Kitchen spironolactone (ALDACTONE) 50 MG  Take 1 or 2 tablets daily as directed for fluid and swelling  . triamcinolone cream (KENALOG) 0.1 % Apply 1 application topically 2 (two) times daily as needed. Eczema   Allergies  Allergen Reactions  . Avelox [Moxifloxacin Hcl In Nacl] Anaphylaxis  . Sulfa Antibiotics Hives  . Ace Inhibitors   . Doxycycline   . Fenofibrate     constipation  . Infed [Iron Dextran]     IV IRON  . Infergen [Interferon Alfacon-1]   . Statins     Liver function elevations  . Vancomycin Other (See Comments)    Flushing, erythema to neck and face, tolerated vanc at lower infusion rate  . Ciprofloxacin Other (See Comments)    Complains of sore  mouth   Past Medical History  Diagnosis Date  . Anemia, iron deficiency 08/07/2011  . Angiodysplasia of intestinal tract 08/07/2011  . Hypertension   . Arthritis   . Diverticulitis   . Phlebitis   . Macular degeneration   . TIA (transient ischemic attack)   . MI (myocardial infarction)   . Hiatal hernia   . AVM (arteriovenous malformation) of colon with hemorrhage   . Varicose veins of legs   . Cataract   . GERD (gastroesophageal reflux disease)   . DJD (degenerative joint disease)   . Diabetes mellitus     borderline  . History of blood transfusion   . PONV (postoperative nausea and vomiting)   . hepatocellar ca dx'd 08/2012  . Edema leg   . Fatty liver   . Vitamin D deficiency    Review of Systems  Neg as above  Objective:   Physical Exam BP 132/76  Pulse 60  Temp(Src) 98.2 F (36.8 C) (Temporal)  Resp 16  Ht 5\' 6"  (1.676 m)  Wt 182 lb 12.8 oz (82.918 kg)  BMI 29.52 kg/m2 HEENT - Eac's patent. TM's Nl.EOM's full. PERRLA. NasoOroPharynx clear. Neck - supple. Nl Thyroid. No bruits nodes JVD Chest - Clear equal BS Cor - Nl HS. RRR w/o sig MGR. PP obscured by 1-2 (+) pedal edema and note bilat  lage varicosities. Abd - No palpable organomegaly, masses or tenderness. BS nl. MS- FROM. w/o deformities. Muscle power tone and bulk Nl. Gait Nl. Neuro - No obvious Cr N abnormalities. Sensory, motor and Cerebellar functions appear Nl w/o focal abnormalities.  Assessment & Plan:   1. Hypertension  2. Chronic venous insufficiency  Continue diet and meds same

## 2014-01-24 ENCOUNTER — Other Ambulatory Visit: Payer: Self-pay | Admitting: *Deleted

## 2014-01-24 ENCOUNTER — Ambulatory Visit: Payer: Self-pay | Admitting: Internal Medicine

## 2014-01-24 MED ORDER — ATENOLOL 50 MG PO TABS: 50.0000 mg | ORAL_TABLET | Freq: Every day | ORAL | Status: DC

## 2014-03-03 ENCOUNTER — Telehealth: Payer: Self-pay

## 2014-03-03 NOTE — Telephone Encounter (Signed)
Pt called c/o mouth being sore. Requesting Rx to help. Magic Mouthwash w/ hydrocortisone 8oz q1-2 hrs swish & swallow prn. Called in to [facility]. Pt aware.

## 2014-03-04 ENCOUNTER — Other Ambulatory Visit: Payer: Medicare Other

## 2014-03-04 ENCOUNTER — Inpatient Hospital Stay: Admission: RE | Admit: 2014-03-04 | Payer: Medicare Other | Source: Ambulatory Visit

## 2014-03-07 ENCOUNTER — Ambulatory Visit
Admission: RE | Admit: 2014-03-07 | Discharge: 2014-03-07 | Disposition: A | Discharge status: Home / Self Care | Payer: Medicare Other | Source: Ambulatory Visit | Attending: Gastroenterology | Admitting: Gastroenterology

## 2014-03-07 DIAGNOSIS — R945 Abnormal results of liver function studies: Secondary | ICD-10-CM

## 2014-03-07 DIAGNOSIS — R7989 Other specified abnormal findings of blood chemistry: Secondary | ICD-10-CM

## 2014-03-07 DIAGNOSIS — R932 Abnormal findings on diagnostic imaging of liver and biliary tract: Secondary | ICD-10-CM

## 2014-03-07 DIAGNOSIS — R109 Unspecified abdominal pain: Secondary | ICD-10-CM

## 2014-03-07 MED ORDER — IOHEXOL 350 MG/ML SOLN
100.0000 mL | Freq: Once | INTRAVENOUS | Status: AC | PRN
  Administered 2014-03-07: 100 mL via INTRAVENOUS

## 2014-03-23 ENCOUNTER — Emergency Department (INDEPENDENT_AMBULATORY_CARE_PROVIDER_SITE_OTHER)
Admission: EM | Admit: 2014-03-23 | Discharge: 2014-03-23 | Disposition: A | Discharge status: Home / Self Care | Payer: Medicare Other | Source: Home / Self Care | Attending: Emergency Medicine | Admitting: Emergency Medicine

## 2014-03-23 ENCOUNTER — Encounter (HOSPITAL_COMMUNITY): Payer: Self-pay | Admitting: Emergency Medicine

## 2014-03-23 DIAGNOSIS — K140 Glossitis: Secondary | ICD-10-CM

## 2014-03-23 MED ORDER — METANX 3-35-2 MG PO TABS: 1.0000 | ORAL_TABLET | Freq: Every day | ORAL | Status: DC

## 2014-03-23 MED ORDER — CHLORHEXIDINE GLUCONATE 0.12 % MT SOLN: 15.0000 mL | Freq: Two times a day (BID) | OROMUCOSAL | Status: DC

## 2014-03-23 NOTE — ED Notes (Signed)
Patient c/o her mouth is sore x 3 weeks. Has been seeing Dr Melford Aase and Dr Watt Climes for multiple issues

## 2014-03-23 NOTE — ED Provider Notes (Signed)
  Chief Complaint   Chief Complaint  Patient presents with  . Oral Pain    History of Present Illness   [name] is an 80 year old female who's had a two-week history of a sore tongue, coating of the tongue, and stinging and burning of her lower lip. She denies any sores inside her mouth, there is no tooth pain, no sore throat or difficulty swallowing. No hoarseness or cough. She denies any adenopathy. She called her primary care physician, Dr. Unk Pinto who prescribed [place] Magic mouthwash. She tried this for a few days and it did not help plus it caused diarrhea, so she stopped it. She also has a history of iron deficiency.  Review of Systems   Other than as noted above, the patient denies any of the following symptoms: Systemic:  No fevers or chills. Eye:  No redness, pain, discharge, itching, blurred vision, or diplopia. ENT:  No headache, nasal congestion, sneezing, itching, epistaxis, ear pain, decreased hearing, ringing in ears, vertigo, or tinnitus.  No oral lesions, sore throat, or hoarseness. Neck:  No neck pain or adenopathy. Skin:  No rash or itching.  [place]   Past medical history, family history, social history, meds, and allergies were reviewed.   Physical Examination     Vital signs:  BP 158/72  Pulse 79  Temp(Src) 98.6 F (37 C) (Oral)  Resp 18  SpO2 100% General:  Alert and oriented.  In no distress.  Skin warm and dry. Eye:  PERRL, full EOMs, lids and conjunctiva normal.   ENT:  TMs and canals clear.  Nasal mucosa not congested and without drainage.  Mucous membranes moist, no oral lesions, normal dentition, pharynx clear.  No cranial or facial pain to palplation. Exam of the tongue reveals a white, hairy tongue without any other lesions. Neck:  Supple, full ROM.  No adenopathy, tenderness or mass.  Thyroid normal. Lungs:  Breath sounds clear and equal bilaterally.  No wheezes, rales or rhonchi. Heart:  Rhythm regular, without extrasystoles.  No  gallops or murmers. Skin:  Clear, warm and dry.   Assessment   The encounter diagnosis was Glossitis.  With white, hairy tongue.  Plan    1.  Meds:  The following meds were prescribed:   Discharge Medication List as of 03/23/2014  2:09 PM    START taking these medications   Details  multivitamin (METANX) 3-35-2 MG TABS tablet Take 1 tablet by mouth daily., Starting 03/23/2014, Until Discontinued, Normal    chlorhexidine (PERIDEX) 0.12 % solution Use as directed 15 mLs in the mouth or throat 2 (two) times daily., Starting 03/23/2014, Until Discontinued, Normal        2.  Patient Education/Counseling:  The patient was given appropriate handouts, self care instructions, and instructed in symptomatic relief.  Instructed to use a tongue scraper 3 times a day.  3.  Follow up:  The patient was told to follow up here if no better in 3 to 4 days, or sooner if becoming worse in any way, and given some red flag symptoms such as any nonhealing ulcerations inside the mouth which would prompt immediate return.  Followup with Dr. Melford Aase next week.     Harden Mo, MD 03/23/14 1540

## 2014-03-23 NOTE — Discharge Instructions (Signed)
Glossitis Glossitis is an inflammation of the tongue. Changes in the appearance of the tongue may be a primary tongue disorder. This means the problem is only in the tongue. Glossitis may be a symptom of other disorders. CAUSES   Excessive alcohol.  Multiple allergies.  Infections.  Tobacco and nicotine use.  Anemia.  Mechanical injury.  Spicy foods.  Vitamin B deficiency.  Damage from chemicals or hot food or drink. SYMPTOMS  There may be swelling and color changes in the tongue. Sometimes the surface of the tongue may look smooth. This disorder may be painless. But the tongue is usually sore and tender. It can be fiery red if condition is caused by deficiency of B vitamins. It is sometimes pale if there is anemia. Anemia means there are not enough red blood cells. There may be problems with chewing, swallowing and speaking. In some cases, glossitis may result in severe tongue swelling that blocks the airway. DIAGNOSIS  The diagnosis of glossitis is made easily by physical exam and asking for a history. Sometimes blood tests may be done. TREATMENT   The goal of treatment is to reduce inflammation. Hospitalization is usually not necessary unless tongue swelling is severe.  Good oral hygiene is important. This means good tooth brushing at least twice a day, and flossing daily for treatment and prevention.  Corticosteroids such as prednisone may be given to reduce the redness and soreness.  Medications may be prescribed if the cause of glossitis is an infection.  Other problems such as anemia and nutritional deficiencies are treated. This may be a dietary change or vitamin supplements. Avoid hot or spicy foods, alcohol, and tobacco. This lessens the discomfort.  Avoid anything that is irritating to your mouth or tongue. Glossitis usually responds well to treatment if the cause of it is removed or treated.  SEEK IMMEDIATE MEDICAL CARE IF:   Symptoms of glossitis persist for  longer than 10 days.  Tongue swelling is severe and breathing, speaking, chewing, or swallowing difficulties are present.  You have no relief from medications given.  You develop difficulties breathing. Document Released: 08/30/2002 Document Revised: 12/02/2011 Document Reviewed: 10/14/2008 [facility] Patient Information 2015 [place], [place]. This information is not intended to replace advice given to you by your health care provider. Make sure you discuss any questions you have with your health care provider.  Scrape tongue with scraper 3 times daily.

## 2014-03-31 ENCOUNTER — Encounter: Payer: Self-pay | Admitting: Emergency Medicine

## 2014-03-31 ENCOUNTER — Ambulatory Visit (INDEPENDENT_AMBULATORY_CARE_PROVIDER_SITE_OTHER): Payer: Medicare Other | Admitting: Emergency Medicine

## 2014-03-31 ENCOUNTER — Other Ambulatory Visit: Payer: Self-pay | Admitting: Emergency Medicine

## 2014-03-31 VITALS — BP 118/70 | HR 64 | Temp 98.8°F | Resp 16 | Ht 66.25 in | Wt 181.0 lb

## 2014-03-31 DIAGNOSIS — I10 Essential (primary) hypertension: Secondary | ICD-10-CM

## 2014-03-31 DIAGNOSIS — R7309 Other abnormal glucose: Secondary | ICD-10-CM

## 2014-03-31 DIAGNOSIS — E538 Deficiency of other specified B group vitamins: Secondary | ICD-10-CM

## 2014-03-31 DIAGNOSIS — R5383 Other fatigue: Secondary | ICD-10-CM

## 2014-03-31 DIAGNOSIS — D649 Anemia, unspecified: Secondary | ICD-10-CM

## 2014-03-31 DIAGNOSIS — R5381 Other malaise: Secondary | ICD-10-CM

## 2014-03-31 LAB — CBC WITH DIFFERENTIAL/PLATELET
BASOS PCT: 1 % (ref 0–1)
Basophils Absolute: 0.1 10*3/uL (ref 0.0–0.1)
EOS ABS: 0.2 10*3/uL (ref 0.0–0.7)
EOS PCT: 3 % (ref 0–5)
HCT: 39.2 % (ref 36.0–46.0)
Hemoglobin: 13.3 g/dL (ref 12.0–15.0)
LYMPHS ABS: 1.8 10*3/uL (ref 0.7–4.0)
LYMPHS PCT: 27 % (ref 12–46)
MCH: 32 pg (ref 26.0–34.0)
MCHC: 33.9 g/dL (ref 30.0–36.0)
MCV: 94.2 fL (ref 78.0–100.0)
Monocytes Absolute: 0.9 10*3/uL (ref 0.1–1.0)
Monocytes Relative: 14 % — ABNORMAL HIGH (ref 3–12)
NEUTROS ABS: 3.6 10*3/uL (ref 1.7–7.7)
Neutrophils Relative %: 55 % (ref 43–77)
PLATELETS: 150 10*3/uL (ref 150–400)
RBC: 4.16 MIL/uL (ref 3.87–5.11)
RDW: 13.4 % (ref 11.5–15.5)
WBC: 6.6 10*3/uL (ref 4.0–10.5)

## 2014-03-31 LAB — BASIC METABOLIC PANEL WITH GFR
BUN: 15 mg/dL (ref 6–23)
CHLORIDE: 103 meq/L (ref 96–112)
CO2: 25 mEq/L (ref 19–32)
Calcium: 10 mg/dL (ref 8.4–10.5)
Creat: 0.92 mg/dL (ref 0.50–1.10)
GFR, EST NON AFRICAN AMERICAN: 58 mL/min — AB
GFR, Est African American: 67 mL/min
Glucose, Bld: 106 mg/dL — ABNORMAL HIGH (ref 70–99)
POTASSIUM: 4.4 meq/L (ref 3.5–5.3)
SODIUM: 134 meq/L — AB (ref 135–145)

## 2014-03-31 LAB — HEPATIC FUNCTION PANEL
ALBUMIN: 3.4 g/dL — AB (ref 3.5–5.2)
ALT: 38 U/L — ABNORMAL HIGH (ref 0–35)
AST: 56 U/L — ABNORMAL HIGH (ref 0–37)
Alkaline Phosphatase: 128 U/L — ABNORMAL HIGH (ref 39–117)
Bilirubin, Direct: 0.3 mg/dL (ref 0.0–0.3)
Indirect Bilirubin: 0.7 mg/dL (ref 0.2–1.2)
TOTAL PROTEIN: 6.8 g/dL (ref 6.0–8.3)
Total Bilirubin: 1 mg/dL (ref 0.2–1.2)

## 2014-03-31 LAB — IRON AND TIBC
%SAT: 35 % (ref 20–55)
IRON: 106 ug/dL (ref 42–145)
TIBC: 301 ug/dL (ref 250–470)
UIBC: 195 ug/dL (ref 125–400)

## 2014-03-31 LAB — HEMOGLOBIN A1C
HEMOGLOBIN A1C: 5.7 % — AB (ref <5.7)
MEAN PLASMA GLUCOSE: 117 mg/dL — AB (ref <117)

## 2014-03-31 LAB — TSH: TSH: 1.827 u[IU]/mL (ref 0.350–4.500)

## 2014-03-31 MED ORDER — HYOSCYAMINE SULFATE 0.125 MG PO TABS: 0.1250 mg | ORAL_TABLET | ORAL | Status: DC | PRN

## 2014-03-31 MED ORDER — NYSTATIN 100000 UNIT/ML MT SUSP: 5.0000 mL | Freq: Four times a day (QID) | OROMUCOSAL | Status: DC

## 2014-03-31 NOTE — Patient Instructions (Addendum)
Thrush, Adult  Ritta Slot is an infection that can happen on the mouth, throat, tongue, or other areas. It causes white patches to form on the mouth and tongue. HOME CARE  Only take medicine as told by your doctor. You may be given medicine to swallow or to apply right on the area.  Eat plain yogurt that contains live cultures (check the label).  Rinse your mouth many times a day with a warm saltwater rinse. To make the rinse, mix 1 teaspoon (6 g) of salt in 8 ounces (0.2 L) of warm water. To reduce pain:  Drink cold liquids such as water or iced tea.  Eat frozen ice pops or frozen juices.  Eat foods that are easy to swallow, such as gelatin or ice cream.  Drink from a straw if the patches are painful. If you are breastfeeding:  Clean your nipples with an antifungal medicine.  Dry your nipples after breastfeeding.  Use an ointment called lanolin to help relieve nipple soreness. If you wear dentures:  Take out your dentures before going to bed.  Brush them thoroughly.  Soak them in a denture cleaner. GET HELP IF:   Your problems are getting worse.  Your problems are not improving within 7 days of starting treatment.  Your infection is spreading. This may show as white patches on the skin outside of your mouth.  You are nursing and have redness and pain in the nipples. MAKE SURE YOU:  Understand these instructions.  Will watch your condition.  Will get help right away if you are not doing well or get worse. Document Released: 12/04/2009 Document Revised: 06/30/2013 Document Reviewed: 04/12/2013 [facility] Patient Information 2015 [place], [place]. This information is not intended to replace advice given to you by your health care provider. Make sure you discuss any questions you have with your health care provider.  Diarrhea Diarrhea is watery poop (stool). It can make you feel weak, tired, thirsty, or give you a dry mouth (signs of dehydration). Watery poop is a sign of  another problem, most often an infection. It often lasts 2-3 days. It can last longer if it is a sign of something serious. Take care of yourself as told by your doctor. HOME CARE   Drink 1 cup (8 ounces) of fluid each time you have watery poop.  Do not drink the following fluids:  Those that contain simple sugars (fructose, glucose, galactose, lactose, sucrose, maltose).  Sports drinks.  Fruit juices.  Whole milk products.  Sodas.  Drinks with caffeine (coffee, tea, soda) or alcohol.  Oral rehydration solution may be used if the doctor says it is okay. You may make your own solution. Follow this recipe:   - teaspoon table salt.   teaspoon baking soda.   teaspoon salt substitute containing potassium chloride.  1 tablespoons sugar.  1 liter (34 ounces) of water.  Avoid the following foods:  High fiber foods, such as raw fruits and vegetables.  Nuts, seeds, and whole grain breads and cereals.   Those that are sweetened with sugar alcohols (xylitol, sorbitol, mannitol).  Try eating the following foods:  Starchy foods, such as rice, toast, pasta, low-sugar cereal, oatmeal, baked potatoes, crackers, and bagels.  Bananas.  Applesauce.  Eat probiotic-rich foods, such as yogurt and milk products that are fermented.  Wash your hands well after each time you have watery poop.  Only take medicine as told by your doctor.  Take a warm bath to help lessen burning or pain from having  watery poop. GET HELP RIGHT AWAY IF:   You cannot drink fluids without throwing up (vomiting).  You keep throwing up.  You have blood in your poop, or your poop looks black and tarry.  You do not pee (urinate) in 6-8 hours, or there is only a small amount of very dark pee.  You have belly (abdominal) pain that gets worse or stays in the same spot (localizes).  You are weak, dizzy, confused, or lightheaded.  You have a very bad headache.  Your watery poop gets worse or does not get  better.  You have a fever or lasting symptoms for more than 2-3 days.  You have a fever and your symptoms suddenly get worse. MAKE SURE YOU:   Understand these instructions.  Will watch your condition.  Will get help right away if you are not doing well or get worse. Document Released: 02/26/2008 Document Revised: 06/03/2012 Document Reviewed: 05/17/2012 [facility] Patient Information 2015 [place], [place]. This information is not intended to replace advice given to you by your health care provider. Make sure you discuss any questions you have with your health care provider.

## 2014-03-31 NOTE — Progress Notes (Signed)
Subjective:    Patient ID: [name], female    DOB: 18-Aug-1928, 80 y.o.   MRN: [id number]  HPI Comments: 80 yo WF with abnormal CT scan in 10/2013 of abdomen. She has started having increased diarrhea with new medicine given for glossitis. She notes mouth still burns. She has seen Dr Watt Climes who has been evaluating for mild diarrhea and cramping and was started on probiotic. She has had decreased appetite.   WBC             6.5   12/16/2013 HGB            13.8   12/16/2013 HCT            41.2   12/16/2013 PLT             152   12/16/2013 GLUCOSE         150   12/16/2013 CHOL            139   12/16/2013 TRIG             94   12/16/2013 HDL              41   12/16/2013 LDLCALC          79   12/16/2013 ALT              32   12/16/2013 AST              45   12/16/2013 NA              135   12/16/2013 K               4.1   12/16/2013 CL              101   12/16/2013 CREATININE     1.01   12/16/2013 BUN              16   12/16/2013 CO2              26   12/16/2013 TSH           3.250   12/16/2013 INR            1.26   11/27/2013 HGBA1C          5.7   12/16/2013     Medication List       This list is accurate as of: 03/31/14 11:59 AM.  Always use your most recent med list.               ALPRAZolam 1 MG tablet  Commonly known as:  XANAX  Take 0.5 tablets (0.5 mg total) by mouth at bedtime as needed for sleep or anxiety.     atenolol 50 MG tablet  Commonly known as:  TENORMIN  Take 1 tablet (50 mg total) by mouth daily.     azelastine 0.1 % nasal spray  Commonly known as:  ASTELIN  Place 2 sprays into both nostrils at bedtime. Use in each nostril as directed     bumetanide 0.5 MG tablet  Commonly known as:  BUMEX  Take 1 to 2 tablets daily as directed for fluid and swelling     chlorhexidine 0.12 % solution  Commonly known as:  PERIDEX  Use as directed 15 mLs in the mouth or throat 2 (two) times daily.     cholecalciferol 1000 UNITS tablet  Commonly known as:  VITAMIN D  Take 1,000  Units by mouth daily.     cyclobenzaprine 10 MG tablet  Commonly known as:  FLEXERIL  Take 10 mg by mouth 2 (two) times daily as needed for muscle spasms.     multivitamin 3-35-2 MG Tabs tablet  Take 1 tablet by mouth daily.     NITROSTAT 0.4 MG SL tablet  Generic drug:  nitroGLYCERIN  Place 0.4 mg under the tongue every 5 (five) minutes as needed for chest pain.     omeprazole 40 MG capsule  Commonly known as:  PRILOSEC  Take 40 mg by mouth daily as needed (for reflux).     spironolactone 50 MG tablet  Commonly known as:  ALDACTONE  Take 1 or 2 tablets daily as directed for fluid and swelling     triamcinolone cream 0.1 %  Commonly known as:  KENALOG  Apply 1 application topically 2 (two) times daily as needed. Eczema          Review of Systems  HENT: Positive for mouth sores.   Gastrointestinal: Positive for diarrhea and abdominal distention.  All other systems reviewed and are negative.  BP 118/70  Pulse 64  Temp(Src) 98.8 F (37.1 C) (Temporal)  Resp 16  Ht 5' 6.25" (1.683 m)  Wt 181 lb (82.101 kg)  BMI 28.99 kg/m2     Objective:   Physical Exam  Nursing note and vitals reviewed. Constitutional: She is oriented to person, place, and time. She appears well-developed and well-nourished.  HENT:  Head: Normocephalic and atraumatic.  Tongue white with coating  Eyes: Conjunctivae are normal.  Neck: Normal range of motion.  Cardiovascular: Normal rate, regular rhythm, normal heart sounds and intact distal pulses.   Pulmonary/Chest: Effort normal and breath sounds normal.  Abdominal: Soft. Bowel sounds are normal. She exhibits no distension and no mass. There is no tenderness. There is no rebound and no guarding.  Musculoskeletal: Normal range of motion.  Neurological: She is alert and oriented to person, place, and time.  Skin: Skin is warm and dry.  Psychiatric: She has a normal mood and affect. Judgment normal.          Assessment & Plan:  1.  Diarrhea- Trial with Hyoscyamine BID. D/c metanx with increased diarrhea. w/c if SX increase or ER. Bland diet x 1 week, check labs  2. ? Yeast- Nystatin Mouthwash, hygiene explained call with results

## 2014-04-01 LAB — FOLATE RBC: RBC Folate: 478 ng/mL (ref >280)

## 2014-04-01 LAB — VITAMIN B12: Vitamin B-12: 1109 pg/mL — ABNORMAL HIGH (ref 211–911)

## 2014-04-03 ENCOUNTER — Other Ambulatory Visit: Payer: Self-pay | Admitting: Emergency Medicine

## 2014-04-03 DIAGNOSIS — R6889 Other general symptoms and signs: Secondary | ICD-10-CM

## 2014-04-12 ENCOUNTER — Telehealth: Payer: Self-pay | Admitting: *Deleted

## 2014-04-12 NOTE — Telephone Encounter (Signed)
Per [facility] staff phone call scheduled appts. Advised schedulers 

## 2014-04-13 ENCOUNTER — Other Ambulatory Visit: Payer: Self-pay | Admitting: *Deleted

## 2014-04-13 ENCOUNTER — Other Ambulatory Visit: Payer: Self-pay | Admitting: Nurse Practitioner

## 2014-04-13 ENCOUNTER — Encounter: Payer: Self-pay | Admitting: Hematology & Oncology

## 2014-04-13 ENCOUNTER — Ambulatory Visit (HOSPITAL_BASED_OUTPATIENT_CLINIC_OR_DEPARTMENT_OTHER): Payer: Medicare Other

## 2014-04-13 ENCOUNTER — Other Ambulatory Visit (HOSPITAL_BASED_OUTPATIENT_CLINIC_OR_DEPARTMENT_OTHER): Payer: Medicare Other | Admitting: Lab

## 2014-04-13 ENCOUNTER — Other Ambulatory Visit: Payer: Medicare Other

## 2014-04-13 ENCOUNTER — Ambulatory Visit (HOSPITAL_BASED_OUTPATIENT_CLINIC_OR_DEPARTMENT_OTHER): Payer: Medicare Other | Admitting: Hematology & Oncology

## 2014-04-13 VITALS — BP 160/47 | HR 59 | Temp 97.5°F | Resp 14 | Ht 65.0 in | Wt 176.0 lb

## 2014-04-13 DIAGNOSIS — D509 Iron deficiency anemia, unspecified: Secondary | ICD-10-CM

## 2014-04-13 DIAGNOSIS — K8689 Other specified diseases of pancreas: Secondary | ICD-10-CM

## 2014-04-13 DIAGNOSIS — R197 Diarrhea, unspecified: Secondary | ICD-10-CM

## 2014-04-13 DIAGNOSIS — R5381 Other malaise: Secondary | ICD-10-CM

## 2014-04-13 DIAGNOSIS — R5383 Other fatigue: Secondary | ICD-10-CM

## 2014-04-13 LAB — CBC WITH DIFFERENTIAL (CANCER CENTER ONLY)
BASO#: 0 10*3/uL (ref 0.0–0.2)
BASO%: 0.2 % (ref 0.0–2.0)
EOS ABS: 0.1 10*3/uL (ref 0.0–0.5)
EOS%: 2.4 % (ref 0.0–7.0)
HCT: 40.5 % (ref 34.8–46.6)
HGB: 13.9 g/dL (ref 11.6–15.9)
LYMPH#: 1.8 10*3/uL (ref 0.9–3.3)
LYMPH%: 30.5 % (ref 14.0–48.0)
MCH: 32.8 pg (ref 26.0–34.0)
MCHC: 34.3 g/dL (ref 32.0–36.0)
MCV: 96 fL (ref 81–101)
MONO#: 0.7 10*3/uL (ref 0.1–0.9)
MONO%: 11.6 % (ref 0.0–13.0)
NEUT%: 55.3 % (ref 39.6–80.0)
NEUTROS ABS: 3.3 10*3/uL (ref 1.5–6.5)
Platelets: 130 10*3/uL — ABNORMAL LOW (ref 145–400)
RBC: 4.24 10*6/uL (ref 3.70–5.32)
RDW: 12.6 % (ref 11.1–15.7)
WBC: 5.9 10*3/uL (ref 3.9–10.0)

## 2014-04-13 LAB — IRON AND TIBC CHCC
%SAT: 58 % — ABNORMAL HIGH (ref 21–57)
Iron: 173 ug/dL — ABNORMAL HIGH (ref 41–142)
TIBC: 295 ug/dL (ref 236–444)
UIBC: 123 ug/dL (ref 120–384)

## 2014-04-13 LAB — BASIC METABOLIC PANEL - CANCER CENTER ONLY
BUN, Bld: 11 mg/dL (ref 7–22)
CO2: 29 meq/L (ref 18–33)
Calcium: 9.1 mg/dL (ref 8.0–10.3)
Chloride: 101 mEq/L (ref 98–108)
Creat: 1.1 mg/dl (ref 0.6–1.2)
GLUCOSE: 127 mg/dL — AB (ref 73–118)
POTASSIUM: 3.7 meq/L (ref 3.3–4.7)
SODIUM: 134 meq/L (ref 128–145)

## 2014-04-13 LAB — RETICULOCYTES (CHCC)
ABS Retic: 29.9 10*3/uL (ref 19.0–186.0)
RBC.: 4.27 MIL/uL (ref 3.87–5.11)
RETIC CT PCT: 0.7 % (ref 0.4–2.3)

## 2014-04-13 LAB — FERRITIN CHCC: Ferritin: 164 ng/ml (ref 9–269)

## 2014-04-13 MED ORDER — METRONIDAZOLE 500 MG PO TABS
500.0000 mg | ORAL_TABLET | Freq: Three times a day (TID) | ORAL | Status: DC
Start: 2014-04-13 — End: 2014-04-13

## 2014-04-13 MED ORDER — SODIUM CHLORIDE 0.9 % IV SOLN
Freq: Once | INTRAVENOUS | Status: AC
  Administered 2014-04-13: 13:00:00 via INTRAVENOUS

## 2014-04-13 MED ORDER — METRONIDAZOLE 500 MG PO TABS: 500.0000 mg | ORAL_TABLET | Freq: Three times a day (TID) | ORAL | Status: DC

## 2014-04-13 MED ORDER — PANCRELIPASE (LIP-PROT-AMYL) 24000-76000 UNITS PO CPEP: 1.0000 | ORAL_CAPSULE | Freq: Three times a day (TID) | ORAL | Status: DC

## 2014-04-13 NOTE — Patient Instructions (Signed)
Fall Prevention and Home Safety °Falls cause injuries and can affect all age groups. It is possible to prevent falls.  °HOW TO PREVENT FALLS °· Wear shoes with rubber soles that do not have an opening for your toes. °· Keep the inside and outside of your house well lit. °· Use night lights throughout your home. °· Remove clutter from floors. °· Clean up floor spills. °· Remove throw rugs or fasten them to the floor with carpet tape. °· Do not place electrical cords across pathways. °· Put grab bars by your tub, shower, and toilet. Do not use towel bars as grab bars. °· Put handrails on both sides of the stairway. Fix loose handrails. °· Do not climb on stools or stepladders, if possible. °· Do not wax your floors. °· Repair uneven or unsafe sidewalks, walkways, or stairs. °· Keep items you use a lot within reach. °· Be aware of pets. °· Keep emergency numbers next to the telephone. °· Put smoke detectors in your home and near bedrooms. °Ask your doctor what other things you can do to prevent falls. °Document Released: 07/06/2009 Document Revised: 03/10/2012 Document Reviewed: 12/10/2011 °[employer]® Patient Information ©2015 [employer]. This information is not intended to replace advice given to you by your health care provider. Make sure you discuss any questions you have with your health care provider. ° °

## 2014-04-14 ENCOUNTER — Telehealth: Payer: Self-pay | Admitting: *Deleted

## 2014-04-14 ENCOUNTER — Other Ambulatory Visit: Payer: Self-pay | Admitting: *Deleted

## 2014-04-14 NOTE — Telephone Encounter (Signed)
Informed pt that Dr Marin Olp wants her to stop taking the Hycosamine.

## 2014-04-14 NOTE — Progress Notes (Signed)
Hematology and Oncology Follow Up Visit  DIAMOND JENTZ [id number] [birth year] 80 y.o. 04/14/2014   Principle Diagnosis:  1. Localized hepatocellular carcinoma. 2. History of recurrent iron-deficiency anemia. 3. Nonalcoholic steatohepatitis.  Current Therapy:    Observation     Interim History:  Ms.  Beckmann is in for an interval visit. We've not seen her for a year or more. When we last saw her, she had a localized hepatocellular carcinoma. She underwent radio frequency ablation. She did well with this.  She apparently has had problems with weakness. She's lost weight it. She has diarrhea. She has some abdominal pain.  She sees other doctors. She sees a gastroenterologist. However, she always feels that she can come to [place] and we will fix her problems.  She's had no bleeding. She's had no vomiting. She has several bowel movements a day. She was on antibiotics. I worry about clostridium.  I don't see any medication that she is taking that would be good to reintubate and diarrhea.  Again, she had no melena or bright red blood per rectum.  Her appetite is down. She just does not want to eat that much.  She's had no rashes. She's had no leg swelling. Her overall, her performance status is ECOG 3.  Medications: Current outpatient prescriptions:ALPRAZolam (XANAX) 1 MG tablet, Take 0.5 tablets (0.5 mg total) by mouth at bedtime as needed for sleep or anxiety., Disp: 90 tablet, Rfl: 3;  atenolol (TENORMIN) 50 MG tablet, Take 1 tablet (50 mg total) by mouth daily., Disp: 90 tablet, Rfl: 1;  azelastine (ASTELIN) 137 MCG/SPRAY nasal spray, Place 2 sprays into both nostrils at bedtime. Use in each nostril as directed, Disp: 30 mL, Rfl: 2 bumetanide (BUMEX) 0.5 MG tablet, Take 1 to 2 tablets daily as directed for fluid and swelling, Disp: 60 tablet, Rfl: 99;  cholecalciferol (VITAMIN D) 1000 UNITS tablet, Take 1,000 Units by mouth daily., Disp: , Rfl: ;  omeprazole (PRILOSEC) 40 MG capsule, Take 40 mg  by mouth daily as needed (for reflux). , Disp: , Rfl: ;  Probiotic Product (PROBIOTIC DAILY PO), Take by mouth every morning., Disp: , Rfl:  spironolactone (ALDACTONE) 50 MG tablet, Take 1 or 2 tablets daily as directed for fluid and swelling, Disp: 60 tablet, Rfl: 99;  SUCRALFATE PO, Take by mouth every morning., Disp: , Rfl: ;  triamcinolone cream (KENALOG) 0.1 %, Apply 1 application topically 2 (two) times daily as needed. Eczema, Disp: , Rfl: ;  cyclobenzaprine (FLEXERIL) 10 MG tablet, Take 10 mg by mouth 2 (two) times daily as needed for muscle spasms. , Disp: , Rfl:  metroNIDAZOLE (FLAGYL) 500 MG tablet, Take 1 tablet (500 mg total) by mouth 3 (three) times daily., Disp: 30 tablet, Rfl: 0;  multivitamin (METANX) 3-35-2 MG TABS tablet, Take 1 tablet by mouth daily., Disp: 30 tablet, Rfl: 0;  NITROSTAT 0.4 MG SL tablet, Place 0.4 mg under the tongue every 5 (five) minutes as needed for chest pain. , Disp: , Rfl:  nystatin (MYCOSTATIN) 100000 UNIT/ML suspension, Take 5 mLs (500,000 Units total) by mouth 4 (four) times daily., Disp: 60 mL, Rfl: 0;  Pancrelipase, Lip-Prot-Amyl, (CREON) 24000 UNITS CPEP, Take 1 capsule (24,000 Units total) by mouth 3 (three) times daily before meals., Disp: 90 capsule, Rfl: 3;  [DISCONTINUED] fluvastatin XL (LESCOL XL) 80 MG 24 hr tablet, Take 80 mg by mouth daily.  , Disp: , Rfl:   Allergies:  Allergies  Allergen Reactions  . Avelox [Moxifloxacin Hcl In Nacl]  Anaphylaxis  . Sulfa Antibiotics Hives  . Ace Inhibitors   . Doxycycline   . Fenofibrate     constipation  . Infed [Iron Dextran]     IV IRON  . Infergen [Interferon Alfacon-1]   . Statins     Liver function elevations  . Vancomycin Other (See Comments)    Flushing, erythema to neck and face, tolerated vanc at lower infusion rate  . Ciprofloxacin Other (See Comments)    Complains of sore mouth    Past Medical History, Surgical history, Social history, and Family History were reviewed and  updated.  Review of Systems: As above  Physical Exam:  height is 5\' 5"  (1.651 m) and weight is 176 lb (79.833 kg). Her oral temperature is 97.5 F (36.4 C). Her blood pressure is 160/47 and her pulse is 59. Her respiration is 14.   Elderly white female. She is no ocular or oral lesions. She has no adenopathy in the neck. There is no scleral icterus. There is no palpable thyroid. Lungs are clear. Cardiac exam regular in rhythm with no murmurs rubs or bruits. Abdomen is soft. She has good bowel sounds. There is no fluid wave. There is no palpable liver or spleen tip. Back exam showed no tenderness over the spine ribs or hips. Extremities shows no clubbing cyanosis or edema. His age-related changes in her joints. She decent strength in her arms and legs. Skin exam no rashes. Neurological exam is nonfocal.  Lab Results  Component Value Date   WBC 5.9 04/13/2014   HGB 13.9 04/13/2014   HCT 40.5 04/13/2014   MCV 96 04/13/2014   PLT 130* 04/13/2014     Chemistry      Component Value Date/Time   NA 134 04/13/2014 1019   NA 134* 03/31/2014 1211   K 3.7 04/13/2014 1019   K 4.4 03/31/2014 1211   CL 101 04/13/2014 1019   CL 103 03/31/2014 1211   CO2 29 04/13/2014 1019   CO2 25 03/31/2014 1211   BUN 11 04/13/2014 1019   BUN 15 03/31/2014 1211   CREATININE 1.1 04/13/2014 1019   CREATININE 0.91 11/29/2013 0550      Component Value Date/Time   CALCIUM 9.1 04/13/2014 1019   CALCIUM 10.0 03/31/2014 1211   ALKPHOS 128* 03/31/2014 1211   AST 56* 03/31/2014 1211   ALT 38* 03/31/2014 1211   BILITOT 1.0 03/31/2014 1211       Ferritin is 164. Iron saturation is 58%. Her glucoses 127.  Impression and Plan: Ms. Grauberger is a 80 year old white female. She has a history of recurrent iron deficiency anemia. She definitely is not iron deficient. She does appear be dehydrated.  I told her to try a probiotic. Hopefully this might help.  I will give her a prescription for Flagyl. I don't see how this will hurt her. I also gave her a  prescription for pancreatic enzymes. I suppose she could be pancreatic insufficient to some degree.  We will give her IV fluids in the office.  We probably need to get her back in a couple weeks to see how she is doing. She has a lot of confidence in our staff and feels like we can help her most of all. I told her that I have absolutely complete confidence in her other doctors in the day and are doing great job and try to manage her symptoms.  We spent about 45 is with her today.     Volanda Napoleon, MD 7/23/20155:47 PM

## 2014-04-15 ENCOUNTER — Telehealth: Payer: Self-pay | Admitting: *Deleted

## 2014-04-15 ENCOUNTER — Other Ambulatory Visit: Payer: Self-pay | Admitting: Gastroenterology

## 2014-04-15 DIAGNOSIS — R1084 Generalized abdominal pain: Secondary | ICD-10-CM

## 2014-04-15 NOTE — Telephone Encounter (Signed)
Called and informed daughter that patient's labs are good per Dr Marin Olp.

## 2014-04-17 ENCOUNTER — Inpatient Hospital Stay (HOSPITAL_COMMUNITY)
Admission: EM | Admit: 2014-04-17 | Discharge: 2014-04-20 | DRG: 603 | Disposition: A | Discharge status: Skilled Nursing Facility | Payer: Medicare Other | Attending: Internal Medicine | Admitting: Internal Medicine

## 2014-04-17 ENCOUNTER — Encounter (HOSPITAL_COMMUNITY): Payer: Self-pay | Admitting: Emergency Medicine

## 2014-04-17 ENCOUNTER — Emergency Department (HOSPITAL_COMMUNITY): Payer: Medicare Other

## 2014-04-17 DIAGNOSIS — H3530 Unspecified macular degeneration: Secondary | ICD-10-CM | POA: Diagnosis present

## 2014-04-17 DIAGNOSIS — E8809 Other disorders of plasma-protein metabolism, not elsewhere classified: Secondary | ICD-10-CM | POA: Diagnosis present

## 2014-04-17 DIAGNOSIS — L03116 Cellulitis of left lower limb: Secondary | ICD-10-CM | POA: Diagnosis present

## 2014-04-17 DIAGNOSIS — K219 Gastro-esophageal reflux disease without esophagitis: Secondary | ICD-10-CM | POA: Diagnosis present

## 2014-04-17 DIAGNOSIS — E119 Type 2 diabetes mellitus without complications: Secondary | ICD-10-CM | POA: Diagnosis present

## 2014-04-17 DIAGNOSIS — G2581 Restless legs syndrome: Secondary | ICD-10-CM

## 2014-04-17 DIAGNOSIS — K253 Acute gastric ulcer without hemorrhage or perforation: Secondary | ICD-10-CM

## 2014-04-17 DIAGNOSIS — M1990 Unspecified osteoarthritis, unspecified site: Secondary | ICD-10-CM | POA: Diagnosis present

## 2014-04-17 DIAGNOSIS — E559 Vitamin D deficiency, unspecified: Secondary | ICD-10-CM | POA: Diagnosis present

## 2014-04-17 DIAGNOSIS — J90 Pleural effusion, not elsewhere classified: Secondary | ICD-10-CM | POA: Diagnosis present

## 2014-04-17 DIAGNOSIS — Z8673 Personal history of transient ischemic attack (TIA), and cerebral infarction without residual deficits: Secondary | ICD-10-CM

## 2014-04-17 DIAGNOSIS — L03119 Cellulitis of unspecified part of limb: Principal | ICD-10-CM

## 2014-04-17 DIAGNOSIS — E871 Hypo-osmolality and hyponatremia: Secondary | ICD-10-CM | POA: Diagnosis present

## 2014-04-17 DIAGNOSIS — C228 Malignant neoplasm of liver, primary, unspecified as to type: Secondary | ICD-10-CM

## 2014-04-17 DIAGNOSIS — Z87891 Personal history of nicotine dependence: Secondary | ICD-10-CM | POA: Diagnosis not present

## 2014-04-17 DIAGNOSIS — M7989 Other specified soft tissue disorders: Secondary | ICD-10-CM

## 2014-04-17 DIAGNOSIS — C220 Liver cell carcinoma: Secondary | ICD-10-CM

## 2014-04-17 DIAGNOSIS — K7689 Other specified diseases of liver: Secondary | ICD-10-CM | POA: Diagnosis present

## 2014-04-17 DIAGNOSIS — K5520 Angiodysplasia of colon without hemorrhage: Secondary | ICD-10-CM

## 2014-04-17 DIAGNOSIS — D509 Iron deficiency anemia, unspecified: Secondary | ICD-10-CM

## 2014-04-17 DIAGNOSIS — K7460 Unspecified cirrhosis of liver: Secondary | ICD-10-CM | POA: Diagnosis present

## 2014-04-17 DIAGNOSIS — M79609 Pain in unspecified limb: Secondary | ICD-10-CM

## 2014-04-17 DIAGNOSIS — L02419 Cutaneous abscess of limb, unspecified: Principal | ICD-10-CM | POA: Diagnosis present

## 2014-04-17 DIAGNOSIS — R7309 Other abnormal glucose: Secondary | ICD-10-CM

## 2014-04-17 DIAGNOSIS — I252 Old myocardial infarction: Secondary | ICD-10-CM

## 2014-04-17 DIAGNOSIS — D696 Thrombocytopenia, unspecified: Secondary | ICD-10-CM | POA: Diagnosis present

## 2014-04-17 DIAGNOSIS — K766 Portal hypertension: Secondary | ICD-10-CM | POA: Diagnosis present

## 2014-04-17 DIAGNOSIS — I10 Essential (primary) hypertension: Secondary | ICD-10-CM | POA: Diagnosis present

## 2014-04-17 DIAGNOSIS — I809 Phlebitis and thrombophlebitis of unspecified site: Secondary | ICD-10-CM

## 2014-04-17 DIAGNOSIS — L03115 Cellulitis of right lower limb: Secondary | ICD-10-CM

## 2014-04-17 DIAGNOSIS — Z79899 Other long term (current) drug therapy: Secondary | ICD-10-CM

## 2014-04-17 DIAGNOSIS — R197 Diarrhea, unspecified: Secondary | ICD-10-CM | POA: Diagnosis present

## 2014-04-17 DIAGNOSIS — K7469 Other cirrhosis of liver: Secondary | ICD-10-CM

## 2014-04-17 LAB — CBC WITH DIFFERENTIAL/PLATELET
BASOS ABS: 0 10*3/uL (ref 0.0–0.1)
Basophils Relative: 0 % (ref 0–1)
Eosinophils Absolute: 0.1 10*3/uL (ref 0.0–0.7)
Eosinophils Relative: 2 % (ref 0–5)
HCT: 38.9 % (ref 36.0–46.0)
Hemoglobin: 13.1 g/dL (ref 12.0–15.0)
LYMPHS PCT: 15 % (ref 12–46)
Lymphs Abs: 1.1 10*3/uL (ref 0.7–4.0)
MCH: 31.7 pg (ref 26.0–34.0)
MCHC: 33.7 g/dL (ref 30.0–36.0)
MCV: 94.2 fL (ref 78.0–100.0)
Monocytes Absolute: 1.2 10*3/uL — ABNORMAL HIGH (ref 0.1–1.0)
Monocytes Relative: 16 % — ABNORMAL HIGH (ref 3–12)
NEUTROS ABS: 4.8 10*3/uL (ref 1.7–7.7)
Neutrophils Relative %: 67 % (ref 43–77)
PLATELETS: 131 10*3/uL — AB (ref 150–400)
RBC: 4.13 MIL/uL (ref 3.87–5.11)
RDW: 12.9 % (ref 11.5–15.5)
WBC: 7.2 10*3/uL (ref 4.0–10.5)

## 2014-04-17 LAB — D-DIMER, QUANTITATIVE (NOT AT ARMC): D-Dimer, Quant: 0.44 ug/mL-FEU (ref 0.00–0.48)

## 2014-04-17 LAB — COMPREHENSIVE METABOLIC PANEL
ALT: 31 U/L (ref 0–35)
AST: 42 U/L — AB (ref 0–37)
Albumin: 3.2 g/dL — ABNORMAL LOW (ref 3.5–5.2)
Alkaline Phosphatase: 114 U/L (ref 39–117)
Anion gap: 14 (ref 5–15)
BUN: 12 mg/dL (ref 6–23)
CO2: 23 mEq/L (ref 19–32)
Calcium: 9.6 mg/dL (ref 8.4–10.5)
Chloride: 99 mEq/L (ref 96–112)
Creatinine, Ser: 0.87 mg/dL (ref 0.50–1.10)
GFR calc non Af Amer: 60 mL/min — ABNORMAL LOW (ref >90)
GFR, EST AFRICAN AMERICAN: 69 mL/min — AB (ref >90)
GLUCOSE: 129 mg/dL — AB (ref 70–99)
Potassium: 4.2 mEq/L (ref 3.7–5.3)
SODIUM: 136 meq/L — AB (ref 137–147)
TOTAL PROTEIN: 6.6 g/dL (ref 6.0–8.3)
Total Bilirubin: 1.2 mg/dL (ref 0.3–1.2)

## 2014-04-17 LAB — TROPONIN I: Troponin I: <0.3 ng/mL (ref <0.30)

## 2014-04-17 LAB — TSH: TSH: 1.22 u[IU]/mL (ref 0.350–4.500)

## 2014-04-17 MED ORDER — ACETAMINOPHEN 325 MG PO TABS: 650.0000 mg | ORAL_TABLET | Freq: Four times a day (QID) | ORAL | Status: DC | PRN

## 2014-04-17 MED ORDER — HEPARIN SODIUM (PORCINE) 5000 UNIT/ML IJ SOLN
5000.0000 [IU] | Freq: Three times a day (TID) | INTRAMUSCULAR | Status: DC
  Administered 2014-04-17 – 2014-04-20 (×4): 5000 [IU] via SUBCUTANEOUS
  Filled 2014-04-17 (×10): qty 1

## 2014-04-17 MED ORDER — ATENOLOL 50 MG PO TABS
50.0000 mg | ORAL_TABLET | Freq: Every day | ORAL | Status: DC
  Administered 2014-04-18 – 2014-04-20 (×3): 50 mg via ORAL
  Filled 2014-04-17 (×3): qty 1

## 2014-04-17 MED ORDER — NITROGLYCERIN 0.4 MG SL SUBL: 0.4000 mg | SUBLINGUAL_TABLET | SUBLINGUAL | Status: DC | PRN

## 2014-04-17 MED ORDER — ACETAMINOPHEN 650 MG RE SUPP: 650.0000 mg | Freq: Four times a day (QID) | RECTAL | Status: DC | PRN

## 2014-04-17 MED ORDER — PANCRELIPASE (LIP-PROT-AMYL) 12000-38000 UNITS PO CPEP
2.0000 | ORAL_CAPSULE | Freq: Three times a day (TID) | ORAL | Status: DC
  Administered 2014-04-17 – 2014-04-20 (×9): 2 via ORAL
  Filled 2014-04-17 (×11): qty 2

## 2014-04-17 MED ORDER — CEFTRIAXONE SODIUM 1 G IJ SOLR
1.0000 g | Freq: Once | INTRAMUSCULAR | Status: AC
  Administered 2014-04-17: 1 g via INTRAVENOUS
  Filled 2014-04-17: qty 10

## 2014-04-17 MED ORDER — HYDROCODONE-ACETAMINOPHEN 5-325 MG PO TABS
1.0000 | ORAL_TABLET | ORAL | Status: DC | PRN
  Administered 2014-04-17 – 2014-04-19 (×2): 2 via ORAL
  Filled 2014-04-17 (×2): qty 2

## 2014-04-17 MED ORDER — ALPRAZOLAM 0.5 MG PO TABS
0.5000 mg | ORAL_TABLET | Freq: Every evening | ORAL | Status: DC | PRN
  Administered 2014-04-17 – 2014-04-18 (×2): 0.5 mg via ORAL
  Filled 2014-04-17 (×2): qty 1

## 2014-04-17 MED ORDER — ONDANSETRON HCL 4 MG/2ML IJ SOLN: 4.0000 mg | Freq: Four times a day (QID) | INTRAMUSCULAR | Status: DC | PRN

## 2014-04-17 MED ORDER — ONDANSETRON HCL 4 MG PO TABS: 4.0000 mg | ORAL_TABLET | Freq: Four times a day (QID) | ORAL | Status: DC | PRN

## 2014-04-17 MED ORDER — FUROSEMIDE 10 MG/ML IJ SOLN
40.0000 mg | Freq: Two times a day (BID) | INTRAMUSCULAR | Status: DC
  Administered 2014-04-17 – 2014-04-19 (×4): 40 mg via INTRAVENOUS
  Filled 2014-04-17 (×6): qty 4

## 2014-04-17 MED ORDER — SPIRONOLACTONE 50 MG PO TABS
50.0000 mg | ORAL_TABLET | Freq: Every day | ORAL | Status: DC
  Administered 2014-04-18 – 2014-04-20 (×3): 50 mg via ORAL
  Filled 2014-04-17 (×4): qty 1

## 2014-04-17 MED ORDER — VITAMIN D3 25 MCG (1000 UNIT) PO TABS
1000.0000 [IU] | ORAL_TABLET | Freq: Every day | ORAL | Status: DC
  Administered 2014-04-17 – 2014-04-20 (×4): 1000 [IU] via ORAL
  Filled 2014-04-17 (×5): qty 1

## 2014-04-17 MED ORDER — PANTOPRAZOLE SODIUM 40 MG PO TBEC
40.0000 mg | DELAYED_RELEASE_TABLET | Freq: Every day | ORAL | Status: DC
  Administered 2014-04-18 – 2014-04-20 (×3): 40 mg via ORAL
  Filled 2014-04-17 (×3): qty 1

## 2014-04-17 MED ORDER — VANCOMYCIN HCL 10 G IV SOLR
1500.0000 mg | INTRAVENOUS | Status: DC
  Administered 2014-04-17 – 2014-04-19 (×3): 1500 mg via INTRAVENOUS
  Filled 2014-04-17 (×4): qty 1500

## 2014-04-17 MED ORDER — SODIUM CHLORIDE 0.9 % IJ SOLN
3.0000 mL | Freq: Two times a day (BID) | INTRAMUSCULAR | Status: DC
  Administered 2014-04-18 – 2014-04-19 (×2): 3 mL via INTRAVENOUS

## 2014-04-17 NOTE — Progress Notes (Signed)
[name] [id number] Admission Data: 04/17/2014 4:06 PM Attending Provider: Verlee Monte, MD  QBH:ALPFXTK,WIOXBDZ DAVID, MD Consults/ Treatment Team:    [name] is a 80 y.o. female patient admitted from ED awake, alert  & orientated  X 3,  Full Code, VSS - Blood pressure 140/56, pulse 72, temperature 98.7 F (37.1 C), temperature source Oral, resp. rate 20, height 5\' 6"  (1.676 m), weight 79.84 kg (176 lb 0.2 oz), SpO2 97.00%.,  no c/o shortness of breath, no c/o chest pain, no distress noted. Tele # 09 placed   IV site WDL:  Left arm, NSL  Allergies:   Allergies  Allergen Reactions  . Avelox [Moxifloxacin Hcl In Nacl] Anaphylaxis  . Statins Other (See Comments)    Liver function elevations  . Sulfa Antibiotics Hives and Itching  . Ace Inhibitors     Unknown  . Doxycycline     Unknown  . Infed [Iron Dextran] Other (See Comments)    IV IRON, Unknown  . Infergen [Interferon Alfacon-1]   . Vancomycin Other (See Comments)    Flushing, erythema to neck and face, tolerated vanc at lower infusion rate  . Ciprofloxacin Other (See Comments)    Complains of sore mouth  . Fenofibrate Other (See Comments)    constipation     Past Medical History  Diagnosis Date  . Anemia, iron deficiency 08/07/2011  . Angiodysplasia of intestinal tract 08/07/2011  . Hypertension   . Arthritis   . Diverticulitis   . Phlebitis   . Macular degeneration   . TIA (transient ischemic attack)   . MI (myocardial infarction)   . Hiatal hernia   . AVM (arteriovenous malformation) of colon with hemorrhage   . Varicose veins of legs   . Cataract   . GERD (gastroesophageal reflux disease)   . DJD (degenerative joint disease)   . Diabetes mellitus     borderline  . History of blood transfusion   . PONV (postoperative nausea and vomiting)   . hepatocellar ca dx'd 08/2012  . Edema leg   . Fatty liver   . Vitamin D deficiency      Pt orientation to unit, room and routine. Information packet given  to patient/family. Admission INP armband ID verified with patient/family, and in place. SR up x 2, fall risk assessment complete with Patient and family verbalizing understanding of risks associated with falls. Pt verbalizes an understanding of how to use the call bell and to call for help before getting out of bed.  Skin, clean-dry- intact without evidence of bruising, or skin tears.   No evidence of skin break down noted on exam.     Will cont to monitor and assist as needed.  Dayle Points, RN 04/17/2014 4:06 PM

## 2014-04-17 NOTE — ED Notes (Signed)
Pt from home via EMS with c/o swelling and redness to left leg onset last night. Pt reports that she went to see her Dr Marlana Latus and was told she needed fluids. Pt had fluids given to her Wednesday. Pt reports that she sat in a recliner at the Dr office for 3 hours and when she got up she had a lump on the back of her left leg. Pt c/o pain with ambulation. Pt presents with swelling and redness to Left lower leg, slightly warm to touch.

## 2014-04-17 NOTE — H&P (Signed)
[facility] History and Physical  [name] SJG:[id number] DOB: 1928/03/31 DOA: 04/17/2014  Referring physician: Orlie Dakin, MD  PCP: Alesia Richards, MD   Chief Complaint: Left lower extremity redness and swelling  HPI: [name] is a 80 y.o. female with past medical history of HTN, cirrhosis and hepatoma status post ablation, came in to the hospital because of left lower extremity swelling, redness, pain and tenderness. Patient also has group of a vague symptoms including generalized weakness, loss of appetite and feeling of unwellness generally. In the ED patient was found to have left lower extremity cellulitis, CBC/BMP without major abnormalities, Doppler ultrasound was negative for DVT, chest x-ray showed right-sided pleural effusion. Started initially on ceftriaxone, I will switch to vancomycin.  Review of Systems:  Constitutional: negative for anorexia, fevers and sweats Eyes: negative for irritation, redness and visual disturbance Ears, nose, mouth, throat, and face: negative for earaches, epistaxis, nasal congestion and sore throat Respiratory: negative for cough, dyspnea on exertion, sputum and wheezing Cardiovascular: negative for chest pain, dyspnea, lower extremity edema, orthopnea, palpitations and syncope Gastrointestinal: negative for abdominal pain, constipation, diarrhea, melena, nausea and vomiting Genitourinary:negative for dysuria, frequency and hematuria Hematologic/lymphatic: negative for bleeding, easy bruising and lymphadenopathy Musculoskeletal:negative for arthralgias, muscle weakness and stiff joints Neurological: negative for coordination problems, gait problems, headaches and weakness Endocrine: negative for diabetic symptoms including polydipsia, polyuria and weight loss Allergic/Immunologic: negative for anaphylaxis, hay fever and urticaria  Past Medical History  Diagnosis Date  . Anemia, iron deficiency 08/07/2011  .  Angiodysplasia of intestinal tract 08/07/2011  . Hypertension   . Arthritis   . Diverticulitis   . Phlebitis   . Macular degeneration   . TIA (transient ischemic attack)   . MI (myocardial infarction)   . Hiatal hernia   . AVM (arteriovenous malformation) of colon with hemorrhage   . Varicose veins of legs   . Cataract   . GERD (gastroesophageal reflux disease)   . DJD (degenerative joint disease)   . Diabetes mellitus     borderline  . History of blood transfusion   . PONV (postoperative nausea and vomiting)   . hepatocellar ca dx'd 08/2012  . Edema leg   . Fatty liver   . Vitamin D deficiency    Past Surgical History  Procedure Laterality Date  . 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 Abdominal hysterectomy    . Hemorrhoid surgery    . Esophagogastroduodenoscopy  08/30/2012    Procedure: ESOPHAGOGASTRODUODENOSCOPY (EGD);  Surgeon: Jeryl Columbia, MD;  Location: [facility];  Service: Endoscopy;  Laterality: N/A;  . Radiofrequency ablation liver tumor Right 01/2013    HFA ablation right liver lesion  . Colonoscopy  06/2009    Dr. Watt Climes, due 5 years  . Endoscopic vein laser treatment Left 2014    Dr. Kellie Simmering   Social History:   reports that she quit smoking about 23 years ago. Her smoking use included Cigarettes. She started smoking about 63 years ago. She has a 200 pack-year smoking history. She has never used smokeless tobacco. She reports that she does not drink alcohol or use illicit drugs.  Allergies  Allergen Reactions  . Avelox [Moxifloxacin Hcl In Nacl] Anaphylaxis  . Statins Other (See Comments)    Liver function elevations  . Sulfa Antibiotics Hives and Itching  . Ace Inhibitors     Unknown  . Doxycycline     Unknown  . Ardyth Harps [Iron Dextran] Other (See Comments)  IV IRON, Unknown  . Infergen [Interferon Alfacon-1]   . Vancomycin Other (See Comments)    Flushing, erythema to neck and face, tolerated vanc at lower infusion rate  .  Ciprofloxacin Other (See Comments)    Complains of sore mouth  . Fenofibrate Other (See Comments)    constipation    Family History  Problem Relation Age of Onset  . Colon cancer Sister   . Coronary artery disease Father   . Heart disease Father      Prior to Admission medications   Medication Sig Start Date End Date Taking? Authorizing Provider  ALPRAZolam Duanne Moron) 1 MG tablet Take 0.5 tablets (0.5 mg total) by mouth at bedtime as needed for sleep or anxiety. 10/26/13  Yes Vicie Mutters, PA-C  atenolol (TENORMIN) 50 MG tablet Take 1 tablet (50 mg total) by mouth daily. 01/24/14  Yes Unk Pinto, MD  cholecalciferol (VITAMIN D) 1000 UNITS tablet Take 1,000 Units by mouth daily.   Yes Historical Provider, MD  cyclobenzaprine (FLEXERIL) 10 MG tablet Take 10 mg by mouth 2 (two) times daily as needed for muscle spasms.    Yes Marybelle Killings, MD  hyoscyamine (LEVBID) 0.375 MG 12 hr tablet Take 0.375 mg by mouth 2 (two) times daily.   Yes Historical Provider, MD  metroNIDAZOLE (FLAGYL) 500 MG tablet Take 500 mg by mouth 3 (three) times daily. For 10 days. 04/14/14  Yes Historical Provider, MD  NITROSTAT 0.4 MG SL tablet Place 0.4 mg under the tongue every 5 (five) minutes as needed for chest pain.  07/05/11  Yes Historical Provider, MD  omeprazole (PRILOSEC) 40 MG capsule Take 40 mg by mouth daily as needed (for reflux).    Yes Historical Provider, MD  Pancrelipase, Lip-Prot-Amyl, (CREON) 24000 UNITS CPEP Take 1 capsule (24,000 Units total) by mouth 3 (three) times daily before meals. 04/13/14  Yes Volanda Napoleon, MD  Probiotic Product (PROBIOTIC DAILY PO) Take 1 tablet by mouth every morning.    Yes Historical Provider, MD  spironolactone (ALDACTONE) 50 MG tablet Take 50 mg by mouth every morning.   Yes Historical Provider, MD  sucralfate (CARAFATE) 1 G tablet Take 1 g by mouth 2 (two) times daily.   Yes Historical Provider, MD  triamcinolone cream (KENALOG) 0.1 % Apply 1 application topically 2  (two) times daily as needed. Eczema 07/09/12  Yes Historical Provider, MD   Physical Exam: Filed Vitals:   04/17/14 1330  BP: 124/52  Pulse: 64  Temp:   Resp: 30   Constitutional: Oriented to person, place, and time. Well-developed and well-nourished. Cooperative.  Head: Normocephalic and atraumatic.  Nose: Nose normal.  Mouth/Throat: Uvula is midline, oropharynx is clear and moist and mucous membranes are normal.  Eyes: Conjunctivae and EOM are normal. Pupils are equal, round, and reactive to light.  Neck: Trachea normal and normal range of motion. Neck supple.  Cardiovascular: Normal rate, regular rhythm, S1 normal, S2 normal, normal heart sounds and intact distal pulses.   Pulmonary/Chest: Effort normal and breath sounds normal.  Abdominal: Soft. Bowel sounds are normal. There is no hepatosplenomegaly. There is no tenderness.  Musculoskeletal: Normal range of motion.  Neurological: Alert and oriented to person, place, and time. Has normal strength. No cranial nerve deficit or sensory deficit.  Extremities: Left lower extremity redness, warmth and tenderness  Psychiatric: Has a normal mood and affect. Speech is normal and behavior is normal.         Labs on Admission:  Basic Metabolic Panel:  Recent Labs Lab 04/13/14 1019 04/17/14 1121  NA 134 136*  K 3.7 4.2  CL 101 99  CO2 29 23  GLUCOSE 127* 129*  BUN 11 12  CREATININE 1.1 0.87  CALCIUM 9.1 9.6   Liver Function Tests:  Recent Labs Lab 04/17/14 1121  AST 42*  ALT 31  ALKPHOS 114  BILITOT 1.2  PROT 6.6  ALBUMIN 3.2*   No results found for this basename: LIPASE, AMYLASE,  in the last 168 hours No results found for this basename: AMMONIA,  in the last 168 hours CBC:  Recent Labs Lab 04/13/14 1019 04/17/14 1121  WBC 5.9 7.2  NEUTROABS 3.3 4.8  HGB 13.9 13.1  HCT 40.5 38.9  MCV 96 94.2  PLT 130* 131*   Cardiac Enzymes:  Recent Labs Lab 04/17/14 1121  TROPONINI <0.30    BNP (last 3  results) No results found for this basename: PROBNP,  in the last 8760 hours CBG: No results found for this basename: GLUCAP,  in the last 168 hours  Radiological Exams on Admission: Dg Chest 2 View  04/17/2014   CLINICAL DATA:  Edema and redness in the legs.  Hacking cough.  EXAM: CHEST  2 VIEW  COMPARISON:  Chest x-ray 11/04/2013.  FINDINGS: Moderate right pleural effusion increased compared to the prior study. Associated atelectasis in the base of the right lung. Left lung appears clear. No left pleural effusion. No evidence of pulmonary edema. Heart size is normal. Large hiatal hernia again noted. Upper mediastinal contours are within normal limits. Atherosclerosis in the thoracic aorta.  IMPRESSION: 1. Interval accumulation of moderate right pleural effusion. 2. Large hiatal hernia. 3. Atherosclerosis.   Electronically Signed   By: Vinnie Langton M.D.   On: 04/17/2014 11:40    EKG: Independently reviewed.   Assessment/Plan Principal Problem:   Cellulitis of left lower extremity Active Problems:   Hypertension   Cirrhosis   Portal hypertension   Pleural effusion on right   Hepatoma    Left lower extremity cellulitis -Patient presented with erythema, redness, swelling and tenderness in the LLE consistent with cellulitis. -Started initially on ceftriaxone, switched to vancomycin. -Will follow clinically.  Right-sided pleural effusion -Patient has chronic right-sided pleural effusion secondary to cirrhosis. -Restart diuretics, if not improving might need thoracentesis.  Diarrhea -Patient mentioned she does have diarrhea for some time. -When she saw Dr. Marin Olp 3 days ago she was started on Flagyl, probiotic and pancreatic enzymes. -I will check stool studies if negative give Imodium.  History of hepatoma -Status post ablation, follows with oncology as outpatient.  Hepatic cirrhosis -With mild hyponatremia, thrombocytopenia, hypoalbuminemia and transaminasemia. -No  evidence of hepatic encephalopathy.  Code Status: Full code Family Communication: Plan discussed with the patient in the presence of her daughter [name] at bedside. Disposition Plan: Med surge.  Time spent: 70 minutes  [facility] Pager [phone]

## 2014-04-17 NOTE — Progress Notes (Signed)
ANTIBIOTIC CONSULT NOTE - INITIAL  Pharmacy Consult for Vancomycin Indication: cellulitis  Allergies  Allergen Reactions  . Avelox [Moxifloxacin Hcl In Nacl] Anaphylaxis  . Statins Other (See Comments)    Liver function elevations  . Sulfa Antibiotics Hives and Itching  . Ace Inhibitors     Unknown  . Doxycycline     Unknown  . Infed [Iron Dextran] Other (See Comments)    IV IRON, Unknown  . Infergen [Interferon Alfacon-1]   . Vancomycin Other (See Comments)    Flushing, erythema to neck and face, tolerated vanc at lower infusion rate  . Ciprofloxacin Other (See Comments)    Complains of sore mouth  . Fenofibrate Other (See Comments)    constipation    Patient Measurements: Height: 5\' 6"  (167.6 cm) Weight: 176 lb 0.2 oz (79.84 kg) IBW/kg (Calculated) : 59.3  Labs:  Recent Labs  04/17/14 1121  WBC 7.2  HGB 13.1  PLT 131*  CREATININE 0.87    Microbiology: No results found for this or any previous visit (from the past 720 hour(s)).  Medical History: Past Medical History  Diagnosis Date  . Anemia, iron deficiency 08/07/2011  . Angiodysplasia of intestinal tract 08/07/2011  . Hypertension   . Arthritis   . Diverticulitis   . Phlebitis   . Macular degeneration   . TIA (transient ischemic attack)   . MI (myocardial infarction)   . Hiatal hernia   . AVM (arteriovenous malformation) of colon with hemorrhage   . Varicose veins of legs   . Cataract   . GERD (gastroesophageal reflux disease)   . DJD (degenerative joint disease)   . Diabetes mellitus     borderline  . History of blood transfusion   . PONV (postoperative nausea and vomiting)   . hepatocellar ca dx'd 08/2012  . Edema leg   . Fatty liver   . Vitamin D deficiency     Assessment: 80 year old beginning vancomycin for cellulitis.  Goal of Therapy:  Vancomycin trough level 10-15 mcg/ml  Plan:  1) Vancomycin 1500 mg iv Q 24 hours 2) Follow up Scr, cultures, fever curve, progress  Thank  you. Anette Guarneri, PharmD [phone]  Tad Moore 04/17/2014,2:43 PM

## 2014-04-17 NOTE — ED Notes (Signed)
Patient transported to X-ray 

## 2014-04-17 NOTE — Progress Notes (Signed)
VASCULAR LAB PRELIMINARY  PRELIMINARY  PRELIMINARY  PRELIMINARY  Left lower extremity venous Doppler completed.    Preliminary report:  There is no DVT or SVT noted in the left lower extremity.  There are some varicosities noted throughout the calf.   Skila Rollins, RVT 04/17/2014, 12:47 PM

## 2014-04-17 NOTE — ED Provider Notes (Signed)
CSN: [id number]     Arrival date & time 04/17/14  1010 History   First MD Initiated Contact with Patient 04/17/14 1041     Chief Complaint  Patient presents with  . Leg Swelling     (Consider location/radiation/quality/duration/timing/severity/associated sxs/prior Treatment) HPI Left leg swelling onset 3 days ago accompanied by redness. Feels like cellulitis she's had in the past. She also complains of generalized weakness for the past several weeks multiple episodes of diarrhea. Diminished appetite and mild shortness of breath. Treated with intravenous fluids at Dr.Ennover's office a few days ago. No other associated symptoms. No fever. Past Medical History  Diagnosis Date  . Anemia, iron deficiency 08/07/2011  . Angiodysplasia of intestinal tract 08/07/2011  . Hypertension   . Arthritis   . Diverticulitis   . Phlebitis   . Macular degeneration   . TIA (transient ischemic attack)   . MI (myocardial infarction)   . Hiatal hernia   . AVM (arteriovenous malformation) of colon with hemorrhage   . Varicose veins of legs   . Cataract   . GERD (gastroesophageal reflux disease)   . DJD (degenerative joint disease)   . Diabetes mellitus     borderline  . History of blood transfusion   . PONV (postoperative nausea and vomiting)   . hepatocellar ca dx'd 08/2012  . Edema leg   . Fatty liver   . Vitamin D deficiency    Past Surgical History  Procedure Laterality Date  . 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 Abdominal hysterectomy    . Hemorrhoid surgery    . Esophagogastroduodenoscopy  08/30/2012    Procedure: ESOPHAGOGASTRODUODENOSCOPY (EGD);  Surgeon: Jeryl Columbia, MD;  Location: [facility];  Service: Endoscopy;  Laterality: N/A;  . Radiofrequency ablation liver tumor Right 01/2013    HFA ablation right liver lesion  . Colonoscopy  06/2009    Dr. Watt Climes, due 5 years  . Endoscopic vein laser treatment Left 2014    Dr. Kellie Simmering   Family History  Problem Relation  Age of Onset  . Colon cancer Sister   . Coronary artery disease Father   . Heart disease Father    History  Substance Use Topics  . Smoking status: Former Smoker -- 5.00 packs/day for 40 years    Types: Cigarettes    Start date: 04/14/1951    Quit date: 07/23/1990  . Smokeless tobacco: Never Used     Comment: quit smoking 23 years ago  . Alcohol Use: No   OB History   Grav Para Term Preterm Abortions TAB SAB Ect Mult Living                 Review of Systems  Respiratory: Positive for shortness of breath.        Chronic  Gastrointestinal: Positive for nausea. Negative for vomiting.  Skin: Positive for rash.       Redness of left lower extremity  All other systems reviewed and are negative.     Allergies  Avelox; Statins; Sulfa antibiotics; Ace inhibitors; Doxycycline; Infed; Infergen; Vancomycin; Ciprofloxacin; and Fenofibrate  Home Medications   Prior to Admission medications   Medication Sig Start Date End Date Taking? Authorizing Provider  ALPRAZolam Duanne Moron) 1 MG tablet Take 0.5 tablets (0.5 mg total) by mouth at bedtime as needed for sleep or anxiety. 10/26/13  Yes Vicie Mutters, PA-C  atenolol (TENORMIN) 50 MG tablet Take 1 tablet (50 mg total) by mouth daily. 01/24/14  Yes Unk Pinto,  MD  cholecalciferol (VITAMIN D) 1000 UNITS tablet Take 1,000 Units by mouth daily.   Yes Historical Provider, MD  cyclobenzaprine (FLEXERIL) 10 MG tablet Take 10 mg by mouth 2 (two) times daily as needed for muscle spasms.    Yes Marybelle Killings, MD  hyoscyamine (LEVBID) 0.375 MG 12 hr tablet Take 0.375 mg by mouth 2 (two) times daily.   Yes Historical Provider, MD  metroNIDAZOLE (FLAGYL) 500 MG tablet Take 500 mg by mouth 3 (three) times daily. For 10 days. 04/14/14  Yes Historical Provider, MD  NITROSTAT 0.4 MG SL tablet Place 0.4 mg under the tongue every 5 (five) minutes as needed for chest pain.  07/05/11  Yes Historical Provider, MD  omeprazole (PRILOSEC) 40 MG capsule Take 40 mg by  mouth daily as needed (for reflux).    Yes Historical Provider, MD  Pancrelipase, Lip-Prot-Amyl, (CREON) 24000 UNITS CPEP Take 1 capsule (24,000 Units total) by mouth 3 (three) times daily before meals. 04/13/14  Yes Volanda Napoleon, MD  Probiotic Product (PROBIOTIC DAILY PO) Take 1 tablet by mouth every morning.    Yes Historical Provider, MD  spironolactone (ALDACTONE) 50 MG tablet Take 50 mg by mouth every morning.   Yes Historical Provider, MD  sucralfate (CARAFATE) 1 G tablet Take 1 g by mouth 2 (two) times daily.   Yes Historical Provider, MD  triamcinolone cream (KENALOG) 0.1 % Apply 1 application topically 2 (two) times daily as needed. Eczema 07/09/12  Yes Historical Provider, MD   BP 130/54  Pulse 71  Temp(Src) 98.4 F (36.9 C) (Oral)  Resp 24  Ht 5\' 6"  (1.676 m)  Wt 176 lb (79.833 kg)  BMI 28.42 kg/m2  SpO2 96% Physical Exam  Nursing note and vitals reviewed. Constitutional:  Chronically ill, frail appearing  HENT:  Head: Normocephalic and atraumatic.  Eyes: Conjunctivae are normal. Pupils are equal, round, and reactive to light.  Neck: Neck supple. No tracheal deviation present. No thyromegaly present.  Cardiovascular: Normal rate and regular rhythm.   No murmur heard. Pulmonary/Chest: Effort normal and breath sounds normal.  Abdominal: Soft. Bowel sounds are normal. She exhibits no distension. There is no tenderness.  Musculoskeletal: Normal range of motion. She exhibits edema. She exhibits no tenderness.  Left lower extremity mildly swollen tender at the calf. Calf and foot are warm red and tender. DP pulse 2+. All other extremities without redness swelling or tenderness neurovascularly intact   Neurological: She is alert. Coordination normal.  Skin: Skin is warm and dry. No rash noted.  Psychiatric: She has a normal mood and affect.    ED Course  Procedures (including critical care time) Labs Review Labs Reviewed - No data to display  Imaging Review No results  found.   EKG Interpretation None      Results for orders placed during the hospital encounter of 04/17/14  TROPONIN I      Result Value Ref Range   Troponin I <0.30  <0.30 ng/mL  COMPREHENSIVE METABOLIC PANEL      Result Value Ref Range   Sodium 136 (*) 137 - 147 mEq/L   Potassium 4.2  3.7 - 5.3 mEq/L   Chloride 99  96 - 112 mEq/L   CO2 23  19 - 32 mEq/L   Glucose, Bld 129 (*) 70 - 99 mg/dL   BUN 12  6 - 23 mg/dL   Creatinine, Ser 0.87  0.50 - 1.10 mg/dL   Calcium 9.6  8.4 - 10.5 mg/dL  Total Protein 6.6  6.0 - 8.3 g/dL   Albumin 3.2 (*) 3.5 - 5.2 g/dL   AST 42 (*) 0 - 37 U/L   ALT 31  0 - 35 U/L   Alkaline Phosphatase 114  39 - 117 U/L   Total Bilirubin 1.2  0.3 - 1.2 mg/dL   GFR calc non Af Amer 60 (*) >90 mL/min   GFR calc Af Amer 69 (*) >90 mL/min   Anion gap 14  5 - 15  D-DIMER, QUANTITATIVE      Result Value Ref Range   D-Dimer, Quant 0.44  0.00 - 0.48 ug/mL-FEU  CBC WITH DIFFERENTIAL      Result Value Ref Range   WBC 7.2  4.0 - 10.5 K/uL   RBC 4.13  3.87 - 5.11 MIL/uL   Hemoglobin 13.1  12.0 - 15.0 g/dL   HCT 38.9  36.0 - 46.0 %   MCV 94.2  78.0 - 100.0 fL   MCH 31.7  26.0 - 34.0 pg   MCHC 33.7  30.0 - 36.0 g/dL   RDW 12.9  11.5 - 15.5 %   Platelets 131 (*) 150 - 400 K/uL   Neutrophils Relative % 67  43 - 77 %   Neutro Abs 4.8  1.7 - 7.7 K/uL   Lymphocytes Relative 15  12 - 46 %   Lymphs Abs 1.1  0.7 - 4.0 K/uL   Monocytes Relative 16 (*) 3 - 12 %   Monocytes Absolute 1.2 (*) 0.1 - 1.0 K/uL   Eosinophils Relative 2  0 - 5 %   Eosinophils Absolute 0.1  0.0 - 0.7 K/uL   Basophils Relative 0  0 - 1 %   Basophils Absolute 0.0  0.0 - 0.1 K/uL   Dg Chest 2 View  04/17/2014   CLINICAL DATA:  Edema and redness in the legs.  Hacking cough.  EXAM: CHEST  2 VIEW  COMPARISON:  Chest x-ray 11/04/2013.  FINDINGS: Moderate right pleural effusion increased compared to the prior study. Associated atelectasis in the base of the right lung. Left lung appears clear. No left  pleural effusion. No evidence of pulmonary edema. Heart size is normal. Large hiatal hernia again noted. Upper mediastinal contours are within normal limits. Atherosclerosis in the thoracic aorta.  IMPRESSION: 1. Interval accumulation of moderate right pleural effusion. 2. Large hiatal hernia. 3. Atherosclerosis.   Electronically Signed   By: Vinnie Langton M.D.   On: 04/17/2014 11:40    Chest xray viewed by me. Noninvasive Doppler study of left leg performed. Negative for DVT MDM  Spoke with Dr.Elmahi plan admit medical surgical floor for intravenous antibiotics Diagnosis #1 cellulitis of left leg #2 right pleural effusion #3 thrombocytopenia Final diagnoses:  None        Orlie Dakin, MD 04/17/14 1312

## 2014-04-18 LAB — BASIC METABOLIC PANEL
Anion gap: 11 (ref 5–15)
BUN: 12 mg/dL (ref 6–23)
CO2: 23 meq/L (ref 19–32)
Calcium: 8.7 mg/dL (ref 8.4–10.5)
Chloride: 103 mEq/L (ref 96–112)
Creatinine, Ser: 1.05 mg/dL (ref 0.50–1.10)
GFR calc Af Amer: 55 mL/min — ABNORMAL LOW (ref >90)
GFR, EST NON AFRICAN AMERICAN: 48 mL/min — AB (ref >90)
GLUCOSE: 82 mg/dL (ref 70–99)
POTASSIUM: 3.8 meq/L (ref 3.7–5.3)
Sodium: 137 mEq/L (ref 137–147)

## 2014-04-18 LAB — CBC
HEMATOCRIT: 37.4 % (ref 36.0–46.0)
HEMOGLOBIN: 12.6 g/dL (ref 12.0–15.0)
MCH: 32.5 pg (ref 26.0–34.0)
MCHC: 33.7 g/dL (ref 30.0–36.0)
MCV: 96.4 fL (ref 78.0–100.0)
PLATELETS: 110 10*3/uL — AB (ref 150–400)
RBC: 3.88 MIL/uL (ref 3.87–5.11)
RDW: 13.1 % (ref 11.5–15.5)
WBC: 5.6 10*3/uL (ref 4.0–10.5)

## 2014-04-18 MED ORDER — BOOST PLUS PO LIQD
237.0000 mL | Freq: Two times a day (BID) | ORAL | Status: DC
  Administered 2014-04-18 – 2014-04-20 (×4): 237 mL via ORAL
  Filled 2014-04-18 (×7): qty 237

## 2014-04-18 NOTE — Progress Notes (Signed)
[facility] PROGRESS NOTE Interim History: 80 y.o. female with past medical history of HTN, cirrhosis and hepatoma status post ablation, came in to the hospital because of left lower extremity swelling, redness, pain and tenderness. Patient also has group of a vague symptoms including generalized weakness, loss of appetite and feeling of unwellness generally.  In the ED patient was found to have left lower extremity cellulitis  Assessment/Plan: Cellulitis of left lower extremity: - Started on vancomycin 7.26.2015. Afebrile, no leukocytosis. - Marked on 7.27.2015 - c.dif pending  Right-sided pleural effusion  -Patient has chronic right-sided pleural effusion secondary to cirrhosis.  -Restart diuretics.  Diarrhea  - Patient mentioned she does have diarrhea for some time.  - When she saw Dr. Marin Olp 3 days ago she was started on Flagyl, probiotic and pancreatic enzymes.  - C. Dif pending 7.26.2015.  History of hepatoma  -Status post ablation, follows with oncology as outpatient.   Hepatic cirrhosis  -With mild hyponatremia, thrombocytopenia, hypoalbuminemia and transaminasemia.  -No evidence of hepatic encephalopathy.    Code Status: full Family Communication: none  Disposition Plan: inpaitnet   Consultants:  none  Procedures:  none  Antibiotics:  vnac 7326.2015>>    HPI/Subjective: Leg pain with ambulation  Objective: Filed Vitals:   04/17/14 1330 04/17/14 1428 04/17/14 2109 04/18/14 0559  BP: 124/52 140/56 106/54 118/66  Pulse: 64 72 60 70  Temp:  98.7 F (37.1 C) 98.2 F (36.8 C) 98.8 F (37.1 C)  TempSrc:  Oral Oral Oral  Resp: 30 20 18 18   Height:  5\' 6"  (1.676 m)    Weight:  79.84 kg (176 lb 0.2 oz)    SpO2: 96% 97% 94% 94%   No intake or output data in the 24 hours ending 04/18/14 0739 Filed Weights   04/17/14 1020 04/17/14 1428  Weight: 79.833 kg (176 lb) 79.84 kg (176 lb 0.2 oz)    Exam:  General: Alert, awake, oriented x3, in  no acute distress.  HEENT: No bruits, no goiter.  Heart: Regular rate and rhythm. Lungs: Good air movement, cleart.  Abdomen: Soft, nontender, nondistended, positive bowel sounds.     Data Reviewed: Basic Metabolic Panel:  Recent Labs Lab 04/13/14 1019 04/17/14 1121 04/18/14 0632  NA 134 136* 137  K 3.7 4.2 3.8  CL 101 99 103  CO2 29 23 23   GLUCOSE 127* 129* 82  BUN 11 12 12   CREATININE 1.1 0.87 1.05  CALCIUM 9.1 9.6 8.7   Liver Function Tests:  Recent Labs Lab 04/17/14 1121  AST 42*  ALT 31  ALKPHOS 114  BILITOT 1.2  PROT 6.6  ALBUMIN 3.2*   No results found for this basename: LIPASE, AMYLASE,  in the last 168 hours No results found for this basename: AMMONIA,  in the last 168 hours CBC:  Recent Labs Lab 04/13/14 1019 04/17/14 1121 04/18/14 0632  WBC 5.9 7.2 5.6  NEUTROABS 3.3 4.8  --   HGB 13.9 13.1 12.6  HCT 40.5 38.9 37.4  MCV 96 94.2 96.4  PLT 130* 131* PENDING   Cardiac Enzymes:  Recent Labs Lab 04/17/14 1121  TROPONINI <0.30   BNP (last 3 results) No results found for this basename: PROBNP,  in the last 8760 hours CBG: No results found for this basename: GLUCAP,  in the last 168 hours  No results found for this or any previous visit (from the past 240 hour(s)).   Studies: Dg Chest 2 View  04/17/2014   CLINICAL DATA:  Edema and redness in the legs.  Hacking cough.  EXAM: CHEST  2 VIEW  COMPARISON:  Chest x-ray 11/04/2013.  FINDINGS: Moderate right pleural effusion increased compared to the prior study. Associated atelectasis in the base of the right lung. Left lung appears clear. No left pleural effusion. No evidence of pulmonary edema. Heart size is normal. Large hiatal hernia again noted. Upper mediastinal contours are within normal limits. Atherosclerosis in the thoracic aorta.  IMPRESSION: 1. Interval accumulation of moderate right pleural effusion. 2. Large hiatal hernia. 3. Atherosclerosis.   Electronically Signed   By: Vinnie Langton  M.D.   On: 04/17/2014 11:40    Scheduled Meds: . atenolol  50 mg Oral Daily  . cholecalciferol  1,000 Units Oral Daily  . furosemide  40 mg Intravenous BID  . heparin  5,000 Units Subcutaneous 3 times per day  . lipase/protease/amylase  2 capsule Oral TID WC  . pantoprazole  40 mg Oral Daily  . sodium chloride  3 mL Intravenous Q12H  . spironolactone  50 mg Oral Daily  . vancomycin  1,500 mg Intravenous Q24H   Continuous Infusions:    Charlynne Cousins  [facility] Pager [phone]. If 8PM-8AM, please contact night-coverage at [email or web address], password [facility] 04/18/2014, 7:39 AM  LOS: 1 day     **Disclaimer: This note may have been dictated with voice recognition software. Similar sounding words can inadvertently be transcribed and this note may contain transcription errors which may not have been corrected upon publication of note.**

## 2014-04-18 NOTE — Progress Notes (Signed)
INITIAL NUTRITION ASSESSMENT  DOCUMENTATION CODES Per approved criteria  -Not Applicable   INTERVENTION: - Boost Plus BID  NUTRITION DIAGNOSIS: Inadequate oral intake related to diarrhea and cellulitis as evidenced by reported intake less than estimated needs and wt loss.   Goal: Pt to meet >/= 90% of their estimated nutrition needs   Monitor:  Weight trends, po intake, labs  Reason for Assessment: MST  80 y.o. female  Admitting Dx: Cellulitis of left lower extremity  ASSESSMENT: 80 y.o. female with past medical history of HTN, cirrhosis and hepatoma status post ablation, came in to the hospital because of left lower extremity swelling, redness, pain and tenderness. Patient also has group of a vague symptoms including generalized weakness, loss of appetite and feeling of unwellness generally.   - Pt's daughter reports that pt has been eating more at the hospital than she was at home. He usual body weight is 186 lbs. She drinks boost once per day most days at home. Pt with no signs of significant fat and/or muscle wasting.  Na and K WNL CBGs WNL  Height: Ht Readings from Last 1 Encounters:  04/17/14 5\' 6"  (1.676 m)    Weight: Wt Readings from Last 1 Encounters:  04/17/14 176 lb 0.2 oz (79.84 kg)    Ideal Body Weight: 59.3 kg  % Ideal Body Weight: 135%  Wt Readings from Last 10 Encounters:  04/17/14 176 lb 0.2 oz (79.84 kg)  04/13/14 176 lb (79.833 kg)  03/31/14 181 lb (82.101 kg)  01/20/14 182 lb 12.8 oz (82.918 kg)  12/16/13 174 lb 3.2 oz (79.017 kg)  11/29/13 173 lb 6.4 oz (78.654 kg)  11/04/13 188 lb (85.276 kg)  10/26/13 190 lb (86.183 kg)  09/10/13 186 lb 9.6 oz (84.641 kg)  09/05/13 182 lb 12.2 oz (82.9 kg)    Usual Body Weight: 186 lbs  % Usual Body Weight: 95%  BMI:  Body mass index is 28.42 kg/(m^2).  Estimated Nutritional Needs: Kcal: 1900-2100 Protein: 105-115 g Fluid: ~2.0 L/day  Skin: Intact  Diet Order: Cardiac  EDUCATION  NEEDS: -Education needs addressed   Intake/Output Summary (Last 24 hours) at 04/18/14 1421 Last data filed at 04/18/14 0903  Gross per 24 hour  Intake    360 ml  Output      0 ml  Net    360 ml    Last BM: PTA   Labs:   Recent Labs Lab 04/13/14 1019 04/17/14 1121 04/18/14 0632  NA 134 136* 137  K 3.7 4.2 3.8  CL 101 99 103  CO2 29 23 23   BUN 11 12 12   CREATININE 1.1 0.87 1.05  CALCIUM 9.1 9.6 8.7  GLUCOSE 127* 129* 82    CBG (last 3)  No results found for this basename: GLUCAP,  in the last 72 hours  Scheduled Meds: . atenolol  50 mg Oral Daily  . cholecalciferol  1,000 Units Oral Daily  . furosemide  40 mg Intravenous BID  . heparin  5,000 Units Subcutaneous 3 times per day  . lipase/protease/amylase  2 capsule Oral TID WC  . pantoprazole  40 mg Oral Daily  . sodium chloride  3 mL Intravenous Q12H  . spironolactone  50 mg Oral Daily  . vancomycin  1,500 mg Intravenous Q24H    Continuous Infusions:   Past Medical History  Diagnosis Date  . Anemia, iron deficiency 08/07/2011  . Angiodysplasia of intestinal tract 08/07/2011  . Hypertension   . Arthritis   . Diverticulitis   .  Phlebitis   . Macular degeneration   . TIA (transient ischemic attack)   . MI (myocardial infarction)   . Hiatal hernia   . AVM (arteriovenous malformation) of colon with hemorrhage   . Varicose veins of legs   . Cataract   . GERD (gastroesophageal reflux disease)   . DJD (degenerative joint disease)   . Diabetes mellitus     borderline  . History of blood transfusion   . PONV (postoperative nausea and vomiting)   . hepatocellar ca dx'd 08/2012  . Edema leg   . Fatty liver   . Vitamin D deficiency     Past Surgical History  Procedure Laterality Date  . 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 Abdominal hysterectomy    . Hemorrhoid surgery    . Esophagogastroduodenoscopy  08/30/2012    Procedure: ESOPHAGOGASTRODUODENOSCOPY (EGD);  Surgeon: Jeryl Columbia,  MD;  Location: [facility];  Service: Endoscopy;  Laterality: N/A;  . Radiofrequency ablation liver tumor Right 01/2013    HFA ablation right liver lesion  . Colonoscopy  06/2009    Dr. Watt Climes, due 5 years  . Endoscopic vein laser treatment Left 2014    Dr. Jamesetta Geralds RD, LDN

## 2014-04-18 NOTE — Progress Notes (Signed)
Utilization review completed.  

## 2014-04-19 MED ORDER — FUROSEMIDE 40 MG PO TABS
60.0000 mg | ORAL_TABLET | Freq: Every day | ORAL | Status: DC
  Administered 2014-04-19 – 2014-04-20 (×2): 60 mg via ORAL
  Filled 2014-04-19 (×2): qty 1

## 2014-04-19 NOTE — Progress Notes (Signed)
[facility] PROGRESS NOTE Interim History: 80 y.o. female with past medical history of HTN, cirrhosis and hepatoma status post ablation, came in to the hospital because of left lower extremity swelling, redness, pain and tenderness. Patient also has group of a vague symptoms including generalized weakness, loss of appetite and feeling of unwellness generally.  In the ED patient was found to have left lower extremity cellulitis  Assessment/Plan: Cellulitis of left lower extremity: - Started on vancomycin 7.26.2015. Afebrile, no leukocytosis.redness/purplelish color improved, able to ambulate - Marked on 7.27.2015 - no further diarhea  Right-sided pleural effusion  -Patient has chronic right-sided pleural effusion secondary to cirrhosis.  -Restart diuretics. Will need to go home on lasix and aldactone.  Diarrhea  - Patient mentioned she does have diarrhea for some time.  - When she saw Dr. Marin Olp 3 days ago she was started on Flagyl, probiotic and pancreatic enzymes.  - no further diarrhea.  History of hepatoma  -Status post ablation, follows with oncology as outpatient.   Hepatic cirrhosis  -With mild hyponatremia, noe improved. - home on aldactone and lasix. -No evidence of hepatic encephalopathy.    Code Status: full Family Communication: none  Disposition Plan: inpaitnet   Consultants:  none  Procedures:  none  Antibiotics:  vnac 7326.2015>>    HPI/Subjective: Leg pain with ambulation improved, not resolved.  Objective: Filed Vitals:   04/18/14 0559 04/18/14 1441 04/18/14 1951 04/19/14 0540  BP: 118/66 122/71 154/82 116/53  Pulse: 70 79 76 72  Temp: 98.8 F (37.1 C) 99.7 F (37.6 C) 98 F (36.7 C) 98.3 F (36.8 C)  TempSrc: Oral Oral Oral Oral  Resp: 18 18 18 18   Height:      Weight:      SpO2: 94% 95% 96% 91%    Intake/Output Summary (Last 24 hours) at 04/19/14 0853 Last data filed at 04/18/14 0903  Gross per 24 hour  Intake     360 ml  Output      0 ml  Net    360 ml   Filed Weights   04/17/14 1020 04/17/14 1428  Weight: 79.833 kg (176 lb) 79.84 kg (176 lb 0.2 oz)    Exam:  General: Alert, awake, oriented x3, in no acute distress.  HEENT: No bruits, no goiter.  Heart: Regular rate and rhythm. Lungs: Good air movement, cleart.  Abdomen: Soft, nontender, nondistended, positive bowel sounds.     Data Reviewed: Basic Metabolic Panel:  Recent Labs Lab 04/13/14 1019 04/17/14 1121 04/18/14 0632  NA 134 136* 137  K 3.7 4.2 3.8  CL 101 99 103  CO2 29 23 23   GLUCOSE 127* 129* 82  BUN 11 12 12   CREATININE 1.1 0.87 1.05  CALCIUM 9.1 9.6 8.7   Liver Function Tests:  Recent Labs Lab 04/17/14 1121  AST 42*  ALT 31  ALKPHOS 114  BILITOT 1.2  PROT 6.6  ALBUMIN 3.2*   No results found for this basename: LIPASE, AMYLASE,  in the last 168 hours No results found for this basename: AMMONIA,  in the last 168 hours CBC:  Recent Labs Lab 04/13/14 1019 04/17/14 1121 04/18/14 0632  WBC 5.9 7.2 5.6  NEUTROABS 3.3 4.8  --   HGB 13.9 13.1 12.6  HCT 40.5 38.9 37.4  MCV 96 94.2 96.4  PLT 130* 131* 110*   Cardiac Enzymes:  Recent Labs Lab 04/17/14 1121  TROPONINI <0.30   BNP (last 3 results) No results found for this basename: PROBNP,  in the last 8760 hours CBG: No results found for this basename: GLUCAP,  in the last 168 hours  No results found for this or any previous visit (from the past 240 hour(s)).   Studies: Dg Chest 2 View  04/17/2014   CLINICAL DATA:  Edema and redness in the legs.  Hacking cough.  EXAM: CHEST  2 VIEW  COMPARISON:  Chest x-ray 11/04/2013.  FINDINGS: Moderate right pleural effusion increased compared to the prior study. Associated atelectasis in the base of the right lung. Left lung appears clear. No left pleural effusion. No evidence of pulmonary edema. Heart size is normal. Large hiatal hernia again noted. Upper mediastinal contours are within normal limits.  Atherosclerosis in the thoracic aorta.  IMPRESSION: 1. Interval accumulation of moderate right pleural effusion. 2. Large hiatal hernia. 3. Atherosclerosis.   Electronically Signed   By: Vinnie Langton M.D.   On: 04/17/2014 11:40    Scheduled Meds: . atenolol  50 mg Oral Daily  . cholecalciferol  1,000 Units Oral Daily  . furosemide  40 mg Intravenous BID  . heparin  5,000 Units Subcutaneous 3 times per day  . lactose free nutrition  237 mL Oral BID BM  . lipase/protease/amylase  2 capsule Oral TID WC  . pantoprazole  40 mg Oral Daily  . sodium chloride  3 mL Intravenous Q12H  . spironolactone  50 mg Oral Daily  . vancomycin  1,500 mg Intravenous Q24H   Continuous Infusions:    Charlynne Cousins  [facility] Pager [phone]. If 8PM-8AM, please contact night-coverage at [email or web address], password [facility] 04/19/2014, 8:53 AM  LOS: 2 days     **Disclaimer: This note may have been dictated with voice recognition software. Similar sounding words can inadvertently be transcribed and this note may contain transcription errors which may not have been corrected upon publication of note.**

## 2014-04-19 NOTE — Progress Notes (Signed)
PT had CT scan of lower bowels scheduled for Thursday at [facility] by Dr Watt Climes. Laurita Quint, pt daughter cancelled appt and per Dr and was told this need to be done as a T1 consult and needed to be done while in hospital.

## 2014-04-20 MED ORDER — BOOST PLUS PO LIQD: 237.0000 mL | Freq: Two times a day (BID) | ORAL | Status: DC

## 2014-04-20 MED ORDER — FUROSEMIDE 40 MG PO TABS: 40.0000 mg | ORAL_TABLET | Freq: Every day | ORAL | Status: DC

## 2014-04-20 MED ORDER — CEPHALEXIN 500 MG PO CAPS: 500.0000 mg | ORAL_CAPSULE | Freq: Three times a day (TID) | ORAL | Status: DC

## 2014-04-20 NOTE — Care Management Note (Signed)
    Page 1 of 1   04/20/2014     2:27:17 PM CARE MANAGEMENT NOTE 04/20/2014  Patient:  [name], [name]   Account Number:  [id number]  Date Initiated:  04/19/2014  Documentation initiated by:  Tomi Bamberger  Subjective/Objective Assessment:   dx cellulitis of le  admit- from home     Action/Plan:   pt eval-no pt f/u needed   Anticipated DC Date:  04/20/2014   Anticipated DC Plan:  [place]  CM consult      Choice offered to / List presented to:             Status of service:  Completed, signed off Medicare Important Message given?  YES (If response is "NO", the following Medicare IM given date fields will be blank) Date Medicare IM given:  04/20/2014 Medicare IM given by:  Tomi Bamberger Date Additional Medicare IM given:   Additional Medicare IM given by:    Discharge Disposition:  HOME/SELF CARE  Per UR Regulation:  Reviewed for med. necessity/level of care/duration of stay  If discussed at [place] of Stay Meetings, dates discussed:    Comments:  04/20/14 [place], BSN [phone] patient is from home , await pt eval.  Per physical therapy rec  no pt f/u needed.

## 2014-04-20 NOTE — Evaluation (Addendum)
Physical Therapy Evaluation Patient Details Name: [name] MRN: [id number] DOB: 1928-01-13 Today's Date: 04/20/2014   History of Present Illness  Patient is a 80 y/o female admitted with LLE cellulitis. PMH positive for HTN, cirrhosis and hepatoma s/p ablation.  Clinical Impression  Patient is functioning close to baseline and able to safely ambulate community distances without LOB or difficulty. Pt independent at home with ADLs and IADLs and has an active lifestyle with no falls reported for over 1 year. Pt does not require skilled therapy services at this time and safe to discharge to independent living facility from a mobility standpoint.    Follow Up Recommendations No PT follow up    Equipment Recommendations  None recommended by PT    Recommendations for Other Services       Precautions / Restrictions Precautions Precautions: None Restrictions Weight Bearing Restrictions: No      Mobility  Bed Mobility Overal bed mobility: Modified Independent                Transfers Overall transfer level: Needs assistance Equipment used: None Transfers: Sit to/from Stand;Stand Pivot Transfers Sit to Stand: Independent Stand pivot transfers: Modified independent (Device/Increase time)       General transfer comment: Stood from EOB x1 without AD, SPT to toilet with use of grab bars (uses them at home as well).  Ambulation/Gait Ambulation/Gait assistance: Independent   Assistive device: None Gait Pattern/deviations: Step-through pattern Ambulated ~200' independently without LOB or difficulty.     General Gait Details: No LOB noted during ambulation. Pt reports ambulating at baseline.  Stairs            Wheelchair Mobility    Modified Rankin (Stroke Patients Only)       Balance Overall balance assessment: Independent                                           Pertinent Vitals/Pain Pt reports some soreness in LLE however no pain.  Pt repositioned at end of evaluation for comfort. No SOB present.    Home Living Family/patient expects to be discharged to:: Private residence (Independent living facility.) Living Arrangements: Alone Available Help at Discharge: Family;Available PRN/intermittently Type of Home: Apartment Home Access: Level entry     Home Layout: One level Home Equipment: Walker - 2 wheels;Bedside commode;Grab bars - tub/shower;Shower seat      Prior Function Level of Independence: Independent         Comments: Still driving, uses RW PRN as needed for community ambulation.     Hand Dominance   Dominant Hand: Right    Extremity/Trunk Assessment   Upper Extremity Assessment: Overall WFL for tasks assessed           Lower Extremity Assessment: Overall WFL for tasks assessed         Communication   Communication: No difficulties  Cognition Arousal/Alertness: Awake/alert Behavior During Therapy: WFL for tasks assessed/performed Overall Cognitive Status: Within Functional Limits for tasks assessed                      General Comments General comments (skin integrity, edema, etc.): Discoloration BLEs distal to knees. Redness and warmth present LLE distal shin.    Exercises        Assessment/Plan    PT Assessment Patent does not need any further PT services  PT Diagnosis  PT Problem List    PT Treatment Interventions     PT Goals (Current goals can be found in the Care Plan section) Acute Rehab PT Goals PT Goal Formulation: No goals set, d/c therapy    Frequency     Barriers to discharge        Co-evaluation               End of Session Equipment Utilized During Treatment: Gait belt Activity Tolerance: Patient tolerated treatment well Patient left: in bed;with call bell/phone within reach Nurse Communication: Mobility status         Time: 5498-2641 PT Time Calculation (min): 13 min   Charges:   PT Evaluation $Initial PT Evaluation  Tier I: 1 Procedure     PT G CodesCandy Sledge A 04/20/2014, 2:04 PM Candy Sledge, [place], [place]

## 2014-04-20 NOTE — Discharge Instructions (Signed)

## 2014-04-20 NOTE — Progress Notes (Signed)
Pt refused to take sq heparin this am and wishes to discuss with doctor this morning on rounds.  After looking back thru [facility], it appears she hasn't taken the last 5-6 doses. Med is still a current order.  Please address with pt this am. Thanks, nrsg.

## 2014-04-20 NOTE — Progress Notes (Signed)
NURSING PROGRESS NOTE  [name] [id number] Discharge Data: 04/20/2014 4:31 PM Attending Provider: Jonetta Osgood, MD WGN:[id number],MVHQION DAVID, MD     Barnetta Hammersmith to be D/C'd Home per MD order.  Discussed with the patient and patient's granddaughter, Nira Conn the After Visit Summary and all questions fully answered. All IV's discontinued with no bleeding noted. All belongings returned to patient for patient to take home.   Last Vital Signs:  Blood pressure 119/70, pulse 71, temperature 98 F (36.7 C), temperature source Oral, resp. rate 16, height 5\' 6"  (1.676 m), weight 79.84 kg (176 lb 0.2 oz), SpO2 97.00%.  Discharge Medication List   Medication List    STOP taking these medications       metroNIDAZOLE 500 MG tablet  Commonly known as:  FLAGYL      TAKE these medications       ALPRAZolam 1 MG tablet  Commonly known as:  XANAX  Take 0.5 tablets (0.5 mg total) by mouth at bedtime as needed for sleep or anxiety.     atenolol 50 MG tablet  Commonly known as:  TENORMIN  Take 1 tablet (50 mg total) by mouth daily.     cephALEXin 500 MG capsule  Commonly known as:  KEFLEX  Take 1 capsule (500 mg total) by mouth 3 (three) times daily.     cholecalciferol 1000 UNITS tablet  Commonly known as:  VITAMIN D  Take 1,000 Units by mouth daily.     cyclobenzaprine 10 MG tablet  Commonly known as:  FLEXERIL  Take 10 mg by mouth 2 (two) times daily as needed for muscle spasms.     furosemide 40 MG tablet  Commonly known as:  LASIX  Take 1 tablet (40 mg total) by mouth daily.     hyoscyamine 0.375 MG 12 hr tablet  Commonly known as:  LEVBID  Take 0.375 mg by mouth 2 (two) times daily.     lactose free nutrition Liqd  Take 237 mLs by mouth 2 (two) times daily between meals.     NITROSTAT 0.4 MG SL tablet  Generic drug:  nitroGLYCERIN  Place 0.4 mg under the tongue every 5 (five) minutes as needed for chest pain.     omeprazole 40 MG capsule  Commonly known as:   PRILOSEC  Take 40 mg by mouth daily as needed (for reflux).     Pancrelipase (Lip-Prot-Amyl) 24000 UNITS Cpep  Commonly known as:  CREON  Take 1 capsule (24,000 Units total) by mouth 3 (three) times daily before meals.     PROBIOTIC DAILY PO  Take 1 tablet by mouth every morning.     spironolactone 50 MG tablet  Commonly known as:  ALDACTONE  Take 50 mg by mouth every morning.     sucralfate 1 G tablet  Commonly known as:  CARAFATE  Take 1 g by mouth 2 (two) times daily.     triamcinolone cream 0.1 %  Commonly known as:  KENALOG  Apply 1 application topically 2 (two) times daily as needed. Eczema         Ehren Berisha, RN

## 2014-04-20 NOTE — Discharge Summary (Signed)
PATIENT DETAILS Name: [name] Age: 79 y.o. Sex: female Date of Birth: [birth year] MRN: [id number]. Admit Date: 04/17/2014 Admitting Physician: Verlee Monte, MD [facility]:[email or web address],UMPNTIR DAVID, MD  Recommendations for Outpatient Follow-up:  1. Schedule a follow-up appointment with your PCP in 1 week  PRIMARY DISCHARGE DIAGNOSIS:  Principal Problem:   Cellulitis of left lower extremity Active Problems:   Hypertension   Cirrhosis   Portal hypertension   Pleural effusion on right   Hepatoma      PAST MEDICAL HISTORY: Past Medical History  Diagnosis Date  . Anemia, iron deficiency 08/07/2011  . Angiodysplasia of intestinal tract 08/07/2011  . Hypertension   . Arthritis   . Diverticulitis   . Phlebitis   . Macular degeneration   . TIA (transient ischemic attack)   . MI (myocardial infarction)   . Hiatal hernia   . AVM (arteriovenous malformation) of colon with hemorrhage   . Varicose veins of legs   . Cataract   . GERD (gastroesophageal reflux disease)   . DJD (degenerative joint disease)   . Diabetes mellitus     borderline  . History of blood transfusion   . PONV (postoperative nausea and vomiting)   . hepatocellar ca dx'd 08/2012  . Edema leg   . Fatty liver   . Vitamin D deficiency     DISCHARGE MEDICATIONS:   Medication List    STOP taking these medications       metroNIDAZOLE 500 MG tablet  Commonly known as:  FLAGYL      TAKE these medications       ALPRAZolam 1 MG tablet  Commonly known as:  XANAX  Take 0.5 tablets (0.5 mg total) by mouth at bedtime as needed for sleep or anxiety.     atenolol 50 MG tablet  Commonly known as:  TENORMIN  Take 1 tablet (50 mg total) by mouth daily.     cephALEXin 500 MG capsule  Commonly known as:  KEFLEX  Take 1 capsule (500 mg total) by mouth 3 (three) times daily.     cholecalciferol 1000 UNITS tablet  Commonly known as:  VITAMIN D  Take 1,000 Units by mouth daily.     cyclobenzaprine 10 MG tablet    Commonly known as:  FLEXERIL  Take 10 mg by mouth 2 (two) times daily as needed for muscle spasms.     furosemide 40 MG tablet  Commonly known as:  LASIX  Take 1 tablet (40 mg total) by mouth daily.     hyoscyamine 0.375 MG 12 hr tablet  Commonly known as:  LEVBID  Take 0.375 mg by mouth 2 (two) times daily.     lactose free nutrition Liqd  Take 237 mLs by mouth 2 (two) times daily between meals.     NITROSTAT 0.4 MG SL tablet  Generic drug:  nitroGLYCERIN  Place 0.4 mg under the tongue every 5 (five) minutes as needed for chest pain.     omeprazole 40 MG capsule  Commonly known as:  PRILOSEC  Take 40 mg by mouth daily as needed (for reflux).     Pancrelipase (Lip-Prot-Amyl) 24000 UNITS Cpep  Commonly known as:  CREON  Take 1 capsule (24,000 Units total) by mouth 3 (three) times daily before meals.     PROBIOTIC DAILY PO  Take 1 tablet by mouth every morning.     spironolactone 50 MG tablet  Commonly known as:  ALDACTONE  Take 50 mg by mouth every morning.  sucralfate 1 G tablet  Commonly known as:  CARAFATE  Take 1 g by mouth 2 (two) times daily.     triamcinolone cream 0.1 %  Commonly known as:  KENALOG  Apply 1 application topically 2 (two) times daily as needed. Eczema        ALLERGIES:   Allergies  Allergen Reactions  . Avelox [Moxifloxacin Hcl In Nacl] Anaphylaxis  . Statins Other (See Comments)    Liver function elevations  . Sulfa Antibiotics Hives and Itching  . Ace Inhibitors     Unknown  . Doxycycline     Unknown  . Infed [Iron Dextran] Other (See Comments)    IV IRON, Unknown  . Infergen [Interferon Al[place]acon-1]   . Vancomycin Other (See Comments)    Flushing, erythema to neck and face, tolerated vanc at lower infusion rate  . Ciprofloxacin Other (See Comments)    Complains of sore mouth  . Fenofibrate Other (See Comments)    constipation    BRIEF HPI:  See H&P, Labs, Consult and Test reports for all details in brief, patient was  admitted for left lower extremity cellulitis and acute on chronic right pleural effusion.  CONSULTATIONS:   None  PERTINENT RADIOLOGIC STUDIES: Dg Chest 2 View 04/17/2014   CLINICAL DATA:  Edema and redness in the legs.  Hacking cough.  EXAM: CHEST  2 VIEW  COMPARISON:  Chest x-ray 11/04/2013.  FINDINGS: Moderate right pleural effusion increased compared to the prior study. Associated atelectasis in the base of the right lung. Left lung appears clear. No left pleural effusion. No evidence of pulmonary edema. Heart size is normal. Large hiatal hernia again noted. Upper mediastinal contours are within normal limits. Atherosclerosis in the thoracic aorta.  IMPRESSION: 1. Interval accumulation of moderate right pleural effusion. 2. Large hiatal hernia. 3. Atherosclerosis.   Electronically Signed   By: Vinnie Langton M.D.   On: 04/17/2014 11:40   PERTINENT LAB RESULTS: CBC:  Recent Labs  04/18/14 0632  WBC 5.6  HGB 12.6  HCT 37.4  PLT 110*   CMET CMP     Component Value Date/Time   NA 137 04/18/2014 0632   NA 134 04/13/2014 1019   K 3.8 04/18/2014 0632   K 3.7 04/13/2014 1019   CL 103 04/18/2014 0632   CL 101 04/13/2014 1019   CO2 23 04/18/2014 0632   CO2 29 04/13/2014 1019   GLUCOSE 82 04/18/2014 0632   GLUCOSE 127* 04/13/2014 1019   BUN 12 04/18/2014 0632   BUN 11 04/13/2014 1019   CREATININE 1.05 04/18/2014 0632   CREATININE 1.1 04/13/2014 1019   CALCIUM 8.7 04/18/2014 0632   CALCIUM 9.1 04/13/2014 1019   PROT 6.6 04/17/2014 1121   ALBUMIN 3.2* 04/17/2014 1121   AST 42* 04/17/2014 1121   ALT 31 04/17/2014 1121   ALKPHOS 114 04/17/2014 1121   BILITOT 1.2 04/17/2014 1121   GFRNONAA 48* 04/18/2014 0632   GFRNONAA 58* 03/31/2014 1211   GFRAA 55* 04/18/2014 0632   GFRAA 67 03/31/2014 1211    GFR Estimated Creatinine Clearance: 43.3 ml/min (by C-G formula based on Cr of 1.05).  Recent Labs  04/17/14 1455  TSH 1.220    BRIEF HOSPITAL COURSE:   Principal Problem:   Cellulitis of left lower  extremity Active Problems:   Hypertension   Cirrhosis   Portal hypertension   Pleural effusion on right   Hepatoma  Cellulitis of left lower extremity: Patient was admitted for cellulitis of the left lower  extremity and acute on chronic right sided pleural effusion secondary to liver cirrhosis. She was started on IV vancomycin, lasix and spironolactone. Upon assessment today, patient appears and feels much better and will be discharged home on Keflex for 5 more days, lasix and spironolactone. She has chronic stasis dermatitis in b/l lower legs, and feels that her left leg is almost at baseline.Please note-left lower ext dopplers-prelim-negative for dvt  Right-sided pleural effusion  -Patient has chronic right-sided pleural effusion secondary to cirrhosis.  -Restart diuretics. Will discharge home on lasix and aldactone.  Diarrhea  - Patient mentioned she does have diarrhea for some time.  - When she saw Dr. Marin Olp 3 days prior to admission she was started on Flagyl, probiotic and pancreatic enzymes.  - no further diarrhea-patient has appt with Dr Penelope Coop next week, defer further work up to him Per family patient has intermittent chronic diarrhea. Will stop flagyl.  History of hepatoma  -Status post ablation, follows with oncology as outpatient.   Hepatic cirrhosis  -With mild hyponatremia, noe improved.  - home on aldactone and lasix.  -No evidence of hepatic encephalopathy.   TODAY-DAY OF DISCHARGE:  Subjective:   Starkeisha Vanwinkle today has no headache, no chest pain, abdominal pain, no new weakness tingling or numbness, feels much better wants to go home today.  Objective:   Blood pressure 118/66, pulse 68, temperature 98.1 F (36.7 C), temperature source Oral, resp. rate 16, height 5\' 6"  (1.676 m), weight 79.84 kg (176 lb 0.2 oz), SpO2 92.00%.  Intake/Output Summary (Last 24 hours) at 04/20/14 1316 Last data filed at 04/20/14 0658  Gross per 24 hour  Intake      0 ml  Output     300 ml  Net   -300 ml   Filed Weights   04/17/14 1020 04/17/14 1428  Weight: 79.833 kg (176 lb) 79.84 kg (176 lb 0.2 oz)    Exam Awake Alert, Oriented *3, No new F.N deficits, Normal affect, PERRL Supple Neck,No JVD, No cervical lymphadenopathy appriciated.  Symmetrical Chest wall movement, Good air movement bilaterally, CTAB RRR,No Gallops,Rubs or new Murmurs Positive B.Sounds, Abd Soft, Non tender, No organomegaly appreciated, No rebound -guarding or rigidity. No Cyanosis, Clubbing or edema, No new Rash or bruise Left leg-no swelling, chronic venous stasis dermatitis present-per patient almost back to usual baseline color  DISCHARGE CONDITION: Stable  DISPOSITION: Home  DISCHARGE INSTRUCTIONS:    Activity:  As tolerated with fall precautions, use walker/cane & assistance as needed  Diet recommendation: Heart Healthy diet Discharge Instructions   Diet - low sodium heart healthy    Complete by:  As directed      Increase activity slowly    Complete by:  As directed            Follow-up Information   Follow up with Alesia Richards, MD. Schedule an appointment as soon as possible for a visit in 1 week.   Specialty:  Internal Medicine   Contact information:   [address] [place] [address] [phone]       Follow up with Wonda Horner, MD. Schedule an appointment as soon as possible for a visit in 1 week.   Specialty:  Gastroenterology   Contact information:   [address] [place] [address] [phone]       Total Time spent on discharge equals 45 minutes.  Signed: Audelia Acton PA-S 04/20/2014 1:16 PM  **Disclaimer: This note may have been dictated with voice  recognition software. Similar sounding words can inadvertently be transcribed and this note may contain transcription errors which may not have been corrected upon publication of note.**

## 2014-04-21 ENCOUNTER — Other Ambulatory Visit: Payer: Medicare Other

## 2014-04-21 ENCOUNTER — Ambulatory Visit: Payer: Self-pay | Admitting: Emergency Medicine

## 2014-04-25 ENCOUNTER — Ambulatory Visit: Payer: Medicare Other | Admitting: Podiatry

## 2014-04-25 ENCOUNTER — Inpatient Hospital Stay (HOSPITAL_COMMUNITY)
Admission: EM | Admit: 2014-04-25 | Discharge: 2014-05-11 | DRG: 371 | Disposition: A | Discharge status: Other Acute Inpatient Hospital | Payer: Medicare Other | Attending: Internal Medicine | Admitting: Internal Medicine

## 2014-04-25 ENCOUNTER — Emergency Department (HOSPITAL_COMMUNITY): Payer: Medicare Other

## 2014-04-25 ENCOUNTER — Encounter (HOSPITAL_COMMUNITY): Payer: Self-pay | Admitting: Emergency Medicine

## 2014-04-25 DIAGNOSIS — K219 Gastro-esophageal reflux disease without esophagitis: Secondary | ICD-10-CM | POA: Diagnosis present

## 2014-04-25 DIAGNOSIS — J9600 Acute respiratory failure, unspecified whether with hypoxia or hypercapnia: Secondary | ICD-10-CM | POA: Diagnosis not present

## 2014-04-25 DIAGNOSIS — A0472 Enterocolitis due to Clostridium difficile, not specified as recurrent: Secondary | ICD-10-CM | POA: Diagnosis not present

## 2014-04-25 DIAGNOSIS — R5381 Other malaise: Secondary | ICD-10-CM | POA: Diagnosis not present

## 2014-04-25 DIAGNOSIS — K7460 Unspecified cirrhosis of liver: Secondary | ICD-10-CM | POA: Diagnosis present

## 2014-04-25 DIAGNOSIS — Z79899 Other long term (current) drug therapy: Secondary | ICD-10-CM

## 2014-04-25 DIAGNOSIS — E119 Type 2 diabetes mellitus without complications: Secondary | ICD-10-CM | POA: Diagnosis present

## 2014-04-25 DIAGNOSIS — B370 Candidal stomatitis: Secondary | ICD-10-CM | POA: Diagnosis present

## 2014-04-25 DIAGNOSIS — E876 Hypokalemia: Secondary | ICD-10-CM | POA: Diagnosis present

## 2014-04-25 DIAGNOSIS — Z87891 Personal history of nicotine dependence: Secondary | ICD-10-CM

## 2014-04-25 DIAGNOSIS — E872 Acidosis, unspecified: Secondary | ICD-10-CM | POA: Diagnosis present

## 2014-04-25 DIAGNOSIS — Z8673 Personal history of transient ischemic attack (TIA), and cerebral infarction without residual deficits: Secondary | ICD-10-CM

## 2014-04-25 DIAGNOSIS — W19XXXA Unspecified fall, initial encounter: Secondary | ICD-10-CM | POA: Diagnosis present

## 2014-04-25 DIAGNOSIS — J9601 Acute respiratory failure with hypoxia: Secondary | ICD-10-CM

## 2014-04-25 DIAGNOSIS — H3530 Unspecified macular degeneration: Secondary | ICD-10-CM | POA: Diagnosis present

## 2014-04-25 DIAGNOSIS — K738 Other chronic hepatitis, not elsewhere classified: Secondary | ICD-10-CM | POA: Diagnosis present

## 2014-04-25 DIAGNOSIS — L03119 Cellulitis of unspecified part of limb: Secondary | ICD-10-CM

## 2014-04-25 DIAGNOSIS — Z6827 Body mass index (BMI) 27.0-27.9, adult: Secondary | ICD-10-CM

## 2014-04-25 DIAGNOSIS — E860 Dehydration: Secondary | ICD-10-CM

## 2014-04-25 DIAGNOSIS — R5383 Other fatigue: Secondary | ICD-10-CM | POA: Diagnosis not present

## 2014-04-25 DIAGNOSIS — R531 Weakness: Secondary | ICD-10-CM

## 2014-04-25 DIAGNOSIS — I10 Essential (primary) hypertension: Secondary | ICD-10-CM

## 2014-04-25 DIAGNOSIS — C220 Liver cell carcinoma: Secondary | ICD-10-CM

## 2014-04-25 DIAGNOSIS — R188 Other ascites: Secondary | ICD-10-CM

## 2014-04-25 DIAGNOSIS — Z8672 Personal history of thrombophlebitis: Secondary | ICD-10-CM

## 2014-04-25 DIAGNOSIS — I872 Venous insufficiency (chronic) (peripheral): Secondary | ICD-10-CM | POA: Diagnosis present

## 2014-04-25 DIAGNOSIS — R599 Enlarged lymph nodes, unspecified: Secondary | ICD-10-CM | POA: Diagnosis present

## 2014-04-25 DIAGNOSIS — N179 Acute kidney failure, unspecified: Secondary | ICD-10-CM

## 2014-04-25 DIAGNOSIS — I252 Old myocardial infarction: Secondary | ICD-10-CM

## 2014-04-25 DIAGNOSIS — L089 Local infection of the skin and subcutaneous tissue, unspecified: Secondary | ICD-10-CM | POA: Diagnosis present

## 2014-04-25 DIAGNOSIS — K766 Portal hypertension: Secondary | ICD-10-CM | POA: Diagnosis present

## 2014-04-25 DIAGNOSIS — Z8505 Personal history of malignant neoplasm of liver: Secondary | ICD-10-CM

## 2014-04-25 DIAGNOSIS — Z8249 Family history of ischemic heart disease and other diseases of the circulatory system: Secondary | ICD-10-CM

## 2014-04-25 DIAGNOSIS — W19XXXS Unspecified fall, sequela: Secondary | ICD-10-CM

## 2014-04-25 DIAGNOSIS — E875 Hyperkalemia: Secondary | ICD-10-CM | POA: Diagnosis not present

## 2014-04-25 DIAGNOSIS — K7689 Other specified diseases of liver: Secondary | ICD-10-CM | POA: Diagnosis present

## 2014-04-25 DIAGNOSIS — E559 Vitamin D deficiency, unspecified: Secondary | ICD-10-CM | POA: Diagnosis present

## 2014-04-25 DIAGNOSIS — Z800 Family history of malignant neoplasm of digestive organs: Secondary | ICD-10-CM

## 2014-04-25 DIAGNOSIS — L03116 Cellulitis of left lower limb: Secondary | ICD-10-CM

## 2014-04-25 DIAGNOSIS — E43 Unspecified severe protein-calorie malnutrition: Secondary | ICD-10-CM

## 2014-04-25 DIAGNOSIS — D509 Iron deficiency anemia, unspecified: Secondary | ICD-10-CM

## 2014-04-25 DIAGNOSIS — Z881 Allergy status to other antibiotic agents status: Secondary | ICD-10-CM

## 2014-04-25 DIAGNOSIS — E871 Hypo-osmolality and hyponatremia: Secondary | ICD-10-CM | POA: Diagnosis present

## 2014-04-25 DIAGNOSIS — Y92009 Unspecified place in unspecified non-institutional (private) residence as the place of occurrence of the external cause: Secondary | ICD-10-CM

## 2014-04-25 DIAGNOSIS — D696 Thrombocytopenia, unspecified: Secondary | ICD-10-CM | POA: Diagnosis present

## 2014-04-25 DIAGNOSIS — T502X5A Adverse effect of carbonic-anhydrase inhibitors, benzothiadiazides and other diuretics, initial encounter: Secondary | ICD-10-CM | POA: Diagnosis not present

## 2014-04-25 DIAGNOSIS — K449 Diaphragmatic hernia without obstruction or gangrene: Secondary | ICD-10-CM | POA: Diagnosis present

## 2014-04-25 DIAGNOSIS — L02419 Cutaneous abscess of limb, unspecified: Secondary | ICD-10-CM | POA: Diagnosis present

## 2014-04-25 DIAGNOSIS — J90 Pleural effusion, not elsewhere classified: Secondary | ICD-10-CM

## 2014-04-25 LAB — URINALYSIS, ROUTINE W REFLEX MICROSCOPIC
Bilirubin Urine: NEGATIVE
GLUCOSE, UA: NEGATIVE mg/dL
Hgb urine dipstick: NEGATIVE
KETONES UR: NEGATIVE mg/dL
Nitrite: NEGATIVE
Protein, ur: NEGATIVE mg/dL
Specific Gravity, Urine: 1.026 (ref 1.005–1.030)
Urobilinogen, UA: 0.2 mg/dL (ref 0.0–1.0)
pH: 5 (ref 5.0–8.0)

## 2014-04-25 LAB — COMPREHENSIVE METABOLIC PANEL
ALT: 35 U/L (ref 0–35)
ANION GAP: 16 — AB (ref 5–15)
AST: 61 U/L — ABNORMAL HIGH (ref 0–37)
Albumin: 3.1 g/dL — ABNORMAL LOW (ref 3.5–5.2)
Alkaline Phosphatase: 145 U/L — ABNORMAL HIGH (ref 39–117)
BILIRUBIN TOTAL: 0.7 mg/dL (ref 0.3–1.2)
BUN: 28 mg/dL — ABNORMAL HIGH (ref 6–23)
CHLORIDE: 100 meq/L (ref 96–112)
CO2: 20 meq/L (ref 19–32)
CREATININE: 1.45 mg/dL — AB (ref 0.50–1.10)
Calcium: 10.6 mg/dL — ABNORMAL HIGH (ref 8.4–10.5)
GFR calc Af Amer: 37 mL/min — ABNORMAL LOW (ref >90)
GFR, EST NON AFRICAN AMERICAN: 32 mL/min — AB (ref >90)
Glucose, Bld: 171 mg/dL — ABNORMAL HIGH (ref 70–99)
Potassium: 3.9 mEq/L (ref 3.7–5.3)
Sodium: 136 mEq/L — ABNORMAL LOW (ref 137–147)
Total Protein: 7.2 g/dL (ref 6.0–8.3)

## 2014-04-25 LAB — CBC WITH DIFFERENTIAL/PLATELET
BASOS PCT: 0 % (ref 0–1)
Basophils Absolute: 0 10*3/uL (ref 0.0–0.1)
Eosinophils Absolute: 0 10*3/uL (ref 0.0–0.7)
Eosinophils Relative: 0 % (ref 0–5)
HEMATOCRIT: 41 % (ref 36.0–46.0)
HEMOGLOBIN: 13.9 g/dL (ref 12.0–15.0)
Lymphocytes Relative: 7 % — ABNORMAL LOW (ref 12–46)
Lymphs Abs: 0.7 10*3/uL (ref 0.7–4.0)
MCH: 32 pg (ref 26.0–34.0)
MCHC: 33.9 g/dL (ref 30.0–36.0)
MCV: 94.5 fL (ref 78.0–100.0)
MONO ABS: 0.8 10*3/uL (ref 0.1–1.0)
Monocytes Relative: 8 % (ref 3–12)
NEUTROS ABS: 8.9 10*3/uL — AB (ref 1.7–7.7)
Neutrophils Relative %: 85 % — ABNORMAL HIGH (ref 43–77)
Platelets: 138 10*3/uL — ABNORMAL LOW (ref 150–400)
RBC: 4.34 MIL/uL (ref 3.87–5.11)
RDW: 12.9 % (ref 11.5–15.5)
WBC: 10.5 10*3/uL (ref 4.0–10.5)

## 2014-04-25 LAB — URINE MICROSCOPIC-ADD ON

## 2014-04-25 LAB — LIPASE, BLOOD: Lipase: 53 U/L (ref 11–59)

## 2014-04-25 LAB — I-STAT CG4 LACTIC ACID, ED: Lactic Acid, Venous: 3.99 mmol/L — ABNORMAL HIGH (ref 0.5–2.2)

## 2014-04-25 LAB — TROPONIN I: Troponin I: <0.3 ng/mL (ref <0.30)

## 2014-04-25 LAB — POC OCCULT BLOOD, ED: Fecal Occult Bld: NEGATIVE

## 2014-04-25 MED ORDER — SODIUM CHLORIDE 0.9 % IV SOLN
INTRAVENOUS | Status: AC
  Administered 2014-04-25 – 2014-04-26 (×2): via INTRAVENOUS

## 2014-04-25 MED ORDER — MORPHINE SULFATE 2 MG/ML IJ SOLN
2.0000 mg | Freq: Once | INTRAMUSCULAR | Status: AC
  Administered 2014-04-25: 2 mg via INTRAVENOUS
  Filled 2014-04-25: qty 1

## 2014-04-25 MED ORDER — ENOXAPARIN SODIUM 40 MG/0.4ML ~~LOC~~ SOLN
40.0000 mg | SUBCUTANEOUS | Status: DC
  Administered 2014-04-25: 40 mg via SUBCUTANEOUS
  Filled 2014-04-25 (×2): qty 0.4

## 2014-04-25 MED ORDER — TRIAMCINOLONE ACETONIDE 0.1 % EX CREA
1.0000 "application " | TOPICAL_CREAM | Freq: Two times a day (BID) | CUTANEOUS | Status: DC | PRN
  Administered 2014-04-28: 1 via TOPICAL
  Filled 2014-04-25 (×2): qty 15

## 2014-04-25 MED ORDER — ACETAMINOPHEN 325 MG PO TABS
650.0000 mg | ORAL_TABLET | Freq: Once | ORAL | Status: DC
  Filled 2014-04-25: qty 2

## 2014-04-25 MED ORDER — ALPRAZOLAM 0.5 MG PO TABS
0.5000 mg | ORAL_TABLET | Freq: Every evening | ORAL | Status: DC | PRN
  Administered 2014-04-25: 0.5 mg via ORAL
  Filled 2014-04-25: qty 1

## 2014-04-25 MED ORDER — CYCLOBENZAPRINE HCL 10 MG PO TABS
10.0000 mg | ORAL_TABLET | Freq: Two times a day (BID) | ORAL | Status: DC | PRN
Start: 2014-04-25 — End: 2014-04-26

## 2014-04-25 MED ORDER — NITROGLYCERIN 0.4 MG SL SUBL: 0.4000 mg | SUBLINGUAL_TABLET | SUBLINGUAL | Status: DC | PRN

## 2014-04-25 MED ORDER — SODIUM CHLORIDE 0.9 % IV BOLUS (SEPSIS)
1000.0000 mL | Freq: Once | INTRAVENOUS | Status: AC
  Administered 2014-04-25: 1000 mL via INTRAVENOUS

## 2014-04-25 MED ORDER — IBUPROFEN 200 MG PO TABS
400.0000 mg | ORAL_TABLET | Freq: Once | ORAL | Status: AC
  Administered 2014-04-25: 400 mg via ORAL
  Filled 2014-04-25: qty 2

## 2014-04-25 NOTE — ED Provider Notes (Signed)
80 year old female, lives with family members at home, was found on the ground by her bed after she had fallen in the bathroom and crawled looking for help. She has had chronic diarrhea, recent cellulitis and has been on Keflex but no other complaints. On exam the patient has a soft abdomen, clear heart and lung sounds, no peripheral edema or signs of infection of the left lower extremity where she had cellulitis of the foot. No signs of trauma to the head, clear oropharynx is she does appear dehydrated.  Laboratory workup shows that the patient has signs of dehydration with elevated BUN, she has a lactic acidosis with a lactic acid of 4, she will need to be admitted to the hospital. Possible urinary tract infection, possible retrocardiac pneumonia, this was discussed with the family who are in agreement that the patient needs admission to hospital. We'll order broad-spectrum antibiotics, septic workup ongoing, will discussed with hospitalist.  Medical screening examination/treatment/procedure(s) were conducted as a shared visit with non-physician practitioner(s) and myself.  I personally evaluated the patient during the encounter.  Clinical Impression:   #1 - Lactic Acidosis #2 - Fall      Johnna Acosta, MD 04/26/14 1125

## 2014-04-25 NOTE — ED Notes (Signed)
Per EMS pt coming from home with c/o fever since yesterday and fall. Pt reports she was walking out of bathroom and felt weak and fell. Denies LOC, didn't hit her head,, denies neck/back pain.

## 2014-04-25 NOTE — ED Notes (Signed)
Bed: WA04 Expected date:  Expected time:  Means of arrival:  Comments: ems- fever/fall

## 2014-04-25 NOTE — ED Provider Notes (Signed)
CSN: [id number]     Arrival date & time 04/25/14  1606 History   First MD Initiated Contact with Patient 04/25/14 1621     Chief Complaint  Patient presents with  . Fall  . Fever     (Consider location/radiation/quality/duration/timing/severity/associated sxs/prior Treatment) The history is provided by the patient. No language interpreter was used.  KELBY LOTSPEICH is an 80 year old female past medical history of anemia, hypertension, diverticulitis, TIA, MI, AVM, diabetes presenting to the ED with fall and fever. As per patient, reported that ever since she's been taking the Keflex at home regarding her cellulitic infection of the left lower extremity she's been having ongoing diarrhea. Reports at least 6-7 episodes of diarrhea per day. Reported that she's been feeling weak and dehydrated. Stated that today with when going to the bathroom she fell - when asked how she fell and how she landed patient is unable to recall. Patient is accompanied by daughter and granddaughter-as per daughter's report stated the patient is been having intermittent altered mental status, more so today. Reported the patient continues to repeat herself, does not know she's talking to. Stated that when they arrived to the house today, they found the patient in the bedroom on the floor laying on her back. Stated that patient fell in the bathroom but rolled to the bedroom. Daughter reported that patient is been shivering all day. Stated that she's been having decreased appetite. Reported that she been experiencing back pain described as a soreness. Denied cough, urinary symptoms, chest pain, shortness of breath, difficulty breathing, numbness, tingling, nausea, vomiting, melena, hematochezia, headache, dizziness, visual distortions. PCP Dr. Melford Aase   Past Medical History  Diagnosis Date  . Anemia, iron deficiency 08/07/2011  . Angiodysplasia of intestinal tract 08/07/2011  . Hypertension   . Arthritis   . Diverticulitis   .  Phlebitis   . Macular degeneration   . TIA (transient ischemic attack)   . MI (myocardial infarction)   . Hiatal hernia   . AVM (arteriovenous malformation) of colon with hemorrhage   . Varicose veins of legs   . Cataract   . GERD (gastroesophageal reflux disease)   . DJD (degenerative joint disease)   . Diabetes mellitus     borderline  . History of blood transfusion   . PONV (postoperative nausea and vomiting)   . hepatocellar ca dx'd 08/2012  . Edema leg   . Fatty liver   . Vitamin D deficiency    Past Surgical History  Procedure Laterality Date  . 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 Abdominal hysterectomy    . Hemorrhoid surgery    . Esophagogastroduodenoscopy  08/30/2012    Procedure: ESOPHAGOGASTRODUODENOSCOPY (EGD);  Surgeon: Jeryl Columbia, MD;  Location: [facility];  Service: Endoscopy;  Laterality: N/A;  . Radiofrequency ablation liver tumor Right 01/2013    HFA ablation right liver lesion  . Colonoscopy  06/2009    Dr. Watt Climes, due 5 years  . Endoscopic vein laser treatment Left 2014    Dr. Kellie Simmering   Family History  Problem Relation Age of Onset  . Colon cancer Sister   . Coronary artery disease Father   . Heart disease Father    History  Substance Use Topics  . Smoking status: Former Smoker -- 5.00 packs/day for 40 years    Types: Cigarettes    Start date: 04/14/1949    Quit date: 07/23/1990  . Smokeless tobacco: Never Used     Comment: quit  smoking 23 years ago  . Alcohol Use: No   OB History   Grav Para Term Preterm Abortions TAB SAB Ect Mult Living                 Review of Systems  Constitutional: Positive for chills and fatigue. Negative for fever.  Respiratory: Negative for chest tightness and shortness of breath.   Cardiovascular: Negative for chest pain and leg swelling.  Gastrointestinal: Positive for diarrhea. Negative for nausea, vomiting, abdominal pain, constipation, blood in stool and anal bleeding.  Genitourinary:  Positive for urgency. Negative for dysuria, hematuria and decreased urine volume.  Musculoskeletal: Positive for back pain. Negative for neck pain.  Neurological: Positive for weakness. Negative for dizziness and headaches.      Allergies  Avelox; Statins; Sulfa antibiotics; Ace inhibitors; Doxycycline; Infed; Infergen; Vancomycin; Ciprofloxacin; and Fenofibrate  Home Medications   Prior to Admission medications   Medication Sig Start Date End Date Taking? Authorizing Provider  ALPRAZolam Duanne Moron) 1 MG tablet Take 0.5 tablets (0.5 mg total) by mouth at bedtime as needed for sleep or anxiety. 10/26/13  Yes Vicie Mutters, PA-C  atenolol (TENORMIN) 50 MG tablet Take 1 tablet (50 mg total) by mouth daily. 01/24/14  Yes Unk Pinto, MD  cephALEXin (KEFLEX) 500 MG capsule Take 1 capsule (500 mg total) by mouth 3 (three) times daily. 04/20/14  Yes Shanker Kristeen Mans, MD  cholecalciferol (VITAMIN D) 1000 UNITS tablet Take 1,000 Units by mouth daily.   Yes Historical Provider, MD  cyclobenzaprine (FLEXERIL) 10 MG tablet Take 10 mg by mouth 2 (two) times daily as needed for muscle spasms.    Yes Marybelle Killings, MD  furosemide (LASIX) 40 MG tablet Take 1 tablet (40 mg total) by mouth daily. 04/20/14  Yes Shanker Kristeen Mans, MD  lactose free nutrition (BOOST PLUS) LIQD Take 237 mLs by mouth 2 (two) times daily between meals. 04/20/14  Yes Shanker Kristeen Mans, MD  NITROSTAT 0.4 MG SL tablet Place 0.4 mg under the tongue every 5 (five) minutes as needed for chest pain.  07/05/11  Yes Historical Provider, MD  omeprazole (PRILOSEC) 40 MG capsule Take 40 mg by mouth daily as needed (for reflux).    Yes Historical Provider, MD  Pancrelipase, Lip-Prot-Amyl, (CREON) 24000 UNITS CPEP Take 1 capsule (24,000 Units total) by mouth 3 (three) times daily before meals. 04/13/14  Yes Volanda Napoleon, MD  Probiotic Product (PROBIOTIC DAILY PO) Take 1 tablet by mouth every morning.    Yes Historical Provider, MD  spironolactone  (ALDACTONE) 50 MG tablet Take 50 mg by mouth every morning.   Yes Historical Provider, MD  triamcinolone cream (KENALOG) 0.1 % Apply 1 application topically 2 (two) times daily as needed. Eczema 07/09/12  Yes Historical Provider, MD   BP 134/59  Pulse 87  Temp(Src) 98.6 F (37 C) (Oral)  Resp 26  SpO2 97% Physical Exam  Nursing note and vitals reviewed. Constitutional: She is oriented to person, place, and time. She appears well-developed and well-nourished. No distress.  HENT:  Head: Normocephalic and atraumatic.  Mouth/Throat: No oropharyngeal exudate.  Dry mucous membranes  Eyes: Conjunctivae and EOM are normal. Pupils are equal, round, and reactive to light. Right eye exhibits no discharge. Left eye exhibits no discharge.  Neck: Normal range of motion. Neck supple. No tracheal deviation present.  Negative neck stiffness Negative nuchal rigidity A cervical lymphadenopathy Negative meningeal signs  Cardiovascular: Normal rate, regular rhythm and normal heart sounds.  Exam reveals no  friction rub.   No murmur heard. Pulses:      Radial pulses are 2+ on the right side, and 2+ on the left side.       Dorsalis pedis pulses are 2+ on the right side, and 2+ on the left side.  Cap refill less than 3 seconds  Pulmonary/Chest: Effort normal and breath sounds normal. No respiratory distress. She has no wheezes. She has no rales. She exhibits no tenderness.  Patient stable to speak in full sentences without difficulty Negative use of accessory muscles Negative stridor Negative pain upon palpation to the chest wall-negative crepitus upon palpation  Abdominal: Soft. Bowel sounds are normal. She exhibits no distension. There is tenderness. There is no rebound and no guarding.  Negative abdominal distention Bowel sounds normal active in all 4 quadrants Abdomen soft upon palpation Discomfort upon palpation to the suprapubic region Negative guarding or rigidity Negative peritoneal signs   Genitourinary:  Rectal Exam: Negative swelling, erythema, inflammation, lesions, sores, hemorrhoids noted. Negative masses palpated. Sphincter tone strong. Negative blood on glove. Brown stools noted on glove. Exam chaperoned with nurse  Musculoskeletal: Normal range of motion. She exhibits tenderness.  Negative deformities identified to the spine. Discomfort upon palpation to the mid lumbosacral region upon palpation and bilateral paraspinal regions. Patient is full range of motion to lower tremors without difficulty or ataxia upon motion. Full ROM to upper and lower extremities without difficulty noted, negative ataxia noted.  Lymphadenopathy:    She has no cervical adenopathy.  Neurological: She is alert and oriented to person, place, and time. No cranial nerve deficit. She exhibits normal muscle tone. Coordination normal.  Cranial nerves III-XII grossly intact Strength 5+/5+ to upper and lower extremities bilaterally with resistance applied, equal distribution noted Equal grip strength bilaterally Negative facial drooping Negative slurred speech Negative aphasia Patient follows commands well Patient is able to respond to questions appropriately Negative arm drift Sensation intact  Skin: Skin is dry. No rash noted. She is not diaphoretic. There is erythema.  Left lower extremity identified to have swelling of cellulitic infection-distal left tib-fib. Appears to be healing well. Negative swelling. Negative red streaks identified. Mild warmth upon palpation when compared to the right lower extremity. Negative active drainage or bleeding. Negative weeping. Negative ulcers identified.  Psychiatric: She has a normal mood and affect. Her behavior is normal. Thought content normal.    ED Course  Procedures (including critical care time)  Results for orders placed during the hospital encounter of 04/25/14  CBC WITH DIFFERENTIAL      Result Value Ref Range   WBC 10.5  4.0 - 10.5 K/uL   RBC  4.34  3.87 - 5.11 MIL/uL   Hemoglobin 13.9  12.0 - 15.0 g/dL   HCT 41.0  36.0 - 46.0 %   MCV 94.5  78.0 - 100.0 fL   MCH 32.0  26.0 - 34.0 pg   MCHC 33.9  30.0 - 36.0 g/dL   RDW 12.9  11.5 - 15.5 %   Platelets 138 (*) 150 - 400 K/uL   Neutrophils Relative % 85 (*) 43 - 77 %   Neutro Abs 8.9 (*) 1.7 - 7.7 K/uL   Lymphocytes Relative 7 (*) 12 - 46 %   Lymphs Abs 0.7  0.7 - 4.0 K/uL   Monocytes Relative 8  3 - 12 %   Monocytes Absolute 0.8  0.1 - 1.0 K/uL   Eosinophils Relative 0  0 - 5 %   Eosinophils Absolute 0.0  0.0 - 0.7 K/uL   Basophils Relative 0  0 - 1 %   Basophils Absolute 0.0  0.0 - 0.1 K/uL  COMPREHENSIVE METABOLIC PANEL      Result Value Ref Range   Sodium 136 (*) 137 - 147 mEq/L   Potassium 3.9  3.7 - 5.3 mEq/L   Chloride 100  96 - 112 mEq/L   CO2 20  19 - 32 mEq/L   Glucose, Bld 171 (*) 70 - 99 mg/dL   BUN 28 (*) 6 - 23 mg/dL   Creatinine, Ser 1.45 (*) 0.50 - 1.10 mg/dL   Calcium 10.6 (*) 8.4 - 10.5 mg/dL   Total Protein 7.2  6.0 - 8.3 g/dL   Albumin 3.1 (*) 3.5 - 5.2 g/dL   AST 61 (*) 0 - 37 U/L   ALT 35  0 - 35 U/L   Alkaline Phosphatase 145 (*) 39 - 117 U/L   Total Bilirubin 0.7  0.3 - 1.2 mg/dL   GFR calc non Af Amer 32 (*) >90 mL/min   GFR calc Af Amer 37 (*) >90 mL/min   Anion gap 16 (*) 5 - 15  URINALYSIS, ROUTINE W REFLEX MICROSCOPIC      Result Value Ref Range   Color, Urine AMBER (*) YELLOW   APPearance CLOUDY (*) CLEAR   Specific Gravity, Urine 1.026  1.005 - 1.030   pH 5.0  5.0 - 8.0   Glucose, UA NEGATIVE  NEGATIVE mg/dL   Hgb urine dipstick NEGATIVE  NEGATIVE   Bilirubin Urine NEGATIVE  NEGATIVE   Ketones, ur NEGATIVE  NEGATIVE mg/dL   Protein, ur NEGATIVE  NEGATIVE mg/dL   Urobilinogen, UA 0.2  0.0 - 1.0 mg/dL   Nitrite NEGATIVE  NEGATIVE   Leukocytes, UA TRACE (*) NEGATIVE  LIPASE, BLOOD      Result Value Ref Range   Lipase 53  11 - 59 U/L  TROPONIN I      Result Value Ref Range   Troponin I <0.30  <0.30 ng/mL  URINE MICROSCOPIC-ADD  ON      Result Value Ref Range   Squamous Epithelial / LPF FEW (*) RARE   WBC, UA 3-6  <3 WBC/hpf   Bacteria, UA MANY (*) RARE   Casts HYALINE CASTS (*) NEGATIVE   Urine-Other MUCOUS PRESENT    I-STAT CG4 LACTIC ACID, ED      Result Value Ref Range   Lactic Acid, Venous 3.99 (*) 0.5 - 2.2 mmol/L  POC OCCULT BLOOD, ED      Result Value Ref Range   Fecal Occult Bld NEGATIVE  NEGATIVE    Labs Review Labs Reviewed  CBC WITH DIFFERENTIAL - Abnormal; Notable for the following:    Platelets 138 (*)    Neutrophils Relative % 85 (*)    Neutro Abs 8.9 (*)    Lymphocytes Relative 7 (*)    All other components within normal limits  COMPREHENSIVE METABOLIC PANEL - Abnormal; Notable for the following:    Sodium 136 (*)    Glucose, Bld 171 (*)    BUN 28 (*)    Creatinine, Ser 1.45 (*)    Calcium 10.6 (*)    Albumin 3.1 (*)    AST 61 (*)    Alkaline Phosphatase 145 (*)    GFR calc non Af Amer 32 (*)    GFR calc Af Amer 37 (*)    Anion gap 16 (*)    All other components within normal limits  URINALYSIS, ROUTINE  W REFLEX MICROSCOPIC - Abnormal; Notable for the following:    Color, Urine AMBER (*)    APPearance CLOUDY (*)    Leukocytes, UA TRACE (*)    All other components within normal limits  URINE MICROSCOPIC-ADD ON - Abnormal; Notable for the following:    Squamous Epithelial / LPF FEW (*)    Bacteria, UA MANY (*)    Casts HYALINE CASTS (*)    All other components within normal limits  I-STAT CG4 LACTIC ACID, ED - Abnormal; Notable for the following:    Lactic Acid, Venous 3.99 (*)    All other components within normal limits  CULTURE, BLOOD (ROUTINE X 2)  CULTURE, BLOOD (ROUTINE X 2)  STOOL CULTURE  URINE CULTURE  LIPASE, BLOOD  TROPONIN I  OCCULT BLOOD X 1 CARD TO LAB, STOOL  POC OCCULT BLOOD, ED    Imaging Review Dg Chest 2 View  04/25/2014   CLINICAL DATA:  Fall, weakness, low back pain  EXAM: CHEST  2 VIEW  COMPARISON:  04/25/2014  FINDINGS: Mild cardiomegaly noted  with central vascular congestion. Diffusely prominent interstitial reticular opacities are noted. No focal pulmonary opacity. No pleural effusion. No acute osseous abnormality.  IMPRESSION: No acute cardiopulmonary process. Cardiomegaly with mild central vascular congestion reidentified.   Electronically Signed   By: Conchita Paris M.D.   On: 04/25/2014 18:49   Dg Lumbar Spine Complete  04/25/2014   CLINICAL DATA:  Fall, low back pain  EXAM: LUMBAR SPINE - COMPLETE 4+ VIEW  COMPARISON:  None.  FINDINGS: There is no evidence of lumbar spine fracture. Alignment is normal. Multilevel disc degenerative change noted most prominent the lower thoracic spine. Vertebral body heights are maintained. Atheromatous aortic calcification noted with mild infrarenal ectasia measuring 2.2 cm in its visualized calcified portion.  IMPRESSION: No acute osseous abnormality of the lumbar spine   Electronically Signed   By: Conchita Paris M.D.   On: 04/25/2014 18:44   Dg Pelvis 1-2 Views  04/25/2014   CLINICAL DATA:  Fall, low back pain  EXAM: PELVIS - 1-2 VIEW  COMPARISON:  None.  FINDINGS: There is no evidence of pelvic fracture or diastasis. No other pelvic bone lesions are seen. Lower lumbar spine degenerative change noted.  IMPRESSION: Negative.   Electronically Signed   By: Conchita Paris M.D.   On: 04/25/2014 18:48   Ct Head Wo Contrast  04/25/2014   CLINICAL DATA:  Fall, fever.  EXAM: CT HEAD WITHOUT CONTRAST  CT CERVICAL SPINE WITHOUT CONTRAST  TECHNIQUE: Multidetector CT imaging of the head and cervical spine was performed following the standard protocol without intravenous contrast. Multiplanar CT image reconstructions of the cervical spine were also generated.  COMPARISON:  CT of the head September 04, 2013.  FINDINGS: CT HEAD FINDINGS  The ventricles and sulci are normal for age. No intraparenchymal hemorrhage, mass effect nor midline shift. Patchy supratentorial white matter hypodensities are less than expected for  patient's age and though non-specific suggest sequelae of chronic small vessel ischemic disease. No acute large vascular territory infarcts.  No abnormal extra-axial fluid collections. Basal cisterns are patent. Mild calcific atherosclerosis of the carotid siphons.  No skull fracture. The included ocular globes and orbital contents are non-suspicious. Status post bilateral ocular lens implants. The mastoid aircells and included paranasal sinuses are well-aerated. Moderate temporomandibular osteoarthrosis.  CT CERVICAL SPINE FINDINGS  Cervical vertebral bodies and posterior elements are intact. Grade 1 C5-6 anterolisthesis without spondylolysis. Straightened cervical lordosis. Severe C6-7, moderate to  severe C7-T1 disc degeneration. C1-2 articulation maintained with severe arthropathy. Bone mineral density is decreased without destructive bony lesions.  Biapical pleural thickening and scarring. Prevertebral soft tissues are nonsuspicious.  Disc degeneration and facet arthropathy result in mild canal stenosis at C6-7. Severe left C3-4 and C5-6 neural foraminal narrowing, moderate to severe on the right at C4-5 and bilaterally at C6-7.  IMPRESSION: CT head: No acute intracranial process. Normal noncontrast CT of the head for age.  CT cervical spine: Straightened cervical lordosis without acute fracture. Grade 1 C5-6 anterolisthesis on degenerative basis.   Electronically Signed   By: Elon Alas   On: 04/25/2014 18:44   Ct Cervical Spine Wo Contrast  04/25/2014   CLINICAL DATA:  Fall, fever.  EXAM: CT HEAD WITHOUT CONTRAST  CT CERVICAL SPINE WITHOUT CONTRAST  TECHNIQUE: Multidetector CT imaging of the head and cervical spine was performed following the standard protocol without intravenous contrast. Multiplanar CT image reconstructions of the cervical spine were also generated.  COMPARISON:  CT of the head September 04, 2013.  FINDINGS: CT HEAD FINDINGS  The ventricles and sulci are normal for age. No  intraparenchymal hemorrhage, mass effect nor midline shift. Patchy supratentorial white matter hypodensities are less than expected for patient's age and though non-specific suggest sequelae of chronic small vessel ischemic disease. No acute large vascular territory infarcts.  No abnormal extra-axial fluid collections. Basal cisterns are patent. Mild calcific atherosclerosis of the carotid siphons.  No skull fracture. The included ocular globes and orbital contents are non-suspicious. Status post bilateral ocular lens implants. The mastoid aircells and included paranasal sinuses are well-aerated. Moderate temporomandibular osteoarthrosis.  CT CERVICAL SPINE FINDINGS  Cervical vertebral bodies and posterior elements are intact. Grade 1 C5-6 anterolisthesis without spondylolysis. Straightened cervical lordosis. Severe C6-7, moderate to severe C7-T1 disc degeneration. C1-2 articulation maintained with severe arthropathy. Bone mineral density is decreased without destructive bony lesions.  Biapical pleural thickening and scarring. Prevertebral soft tissues are nonsuspicious.  Disc degeneration and facet arthropathy result in mild canal stenosis at C6-7. Severe left C3-4 and C5-6 neural foraminal narrowing, moderate to severe on the right at C4-5 and bilaterally at C6-7.  IMPRESSION: CT head: No acute intracranial process. Normal noncontrast CT of the head for age.  CT cervical spine: Straightened cervical lordosis without acute fracture. Grade 1 C5-6 anterolisthesis on degenerative basis.   Electronically Signed   By: Elon Alas   On: 04/25/2014 18:44   Dg Chest Port 1 View  04/25/2014   CLINICAL DATA:  Fever  EXAM: PORTABLE CHEST - 1 VIEW  COMPARISON:  04/17/2014  FINDINGS: Cardiac leads obscure detail. Borderline cardiomegaly is noted. Lungs are hypoaerated with crowding of the bronchovascular markings. Trace pleural effusions persist. Retrocardiac opacification is identified.  IMPRESSION: Retrocardiac  opacity which could represent atelectasis given the degree of hypoaeration although pneumonia could appear similar. If symptoms persist, consider PA and lateral chest radiographs obtained at full inspiration when the patient is clinically able.   Electronically Signed   By: Conchita Paris M.D.   On: 04/25/2014 17:48     EKG Interpretation   Date/Time:  Monday April 25 2014 16:13:19 EDT Ventricular Rate:  82 PR Interval:  177 QRS Duration: 114 QT Interval:  383 QTC Calculation: 447 R Axis:   -45 Text Interpretation:  Sinus arrhythmia Left anterior fascicular block  Abnormal R-wave progression, late transition Probable left ventricular  hypertrophy since last tracing no significant change Confirmed by MILLER   MD, BRIAN ([id number]) on 04/25/2014  5:33:25 PM      MDM   Final diagnoses:  None    Medications  sodium chloride 0.9 % bolus 1,000 mL (1,000 mLs Intravenous New Bag/Given 04/25/14 1730)  ibuprofen (ADVIL,MOTRIN) tablet 400 mg (400 mg Oral Given 04/25/14 1924)   Filed Vitals:   04/25/14 1800 04/25/14 1900 04/25/14 1905 04/25/14 1925  BP: 112/51 134/59 134/59   Pulse: 89 87 87   Temp:    98.6 F (37 C)  TempSrc:    Oral  Resp: 24 22 26    SpO2: 96% 97% 97%    This provider reviewed patient's chart. Patient was seen and assessed in ED setting on 04/17/2014 regarding cellulitic infection identified left lower extremity. Patient was admitted to the hospital-hospital course identified the patient is getting vancomycin. Patient was discharged home on 04/20/2014 with Keflex. EKG noted sinus arrhythmia with a heart rate of 82 beats per minute-left anterior fascicular block noted. Negative changes identified when compared to last tracing. Troponin negative elevation. CBC negative elevated white blood cell count-negative left shift or leukocytosis. Mildly elevated neutrophils of 8.9. CMP noted low sodium of 136. Mild bump in BUN of 28, Creatinine 1.45. Elevated calcium of 10.6. Glucose 171.  Anion gap 16.0 mEq per liter. Mild elevation of alkaline phosphatase of 145, 1 compared to couple days ago alkaline phosphatase was 114. Lipase negative elevation. Lactic acid elevated at 3.99. Fecal occult negative. Urinalysis noted trace of leukocytes with very moderate white blood cell count is 3-C6, many bacteria tonsils identified-appears to be not a clean catch. Chest x-ray noted retrocardiac opacity which represent atelectasis versus possible pneumonia. Lumbar spine plain film negative for acute osseous injury. Pelvis plain film negative for acute osseous injury. Chest x-ray negative for acute cardiac pulmonary processes, cardiomegaly with mild central vascular congestion and reidentified. CT head no acute intracranial process, normal noncontrast CT of the head. CT cervical spine noted straightened cervical lordosis without acute fracture. Grade 1 C5-C6 anterolisthesis on degenerative basis. Patient appears to be dehydrated with elevated lactic acid 3.99. Patient started on fluid swelling in ED setting. Patient given ibuprofen for low-grade fever. Imaging unremarkable. Negative source of infection. Cannot rule out possible cellulitis resulting in this infection. Patient seen and assessed by attending physician who agreed to plan of admission. Discussed case with [facility] hospitalist-prior hospitals to admit for fever and dehydration. Discussed plan for admission with family and patient agreed to plan of care. Patient stable for transfer.  Jemmie Ledgerwood, PA-C 04/26/14 0300

## 2014-04-25 NOTE — Progress Notes (Signed)
  CARE MANAGEMENT ED NOTE 04/25/2014  Patient:  [name], [name]   Account Number:  [id number]  Date Initiated:  04/25/2014  Documentation initiated by:  Jackelyn Poling  Subjective/Objective Assessment:   80 yr old medicare pt coming from home with c/o fever since yesterday and fall. Pt reports she was walking out of bathroom and felt weak and fell. Denies LOC, didn't hit her head,, denies neck/back pain.     Subjective/Objective Assessment Detail:   Pt and daughter confirmed pt seen by [facility] previously and if needed after d/c this would be pt choice of home health agency  Pt confirms she has a walker at home  Recent d/c from [facility] on 04/20/14     Action/Plan:   Spoke wtih pt and daughter about cm consult to watch for home health needs [place] to be contacted to follow pt for any d/c needs   Action/Plan Detail:   Anticipated DC Date:  04/26/2014     Status Recommendation to Physician:   Result of Recommendation:    Other ED [place]  Other  PCP issues  Outpatient Services - Pt will follow up    Choice offered to / List presented to:            Status of service:  Completed, signed off  ED Comments:   ED Comments Detail:

## 2014-04-25 NOTE — H&P (Addendum)
[facility] History and Physical  [name] VVO:[id number] DOB: Nov 20, 1927 DOA: 04/25/2014  Referring physician: EDP PCP: Alesia Richards, MD   Chief Complaint: weakness and fall.   HPI: [name] is a 80 y.o. female with prior h/o hypertension, diet controlled DM, recently admitted for cellulitis of the left leg and discharged on the 7/29, brought in by her daughter after she found her on the bathroom floor. As per the patient, she felt  Weak, slightly went to the bathroom, and the next thing she knew she was on the floor. She also reports loose bowel  Movements since 2 days, associated with nausea, no vomiting. She denies any fever or chills. No family at bedside. She is a poor historian. On arrival to ED, she was found to be in acute renal failure and dehydrated. She denies any loss of consciousness when she fell. Initial imaging of the head and pelvis did not reveal any acute process. CXR showed some mild vascular congestion. Her cellulitis appears to be improving and the pain is getting better. Se is referred to [facility] service for admission.   Review of Systems: Limited, see HPI for details.   Past Medical History  Diagnosis Date  . Anemia, iron deficiency 08/07/2011  . Angiodysplasia of intestinal tract 08/07/2011  . Hypertension   . Arthritis   . Diverticulitis   . Phlebitis   . Macular degeneration   . TIA (transient ischemic attack)   . MI (myocardial infarction)   . Hiatal hernia   . AVM (arteriovenous malformation) of colon with hemorrhage   . Varicose veins of legs   . Cataract   . GERD (gastroesophageal reflux disease)   . DJD (degenerative joint disease)   . Diabetes mellitus     borderline  . History of blood transfusion   . PONV (postoperative nausea and vomiting)   . hepatocellar ca dx'd 08/2012  . Edema leg   . Fatty liver   . Vitamin D deficiency    Past Surgical History  Procedure Laterality Date  . Carotid artery - subclavian artery bypass  graft    . Abdominal hysterectomy    . Hemorrhoid surgery    . Esophagogastroduodenoscopy  08/30/2012    Procedure: ESOPHAGOGASTRODUODENOSCOPY (EGD);  Surgeon: Jeryl Columbia, MD;  Location: [facility];  Service: Endoscopy;  Laterality: N/A;  . Radiofrequency ablation liver tumor Right 01/2013    HFA ablation right liver lesion  . Colonoscopy  06/2009    Dr. Watt Climes, due 5 years  . Endoscopic vein laser treatment Left 2014    Dr. Kellie Simmering   Social History:  reports that she quit smoking about 23 years ago. Her smoking use included Cigarettes. She started smoking about 63 years ago. She has a 200 pack-year smoking history. She has never used smokeless tobacco. She reports that she does not drink alcohol or use illicit drugs.  Allergies  Allergen Reactions  . Avelox [Moxifloxacin Hcl In Nacl] Anaphylaxis  . Statins Other (See Comments)    Liver function elevations  . Sulfa Antibiotics Hives and Itching  . Ace Inhibitors     Unknown  . Doxycycline     Unknown  . Infed [Iron Dextran] Other (See Comments)    IV IRON, Unknown  . Infergen [Interferon Alfacon-1]   . Vancomycin Other (See Comments)    Flushing, erythema to neck and face, tolerated vanc at lower infusion rate  . Ciprofloxacin Other (See Comments)    Complains of sore mouth  . Fenofibrate  Other (See Comments)    constipation    Family History  Problem Relation Age of Onset  . Colon cancer Sister   . Coronary artery disease Father   . Heart disease Father      Prior to Admission medications   Medication Sig Start Date End Date Taking? Authorizing Provider  ALPRAZolam Duanne Moron) 1 MG tablet Take 0.5 tablets (0.5 mg total) by mouth at bedtime as needed for sleep or anxiety. 10/26/13  Yes Vicie Mutters, PA-C  atenolol (TENORMIN) 50 MG tablet Take 1 tablet (50 mg total) by mouth daily. 01/24/14  Yes Unk Pinto, MD  cephALEXin (KEFLEX) 500 MG capsule Take 1 capsule (500 mg total) by mouth 3 (three) times daily. 04/20/14  Yes  Shanker Kristeen Mans, MD  cholecalciferol (VITAMIN D) 1000 UNITS tablet Take 1,000 Units by mouth daily.   Yes Historical Provider, MD  cyclobenzaprine (FLEXERIL) 10 MG tablet Take 10 mg by mouth 2 (two) times daily as needed for muscle spasms.    Yes Marybelle Killings, MD  furosemide (LASIX) 40 MG tablet Take 1 tablet (40 mg total) by mouth daily. 04/20/14  Yes Shanker Kristeen Mans, MD  lactose free nutrition (BOOST PLUS) LIQD Take 237 mLs by mouth 2 (two) times daily between meals. 04/20/14  Yes Shanker Kristeen Mans, MD  NITROSTAT 0.4 MG SL tablet Place 0.4 mg under the tongue every 5 (five) minutes as needed for chest pain.  07/05/11  Yes Historical Provider, MD  omeprazole (PRILOSEC) 40 MG capsule Take 40 mg by mouth daily as needed (for reflux).    Yes Historical Provider, MD  Pancrelipase, Lip-Prot-Amyl, (CREON) 24000 UNITS CPEP Take 1 capsule (24,000 Units total) by mouth 3 (three) times daily before meals. 04/13/14  Yes Volanda Napoleon, MD  Probiotic Product (PROBIOTIC DAILY PO) Take 1 tablet by mouth every morning.    Yes Historical Provider, MD  spironolactone (ALDACTONE) 50 MG tablet Take 50 mg by mouth every morning.   Yes Historical Provider, MD  triamcinolone cream (KENALOG) 0.1 % Apply 1 application topically 2 (two) times daily as needed. Eczema 07/09/12  Yes Historical Provider, MD   Physical Exam: Filed Vitals:   04/25/14 1905 04/25/14 1925 04/25/14 1930 04/25/14 2014  BP: 134/59  133/52 112/66  Pulse: 87  85 84  Temp:  98.6 F (37 C)  97.6 F (36.4 C)  TempSrc:  Oral  Oral  Resp: 26  30 22   Height:    5\' 6"  (1.676 m)  Weight:    77.792 kg (171 lb 8 oz)  SpO2: 97%  97% 98%    Wt Readings from Last 3 Encounters:  04/25/14 77.792 kg (171 lb 8 oz)  04/17/14 79.84 kg (176 lb 0.2 oz)  04/13/14 79.833 kg (176 lb)    General:  Appears calm and comfortable Eyes: PERRL, normal lids, irises & conjunctiva Neck: no LAD, masses or thyromegaly Cardiovascular: RRR, no m/r/g. No LE  edema. Respiratory: CTA bilaterally, no w/r/r. Normal respiratory effort. Abdomen: soft, ntnd Skin: no rash or induration seen on limited exam Musculoskeletal: chronic venous stasis changes, varicose veins and cellulitis changes on the left leg improving.  Neurologic: SPEECH normal, she is oriented to place and person only.  able to move all extremities.           Labs on Admission:  Basic Metabolic Panel:  Recent Labs Lab 04/25/14 1706  NA 136*  K 3.9  CL 100  CO2 20  GLUCOSE 171*  BUN 28*  CREATININE 1.45*  CALCIUM 10.6*   Liver Function Tests:  Recent Labs Lab 04/25/14 1706  AST 61*  ALT 35  ALKPHOS 145*  BILITOT 0.7  PROT 7.2  ALBUMIN 3.1*    Recent Labs Lab 04/25/14 1706  LIPASE 53   No results found for this basename: AMMONIA,  in the last 168 hours CBC:  Recent Labs Lab 04/25/14 1706  WBC 10.5  NEUTROABS 8.9*  HGB 13.9  HCT 41.0  MCV 94.5  PLT 138*   Cardiac Enzymes:  Recent Labs Lab 04/25/14 1706  TROPONINI <0.30    BNP (last 3 results) No results found for this basename: PROBNP,  in the last 8760 hours CBG: No results found for this basename: GLUCAP,  in the last 168 hours  Radiological Exams on Admission: Dg Chest 2 View  04/25/2014   CLINICAL DATA:  Fall, weakness, low back pain  EXAM: CHEST  2 VIEW  COMPARISON:  04/25/2014  FINDINGS: Mild cardiomegaly noted with central vascular congestion. Diffusely prominent interstitial reticular opacities are noted. No focal pulmonary opacity. No pleural effusion. No acute osseous abnormality.  IMPRESSION: No acute cardiopulmonary process. Cardiomegaly with mild central vascular congestion reidentified.   Electronically Signed   By: Conchita Paris M.D.   On: 04/25/2014 18:49   Dg Lumbar Spine Complete  04/25/2014   CLINICAL DATA:  Fall, low back pain  EXAM: LUMBAR SPINE - COMPLETE 4+ VIEW  COMPARISON:  None.  FINDINGS: There is no evidence of lumbar spine fracture. Alignment is normal. Multilevel  disc degenerative change noted most prominent the lower thoracic spine. Vertebral body heights are maintained. Atheromatous aortic calcification noted with mild infrarenal ectasia measuring 2.2 cm in its visualized calcified portion.  IMPRESSION: No acute osseous abnormality of the lumbar spine   Electronically Signed   By: Conchita Paris M.D.   On: 04/25/2014 18:44   Dg Pelvis 1-2 Views  04/25/2014   CLINICAL DATA:  Fall, low back pain  EXAM: PELVIS - 1-2 VIEW  COMPARISON:  None.  FINDINGS: There is no evidence of pelvic fracture or diastasis. No other pelvic bone lesions are seen. Lower lumbar spine degenerative change noted.  IMPRESSION: Negative.   Electronically Signed   By: Conchita Paris M.D.   On: 04/25/2014 18:48   Ct Head Wo Contrast  04/25/2014   CLINICAL DATA:  Fall, fever.  EXAM: CT HEAD WITHOUT CONTRAST  CT CERVICAL SPINE WITHOUT CONTRAST  TECHNIQUE: Multidetector CT imaging of the head and cervical spine was performed following the standard protocol without intravenous contrast. Multiplanar CT image reconstructions of the cervical spine were also generated.  COMPARISON:  CT of the head September 04, 2013.  FINDINGS: CT HEAD FINDINGS  The ventricles and sulci are normal for age. No intraparenchymal hemorrhage, mass effect nor midline shift. Patchy supratentorial white matter hypodensities are less than expected for patient's age and though non-specific suggest sequelae of chronic small vessel ischemic disease. No acute large vascular territory infarcts.  No abnormal extra-axial fluid collections. Basal cisterns are patent. Mild calcific atherosclerosis of the carotid siphons.  No skull fracture. The included ocular globes and orbital contents are non-suspicious. Status post bilateral ocular lens implants. The mastoid aircells and included paranasal sinuses are well-aerated. Moderate temporomandibular osteoarthrosis.  CT CERVICAL SPINE FINDINGS  Cervical vertebral bodies and posterior elements are  intact. Grade 1 C5-6 anterolisthesis without spondylolysis. Straightened cervical lordosis. Severe C6-7, moderate to severe C7-T1 disc degeneration. C1-2 articulation maintained with severe arthropathy. Bone mineral density is decreased  without destructive bony lesions.  Biapical pleural thickening and scarring. Prevertebral soft tissues are nonsuspicious.  Disc degeneration and facet arthropathy result in mild canal stenosis at C6-7. Severe left C3-4 and C5-6 neural foraminal narrowing, moderate to severe on the right at C4-5 and bilaterally at C6-7.  IMPRESSION: CT head: No acute intracranial process. Normal noncontrast CT of the head for age.  CT cervical spine: Straightened cervical lordosis without acute fracture. Grade 1 C5-6 anterolisthesis on degenerative basis.   Electronically Signed   By: Elon Alas   On: 04/25/2014 18:44   Ct Cervical Spine Wo Contrast  04/25/2014   CLINICAL DATA:  Fall, fever.  EXAM: CT HEAD WITHOUT CONTRAST  CT CERVICAL SPINE WITHOUT CONTRAST  TECHNIQUE: Multidetector CT imaging of the head and cervical spine was performed following the standard protocol without intravenous contrast. Multiplanar CT image reconstructions of the cervical spine were also generated.  COMPARISON:  CT of the head September 04, 2013.  FINDINGS: CT HEAD FINDINGS  The ventricles and sulci are normal for age. No intraparenchymal hemorrhage, mass effect nor midline shift. Patchy supratentorial white matter hypodensities are less than expected for patient's age and though non-specific suggest sequelae of chronic small vessel ischemic disease. No acute large vascular territory infarcts.  No abnormal extra-axial fluid collections. Basal cisterns are patent. Mild calcific atherosclerosis of the carotid siphons.  No skull fracture. The included ocular globes and orbital contents are non-suspicious. Status post bilateral ocular lens implants. The mastoid aircells and included paranasal sinuses are well-aerated.  Moderate temporomandibular osteoarthrosis.  CT CERVICAL SPINE FINDINGS  Cervical vertebral bodies and posterior elements are intact. Grade 1 C5-6 anterolisthesis without spondylolysis. Straightened cervical lordosis. Severe C6-7, moderate to severe C7-T1 disc degeneration. C1-2 articulation maintained with severe arthropathy. Bone mineral density is decreased without destructive bony lesions.  Biapical pleural thickening and scarring. Prevertebral soft tissues are nonsuspicious.  Disc degeneration and facet arthropathy result in mild canal stenosis at C6-7. Severe left C3-4 and C5-6 neural foraminal narrowing, moderate to severe on the right at C4-5 and bilaterally at C6-7.  IMPRESSION: CT head: No acute intracranial process. Normal noncontrast CT of the head for age.  CT cervical spine: Straightened cervical lordosis without acute fracture. Grade 1 C5-6 anterolisthesis on degenerative basis.   Electronically Signed   By: Elon Alas   On: 04/25/2014 18:44   Dg Chest Port 1 View  04/25/2014   CLINICAL DATA:  Fever  EXAM: PORTABLE CHEST - 1 VIEW  COMPARISON:  04/17/2014  FINDINGS: Cardiac leads obscure detail. Borderline cardiomegaly is noted. Lungs are hypoaerated with crowding of the bronchovascular markings. Trace pleural effusions persist. Retrocardiac opacification is identified.  IMPRESSION: Retrocardiac opacity which could represent atelectasis given the degree of hypoaeration although pneumonia could appear similar. If symptoms persist, consider PA and lateral chest radiographs obtained at full inspiration when the patient is clinically able.   Electronically Signed   By: Conchita Paris M.D.   On: 04/25/2014 17:48    EHM:CNOBS arrythmia at 82/min  Assessment/Plan Active Problems:   Dehydration   Acute renal failure  Acute renal failure: Admitted to med surg. Probably secondary to dehdyration from ongoing diarrhea. Gentle hydration and repeat renal parameters in am. UA showed trace  leukocytes and many bacteria. Urine cultures are added on.  Orthostatic vital signs.   Fall:  ? Orthostatic. No hypotension on arrival.  Initial imaging negative for acute process.  Get orthostatic vital signs.  PT evaluation.     Diarrhea; Sent stool  for Gi pathogen.  Continue to monitor.   Elevated lactic acid  Probably related to dehydration. Repeat level in am.   Cellulitis of the left lower extremity: She has completed the 5 days of antibiotics. Will hold off her antibiotics for now.   H/o hepatoma: Outpatient follow up with oncology.    Hepatic cirrhosis: On lasix and aldactone, which are being held for acute renal failure.   DVT prophylaxis.       Code Status: presumed full code.  DVT Prophylaxis: Family Communication: none at bedside Disposition Plan: admit to medsurg.   Time spent: 65 min  [facility] Pager [phone]  **Disclaimer: This note may have been dictated with voice recognition software. Similar sounding words can inadvertently be transcribed and this note may contain transcription errors which may not have been corrected upon publication of note.**

## 2014-04-25 NOTE — ED Notes (Signed)
[place], PA made aware of patient CG4 Lactic results.

## 2014-04-26 ENCOUNTER — Ambulatory Visit: Payer: Self-pay | Admitting: Internal Medicine

## 2014-04-26 DIAGNOSIS — D509 Iron deficiency anemia, unspecified: Secondary | ICD-10-CM

## 2014-04-26 DIAGNOSIS — W19XXXA Unspecified fall, initial encounter: Secondary | ICD-10-CM | POA: Diagnosis present

## 2014-04-26 DIAGNOSIS — E43 Unspecified severe protein-calorie malnutrition: Secondary | ICD-10-CM | POA: Diagnosis present

## 2014-04-26 DIAGNOSIS — R188 Other ascites: Secondary | ICD-10-CM | POA: Diagnosis present

## 2014-04-26 DIAGNOSIS — K449 Diaphragmatic hernia without obstruction or gangrene: Secondary | ICD-10-CM | POA: Diagnosis present

## 2014-04-26 DIAGNOSIS — R599 Enlarged lymph nodes, unspecified: Secondary | ICD-10-CM | POA: Diagnosis present

## 2014-04-26 DIAGNOSIS — Z8249 Family history of ischemic heart disease and other diseases of the circulatory system: Secondary | ICD-10-CM | POA: Diagnosis not present

## 2014-04-26 DIAGNOSIS — J9600 Acute respiratory failure, unspecified whether with hypoxia or hypercapnia: Secondary | ICD-10-CM | POA: Diagnosis not present

## 2014-04-26 DIAGNOSIS — I872 Venous insufficiency (chronic) (peripheral): Secondary | ICD-10-CM | POA: Diagnosis present

## 2014-04-26 DIAGNOSIS — T502X5A Adverse effect of carbonic-anhydrase inhibitors, benzothiadiazides and other diuretics, initial encounter: Secondary | ICD-10-CM | POA: Diagnosis not present

## 2014-04-26 DIAGNOSIS — K7689 Other specified diseases of liver: Secondary | ICD-10-CM | POA: Diagnosis present

## 2014-04-26 DIAGNOSIS — Z881 Allergy status to other antibiotic agents status: Secondary | ICD-10-CM | POA: Diagnosis not present

## 2014-04-26 DIAGNOSIS — N179 Acute kidney failure, unspecified: Secondary | ICD-10-CM | POA: Diagnosis present

## 2014-04-26 DIAGNOSIS — K766 Portal hypertension: Secondary | ICD-10-CM | POA: Diagnosis present

## 2014-04-26 DIAGNOSIS — H3530 Unspecified macular degeneration: Secondary | ICD-10-CM | POA: Diagnosis present

## 2014-04-26 DIAGNOSIS — Y92009 Unspecified place in unspecified non-institutional (private) residence as the place of occurrence of the external cause: Secondary | ICD-10-CM | POA: Diagnosis not present

## 2014-04-26 DIAGNOSIS — I252 Old myocardial infarction: Secondary | ICD-10-CM | POA: Diagnosis not present

## 2014-04-26 DIAGNOSIS — T7589XS Other specified effects of external causes, sequela: Secondary | ICD-10-CM

## 2014-04-26 DIAGNOSIS — R5383 Other fatigue: Secondary | ICD-10-CM | POA: Diagnosis present

## 2014-04-26 DIAGNOSIS — W19XXXS Unspecified fall, sequela: Secondary | ICD-10-CM

## 2014-04-26 DIAGNOSIS — E872 Acidosis, unspecified: Secondary | ICD-10-CM | POA: Diagnosis present

## 2014-04-26 DIAGNOSIS — L02419 Cutaneous abscess of limb, unspecified: Secondary | ICD-10-CM | POA: Diagnosis present

## 2014-04-26 DIAGNOSIS — Z8673 Personal history of transient ischemic attack (TIA), and cerebral infarction without residual deficits: Secondary | ICD-10-CM | POA: Diagnosis not present

## 2014-04-26 DIAGNOSIS — Z8672 Personal history of thrombophlebitis: Secondary | ICD-10-CM | POA: Diagnosis not present

## 2014-04-26 DIAGNOSIS — E875 Hyperkalemia: Secondary | ICD-10-CM | POA: Diagnosis not present

## 2014-04-26 DIAGNOSIS — Z79899 Other long term (current) drug therapy: Secondary | ICD-10-CM | POA: Diagnosis not present

## 2014-04-26 DIAGNOSIS — Z8505 Personal history of malignant neoplasm of liver: Secondary | ICD-10-CM | POA: Diagnosis not present

## 2014-04-26 DIAGNOSIS — E871 Hypo-osmolality and hyponatremia: Secondary | ICD-10-CM | POA: Diagnosis present

## 2014-04-26 DIAGNOSIS — K7460 Unspecified cirrhosis of liver: Secondary | ICD-10-CM | POA: Diagnosis present

## 2014-04-26 DIAGNOSIS — J90 Pleural effusion, not elsewhere classified: Secondary | ICD-10-CM | POA: Diagnosis present

## 2014-04-26 DIAGNOSIS — E119 Type 2 diabetes mellitus without complications: Secondary | ICD-10-CM | POA: Diagnosis present

## 2014-04-26 DIAGNOSIS — Z800 Family history of malignant neoplasm of digestive organs: Secondary | ICD-10-CM | POA: Diagnosis not present

## 2014-04-26 DIAGNOSIS — B370 Candidal stomatitis: Secondary | ICD-10-CM | POA: Diagnosis present

## 2014-04-26 DIAGNOSIS — R5381 Other malaise: Secondary | ICD-10-CM | POA: Diagnosis present

## 2014-04-26 DIAGNOSIS — K219 Gastro-esophageal reflux disease without esophagitis: Secondary | ICD-10-CM | POA: Diagnosis present

## 2014-04-26 DIAGNOSIS — Z87891 Personal history of nicotine dependence: Secondary | ICD-10-CM | POA: Diagnosis not present

## 2014-04-26 DIAGNOSIS — E876 Hypokalemia: Secondary | ICD-10-CM | POA: Diagnosis present

## 2014-04-26 DIAGNOSIS — D696 Thrombocytopenia, unspecified: Secondary | ICD-10-CM | POA: Diagnosis present

## 2014-04-26 DIAGNOSIS — E559 Vitamin D deficiency, unspecified: Secondary | ICD-10-CM | POA: Diagnosis present

## 2014-04-26 DIAGNOSIS — K738 Other chronic hepatitis, not elsewhere classified: Secondary | ICD-10-CM | POA: Diagnosis present

## 2014-04-26 DIAGNOSIS — Z6827 Body mass index (BMI) 27.0-27.9, adult: Secondary | ICD-10-CM | POA: Diagnosis not present

## 2014-04-26 DIAGNOSIS — I10 Essential (primary) hypertension: Secondary | ICD-10-CM | POA: Diagnosis present

## 2014-04-26 DIAGNOSIS — A0472 Enterocolitis due to Clostridium difficile, not specified as recurrent: Secondary | ICD-10-CM | POA: Diagnosis present

## 2014-04-26 DIAGNOSIS — L089 Local infection of the skin and subcutaneous tissue, unspecified: Secondary | ICD-10-CM | POA: Diagnosis present

## 2014-04-26 LAB — GI PATHOGEN PANEL BY PCR, STOOL
C difficile toxin A/B: POSITIVE
Campylobacter by PCR: NEGATIVE
Cryptosporidium by PCR: NEGATIVE
E COLI (STEC): NEGATIVE
E coli (ETEC) LT/ST: NEGATIVE
E coli 0157 by PCR: NEGATIVE
G lamblia by PCR: NEGATIVE
NOROVIRUS G1/G2: NEGATIVE
Rotavirus A by PCR: NEGATIVE
SHIGELLA BY PCR: NEGATIVE
Salmonella by PCR: NEGATIVE

## 2014-04-26 LAB — BASIC METABOLIC PANEL
ANION GAP: 11 (ref 5–15)
BUN: 29 mg/dL — ABNORMAL HIGH (ref 6–23)
CO2: 24 mEq/L (ref 19–32)
Calcium: 9.8 mg/dL (ref 8.4–10.5)
Chloride: 103 mEq/L (ref 96–112)
Creatinine, Ser: 1.54 mg/dL — ABNORMAL HIGH (ref 0.50–1.10)
GFR, EST AFRICAN AMERICAN: 35 mL/min — AB (ref >90)
GFR, EST NON AFRICAN AMERICAN: 30 mL/min — AB (ref >90)
Glucose, Bld: 108 mg/dL — ABNORMAL HIGH (ref 70–99)
Potassium: 3.6 mEq/L — ABNORMAL LOW (ref 3.7–5.3)
SODIUM: 138 meq/L (ref 137–147)

## 2014-04-26 MED ORDER — VITAMIN D3 25 MCG (1000 UNIT) PO TABS
1000.0000 [IU] | ORAL_TABLET | Freq: Every day | ORAL | Status: DC
  Administered 2014-04-26 – 2014-05-11 (×15): 1000 [IU] via ORAL
  Filled 2014-04-26 (×16): qty 1

## 2014-04-26 MED ORDER — CYCLOBENZAPRINE HCL 10 MG PO TABS
10.0000 mg | ORAL_TABLET | Freq: Two times a day (BID) | ORAL | Status: DC | PRN
  Administered 2014-04-27: 10 mg via ORAL
  Filled 2014-04-26: qty 1

## 2014-04-26 MED ORDER — ENOXAPARIN SODIUM 30 MG/0.3ML ~~LOC~~ SOLN
30.0000 mg | SUBCUTANEOUS | Status: DC
  Filled 2014-04-26: qty 0.3

## 2014-04-26 MED ORDER — PANTOPRAZOLE SODIUM 40 MG PO TBEC
80.0000 mg | DELAYED_RELEASE_TABLET | Freq: Every day | ORAL | Status: DC
  Administered 2014-04-26 – 2014-05-11 (×16): 80 mg via ORAL
  Filled 2014-04-26 (×16): qty 2

## 2014-04-26 MED ORDER — PANCRELIPASE (LIP-PROT-AMYL) 12000-38000 UNITS PO CPEP
24000.0000 [IU] | ORAL_CAPSULE | Freq: Three times a day (TID) | ORAL | Status: DC
  Administered 2014-04-27 – 2014-05-03 (×17): 24000 [IU] via ORAL
  Filled 2014-04-26 (×25): qty 2

## 2014-04-26 MED ORDER — POTASSIUM CHLORIDE CRYS ER 20 MEQ PO TBCR
40.0000 meq | EXTENDED_RELEASE_TABLET | Freq: Once | ORAL | Status: AC
  Administered 2014-04-26: 40 meq via ORAL
  Filled 2014-04-26: qty 2

## 2014-04-26 MED ORDER — PANCRELIPASE (LIP-PROT-AMYL) 24000-76000 UNITS PO CPEP: 1.0000 | ORAL_CAPSULE | Freq: Three times a day (TID) | ORAL | Status: DC

## 2014-04-26 MED ORDER — BOOST PLUS PO LIQD
237.0000 mL | Freq: Two times a day (BID) | ORAL | Status: DC
  Administered 2014-04-26: 237 mL via ORAL
  Filled 2014-04-26 (×3): qty 237

## 2014-04-26 MED ORDER — ALPRAZOLAM 0.5 MG PO TABS
0.5000 mg | ORAL_TABLET | Freq: Every evening | ORAL | Status: DC | PRN
  Administered 2014-04-26 – 2014-05-10 (×16): 0.5 mg via ORAL
  Filled 2014-04-26 (×16): qty 1

## 2014-04-26 MED ORDER — MORPHINE SULFATE 2 MG/ML IJ SOLN
2.0000 mg | Freq: Once | INTRAMUSCULAR | Status: AC
  Administered 2014-04-26: 2 mg via INTRAVENOUS
  Filled 2014-04-26: qty 1

## 2014-04-26 MED ORDER — METRONIDAZOLE IN NACL 5-0.79 MG/ML-% IV SOLN
500.0000 mg | Freq: Three times a day (TID) | INTRAVENOUS | Status: DC
  Administered 2014-04-26 – 2014-04-29 (×8): 500 mg via INTRAVENOUS
  Filled 2014-04-26 (×8): qty 100

## 2014-04-26 MED ORDER — SPIRONOLACTONE 50 MG PO TABS
50.0000 mg | ORAL_TABLET | Freq: Every day | ORAL | Status: DC
  Administered 2014-04-26 – 2014-05-07 (×12): 50 mg via ORAL
  Filled 2014-04-26 (×13): qty 1

## 2014-04-26 NOTE — Progress Notes (Addendum)
Patient ID: [name], female   DOB: 1928-08-14, 80 y.o.   MRN: [id number] [facility] PROGRESS NOTE  [name] [id number]:[id number] DOB: 15-Oct-1927 DOA: 04/25/2014 PCP: Alesia Richards, MD  Brief narrative: 80 y.o. female with past medical history of hypertension, prior history of MI, TIA, hepatoma who presented to [facility] ED 04/25/2014 status post fall at home. Pt also reported nausea and vomiting and having loose bowel movements but no diarrhea, On admission, x-ray of the lumbar spine and pelvis did not reveal acute fractures. CT head and CT cervical spine did not show acute intracranial findings. Chest x-ray showed no acute cardiopulmonary process. Blood work revealed mild hyponatremia, sodium 136. Creatinine was 1.45 and calcium 10.6. Urinalysis showed only trace leukocytes. Patient was admitted for further evaluation.  Assessment/Plan:    Principal Problem:   Fall, weakness, dehydration  Likely due to dehydration. We will continue supportive care with IV fluids.  Obtain physical therapy for evaluation Active Problems:   Hypertension  Blood pressure soft on the admission. All antihypertensives on hold except for spironolactone.   Acute renal failure  Likely secondary to dehydration  Continue IV fluids  Followup BMP in the morning Hypercalcemia  Likely due to dehydration. Resolved with IV fluids. DVT Prophylaxis   SCD's due to mild thrombocytopenia  Stop lovenox subQ   Code Status: Full.  Family Communication:  plan of care discussed with the patient Disposition Plan: Home when stable.    IV Access:   Peripheral IV Procedures and diagnostic studies:    Dg Chest 2 View 04/25/2014   IMPRESSION: No acute cardiopulmonary process. Cardiomegaly with mild central vascular congestion reidentified.     Dg Lumbar Spine Complete 04/25/2014    IMPRESSION: No acute osseous abnormality of the lumbar spine     Dg Pelvis 1-2 Views8/11/2013    IMPRESSION: Negative.     Ct Head Wo  Contrast 04/25/2014  IMPRESSION: CT head: No acute intracranial process. Normal noncontrast CT of the head for age.  CT cervical spine: Straightened cervical lordosis without acute fracture. Grade 1 C5-6 anterolisthesis on degenerative basis.     Ct Cervical Spine Wo Contrast 04/25/2014    IMPRESSION: CT head: No acute intracranial process. Normal noncontrast CT of the head for age.  CT cervical spine: Straightened cervical lordosis without acute fracture. Grade 1 C5-6 anterolisthesis on degenerative basis.     Dg Chest Port 1 View 04/25/2014    IMPRESSION: Retrocardiac opacity which could represent atelectasis given the degree of hypoaeration although pneumonia could appear similar. If symptoms persist, consider PA and lateral chest radiographs obtained at full inspiration when the patient is clinically able.  Medical Consultants:   None  Other Consultants:   PT evaluation  Anti-Infectives:   None    Carlinda Ohlson, MD  [facility] Pager [phone]  If 7PM-7AM, please contact night-coverage [email or web address] Password [email or web address] 04/26/2014, 8:45 AM   LOS: 1 day    HPI/Subjective: No acute overnight events.  Objective: Filed Vitals:   04/26/14 0235 04/26/14 0236 04/26/14 0237 04/26/14 0556  BP: 91/44 115/52 99/52 106/58  Pulse: 80 82 83 89  Temp:    97.1 F (36.2 C)  TempSrc:    Oral  Resp:    20  Height:      Weight:      SpO2:    97%    Intake/Output Summary (Last 24 hours) at 04/26/14 0845 Last data filed at 04/26/14 0630  Gross per 24 hour  Intake  597.5 ml  Output    165 ml  Net  432.5 ml    Exam:   General:  Pt is alert, follows commands appropriately, not in acute distress  Cardiovascular: Regular rate and rhythm, S1/S2, no murmurs  Respiratory: Clear to auscultation bilaterally, no wheezing, no crackles, no rhonchi  Abdomen: Soft, non tender, non distended, bowel sounds present  Extremities: No edema, (+) varicose veins, pulses DP and PT palpable  bilaterally  Neuro: Grossly nonfocal  Data Reviewed: Basic Metabolic Panel:  Recent Labs Lab 04/25/14 1706 04/26/14 0404  NA 136* 138  K 3.9 3.6*  CL 100 103  CO2 20 24  GLUCOSE 171* 108*  BUN 28* 29*  CREATININE 1.45* 1.54*  CALCIUM 10.6* 9.8   Liver Function Tests:  Recent Labs Lab 04/25/14 1706  AST 61*  ALT 35  ALKPHOS 145*  BILITOT 0.7  PROT 7.2  ALBUMIN 3.1*    Recent Labs Lab 04/25/14 1706  LIPASE 53   No results found for this basename: AMMONIA,  in the last 168 hours CBC:  Recent Labs Lab 04/25/14 1706  WBC 10.5  NEUTROABS 8.9*  HGB 13.9  HCT 41.0  MCV 94.5  PLT 138*   Cardiac Enzymes:  Recent Labs Lab 04/25/14 1706  TROPONINI <0.30   BNP: No components found with this basename: POCBNP,  CBG: No results found for this basename: GLUCAP,  in the last 168 hours  No results found for this or any previous visit (from the past 240 hour(s)).   Scheduled Meds: . enoxaparin (LOVENOX) injection  30 mg Subcutaneous Q24H   Continuous Infusions: . sodium chloride 75 mL/hr at 04/25/14 2232

## 2014-04-26 NOTE — Progress Notes (Signed)
Patient had not voided since I&O cath done around 1pm when I came in at 8 pm.  Peri care was performed and sterile technique used to insert 43f foley catheter.  Pt. Had 400 cc's amber colored urine immediately empty into bag.  Pt. also having several watery brown stools (with mucous).  Order for rectal tube received.  Rectal tube placed shortly after foley catheter.  Loose stool now visible in tube. Patient tolerated well. Order for flagyl received from hospitalist for cdiff.  Have paged MD for Pain medication orders for LLE.  Besides an eventful night, patient is calm and condition stable.  Will start flagyl, address pain issues, and monitor patient throughout the night.Azzie Glatter [place]

## 2014-04-26 NOTE — Progress Notes (Signed)
04/26/14 [place] [facility] [place] contacted by ED CM. Pt to be followed for d/c needs

## 2014-04-26 NOTE — Progress Notes (Signed)
PT Cancellation Note  Patient Details Name: CALLEN ZUBA MRN: [id number] DOB: 1928/09/04   Cancelled Treatment:    Reason Eval/Treat Not Completed: Fatigue/lethargy limiting ability to participate. Pt also experiencing uncontrolled diarrhea. Requests PT check back on tomorrow.    Weston Anna, MPT Pager: [phone]

## 2014-04-26 NOTE — Progress Notes (Signed)
Pt continues to have trouble voiding. No voiding since very early this morning. Bladder scan shows 590cc of urine in bladder. Dr Charlies Silvers notified and orders received. Will I&O cath patient and continue to monitor.

## 2014-04-26 NOTE — ED Provider Notes (Signed)
Medical screening examination/treatment/procedure(s) were conducted as a shared visit with non-physician practitioner(s) and myself.  I personally evaluated the patient during the encounter  Please see my separate respective documentation pertaining to this patient encounter   Johnna Acosta, MD 04/26/14 1125

## 2014-04-26 NOTE — Progress Notes (Signed)
I&O cath with 69fr catheter. 450 dark yellow urine returned. Pt tolerated procedure well.

## 2014-04-27 DIAGNOSIS — A0472 Enterocolitis due to Clostridium difficile, not specified as recurrent: Principal | ICD-10-CM

## 2014-04-27 LAB — URINE CULTURE
Colony Count: NO GROWTH
Culture: NO GROWTH

## 2014-04-27 LAB — CLOSTRIDIUM DIFFICILE BY PCR: CDIFFPCR: POSITIVE — AB

## 2014-04-27 MED ORDER — BOOST PLUS PO LIQD
237.0000 mL | Freq: Three times a day (TID) | ORAL | Status: DC
  Administered 2014-04-27 – 2014-05-01 (×10): 237 mL via ORAL
  Filled 2014-04-27 (×14): qty 237

## 2014-04-27 NOTE — Progress Notes (Addendum)
Patient ID: LAVETA GILKEY, female   DOB: 1928-04-28, 80 y.o.   MRN: [id number] [facility] PROGRESS NOTE  NEHEMIAH MONTEE HUD:[id number] DOB: 1927/11/08 DOA: 04/25/2014 PCP: Alesia Richards, MD  Brief narrative: 80 y.o. female with past medical history of hypertension, prior history of MI, TIA, hepatoma who presented to [facility] ED 04/25/2014 status post fall at home. Pt also reported nausea and vomiting and having loose bowel movements but no diarrhea, On admission, x-ray of the lumbar spine and pelvis did not reveal acute fractures. CT head and CT cervical spine did not show acute intracranial findings. Chest x-ray showed no acute cardiopulmonary process. Blood work revealed mild hyponatremia, sodium 136. Creatinine was 1.45 and calcium 10.6. Urinalysis showed only trace leukocytes. Patient was admitted for further evaluation.   Assessment/Plan:   Principal Problem:  Fall, weakness, dehydration  Likely due to dehydration. We will continue supportive care with IV fluids.  PT eval pending  OT eval pending  Active Problems:  Hypertension  Blood pressure soft on the admission. All antihypertensives on hold except for spironolactone. C.diff enteritis  Started flagyl 04/26/2014; due to ongoing diarrhea flexiseal inserted  Acute renal failure  Likely secondary to dehydration  Continue IV fluids for next 24 hours Hypercalcemia  Likely due to dehydration. Resolved with IV fluids. Moderate protein calorie malnutrition  Nutrition consulted  DVT Prophylaxis  SCD's due to mild thrombocytopenia  Stop lovenox subQ   Code Status: Full.  Family Communication: plan of care discussed with the patient  Disposition Plan: remains inpatient; may needs SNF on discharge   .  IV Access:   Peripheral IV Procedures and diagnostic studies:   Dg Chest 2 View 04/25/2014 IMPRESSION: No acute cardiopulmonary process. Cardiomegaly with mild central vascular congestion reidentified.  Dg Lumbar Spine Complete  04/25/2014 IMPRESSION: No acute osseous abnormality of the lumbar spine  Dg Pelvis 1-2 Views8/11/2013 IMPRESSION: Negative.  Ct Head Wo Contrast 04/25/2014 IMPRESSION: CT head: No acute intracranial process. Normal noncontrast CT of the head for age. CT cervical spine: Straightened cervical lordosis without acute fracture. Grade 1 C5-6 anterolisthesis on degenerative basis.  Ct Cervical Spine Wo Contrast 04/25/2014 IMPRESSION: CT head: No acute intracranial process. Normal noncontrast CT of the head for age. CT cervical spine: Straightened cervical lordosis without acute fracture. Grade 1 C5-6 anterolisthesis on degenerative basis.  Dg Chest Port 1 View 04/25/2014 IMPRESSION: Retrocardiac opacity which could represent atelectasis given the degree of hypoaeration although pneumonia could appear similar. If symptoms persist, consider PA and lateral chest radiographs obtained at full inspiration when the patient is clinically able.  Medical Consultants:   None  Other Consultants:   PT evaluation  Nutrition  Anti-Infectives:   Flagyl 04/26/2014 -->  Joshia Kitchings, MD  [facility] Pager [phone]  If 7PM-7AM, please contact night-coverage [email or web address] Password [email or web address] 04/27/2014, 1:57 PM   LOS: 2 days    HPI/Subjective: No acute overnight events.  Objective: Filed Vitals:   04/26/14 1412 04/26/14 2043 04/27/14 0508 04/27/14 1336  BP:  142/52  113/70  Pulse:  89 87 113  Temp: 100 F (37.8 C) 99.7 F (37.6 C) 99.4 F (37.4 C) 98.2 F (36.8 C)  TempSrc: Oral Oral Oral Oral  Resp:  18 16 17   Height:      Weight:      SpO2:  94% 93% 94%    Intake/Output Summary (Last 24 hours) at 04/27/14 1357 Last data filed at 04/27/14 1337  Gross per 24 hour  Intake   1013 ml  Output      0 ml  Net   1013 ml    Exam:   General:  Pt is alert, follows commands appropriately, not in acute distress  Cardiovascular: Regular rate and rhythm, S1/S2, no murmurs  Respiratory: Clear to auscultation  bilaterally, no wheezing, no crackles, no rhonchi  Abdomen: Soft, non tender, non distended, bowel sounds present  Extremities: No edema, pulses DP and PT palpable bilaterally  Neuro: Grossly nonfocal  Data Reviewed: Basic Metabolic Panel:  Recent Labs Lab 04/25/14 1706 04/26/14 0404  NA 136* 138  K 3.9 3.6*  CL 100 103  CO2 20 24  GLUCOSE 171* 108*  BUN 28* 29*  CREATININE 1.45* 1.54*  CALCIUM 10.6* 9.8   Liver Function Tests:  Recent Labs Lab 04/25/14 1706  AST 61*  ALT 35  ALKPHOS 145*  BILITOT 0.7  PROT 7.2  ALBUMIN 3.1*    Recent Labs Lab 04/25/14 1706  LIPASE 53   No results found for this basename: AMMONIA,  in the last 168 hours CBC:  Recent Labs Lab 04/25/14 1706  WBC 10.5  NEUTROABS 8.9*  HGB 13.9  HCT 41.0  MCV 94.5  PLT 138*   Cardiac Enzymes:  Recent Labs Lab 04/25/14 1706  TROPONINI <0.30   BNP: No components found with this basename: POCBNP,  CBG: No results found for this basename: GLUCAP,  in the last 168 hours  Recent Results (from the past 240 hour(s))  URINE CULTURE     Status: None   Collection Time    04/25/14  6:40 PM      Result Value Ref Range Status   Specimen Description URINE, RANDOM   Final   Special Requests NONE   Final   Culture  Setup Time     Final   Value: 04/26/2014 05:55     Performed at [facility]     Final   Value: NO GROWTH     Performed at [facility]   Culture     Final   Value: NO GROWTH     Performed at [facility]   Report Status 04/27/2014 FINAL   Final  CULTURE, BLOOD (ROUTINE X 2)     Status: None   Collection Time    04/25/14  6:46 PM      Result Value Ref Range Status   Specimen Description BLOOD BLOOD RIGHT FOREARM   Final   Special Requests BOTTLES DRAWN AEROBIC AND ANAEROBIC 3ML   Final   Culture  Setup Time     Final   Value: 04/25/2014 22:52     Performed at [facility]   Culture     Final   Value:        BLOOD CULTURE  RECEIVED NO GROWTH TO DATE CULTURE WILL BE HELD FOR 5 DAYS BEFORE ISSUING A FINAL NEGATIVE REPORT     Performed at [facility]   Report Status PENDING   Incomplete  CULTURE, BLOOD (ROUTINE X 2)     Status: None   Collection Time    04/25/14  6:48 PM      Result Value Ref Range Status   Specimen Description BLOOD RIGHT ANTECUBITAL   Final   Special Requests BOTTLES DRAWN AEROBIC AND ANAEROBIC 3ML   Final   Culture  Setup Time     Final   Value: 04/25/2014 22:51     Performed at [facility]  Final   Value:        BLOOD CULTURE RECEIVED NO GROWTH TO DATE CULTURE WILL BE HELD FOR 5 DAYS BEFORE ISSUING A FINAL NEGATIVE REPORT     Performed at [facility]   Report Status PENDING   Incomplete  STOOL CULTURE     Status: None   Collection Time    04/25/14 11:36 PM      Result Value Ref Range Status   Specimen Description STOMA   Final   Special Requests Normal   Final   Culture     Final   Value: NO SUSPICIOUS COLONIES, CONTINUING TO HOLD     Performed at [facility]   Report Status PENDING   Incomplete  URINE CULTURE     Status: None   Collection Time    04/25/14 11:38 PM      Result Value Ref Range Status   Specimen Description URINE, CLEAN CATCH   Final   Special Requests Normal   Final   Culture  Setup Time     Final   Value: 04/26/2014 05:55     Performed at [place]     Final   Value: 50,000 COLONIES/ML     Performed at [facility]   Culture     Final   Value: [place]     Performed at [facility]   Report Status PENDING   Incomplete  CLOSTRIDIUM DIFFICILE BY PCR     Status: Abnormal   Collection Time    04/26/14  5:19 PM      Result Value Ref Range Status   C difficile by pcr POSITIVE (*) NEGATIVE Final   Comment: CRITICAL RESULT CALLED TO, READ BACK BY AND VERIFIED WITH:     Gay Filler RN 04/27/14 0734 COSTELLO B     Performed at [facility]     Scheduled  Meds: . cholecalciferol  1,000 Units Oral Daily  . lactose free nutrition  237 mL Oral TID WC  . lipase/protease/amylase  24,000 Units Oral TID AC  . metronidazole  500 mg Intravenous 3 times per day  . pantoprazole  80 mg Oral Daily  . spironolactone  50 mg Oral Daily   Continuous Infusions:

## 2014-04-27 NOTE — Care Management Note (Addendum)
Page 1 of 2   05/11/2014     12:52:57 PM CARE MANAGEMENT NOTE 05/11/2014  Patient:  [name], [name]   Account Number:  [id number]  Date Initiated:  04/27/2014  Documentation initiated by:  Sunday Spillers  Subjective/Objective Assessment:   80 yo female admitted with fall, dehydration, UTI. PTA lived at home alone. Active with [facility].     Action/Plan:   SNF vs LTAC   Anticipated DC Date:  05/12/2014   Anticipated DC Plan:  LONG TERM ACUTE CARE (LTAC)  In-house referral  Clinical Social Worker      DC Planning Services  CM consult      Choice offered to / List presented to:             Status of service:  Completed, signed off Medicare Important Message given?  YES (If response is "NO", the following Medicare IM given date fields will be blank) Date Medicare IM given:  04/25/2014 Medicare IM given by:   Date Additional Medicare IM given:  05/11/2014 Additional Medicare IM given by:  [facility] Verlena Marlette  Discharge Disposition:  LONG TERM ACUTE CARE (LTAC)  Per UR Regulation:  Reviewed for med. necessity/level of care/duration of stay  If discussed at [place] of Stay Meetings, dates discussed:    Comments:  05/11/14 12:00 CM spoke with daughter Suanne Marker, who with the pt and family choose [facility].  CM confirmed with [facility] rep, Sonia Baller pt accepts bed.  MD has completed Transfer form, RN will report to [facility] and call [place].  No other CM needs were communicated.  Mariane Masters, BSN, [place].  05/10/14 14:15 CM received call from [facility] at [place].  CM spoke to pt and daughte, Suanne Marker, r of bed availability.  CM confirmed with Sonia Baller this could potentially give pt a longer time to recuperate and LTAC is higher skilled environment than SNF and SNF may still be a possibility after LTAC.   Suanne Marker is touring [facility] this afternoon and would then like to have MD input.  CM texted MD of [facility] bed availibilty.  CM called CSW to notify her [facility] possibility and daughter is touring [facility]  this afternooon. Will continue to follow for disposiiton.  Mariane Masters, BSN, [place].  05/04/14 07:45 CM received notification from MD: Daughter went on [facility] tour and does not want her mother at [facility] and wants to go to [place].  MD states she will wean pt off TPN and please arrange for pt to go to [facility] Friday 05/05/14.  CM spoke with family who confirm this is their understanding of plan.  CM spoke with CSW who is arranging SNF.  Maudie Mercury, from [place] notified Maudie Mercury on unit).  No other CM needs were communicated.  Mariane Masters, BSN, [place].   05/03/14 15:00 CM spoke with [facility] who [facility] bc [facility] is [facility] to her daughter's house.  However, when CM spoke with daughter Suanne Marker, she would like to look into [facility], too.  CM called Sonia Baller at [facility] who called back and stated she did not feel pt met critera and would not accept referral. CM reported to [facility] and pt.  Pt referral accepted by [facility].   CM spoke with Maudie Mercury at [place] who then set up an appointment with Suanne Marker to visit [facility] this afternoon with Maudie Mercury.  CM will follow for Rhonda's response.  Mariane Masters, BSN, [place].  04/28/14 12:40 CM spoke with pt in room to verify follow up appts after last hospitalization.  Pt states  she did not have enough time to go before she fell.  Pt is willing to consider SNF if PT/OT recc.  Pt currently lives in a senior independent living apartment (alone) and home health needs are taken care of by [facility].  Will continue to follow for disposition.  Mariane Masters, BSN, CM [phone].

## 2014-04-27 NOTE — Evaluation (Addendum)
Physical Therapy Evaluation Patient Details Name: SPENSER CONG MRN: [id number] DOB: 08/25/1928 Today's Date: 04/27/2014   History of Present Illness  80 yo female admitted with fall, diarrhea, LE cellulitis. Hx of HTN, cirrhosis. Pt lives alone in [place].   Clinical Impression  On eval, pt required Mod assist +2 for equipment/safety-able to stand and take a few steps with walker towards HOB. Demonstrates general weakness, decreased activity tolerance, and impaired gait and balance. Pt is from home alone. Recommend ST rehab at SNF prior to returning home alone. With pt's permission, spoke with daughter Suanne Marker) about therapy recommendations.     Follow Up Recommendations SNF    Equipment Recommendations  None recommended by PT    Recommendations for Other Services OT consult     Precautions / Restrictions Precautions Precautions: Fall Precaution Comments: rectal tube Restrictions Weight Bearing Restrictions: No      Mobility  Bed Mobility Overal bed mobility: Needs Assistance Bed Mobility: Supine to Sit;Sit to Supine     Supine to sit: Mod assist;+2 for safety/equipment;HOB elevated Sit to supine: Mod assist;+2 for safety/equipment;HOB elevated   General bed mobility comments: Assist for trunk and LEs, and to manage lines/tube. Increased time.  Transfers Overall transfer level: Needs assistance Equipment used: Rolling walker (2 wheeled) Transfers: Sit to/from Stand Sit to Stand: Mod assist;+2 safety/equipment         General transfer comment: Assist to rise, stabilize, control descent. VCS safety, technique,hand placement.   Ambulation/Gait Ambulation/Gait assistance: Mod assist;+2 safety/equipment   Assistive device: Rolling walker (2 wheeled)       General Gait Details: Pre-gait marching in place for ~1 minutes. Then, 3-4 lateral steps toward [facility] with walker.   Stairs            Wheelchair Mobility    Modified Rankin (Stroke Patients Only)        Balance                                             Pertinent Vitals/Pain Bottom from rectal tube-unrated    Home Living Family/patient expects to be discharged to:: Private residence Living Arrangements: Alone   Type of Home: Independent living facility Home Access: Level entry     [place]: One [place]: Environmental consultant - 2 wheels;Bedside commode;Grab bars - tub/shower;Shower seat      Prior Function Level of Independence: Independent with assistive device(s)         Comments: Still driving, uses RW PRN as needed for community ambulation.     Hand Dominance        Extremity/Trunk Assessment   Upper Extremity Assessment: Generalized weakness           Lower Extremity Assessment: Generalized weakness      Cervical / Trunk Assessment: Kyphotic  Communication   Communication: HOH  Cognition Arousal/Alertness: Awake/alert Behavior During Therapy: WFL for tasks assessed/performed Overall Cognitive Status: Within Functional Limits for tasks assessed                      General Comments      Exercises        Assessment/Plan    PT Assessment Patient needs continued PT services  PT Diagnosis Difficulty walking;Generalized weakness   PT Problem List Decreased strength;Decreased activity tolerance;Decreased balance;Decreased mobility;Decreased knowledge of use of DME;Pain  PT Treatment Interventions DME  instruction;Gait training;Functional mobility training;Therapeutic activities;Patient/family education;Balance training;Therapeutic exercise   PT Goals (Current goals can be found in the Care Plan section) Acute Rehab PT Goals Patient Stated Goal: to get stronger. return home.  PT Goal Formulation: With patient Time For Goal Achievement: 05/11/14 Potential to Achieve Goals: Good    Frequency Min 3X/week   Barriers to discharge Decreased caregiver support      Co-evaluation               End of  Session   Activity Tolerance: Patient limited by fatigue Patient left: in bed;with call bell/phone within reach;with bed alarm set           Time: 6553-7482 PT Time Calculation (min): 16 min   Charges:   PT Evaluation $Initial PT Evaluation Tier I: 1 Procedure PT Treatments $Gait Training: 8-22 mins   PT G Codes:          Weston Anna, MPT Pager: [phone]

## 2014-04-27 NOTE — Progress Notes (Signed)
INITIAL NUTRITION ASSESSMENT  DOCUMENTATION CODES Per approved criteria  -Non-severe (moderate) malnutrition in the context of chronic illness  Pt meets criteria for moderate MALNUTRITION in the context of chronic illness as evidenced by 5% body weight loss in one month, PO intake < 75% for one month.   INTERVENTION: -Recommend Boost Plus TID w/meals -Encouraged intake of binding fiber foods to assist with loose stools -Will continue to monitor  NUTRITION DIAGNOSIS: Inadequate oral intake related to weakness/loose stools as evidenced by PO intake < 75% for one month, 5% body weight loss in one month.   Goal: Pt to meet >/= 90% of their estimated nutrition needs    Monitor:  Total protein/energy intake, GI profile, labs, weight, glucose profile  Reason for Assessment: MST  80 y.o. female  Admitting Dx: Fall  ASSESSMENT: [name] is a 80 y.o. female with prior h/o hypertension, diet controlled DM, recently admitted for cellulitis of the left leg and discharged on the 7/29, brought in by her daughter after she found her on the bathroom floor. As per the patient, she felt Weak, slightly went to the bathroom, and the next thing she knew she was on the floor. She also reports loose bowel Movements since 2 days, associated with nausea, no vomiting  -Pt's daughter reported decreased appetite, loose stools, and weakness for past one month -Daughter prepares meals for patient, and has noticed pt with minimal intake of meals when she visits -Pt consuming two Boost daily.  -Endorsed 10 lbs weight loss in one month (5% body weight loss in one month, significant for time frame) -Experiencing ongoing loose stools. Encouraged intake of binding fiber foods (bananas, rice, applesauce, rice) to assist in forming stools -Has had minimal intake of meals, < 0% of meals, even with encouragement from family. Will increase Boost to TID w/meals for nutrient replenishment  Height: Ht Readings from  Last 1 Encounters:  04/25/14 5\' 6"  (1.676 m)    Weight: Wt Readings from Last 1 Encounters:  04/25/14 171 lb 8 oz (77.792 kg)    Ideal Body Weight: 130 lbs  % Ideal Body Weight: 76%  Wt Readings from Last 10 Encounters:  04/25/14 171 lb 8 oz (77.792 kg)  04/17/14 176 lb 0.2 oz (79.84 kg)  04/13/14 176 lb (79.833 kg)  03/31/14 181 lb (82.101 kg)  01/20/14 182 lb 12.8 oz (82.918 kg)  12/16/13 174 lb 3.2 oz (79.017 kg)  11/29/13 173 lb 6.4 oz (78.654 kg)  11/04/13 188 lb (85.276 kg)  10/26/13 190 lb (86.183 kg)  09/10/13 186 lb 9.6 oz (84.641 kg)    Usual Body Weight: 180 lbs  % Usual Body Weight: 95%  BMI:  Body mass index is 27.69 kg/(m^2).  Estimated Nutritional Needs: Kcal: 1550-1750  Protein: 75-85 gram Fluid: >/=1600 ml/daily  Skin: WDL  Diet Order: Carb Control  EDUCATION NEEDS: -Education needs addressed   Intake/Output Summary (Last 24 hours) at 04/27/14 1306 Last data filed at 04/27/14 0508  Gross per 24 hour  Intake   1013 ml  Output      0 ml  Net   1013 ml    Last BM: 8/05   Labs:   Recent Labs Lab 04/25/14 1706 04/26/14 0404  NA 136* 138  K 3.9 3.6*  CL 100 103  CO2 20 24  BUN 28* 29*  CREATININE 1.45* 1.54*  CALCIUM 10.6* 9.8  GLUCOSE 171* 108*    CBG (last 3)  No results found for this basename: GLUCAP,  in the last 72 hours  Scheduled Meds: . cholecalciferol  1,000 Units Oral Daily  . lactose free nutrition  237 mL Oral TID WC  . lipase/protease/amylase  24,000 Units Oral TID AC  . metronidazole  500 mg Intravenous 3 times per day  . pantoprazole  80 mg Oral Daily  . spironolactone  50 mg Oral Daily    Continuous Infusions:   Past Medical History  Diagnosis Date  . Anemia, iron deficiency 08/07/2011  . Angiodysplasia of intestinal tract 08/07/2011  . Hypertension   . Arthritis   . Diverticulitis   . Phlebitis   . Macular degeneration   . TIA (transient ischemic attack)   . MI (myocardial infarction)   .  Hiatal hernia   . AVM (arteriovenous malformation) of colon with hemorrhage   . Varicose veins of legs   . Cataract   . GERD (gastroesophageal reflux disease)   . DJD (degenerative joint disease)   . Diabetes mellitus     borderline  . History of blood transfusion   . PONV (postoperative nausea and vomiting)   . hepatocellar ca dx'd 08/2012  . Edema leg   . Fatty liver   . Vitamin D deficiency     Past Surgical History  Procedure Laterality Date  . 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 Abdominal hysterectomy    . Hemorrhoid surgery    . Esophagogastroduodenoscopy  08/30/2012    Procedure: ESOPHAGOGASTRODUODENOSCOPY (EGD);  Surgeon: Jeryl Columbia, MD;  Location: [facility];  Service: Endoscopy;  Laterality: N/A;  . Radiofrequency ablation liver tumor Right 01/2013    HFA ablation right liver lesion  . Colonoscopy  06/2009    Dr. Watt Climes, due 5 years  . Endoscopic vein laser treatment Left 2014    Dr. Beryl Meager MS RD LDN Clinical Dietitian [phone]

## 2014-04-28 ENCOUNTER — Inpatient Hospital Stay (HOSPITAL_COMMUNITY): Payer: Medicare Other

## 2014-04-28 ENCOUNTER — Encounter (HOSPITAL_COMMUNITY): Payer: Self-pay | Admitting: Radiology

## 2014-04-28 LAB — URINE CULTURE
Colony Count: 50000
Special Requests: NORMAL

## 2014-04-28 MED ORDER — TRAMADOL HCL 50 MG PO TABS
50.0000 mg | ORAL_TABLET | Freq: Four times a day (QID) | ORAL | Status: DC | PRN
  Administered 2014-04-28 – 2014-05-08 (×6): 50 mg via ORAL
  Filled 2014-04-28 (×6): qty 1

## 2014-04-28 MED ORDER — IOHEXOL 300 MG/ML  SOLN
25.0000 mL | INTRAMUSCULAR | Status: AC
  Administered 2014-04-28 (×2): 25 mL via ORAL

## 2014-04-28 MED ORDER — IOHEXOL 300 MG/ML  SOLN
80.0000 mL | Freq: Once | INTRAMUSCULAR | Status: AC | PRN
  Administered 2014-04-28: 80 mL via INTRAVENOUS

## 2014-04-28 MED ORDER — SODIUM CHLORIDE 0.9 % IV SOLN
INTRAVENOUS | Status: AC
  Administered 2014-04-28: 1000 mL via INTRAVENOUS

## 2014-04-28 NOTE — Progress Notes (Signed)
Clinical Social Work Department BRIEF PSYCHOSOCIAL ASSESSMENT 04/28/2014  Patient:  [name], [name]     Account Number:  [id number]     Admit date:  04/25/2014  Clinical Social Worker:  Earlie Server  Date/Time:  04/28/2014 01:00 PM  Referred by:  Physician  Date Referred:  04/28/2014 Referred for  SNF Placement   Other Referral:   Interview type:  Patient Other interview type:    PSYCHOSOCIAL DATA Living Status:  ALONE Admitted from facility:   Level of care:   Primary support name:  [name] Primary support relationship to patient:  CHILD, ADULT Degree of support available:   Adequate    CURRENT CONCERNS Current Concerns  Post-Acute Placement   Other Concerns:    SOCIAL WORK ASSESSMENT / PLAN CSW received referral in order to assist with DC planning. CSW reviewed chart and met with patient at bedside. CSW introduced myself and explained role.    Patient reports that she lives at home alone and was doing well PTA but that she has felt weak while being in the hospital. Patient reports she recently went to rehab at [facility] and enjoyed her therapy there. CSW provided SNF list and explained DC plans. Patient reports that she would consider returning to [facility] but wanted to know her options as well so is agreeable to a [facility].    CSW completed FL2 and faxed out. CSW will follow up with bed offers.   Assessment/plan status:  Psychosocial Support/Ongoing Assessment of Needs Other assessment/ plan:   Information/referral to community resources:   SNF list    PATIENT'S/FAMILY'S RESPONSE TO PLAN OF CARE: Patient alert and oriented. Patient engaged in assessment. Patient reports that she has children that live locally and grandchildren but that she does not want to be a burden on family. Patient reports that she did well at [facility] in the past and knows that she could benefit from [facility] SNF. Patient states she only wants to stay for 1-2 weeks at SNF because she enjoys  her independence.       Sindy Messing, LCSW (Coverage for [employer])

## 2014-04-28 NOTE — Progress Notes (Signed)
Physical Therapy Treatment Patient Details Name: TABOR BARTRAM MRN: [id number] DOB: Jan 08, 1928 Today's Date: 04/28/2014    History of Present Illness 80 yo female admitted with fall, diarrhea, LE cellulitis. Hx of HTN, cirrhosis. Pt lives alone in [place].     PT Comments    Progressing with mobility. Tolerated activity well. Continue to recommend SNF  Follow Up Recommendations  SNF     Equipment Recommendations  None recommended by PT    Recommendations for Other Services       Precautions / Restrictions Precautions Precautions: Fall Precaution Comments: rectal tube, cdiff Restrictions Weight Bearing Restrictions: No    Mobility  Bed Mobility Overal bed mobility: Needs Assistance Bed Mobility: Supine to Sit;Sit to Supine     Supine to sit: Min assist Sit to supine: Min assist   General bed mobility comments: Assist for trunk and LEs, and to manage lines/tube. Increased time.  Transfers Overall transfer level: Needs assistance Equipment used: Rolling walker (2 wheeled) Transfers: Sit to/from Stand Sit to Stand: Min assist Stand pivot transfers: Min assist       General transfer comment: assist to power up and gain balance initially  Ambulation/Gait Ambulation/Gait assistance: Min assist;+2 safety/equipment Ambulation Distance (Feet): 100 Feet Assistive device: Rolling walker (2 wheeled) Gait Pattern/deviations: Step-through pattern;Decreased stride length     General Gait Details: assist to stabilize and manage lines/equipmenet. slow gait speed.    Stairs            Wheelchair Mobility    Modified Rankin (Stroke Patients Only)       Balance Overall balance assessment: Needs assistance;History of Falls         Standing balance support: Bilateral upper extremity supported;During functional activity Standing balance-Leahy Scale: Poor                      Cognition Arousal/Alertness: Awake/alert Behavior During Therapy: WFL  for tasks assessed/performed Overall Cognitive Status: Within Functional Limits for tasks assessed                      Exercises      General Comments        Pertinent Vitals/Pain Pain Assessment: No/denies pain    Home Living Family/patient expects to be discharged to:: Skilled nursing facility     Type of Home: Independent living facility ([facility])       Home Equipment: [place] Rile - 2 wheels;Bedside commode;Grab bars - tub/shower;Shower seat      Prior Function Level of Independence: Independent with assistive device(s)      Comments: Still driving, uses RW PRN as needed for community ambulation.   PT Goals (current goals can now be found in the care plan section) Acute Rehab PT Goals Patient Stated Goal: to get stronger. return home.  Progress towards PT goals: Progressing toward goals    Frequency  Min 3X/week    PT Plan Current plan remains appropriate    Co-evaluation PT/OT/SLP Co-Evaluation/Treatment: Yes Reason for Co-Treatment: For patient/therapist safety PT goals addressed during session: Balance;Proper use of DME OT goals addressed during session: ADL's and self-care     End of Session   Activity Tolerance: Patient limited by fatigue Patient left: with call bell/phone within reach;with bed alarm set     Time: 0922-0952 PT Time Calculation (min): 30 min  Charges:  $Gait Training: 8-22 mins $Therapeutic Activity: 8-22 mins  G Codes:      Weston Anna, MPT Pager: [phone]

## 2014-04-28 NOTE — Evaluation (Signed)
Occupational Therapy Evaluation Patient Details Name: DEMIA VIERA MRN: [id number] DOB: 11-13-1927 Today's Date: 04/28/2014    History of Present Illness 80 yo female admitted with fall, diarrhea, LE cellulitis. Hx of HTN, cirrhosis. Pt lives alone in [place].    Clinical Impression   Prior to admission, pt reports living alone independent in self care and intermittent assist of her family for IADL.  Pt presents with decreased activity tolerance, impaired balance, and distended abdomen interfering with ability to perform ADL.  Pt is highly motivated to return home, but agreeable to short term SNF for rehab. Will follow acutely to minimize stay at SNF.    Follow Up Recommendations  SNF    Equipment Recommendations       Recommendations for Other Services       Precautions / Restrictions Precautions Precautions: Fall Precaution Comments: rectal tube, cdiff      Mobility Bed Mobility Overal bed mobility: Needs Assistance Bed Mobility: Supine to Sit;Sit to Supine     Supine to sit: Min assist;HOB elevated Sit to supine: Min assist;HOB elevated   General bed mobility comments: Assist for trunk and LEs, and to manage lines/tube. Increased time.  Transfers Overall transfer level: Needs assistance Equipment used: Rolling walker (2 wheeled) Transfers: Sit to/from Stand Sit to Stand: Min assist         General transfer comment: assist to power up and gain balance initially    Balance                                            ADL Overall ADL's : Needs assistance/impaired Eating/Feeding: Independent;Bed level   Grooming: Wash/dry hands;Wash/dry face;Oral care;Minimal assistance;Standing;Sitting Grooming Details (indicate cue type and reason): completed 2 activities at sink, one seated Upper Body Bathing: Minimal assitance;Sitting   Lower Body Bathing: Maximal assistance;Sit to/from stand   Upper Body Dressing : Set up;Sitting   Lower Body  Dressing: Maximal assistance;Sit to/from Health and safety inspector Details (indicate cue type and reason): pt with rectal tube and foley Toileting- Clothing Manipulation and Hygiene: Maximal assistance;Sit to/from stand (stood to wash periarea and apply barrier cream)       Functional mobility during ADLs: Minimal assistance;Rolling walker (second person for safety with ambulation only)       Vision                     Perception     Praxis      Pertinent Vitals/Pain Abdominal discomfort/distention, rectal pain, repositioned and cleaned and applied barrier cream, VSS     Hand Dominance Right   Extremity/Trunk Assessment Upper Extremity Assessment Upper Extremity Assessment: Generalized weakness   Lower Extremity Assessment Lower Extremity Assessment: Defer to PT evaluation   Cervical / Trunk Assessment Cervical / Trunk Assessment: Kyphotic   Communication Communication Communication: HOH   Cognition Arousal/Alertness: Awake/alert Behavior During Therapy: WFL for tasks assessed/performed Overall Cognitive Status: Within Functional Limits for tasks assessed                     General Comments       Exercises       Shoulder Instructions      Home Living Family/patient expects to be discharged to:: Skilled nursing facility     Type of Home: Independent living facility ([facility])  Home Equipment: [place] - 2 wheels;Bedside commode;Grab bars - tub/shower;Shower seat          Prior Functioning/Environment Level of Independence: Independent with assistive device(s)        Comments: Still driving, uses RW PRN as needed for community ambulation.    OT Diagnosis: Generalized weakness   OT Problem List: Decreased strength;Decreased activity tolerance;Impaired balance (sitting and/or standing)   OT Treatment/Interventions: Self-care/ADL training;DME and/or AE instruction;Therapeutic activities;Patient/family  education;Balance training    OT Goals(Current goals can be found in the care plan section) Acute Rehab OT Goals Patient Stated Goal: to get stronger. return home.  OT Goal Formulation: With patient Time For Goal Achievement: 05/12/14 Potential to Achieve Goals: Good  OT Frequency: Min 2X/week   Barriers to D/C: Decreased caregiver support          Co-evaluation PT/OT/SLP Co-Evaluation/Treatment: Yes Reason for Co-Treatment: For patient/therapist safety   OT goals addressed during session: ADL's and self-care      End of Session Nurse Communication: Mobility status  Activity Tolerance: Patient tolerated treatment well Patient left: in bed;with call bell/phone within reach;with bed alarm set   Time: 0920-0952 OT Time Calculation (min): 32 min Charges:  OT General Charges $OT Visit: 1 Procedure OT Evaluation $Initial OT Evaluation Tier I: 1 Procedure G-Codes:    Malka So 04/28/2014, 10:44 AM [phone]

## 2014-04-28 NOTE — Progress Notes (Addendum)
Clinical Social Work Department CLINICAL SOCIAL WORK PLACEMENT NOTE 04/28/2014  Patient:  [name], [name]  Account Number:  [id number] Admit date:  04/25/2014  Clinical Social Worker:  Sindy Messing, LCSW  Date/time:  04/28/2014 01:00 PM  Clinical Social Work is seeking post-discharge placement for this patient at the following level of care:   [place]   (*CSW will update this form in Epic as items are completed)   04/28/2014  Patient/family provided with [place] Department of Clinical Social Work's list of facilities offering this level of care within the geographic area requested by the patient (or if unable, by the patient's family).  04/28/2014  Patient/family informed of their freedom to choose among providers that offer the needed level of care, that participate in Medicare, Medicaid or managed care program needed by the patient, have an available bed and are willing to accept the patient.  04/28/2014  Patient/family informed of [facility]' ownership interest in [facility], as well as of the fact that they are under no obligation to receive care at this facility.  PASARR submitted to EDS on existing # PASARR number received on   FL2 transmitted to all facilities in geographic area requested by pt/family on  04/28/2014 FL2 transmitted to all facilities within larger geographic area on   Patient informed that his/her managed care company has contracts with or will negotiate with  certain facilities, including the following:     Patient/family informed of bed offers received:  04/29/14 Patient chooses bed at [facility] Physician recommends and patient chooses bed at    Patient to be transferred to  on  [facility] Patient to be transferred to facility by [facility] Patient and family notified of transfer on 05/11/2014 Name of family member notified:  RNCM communicated with pt and pt daughter about transfer to [facility]  The following  physician request were entered in Epic:   Additional Comments:   Alison Murray, MSW, [place] Work [phone]

## 2014-04-28 NOTE — Progress Notes (Signed)
Patient ID: [name], female   DOB: 1927-09-28, 80 y.o.   MRN: [id number] [facility] PROGRESS NOTE  SAKINAH ROSAMOND PNT:[id number] DOB: [birth year] DOA: 04/25/2014 PCP: Alesia Richards, MD  Brief narrative: 80 y.o. female with past medical history of hypertension, prior history of MI, TIA, hepatoma who presented to [facility] ED 04/25/2014 status post fall at home. Pt also reported nausea and vomiting and having loose bowel movements but no diarrhea, On admission, x-ray of the lumbar spine and pelvis did not reveal acute fractures. CT head and CT cervical spine did not show acute intracranial findings. Chest x-ray showed no acute cardiopulmonary process. Blood work revealed mild hyponatremia, sodium 136. Creatinine was 1.45 and calcium 10.6. Urinalysis showed only trace leukocytes. Patient was admitted for further evaluation. She was subsequently found to have C.diff enteritis.  Assessment/Plan:   Principal Problem:  Fall, weakness, dehydration  Likely due to dehydration. We will continue supportive care with IV fluids. Per PT, SNF recommended SW assisting D/C plan  Active Problems:  Hypertension  Blood pressure soft on the admission. All antihypertensives on hold except for spironolactone. BP this am 129/64 C.diff enteritis /Abdominal distention  Started flagyl 04/26/2014; due to ongoing diarrhea flexiseal inserted   Distention is mild but present. Obtain CT abd/pelvis for further evaluation. Acute renal failure  Likely secondary to dehydration  Obtain BMP in am. Hypercalcemia  Likely due to dehydration. Resolved with IV fluids. Moderate protein calorie malnutrition   Nutrition consulted  DVT Prophylaxis  SCD's due to mild thrombocytopenia    Code Status: Full.  Family Communication: plan of care discussed with the patient  Disposition Plan: SNF on discharge  .  IV Access:   Peripheral IV Procedures and diagnostic studies:   Dg Chest 2 View 04/25/2014 IMPRESSION: No acute  cardiopulmonary process. Cardiomegaly with mild central vascular congestion reidentified.  Dg Lumbar Spine Complete 04/25/2014 IMPRESSION: No acute osseous abnormality of the lumbar spine  Dg Pelvis 1-2 Views8/11/2013 IMPRESSION: Negative.  Ct Head Wo Contrast 04/25/2014 IMPRESSION: CT head: No acute intracranial process. Normal noncontrast CT of the head for age. CT cervical spine: Straightened cervical lordosis without acute fracture. Grade 1 C5-6 anterolisthesis on degenerative basis.  Ct Cervical Spine Wo Contrast 04/25/2014 IMPRESSION: CT head: No acute intracranial process. Normal noncontrast CT of the head for age. CT cervical spine: Straightened cervical lordosis without acute fracture. Grade 1 C5-6 anterolisthesis on degenerative basis.  Dg Chest Port 1 View 04/25/2014 IMPRESSION: Retrocardiac opacity which could represent atelectasis given the degree of hypoaeration although pneumonia could appear similar. If symptoms persist, consider PA and lateral chest radiographs obtained at full inspiration when the patient is clinically able.  Medical Consultants:   None  Other Consultants:   PT evaluation  Nutrition  Anti-Infectives:   Flagyl 04/26/2014 -->   Leisa Lenz, MD  [facility]  Pager [phone]   Leisa Lenz, MD  [facility] Pager [phone]  If 7PM-7AM, please contact night-coverage [email or web address] Password [email or web address] 04/28/2014, 7:16 AM   LOS: 3 days    HPI/Subjective: No acute overnight events.  Objective: Filed Vitals:   04/27/14 1336 04/27/14 1550 04/27/14 2028 04/28/14 0549  BP: 113/70  104/56 99/56  Pulse: 113  95 84  Temp: 98.2 F (36.8 C) 99.4 F (37.4 C) 99.6 F (37.6 C) 98.4 F (36.9 C)  TempSrc: Oral Oral Oral Oral  Resp: 17  18 18   Height:      Weight:      SpO2: 94%  93% 93%  Intake/Output Summary (Last 24 hours) at 04/28/14 0716 Last data filed at 04/28/14 0558  Gross per 24 hour  Intake  679.6 ml  Output    975 ml  Net -295.4 ml     Exam:   General:  Pt is sleeping this am, no distress  Cardiovascular: Regular rate and rhythm, S1/S2, no murmurs  Respiratory: Clear to auscultation bilaterally, no wheezing, no crackles  Abdomen: Soft, non tender, non distended, bowel sounds present  Extremities: No edema, pulses DP and PT palpable bilaterally  Neuro: Grossly nonfocal  Data Reviewed: Basic Metabolic Panel:  Recent Labs Lab 04/25/14 1706 04/26/14 0404  NA 136* 138  K 3.9 3.6*  CL 100 103  CO2 20 24  GLUCOSE 171* 108*  BUN 28* 29*  CREATININE 1.45* 1.54*  CALCIUM 10.6* 9.8   Liver Function Tests:  Recent Labs Lab 04/25/14 1706  AST 61*  ALT 35  ALKPHOS 145*  BILITOT 0.7  PROT 7.2  ALBUMIN 3.1*    Recent Labs Lab 04/25/14 1706  LIPASE 53   No results found for this basename: AMMONIA,  in the last 168 hours CBC:  Recent Labs Lab 04/25/14 1706  WBC 10.5  NEUTROABS 8.9*  HGB 13.9  HCT 41.0  MCV 94.5  PLT 138*   Cardiac Enzymes:  Recent Labs Lab 04/25/14 1706  TROPONINI <0.30   BNP: No components found with this basename: POCBNP,  CBG: No results found for this basename: GLUCAP,  in the last 168 hours  Recent Results (from the past 240 hour(s))  URINE CULTURE     Status: None   Collection Time    04/25/14  6:40 PM      Result Value Ref Range Status   Specimen Description URINE, RANDOM   Final   Special Requests NONE   Final   Culture  Setup Time     Final   Value: 04/26/2014 05:55     Performed at [place]     Final   Value: NO GROWTH     Performed at [facility]   Culture     Final   Value: NO GROWTH     Performed at [facility]   Report Status 04/27/2014 FINAL   Final  CULTURE, BLOOD (ROUTINE X 2)     Status: None   Collection Time    04/25/14  6:46 PM      Result Value Ref Range Status   Specimen Description BLOOD BLOOD RIGHT FOREARM   Final   Special Requests BOTTLES DRAWN AEROBIC AND ANAEROBIC 3ML   Final    Culture  Setup Time     Final   Value: 04/25/2014 22:52     Performed at [facility]   Culture     Final   Value:        BLOOD CULTURE RECEIVED NO GROWTH TO DATE CULTURE WILL BE HELD FOR 5 DAYS BEFORE ISSUING A FINAL NEGATIVE REPORT     Performed at [facility]   Report Status PENDING   Incomplete  CULTURE, BLOOD (ROUTINE X 2)     Status: None   Collection Time    04/25/14  6:48 PM      Result Value Ref Range Status   Specimen Description BLOOD RIGHT ANTECUBITAL   Final   Special Requests BOTTLES DRAWN AEROBIC AND ANAEROBIC 3ML   Final   Culture  Setup Time     Final   Value:  04/25/2014 22:51     Performed at [facility]   Culture     Final   Value:        BLOOD CULTURE RECEIVED NO GROWTH TO DATE CULTURE WILL BE HELD FOR 5 DAYS BEFORE ISSUING A FINAL NEGATIVE REPORT     Performed at [facility]   Report Status PENDING   Incomplete  STOOL CULTURE     Status: None   Collection Time    04/25/14 11:36 PM      Result Value Ref Range Status   Specimen Description STOMA   Final   Special Requests Normal   Final   Culture     Final   Value: NO SUSPICIOUS COLONIES, CONTINUING TO HOLD     Performed at [facility]   Report Status PENDING   Incomplete  URINE CULTURE     Status: None   Collection Time    04/25/14 11:38 PM      Result Value Ref Range Status   Specimen Description URINE, CLEAN CATCH   Final   Special Requests Normal   Final   Culture  Setup Time     Final   Value: 04/26/2014 05:55     Performed at [place]     Final   Value: 50,000 COLONIES/ML     Performed at [facility]   Culture     Final   Value: GRAM NEGATIVE RODS     Performed at [facility]   Report Status PENDING   Incomplete  CLOSTRIDIUM DIFFICILE BY PCR     Status: Abnormal   Collection Time    04/26/14  5:19 PM      Result Value Ref Range Status   C difficile by pcr POSITIVE (*) NEGATIVE Final   Comment:  CRITICAL RESULT CALLED TO, READ BACK BY AND VERIFIED WITH:     Gay Filler RN 04/27/14 0734 COSTELLO B     Performed at [facility]     Scheduled Meds: . cholecalciferol  1,000 Units Oral Daily  . lactose free nutrition  237 mL Oral TID WC  . lipase/protease/amylase  24,000 Units Oral TID AC  . metronidazole  500 mg Intravenous 3 times per day  . pantoprazole  80 mg Oral Daily  . spironolactone  50 mg Oral Daily   Continuous Infusions:

## 2014-04-29 ENCOUNTER — Inpatient Hospital Stay (HOSPITAL_COMMUNITY): Payer: Medicare Other

## 2014-04-29 DIAGNOSIS — J9600 Acute respiratory failure, unspecified whether with hypoxia or hypercapnia: Secondary | ICD-10-CM

## 2014-04-29 LAB — BASIC METABOLIC PANEL
Anion gap: 11 (ref 5–15)
BUN: 23 mg/dL (ref 6–23)
CO2: 19 meq/L (ref 19–32)
Calcium: 8.3 mg/dL — ABNORMAL LOW (ref 8.4–10.5)
Chloride: 105 mEq/L (ref 96–112)
Creatinine, Ser: 1.29 mg/dL — ABNORMAL HIGH (ref 0.50–1.10)
GFR calc Af Amer: 43 mL/min — ABNORMAL LOW (ref >90)
GFR, EST NON AFRICAN AMERICAN: 37 mL/min — AB (ref >90)
Glucose, Bld: 103 mg/dL — ABNORMAL HIGH (ref 70–99)
Potassium: 3.7 mEq/L (ref 3.7–5.3)
SODIUM: 135 meq/L — AB (ref 137–147)

## 2014-04-29 LAB — STOOL CULTURE: Special Requests: NORMAL

## 2014-04-29 LAB — LACTATE DEHYDROGENASE, PLEURAL OR PERITONEAL FLUID: LD FL: 78 U/L — AB (ref 3–23)

## 2014-04-29 LAB — PROTEIN, BODY FLUID: Total protein, fluid: 1.1 g/dL

## 2014-04-29 MED ORDER — IPRATROPIUM BROMIDE 0.02 % IN SOLN
0.5000 mg | Freq: Four times a day (QID) | RESPIRATORY_TRACT | Status: DC | PRN
  Administered 2014-05-01 – 2014-05-08 (×4): 0.5 mg via RESPIRATORY_TRACT
  Filled 2014-04-29 (×4): qty 2.5

## 2014-04-29 MED ORDER — METRONIDAZOLE 500 MG PO TABS
500.0000 mg | ORAL_TABLET | Freq: Three times a day (TID) | ORAL | Status: DC
  Administered 2014-04-29 – 2014-05-11 (×34): 500 mg via ORAL
  Filled 2014-04-29 (×40): qty 1

## 2014-04-29 MED ORDER — IPRATROPIUM BROMIDE 0.02 % IN SOLN: 0.5000 mg | Freq: Four times a day (QID) | RESPIRATORY_TRACT | Status: DC

## 2014-04-29 MED ORDER — FLUCONAZOLE 150 MG PO TABS
150.0000 mg | ORAL_TABLET | Freq: Every day | ORAL | Status: AC
  Administered 2014-04-29: 150 mg via ORAL
  Filled 2014-04-29: qty 1

## 2014-04-29 MED ORDER — FUROSEMIDE 10 MG/ML IJ SOLN
20.0000 mg | Freq: Once | INTRAMUSCULAR | Status: AC
  Administered 2014-04-29: 20 mg via INTRAVENOUS
  Filled 2014-04-29: qty 2

## 2014-04-29 MED ORDER — FLUCONAZOLE IN SODIUM CHLORIDE 200-0.9 MG/100ML-% IV SOLN
200.0000 mg | INTRAVENOUS | Status: DC
Start: 2014-04-29 — End: 2014-04-29

## 2014-04-29 MED ORDER — LEVALBUTEROL HCL 1.25 MG/0.5ML IN NEBU
1.2500 mg | INHALATION_SOLUTION | Freq: Four times a day (QID) | RESPIRATORY_TRACT | Status: DC
  Filled 2014-04-29 (×4): qty 0.5

## 2014-04-29 MED ORDER — LEVALBUTEROL HCL 1.25 MG/0.5ML IN NEBU
1.2500 mg | INHALATION_SOLUTION | Freq: Four times a day (QID) | RESPIRATORY_TRACT | Status: DC | PRN
  Administered 2014-05-01 – 2014-05-08 (×3): 1.25 mg via RESPIRATORY_TRACT
  Filled 2014-04-29 (×3): qty 0.5

## 2014-04-29 MED ORDER — IPRATROPIUM BROMIDE 0.02 % IN SOLN
0.5000 mg | Freq: Four times a day (QID) | RESPIRATORY_TRACT | Status: DC
  Filled 2014-04-29: qty 2.5

## 2014-04-29 MED ORDER — LEVALBUTEROL HCL 1.25 MG/0.5ML IN NEBU
1.2500 mg | INHALATION_SOLUTION | Freq: Four times a day (QID) | RESPIRATORY_TRACT | Status: DC
  Administered 2014-04-29: 1.25 mg via RESPIRATORY_TRACT
  Filled 2014-04-29 (×4): qty 0.5

## 2014-04-29 NOTE — Progress Notes (Signed)
Pt rectal tube came out and had to be reinserted pt tolerated fine complained of soreness at resctum and had prn pain med. . Pt also complain of increase in abdominal distention and sob. MD was notified no orders . Will continue to monitor pt closely.

## 2014-04-29 NOTE — Progress Notes (Signed)
Clinical Social Work  CSW met with patient at bedside in order to provide bed offers. Patient reports she will review list and talk with family. CSW offered to call family but patient reports family will come visit this afternoon. CSW left contact information and encouraged patient or family to call with any further questions.  Sindy Messing, LCSW (Coverage for [employer])

## 2014-04-29 NOTE — Procedures (Addendum)
US guided diagnostic/therapeutic right thoracentesis performed yielding 1.2 liters yellow fluid. The fluid was sent to the lab for preordered studies. F/u CXR pending. No immediate complications. No significant/accessible ascites was noted on limited US abdomen today.

## 2014-04-29 NOTE — Progress Notes (Signed)
Clinical Social Work  CSW met with patient, dtr, and granddtr at bedside. Patient and family reports that patient has stayed at [facility] in the past and it is close to family so that they can visit. Patient reports she will return to [place] so that family can visit. CSW spoke with SNF who is agreeable to accept when patient is medically stable.   CSW answered family's questions re: Medicare coverage, applying for Medicaid, and gave referral to Department of Social Services.   CSW will continue to follow and assist as needed.  Sindy Messing, LCSW (Coverage for [employer])

## 2014-04-29 NOTE — Progress Notes (Addendum)
Patient ID: [name], female   DOB: Oct 09, 1927, 80 y.o.   MRN: [id number] [facility] PROGRESS NOTE  KIELI GOLLADAY HUD:[id number] DOB: 17-Feb-1928 DOA: 04/25/2014 PCP: Alesia Richards, MD  Brief narrative: 80 y.o. female with past medical history of hypertension, prior history of MI, TIA, hepatoma who presented to [facility] ED 04/25/2014 status post fall at home. Pt also reported nausea and vomiting and having loose bowel movements but no diarrhea, On admission, x-ray of the lumbar spine and pelvis did not reveal acute fractures. CT head and CT cervical spine did not show acute intracranial findings. Chest x-ray showed no acute cardiopulmonary process. Blood work revealed mild hyponatremia, sodium 136. Creatinine was 1.45 and calcium 10.6. Urinalysis showed only trace leukocytes. Patient was admitted for further evaluation. She was subsequently found to have C.diff enteritis and started on flagyl. Her hospital course is complicated due to worsening abdominal distention which based on CT abdomen is due to ascites. CT abdomen also showed large right sided pleural effusion. She will have paracentesis and thoracentesis doen for further evaluation.   Assessment/Plan:   Principal Problem:  Acute respiratory failure with hypoxia / Right pleural effusion  Large right pleural effusion seen on CT chest. CXR showed large right pleural effusion. Order placed for [place] diagnostic thoracentesis. Give 1 dose of lasix 20 mg IV.  Bronchodilator treatment ordered. Active Problems:  Abdominal ascites  Seen on CT abdomen / pelvis  Obtain US diagnostic paracentesis  Given 1 dose of lasix 20 mg IV (monitor BP)  Dr. Marin Olp to see the pt in consultation tomorrow Fall, weakness, dehydration  Likely due to dehydration. We will continue supportive care with IV fluids. Per PT, SNF recommended  Hypertension  Blood pressure soft on the admission. All antihypertensives on hold except for spironolactone. C.diff  enteritis /Abdominal distention  Started flagyl 04/26/2014; due to ongoing diarrhea flexiseal inserted  Acute renal failure  Likely secondary to dehydration  Improved with IV fluids from 1.45 to 1.29 Hypercalcemia  Likely due to dehydration. Resolved with IV fluids. Moderate protein calorie malnutrition  Nutrition consulted  DVT Prophylaxis  SCD's due to mild thrombocytopenia    Code Status: Full.  Family Communication: plan of care discussed with the patient  Disposition Plan: SNF on discharge  .  IV Access:   Peripheral IV Procedures and diagnostic studies:   Dg Chest 2 View 04/25/2014 IMPRESSION: No acute cardiopulmonary process. Cardiomegaly with mild central vascular congestion reidentified.  Dg Lumbar Spine Complete 04/25/2014 IMPRESSION: No acute osseous abnormality of the lumbar spine  Dg Pelvis 1-2 Views8/11/2013 IMPRESSION: Negative.  Ct Head Wo Contrast 04/25/2014 IMPRESSION: CT head: No acute intracranial process. Normal noncontrast CT of the head for age. CT cervical spine: Straightened cervical lordosis without acute fracture. Grade 1 C5-6 anterolisthesis on degenerative basis.  Ct Cervical Spine Wo Contrast 04/25/2014 IMPRESSION: CT head: No acute intracranial process. Normal noncontrast CT of the head for age. CT cervical spine: Straightened cervical lordosis without acute fracture. Grade 1 C5-6 anterolisthesis on degenerative basis.  Dg Chest Port 1 View 04/25/2014 IMPRESSION: Retrocardiac opacity which could represent atelectasis given the degree of hypoaeration although pneumonia could appear similar. If symptoms persist, consider PA and lateral chest radiographs obtained at full inspiration when the patient is clinically able.  CT abdomen/pelvic 04/28/2014 - Morphologic features of the liver compatible with cirrhosis with stigmata of portal venous hypertension. Increase in abdominal ascites. Hepatoma site within the segment 6 of the liver is slightly increased in size from previous  exam. Large right pleural effusion.  CXR 87/2015 US paracentesis 04/29/2014  Medical Consultants:   Dr. Burney Gauze Other Consultants:   PT evaluation  Nutrition  Anti-Infectives:   Flagyl 04/26/2014 -->  Leisa Lenz, MD  [facility] Pager [phone]  If 7PM-7AM, please contact night-coverage [email or web address] Password [facility] 04/29/2014, 9:55 AM   LOS: 4 days    HPI/Subjective: No acute overnight events. Felt short of breath yesterday afternoon but says she feels better this am.  Objective: Filed Vitals:   04/28/14 0549 04/28/14 1452 04/28/14 2025 04/29/14 0546  BP: 99/56 129/64 126/67 110/58  Pulse: 84 88 104 111  Temp: 98.4 F (36.9 C) 98.3 F (36.8 C) 98.6 F (37 C) 97.9 F (36.6 C)  TempSrc: Oral Oral Oral Oral  Resp: 18 18 18 18   Height:      Weight:      SpO2: 93% 96% 94% 92%    Intake/Output Summary (Last 24 hours) at 04/29/14 0955 Last data filed at 04/29/14 0057  Gross per 24 hour  Intake    870 ml  Output    975 ml  Net   -105 ml    Exam:   General:  Pt is alert, no distress  Cardiovascular: Regular rate and rhythm, S1/S2, no murmurs  Respiratory: wheezing in upper lung lobes, no rhonchi  Abdomen: distended but not much tenderness to palpation.  Extremities: No edema, pulses DP and PT palpable bilaterally  Neuro: Grossly nonfocal  Data Reviewed: Basic Metabolic Panel:  Recent Labs Lab 04/25/14 1706 04/26/14 0404 04/29/14 0445  NA 136* 138 135*  K 3.9 3.6* 3.7  CL 100 103 105  CO2 20 24 19   GLUCOSE 171* 108* 103*  BUN 28* 29* 23  CREATININE 1.45* 1.54* 1.29*  CALCIUM 10.6* 9.8 8.3*   Liver Function Tests:  Recent Labs Lab 04/25/14 1706  AST 61*  ALT 35  ALKPHOS 145*  BILITOT 0.7  PROT 7.2  ALBUMIN 3.1*    Recent Labs Lab 04/25/14 1706  LIPASE 53   No results found for this basename: AMMONIA,  in the last 168 hours CBC:  Recent Labs Lab 04/25/14 1706  WBC 10.5  NEUTROABS 8.9*  HGB 13.9  HCT 41.0  MCV  94.5  PLT 138*   Cardiac Enzymes:  Recent Labs Lab 04/25/14 1706  TROPONINI <0.30   BNP: No components found with this basename: POCBNP,  CBG: No results found for this basename: GLUCAP,  in the last 168 hours  URINE CULTURE     Status: None   Collection Time    04/25/14  6:40 PM      Result Value Ref Range Status   Specimen Description URINE, RANDOM   Final   Value: NO GROWTH     Performed at [facility]   Report Status 04/27/2014 FINAL   Final  CULTURE, BLOOD (ROUTINE X 2)     Status: None   Collection Time    04/25/14  6:46 PM      Result Value Ref Range Status   Specimen Description BLOOD BLOOD RIGHT FOREARM   Final   Value:        BLOOD CULTURE RECEIVED NO GROWTH TO DATE      Performed at [facility]   Report Status PENDING   Incomplete  CULTURE, BLOOD (ROUTINE X 2)     Status: None   Collection Time    04/25/14  6:48 PM      Result Value Ref Range Status  Specimen Description BLOOD RIGHT ANTECUBITAL   Final   Value:        BLOOD CULTURE RECEIVED NO GROWTH TO DATE      Performed at [facility]   Report Status PENDING   Incomplete  STOOL CULTURE     Status: None   Collection Time    04/25/14 11:36 PM      Result Value Ref Range Status   Specimen Description STOMA   Final   Value: NO SALMONELLA, SHIGELLA, CAMPYLOBACTER, YERSINIA, OR E.COLI 0157:H7 ISOLATED     Performed at [facility]   Report Status 04/29/2014 FINAL   Final  URINE CULTURE     Status: None   Collection Time    04/25/14 11:38 PM      Result Value Ref Range Status   Specimen Description URINE, CLEAN CATCH   Final   Value: 50,000 COLONIES/ML   Value: KLEBSIELLA ORNITHINOLYTICA     Performed at [facility]   Report Status 04/28/2014 FINAL   Final   Organism ID, Bacteria KLEBSIELLA ORNITHINOLYTICA   Final  CLOSTRIDIUM DIFFICILE BY PCR     Status: Abnormal   Collection Time    04/26/14  5:19 PM      Result Value Ref Range Status   C difficile by  pcr POSITIVE (*) NEGATIVE Final     Scheduled Meds: . cholecalciferol  1,000 Units Oral Daily  . furosemide  20 mg Intravenous Once  . ipratropium  0.5 mg Nebulization Q6H  . lactose free nutrition  237 mL Oral TID WC  . levalbuterol  1.25 mg Nebulization 4 times per day  . lipase/protease/amylase  24,000 Units Oral TID AC  . metroNIDAZOLE  500 mg Oral 3 times per day  . pantoprazole  80 mg Oral Daily  . spironolactone  50 mg Oral Daily   Continuous Infusions: . sodium chloride 1,000 mL (04/28/14 1759)

## 2014-04-30 DIAGNOSIS — R188 Other ascites: Secondary | ICD-10-CM

## 2014-04-30 DIAGNOSIS — J90 Pleural effusion, not elsewhere classified: Secondary | ICD-10-CM

## 2014-04-30 DIAGNOSIS — C228 Malignant neoplasm of liver, primary, unspecified as to type: Secondary | ICD-10-CM

## 2014-04-30 DIAGNOSIS — E43 Unspecified severe protein-calorie malnutrition: Secondary | ICD-10-CM

## 2014-04-30 DIAGNOSIS — K7689 Other specified diseases of liver: Secondary | ICD-10-CM

## 2014-04-30 LAB — IRON AND TIBC
Iron: 100 ug/dL (ref 42–135)
Saturation Ratios: 58 % — ABNORMAL HIGH (ref 20–55)
TIBC: 173 ug/dL — ABNORMAL LOW (ref 250–470)
UIBC: 73 ug/dL — ABNORMAL LOW (ref 125–400)

## 2014-04-30 LAB — BASIC METABOLIC PANEL
Anion gap: 12 (ref 5–15)
BUN: 25 mg/dL — AB (ref 6–23)
CALCIUM: 8.6 mg/dL (ref 8.4–10.5)
CO2: 19 meq/L (ref 19–32)
CREATININE: 1.32 mg/dL — AB (ref 0.50–1.10)
Chloride: 107 mEq/L (ref 96–112)
GFR calc Af Amer: 42 mL/min — ABNORMAL LOW (ref >90)
GFR calc non Af Amer: 36 mL/min — ABNORMAL LOW (ref >90)
GLUCOSE: 93 mg/dL (ref 70–99)
Potassium: 3.5 mEq/L — ABNORMAL LOW (ref 3.7–5.3)
Sodium: 138 mEq/L (ref 137–147)

## 2014-04-30 LAB — CBC
HCT: 37 % (ref 36.0–46.0)
Hemoglobin: 12.7 g/dL (ref 12.0–15.0)
MCH: 31.5 pg (ref 26.0–34.0)
MCHC: 34.3 g/dL (ref 30.0–36.0)
MCV: 91.8 fL (ref 78.0–100.0)
Platelets: 133 10*3/uL — ABNORMAL LOW (ref 150–400)
RBC: 4.03 MIL/uL (ref 3.87–5.11)
RDW: 13.4 % (ref 11.5–15.5)
WBC: 7.3 10*3/uL (ref 4.0–10.5)

## 2014-04-30 LAB — PROTIME-INR
INR: 1.35 (ref 0.00–1.49)
PROTHROMBIN TIME: 16.7 s — AB (ref 11.6–15.2)

## 2014-04-30 LAB — PREALBUMIN: Prealbumin: 5.2 mg/dL — ABNORMAL LOW (ref 17.0–34.0)

## 2014-04-30 LAB — FERRITIN: FERRITIN: 276 ng/mL (ref 10–291)

## 2014-04-30 MED ORDER — FAT EMULSION 20 % IV EMUL
250.0000 mL | INTRAVENOUS | Status: AC
  Administered 2014-04-30: 250 mL via INTRAVENOUS
  Filled 2014-04-30: qty 250

## 2014-04-30 MED ORDER — SODIUM CHLORIDE 0.9 % IJ SOLN
10.0000 mL | INTRAMUSCULAR | Status: DC | PRN
  Administered 2014-05-06: 10 mL

## 2014-04-30 MED ORDER — SODIUM CHLORIDE 0.9 % IJ SOLN
10.0000 mL | Freq: Two times a day (BID) | INTRAMUSCULAR | Status: DC
  Administered 2014-05-01 – 2014-05-10 (×18): 10 mL

## 2014-04-30 MED ORDER — SODIUM CHLORIDE 0.9 % IV SOLN: INTRAVENOUS | Status: DC

## 2014-04-30 MED ORDER — TRACE MINERALS CR-CU-F-FE-I-MN-MO-SE-ZN IV SOLN
INTRAVENOUS | Status: AC
  Administered 2014-04-30: 18:00:00 via INTRAVENOUS
  Filled 2014-04-30: qty 1000

## 2014-04-30 NOTE — Progress Notes (Addendum)
Patient ID: [name], female   DOB: 01/25/1928, 80 y.o.   MRN: [id number] [facility] PROGRESS NOTE  [name] LZJ:[id number] DOB: 1928-01-23 DOA: 04/25/2014 PCP: Alesia Richards, MD  Brief narrative: 80 y.o. female with past medical history of hypertension, prior history of MI, TIA, hepatoma who presented to [facility] ED 04/25/2014 status post fall at home. Pt also reported nausea and vomiting and having loose bowel movements but no diarrhea, On admission, x-ray of the lumbar spine and pelvis did not reveal acute fractures. CT head and CT cervical spine did not show acute intracranial findings. Chest x-ray showed no acute cardiopulmonary process. Blood work revealed mild hyponatremia, sodium 136. Creatinine was 1.45 and calcium 10.6. Urinalysis showed only trace leukocytes. Patient was admitted for further evaluation. She was subsequently found to have C.diff enteritis and started on flagyl. Her hospital course is complicated due to worsening abdominal distention which based on CT abdomen is due to ascites however minimal ascites was seen on US abdomen.  CT abdomen also showed large right sided pleural effusion. Pt underwent thoracentesis 04/29/2014 with 1.2 L fluid drained and sent for analysis.  Assessment/Plan:   Principal Problem:  Acute respiratory failure with hypoxia / Right pleural effusion  Large right pleural effusion seen on CT chest. CXR confirmed large right pleural effusion. Pt underwent right US thoracentesis with 1.2 L fluid drained. Will follow up on fluid analysis results.  Given 1 dose of lasix 20 mg IV 04/29/2014. Bronchodilator treatment ordered. Respiratory status is better this am. Active Problems:  Abdominal ascites   Seen on CT abdomen / pelvis   Given 1 dose of lasix 20 mg IV (monitor BP)  Ascite not seen on abd [place] so paracentesis not done Fall, weakness, dehydration  Likely due to dehydration. We will continue supportive care with IV fluids. Per PT, SNF  recommended  Hypertension  Blood pressure soft on the admission. All antihypertensives on hold except for spironolactone. C.diff enteritis /Abdominal distention  Started flagyl 04/26/2014; due to ongoing diarrhea flexiseal inserted  Acute renal failure  Likely secondary to dehydration  Improved with IV fluids from 1.45 to 1.29 to 1.32 Hypercalcemia  Likely due to dehydration. Resolved with IV fluids. Moderate protein calorie malnutrition  Nutrition consulted  DVT Prophylaxis  SCD's due to mild thrombocytopenia    Code Status: Full.  Family Communication: plan of care discussed with the patient  Disposition Plan: SNF on discharge    IV Access:   Peripheral IV Procedures and diagnostic studies:   Dg Chest 2 View 04/25/2014 IMPRESSION: No acute cardiopulmonary process. Cardiomegaly with mild central vascular congestion reidentified.  Dg Lumbar Spine Complete 04/25/2014 IMPRESSION: No acute osseous abnormality of the lumbar spine  Dg Pelvis 1-2 Views8/11/2013 IMPRESSION: Negative.  Ct Head Wo Contrast 04/25/2014 IMPRESSION: CT head: No acute intracranial process. Normal noncontrast CT of the head for age. CT cervical spine: Straightened cervical lordosis without acute fracture. Grade 1 C5-6 anterolisthesis on degenerative basis.  Ct Cervical Spine Wo Contrast 04/25/2014 IMPRESSION: CT head: No acute intracranial process. Normal noncontrast CT of the head for age. CT cervical spine: Straightened cervical lordosis without acute fracture. Grade 1 C5-6 anterolisthesis on degenerative basis.  Dg Chest Port 1 View 04/25/2014 IMPRESSION: Retrocardiac opacity which could represent atelectasis given the degree of hypoaeration although pneumonia could appear similar. If symptoms persist, consider PA and lateral chest radiographs obtained at full inspiration when the patient is clinically able.  Dg Chest 1 View 04/29/2014  IMPRESSION: Marked improvement RIGHT  pleural effusion.  No pneumothorax.   Dg Chest Port 1  View 04/29/2014  Significant increase in a right-sided pleural effusion when compared with the prior exam.  Hiatal hernia.   US Thoracentesis Asp Pleural Space W/img Guide 04/29/2014  Successful ultrasound guided diagnostic and therapeutic right thoracentesis yielding 1.2 liters of pleural fluid.  Read by: Rowe Robert, PA-C  Medical Consultants:   [facility] (Dr. Burney Gauze)  Other Consultants:   Nutrition   Anti-Infectives:   Flagyl 04/26/2014 -->   Leisa Lenz, MD  [facility] Pager [phone]  If 7PM-7AM, please contact night-coverage [email or web address] Password [facility] 04/30/2014, 6:41 AM   LOS: 5 days    HPI/Subjective: No acute overnight events.  Objective: Filed Vitals:   04/29/14 1400 04/29/14 1430 04/29/14 2054 04/30/14 0552  BP: 127/60 110/52 130/55 121/54  Pulse: 88  91 79  Temp: 98.6 F (37 C)  98.1 F (36.7 C) 98.4 F (36.9 C)  TempSrc: Oral  Oral Oral  Resp: 18  18 16   Height:      Weight:      SpO2: 93%  93% 96%    Intake/Output Summary (Last 24 hours) at 04/30/14 0641 Last data filed at 04/29/14 2055  Gross per 24 hour  Intake    605 ml  Output    950 ml  Net   -345 ml    Exam:   General:  Pt is alert, follows commands appropriately, not in acute distress  Cardiovascular: Regular rate and rhythm, S1/S2, no murmurs  Respiratory: wheezing in upper lobes, no rhonchi  Abdomen: softly distended, minimally tender to palpation, BS appreciated   Extremities: No edema, pulses DP and PT palpable bilaterally  Neuro: Grossly nonfocal  Data Reviewed: Basic Metabolic Panel:  Recent Labs Lab 04/25/14 1706 04/26/14 0404 04/29/14 0445 04/30/14 0543  NA 136* 138 135* PENDING  K 3.9 3.6* 3.7 PENDING  CL 100 103 105 PENDING  CO2 20 24 19  PENDING  GLUCOSE 171* 108* 103* PENDING  BUN 28* 29* 23 25*  CREATININE 1.45* 1.54* 1.29* PENDING  CALCIUM 10.6* 9.8 8.3* 8.6   Liver Function Tests:  Recent Labs Lab 04/25/14 1706  AST 61*  ALT 35   ALKPHOS 145*  BILITOT 0.7  PROT 7.2  ALBUMIN 3.1*    Recent Labs Lab 04/25/14 1706  LIPASE 53   No results found for this basename: AMMONIA,  in the last 168 hours CBC:  Recent Labs Lab 04/25/14 1706  WBC 10.5  NEUTROABS 8.9*  HGB 13.9  HCT 41.0  MCV 94.5  PLT 138*   Cardiac Enzymes:  Recent Labs Lab 04/25/14 1706  TROPONINI <0.30   BNP: No components found with this basename: POCBNP,  CBG: No results found for this basename: GLUCAP,  in the last 168 hours  Recent Results (from the past 240 hour(s))  URINE CULTURE     Status: None   Collection Time    04/25/14  6:40 PM      Result Value Ref Range Status   Specimen Description URINE, RANDOM   Final   Special Requests NONE   Final   Culture  Setup Time     Final   Value: 04/26/2014 05:55     Performed at [place]     Final   Value: NO GROWTH     Performed at [place]     Final   Value: NO GROWTH     Performed  at [facility]   Report Status 04/27/2014 FINAL   Final  CULTURE, BLOOD (ROUTINE X 2)     Status: None   Collection Time    04/25/14  6:46 PM      Result Value Ref Range Status   Specimen Description BLOOD BLOOD RIGHT FOREARM   Final   Special Requests BOTTLES DRAWN AEROBIC AND ANAEROBIC 3ML   Final   Culture  Setup Time     Final   Value: 04/25/2014 22:52     Performed at [facility]   Culture     Final   Value:        BLOOD CULTURE RECEIVED NO GROWTH TO DATE CULTURE WILL BE HELD FOR 5 DAYS BEFORE ISSUING A FINAL NEGATIVE REPORT     Performed at [facility]   Report Status PENDING   Incomplete  CULTURE, BLOOD (ROUTINE X 2)     Status: None   Collection Time    04/25/14  6:48 PM      Result Value Ref Range Status   Specimen Description BLOOD RIGHT ANTECUBITAL   Final   Special Requests BOTTLES DRAWN AEROBIC AND ANAEROBIC 3ML   Final   Culture  Setup Time     Final   Value: 04/25/2014 22:51     Performed at [facility]   Culture     Final   Value:        BLOOD CULTURE RECEIVED NO GROWTH TO DATE CULTURE WILL BE HELD FOR 5 DAYS BEFORE ISSUING A FINAL NEGATIVE REPORT     Performed at [facility]   Report Status PENDING   Incomplete  STOOL CULTURE     Status: None   Collection Time    04/25/14 11:36 PM      Result Value Ref Range Status   Specimen Description STOMA   Final   Special Requests Normal   Final   Culture     Final   Value: NO SALMONELLA, SHIGELLA, CAMPYLOBACTER, YERSINIA, OR E.COLI 0157:H7 ISOLATED     Performed at [facility]   Report Status 04/29/2014 FINAL   Final  URINE CULTURE     Status: None   Collection Time    04/25/14 11:38 PM      Result Value Ref Range Status   Specimen Description URINE, CLEAN CATCH   Final   Special Requests Normal   Final   Culture  Setup Time     Final   Value: 04/26/2014 05:55     Performed at [place]     Final   Value: 50,000 COLONIES/ML     Performed at [facility]   Culture     Final   Value: KLEBSIELLA ORNITHINOLYTICA     Performed at [facility]   Report Status 04/28/2014 FINAL   Final   Organism ID, Bacteria KLEBSIELLA ORNITHINOLYTICA   Final  CLOSTRIDIUM DIFFICILE BY PCR     Status: Abnormal   Collection Time    04/26/14  5:19 PM      Result Value Ref Range Status   C difficile by pcr POSITIVE (*) NEGATIVE Final   Comment: CRITICAL RESULT CALLED TO, READ BACK BY AND VERIFIED WITH:     Gay Filler RN 04/27/14 0734 COSTELLO B     Performed at [place] FLUID CULTURE     Status: None   Collection Time    04/29/14  2:33 PM      Result Value Ref Range Status   Specimen Description FLUID RIGHT PLEURAL EFFUSION   Final   Special Requests Normal   Final   Gram Stain     Final   Value: NO WBC SEEN     NO ORGANISMS SEEN     Performed at [facility]   Culture PENDING   Incomplete   Report Status PENDING   Incomplete     Scheduled Meds: .  cholecalciferol  1,000 Units Oral Daily  . lactose free nutrition  237 mL Oral TID WC  . lipase/protease/amylase  24,000 Units Oral TID AC  . metroNIDAZOLE  500 mg Oral 3 times per day  . pantoprazole  80 mg Oral Daily  . spironolactone  50 mg Oral Daily   Continuous Infusions:

## 2014-04-30 NOTE — Consult Note (Signed)
NAMEJENASCIA, [name]                  ACCOUNT NO.:  [id number]  MEDICAL RECORD NO.:  [id number]  LOCATION:  3086                         FACILITY:  [facility]  PHYSICIAN:  Volanda Napoleon, M.D.  DATE OF BIRTH:  1928/04/12  DATE OF CONSULTATION:  04/30/2014 DATE OF DISCHARGE:                                CONSULTATION   REFERRING PHYSICIAN:  Leisa Lenz, M.D.  REASON FOR CONSULTATION: 1. History of hepatocellular carcinoma. 2. Ascites/pleural effusion.  HISTORY OF PRESENT ILLNESS:  [name] is a very nice 80 year old, white female.  I have known her for many years.  I see her typically for iron- deficiency anemia, which has been chronic.  This has been from intermittent GI bleeding from gastrointestinal AV malformations and angiodysplasia.  She was found to have a hepatocellular carcinoma a couple of years ago. She has chronic active hepatitis with steatosis, with advanced fibrosis.   She has had a radiofrequency ablation procedure.  This was done in May of 2014.  This was fairly successful.  She has had no subsequent problems from this.  She has been hospitalized on occasion.  She has syncopal episode back in December of this past year.  She has been hospitalized in March of this year.  She had leg pain in the right leg.  I think she may have had some cellulitis.  This has been declining.  She has been going to the emergency room.  We saw her in the office back on April 14, 2014.  She clearly looked dehydrated.  She is having diarrhea.  We gave her some IV fluids in the office.  I did give her prescription for Flagyl.  She got some pancreatic enzyme replacement.  Unfortunately, she ended up hospitalized after this.  I think she had cellulitis of the left leg.  She was given antibiotics with Keflex.  She was then found at home on the floor.  This was on April 25, 2014. She was admitted.  She was found to have Clostridium difficile infection.  She underwent scans.  On  admission, her hemoglobin was 13.9, hematocrit 41, platelet count 138, and white cell count 10.5.  Her albumin was 3.1. BUN 28, creatinine 1.45, calcium 10.6, sodium 136.  Again, she was positive for C. diff.  She has had lot of diarrhea.  She really is not eating much.  She started to have ascites.  A CT of the abdomen and pelvis was done on April 28, 2014.  This showed liver lesion in the right lobe of the liver.  It measured 2.5 cm.  It previously measured 2.1 cm.  She had a moderate right pleural effusion.  She had portal hypertension.  She has abdominal ascites.  She had large right hiatal hernia.  She has some retroperitoneal lymphadenopathy.  She had a thoracentesis done with a pleural fluid on the right side. She had 1.2 liters of fluid removed from the right lung.  I do not think she has had a paracentesis as of yet.  We were asked to see her to try to help with management.  She is not eating.  This is my biggest concern.  She is elderly.  She is frail.  She clearly is not capable of being home right now.  With the C. diff diarrhea, if she tries to eat, she says that the food goes "threw me."  As such, I really think that TNA is going to help her out.  She finally needs to have a paracentesis done.  Again, we will  have to think that this is going to be a transudate.  She has  cirrhosis. I think the stress on her liver from the C. diff is probably the cause of some decompensation of liver function.  I do not believe that the hepatic tumor is a problem.  It is 2.5 cm, which really is not that large.  There is nothing else in the liver that suggest progression.  PAST MEDICAL HISTORY:  [name] past medical history is well documented. 1. She has the iron-deficiency anemia with recurrent gastrointestinal     blood loss. 2. Hepatoma, status post radiofrequency ablation. 3. NASH. 4. History of TIA. 5. GERD. 6. Hypertension. 7. Arthritis.  ALLERGIES: 1.  AVELOX. 2. STATINS. 3. SULFA ANTIBIOTICS. 4. ACE INHIBITORS. 5. DOXYCYCLINE. 6. VANCOMYCIN. 7. CIPRO. 8. FENOFIBRATE.  HOME MEDICATIONS: 1. Xanax 0.5 mg p.o. at bedtime p.r.n. 2. Tenormin 50 mg p.o. daily. 3. Keflex 500 mg p.o. t.i.d. 4. Vitamin D 1000 units p.o. daily. 5. Flexeril 10 mg p.o. b.i.d. 6. Lasix 40 mg p.o. daily. 7. Prilosec 40 mg p.o. daily. 8. Creon 24,000 units capsule p.o. t.i.d. before meals. 9. Aldactone 50 mg p.o. q.a.m. 10.Probiotic daily.  SOCIAL HISTORY:  Negative for tobacco or alcohol use.  PHYSICAL EXAMINATION:  GENERAL:  This is an elderly, somewhat frail, white female, in no obvious distress. VITAL SIGNS:  Temperature of 98.4, pulse 79, blood pressure 121/54. HEAD AND NECK:  Shows some temporal muscle wasting.  She has no scleral icterus.  There is no oral lesions.  Oral mucosa is slightly dry. NECK:  There is no adenopathy in her neck. LUNGS:  With some slight decrease on the right side.  She has occasional wheeze. CARDIAC:  Regular rate and rhythm with no murmurs, rubs, or bruits. ABDOMEN:  Soft.  She is slightly distended.  She does have a fluid wave. There is no guarding or rebound tenderness.  There is no palpable liver. Spleen tip might be palpable with deep inspiration. EXTREMITIES:  Show muscle atrophy in upper and lower extremities.  She has age-related osteoarthritic changes in her joints. NEUROLOGICAL:  Nonfocal.  LABORATORY DATA:  Today show a sodium of 138, potassium 3.5, BUN 25, creatinine 1.32.  IMPRESSION:  [name] is an 80 year old female.  She has history of hepatoma.  This was ablated with radiofrequency ablation a year ago. Again, I do not believe this is an active problem.  I have to believe that the pleural effusion and ascites is probably from cirrhosis.  She has nonalcoholic steatohepatitis.  She has hepatic decompensation, from my mind, from the Clostridium difficile.  I would be shocked if the fluid was positive  for malignancy or for bacteria.  I really think that nutrition is a problem.  I really believe that she has severe protein-calorie malnutrition.  I will repeat her albumin.  I will repeat her pre-albumin.  I have to believe that these are going to be quite low.  She is really not eating.  I do not see that she is going to get better without nutrition.  I think that she would benefit from TNA right now. I just  do not think she is going to take in enough calories by mouth to help herself.  She just does not have an appetite.  She just is not going to eat much.  Whatever she eats seems to go through her very quickly.  I talked to her and her granddaughter about nutritional supplementation with the TNA.  I explained to them how it works.  I explained why I thought it would be helpful for her.  She agrees to do this.  We will get a PICC line put into her.  Again, I really think that nutritional supplementation is going to be very helpful for her.  I am going to repeat her CBC.  We will see what her iron studies show. It would not surprise me if she is borderline iron or actually iron deficient.  If so, we will give her IV iron back.  We will follow her along.     Volanda Napoleon, M.D.     PRE/MEDQ  D:  04/30/2014  T:  04/30/2014  Job:  [id number]

## 2014-04-30 NOTE — Progress Notes (Signed)
Peripherally Inserted Central Catheter/Midline Placement  The IV Nurse has discussed with the patient and/or persons authorized to consent for the patient, the purpose of this procedure and the potential benefits and risks involved with this procedure.  The benefits include less needle sticks, lab draws from the catheter and patient may be discharged home with the catheter.  Risks include, but not limited to, infection, bleeding, blood clot (thrombus formation), and puncture of an artery; nerve damage and irregular heat beat.  Alternatives to this procedure were also discussed.  PICC/Midline Placement Documentation  PICC / Midline Double Lumen 04/30/14 PICC Right Brachial 36 cm 1 cm (Active)  Indication for Insertion or Continuance of Line Administration of hyperosmolar/irritating solutions (i.e. TPN, Vancomycin, etc.) 04/30/2014  2:52 PM  Exposed Catheter (cm) 1 cm 04/30/2014  2:52 PM  Site Assessment Clean;Dry;Intact 04/30/2014  2:52 PM  Lumen #1 Status Flushed;Saline locked;Blood return noted 04/30/2014  2:52 PM  Lumen #2 Status Flushed;Saline locked;Blood return noted 04/30/2014  2:52 PM  Dressing Type Transparent 04/30/2014  2:52 PM  Dressing Status Clean;Dry;Intact;Antimicrobial disc in place 04/30/2014  2:52 PM  Line Care Connections checked and tightened 04/30/2014  2:52 PM  Line Adjustment (NICU/IV Team Only) No 04/30/2014  2:52 PM  Dressing Intervention New dressing 04/30/2014  2:52 PM  Dressing Change Due 05/07/14 04/30/2014  2:52 PM       [name] 04/30/2014, 2:53 PM

## 2014-04-30 NOTE — Consult Note (Signed)
#   [id number] is consult note.  Lum Keas

## 2014-04-30 NOTE — Progress Notes (Signed)
PARENTERAL NUTRITION CONSULT NOTE - INITIAL  Pharmacy Consult for TNA Indication: malnutrition PTA/CDiff  Allergies  Allergen Reactions  . Avelox [Moxifloxacin Hcl In Nacl] Anaphylaxis  . Statins Other (See Comments)    Liver function elevations  . Sulfa Antibiotics Hives and Itching  . Ace Inhibitors     Unknown  . Doxycycline     Unknown  . Infed [Iron Dextran] Other (See Comments)    IV IRON, Unknown  . Infergen [Interferon Alfacon-1]   . Vancomycin Other (See Comments)    Flushing, erythema to neck and face, tolerated vanc at lower infusion rate  . Ciprofloxacin Other (See Comments)    Complains of sore mouth  . Fenofibrate Other (See Comments)    constipation   Patient Measurements: Height: 5\' 6"  (167.6 cm) Weight: 171 lb 8 oz (77.792 kg) IBW/kg (Calculated) : 59.3  Vital Signs: Temp: 98.4 F (36.9 C) (08/08 0552) Temp src: Oral (08/08 0552) BP: 121/54 mmHg (08/08 0552) Pulse Rate: 79 (08/08 0552) Intake/Output from previous day: 08/07 0701 - 08/08 0700 In: 605 [P.O.:605] Out: 950 [Urine:950] Intake/Output from this shift:    Labs:  Recent Labs  04/30/14 0956  WBC 7.3  HGB 12.7  HCT 37.0  PLT 133*  INR 1.35    Recent Labs  04/29/14 0445 04/30/14 0543  NA 135* 138  K 3.7 3.5*  CL 105 107  CO2 19 19  GLUCOSE 103* 93  BUN 23 25*  CREATININE 1.29* 1.32*  CALCIUM 8.3* 8.6   Estimated Creatinine Clearance: 34 ml/min (by C-G formula based on Cr of 1.32).   No results found for this basename: GLUCAP,  in the last 72 hours  Medical History: Past Medical History  Diagnosis Date  . Anemia, iron deficiency 08/07/2011  . Angiodysplasia of intestinal tract 08/07/2011  . Hypertension   . Arthritis   . Diverticulitis   . Phlebitis   . Macular degeneration   . TIA (transient ischemic attack)   . MI (myocardial infarction)   . Hiatal hernia   . AVM (arteriovenous malformation) of colon with hemorrhage   . Varicose veins of legs   . Cataract    . GERD (gastroesophageal reflux disease)   . DJD (degenerative joint disease)   . Diabetes mellitus     borderline  . History of blood transfusion   . PONV (postoperative nausea and vomiting)   . hepatocellar ca dx'd 08/2012  . Edema leg   . Fatty liver   . Vitamin D deficiency    Medications:  Scheduled:  . cholecalciferol  1,000 Units Oral Daily  . lactose free nutrition  237 mL Oral TID WC  . lipase/protease/amylase  24,000 Units Oral TID AC  . metroNIDAZOLE  500 mg Oral 3 times per day  . pantoprazole  80 mg Oral Daily  . spironolactone  50 mg Oral Daily   Infusions:    Insulin Requirements in the past 24 hours:  None ordered, hx of DM-diet controlled  Current Nutrition:  Carb-modified diet from 8/4  Assessment: 84 yoF admitted 8/3 after LOC at home with hx of nausea/loose stools at home. Previous admit 7/26 for cellulitis; received Rocephin x 1 dose, Vancomycin x 3 doses, discharged on po Keflex. CDiff culture ordered 7/26, never obtained. Poor po intake PTA, 10 lb weight loss noted.   Clinimix E 5/15 + Lipids 20% at 10 ml/hr would deliver 84 gm protein, 1670 kCal/24 hr  Nutritional Goals:  1550-1750 kCal, 75-85 grams of protein per day,  Dietitian consult 8/5  Plan:   PICC line to be placed today  Would begin Clinimix E 5/15 at 40 ml/hr  MVI and Trace elements daily  Advance to goal rate as tolerated  Add NS at 10 ml/hr for peripheral access  Minda Ditto PharmD Pager [phone] 04/30/2014, 1:23 PM

## 2014-05-01 ENCOUNTER — Inpatient Hospital Stay (HOSPITAL_COMMUNITY): Payer: Medicare Other

## 2014-05-01 DIAGNOSIS — K766 Portal hypertension: Secondary | ICD-10-CM

## 2014-05-01 LAB — DIFFERENTIAL
BASOS ABS: 0.1 10*3/uL (ref 0.0–0.1)
Basophils Relative: 1 % (ref 0–1)
Eosinophils Absolute: 0.6 10*3/uL (ref 0.0–0.7)
Eosinophils Relative: 8 % — ABNORMAL HIGH (ref 0–5)
Lymphocytes Relative: 27 % (ref 12–46)
Lymphs Abs: 2 10*3/uL (ref 0.7–4.0)
MONOS PCT: 10 % (ref 3–12)
Monocytes Absolute: 0.7 10*3/uL (ref 0.1–1.0)
NEUTROS ABS: 4.2 10*3/uL (ref 1.7–7.7)
NEUTROS PCT: 56 % (ref 43–77)

## 2014-05-01 LAB — COMPREHENSIVE METABOLIC PANEL
ALBUMIN: 2.3 g/dL — AB (ref 3.5–5.2)
ALK PHOS: 111 U/L (ref 39–117)
ALT: 26 U/L (ref 0–35)
ANION GAP: 9 (ref 5–15)
AST: 36 U/L (ref 0–37)
BUN: 25 mg/dL — AB (ref 6–23)
CALCIUM: 8.6 mg/dL (ref 8.4–10.5)
CO2: 22 mEq/L (ref 19–32)
Chloride: 107 mEq/L (ref 96–112)
Creatinine, Ser: 1.18 mg/dL — ABNORMAL HIGH (ref 0.50–1.10)
GFR calc Af Amer: 48 mL/min — ABNORMAL LOW (ref >90)
GFR calc non Af Amer: 41 mL/min — ABNORMAL LOW (ref >90)
Glucose, Bld: 124 mg/dL — ABNORMAL HIGH (ref 70–99)
Potassium: 3.4 mEq/L — ABNORMAL LOW (ref 3.7–5.3)
SODIUM: 138 meq/L (ref 137–147)
TOTAL PROTEIN: 5.7 g/dL — AB (ref 6.0–8.3)
Total Bilirubin: 0.6 mg/dL (ref 0.3–1.2)

## 2014-05-01 LAB — PHOSPHORUS: PHOSPHORUS: 2.5 mg/dL (ref 2.3–4.6)

## 2014-05-01 LAB — CULTURE, BLOOD (ROUTINE X 2)
Culture: NO GROWTH
Culture: NO GROWTH

## 2014-05-01 LAB — TRIGLYCERIDES: TRIGLYCERIDES: 55 mg/dL (ref <150)

## 2014-05-01 LAB — CBC
HCT: 34.8 % — ABNORMAL LOW (ref 36.0–46.0)
HEMOGLOBIN: 11.9 g/dL — AB (ref 12.0–15.0)
MCH: 31.7 pg (ref 26.0–34.0)
MCHC: 34.2 g/dL (ref 30.0–36.0)
MCV: 92.8 fL (ref 78.0–100.0)
Platelets: 134 10*3/uL — ABNORMAL LOW (ref 150–400)
RBC: 3.75 MIL/uL — ABNORMAL LOW (ref 3.87–5.11)
RDW: 13.6 % (ref 11.5–15.5)
WBC: 7.6 10*3/uL (ref 4.0–10.5)

## 2014-05-01 LAB — GLUCOSE, CAPILLARY: Glucose-Capillary: 122 mg/dL — ABNORMAL HIGH (ref 70–99)

## 2014-05-01 LAB — PREALBUMIN: Prealbumin: 5.6 mg/dL — ABNORMAL LOW (ref 17.0–34.0)

## 2014-05-01 LAB — MAGNESIUM: MAGNESIUM: 2 mg/dL (ref 1.5–2.5)

## 2014-05-01 MED ORDER — TRACE MINERALS CR-CU-F-FE-I-MN-MO-SE-ZN IV SOLN
INTRAVENOUS | Status: AC
  Administered 2014-05-01: 17:00:00 via INTRAVENOUS
  Filled 2014-05-01: qty 1000

## 2014-05-01 MED ORDER — INSULIN ASPART 100 UNIT/ML ~~LOC~~ SOLN: 0.0000 [IU] | Freq: Three times a day (TID) | SUBCUTANEOUS | Status: DC

## 2014-05-01 MED ORDER — DEXTROSE 5 % IV SOLN
20.0000 mmol | Freq: Once | INTRAVENOUS | Status: AC
  Administered 2014-05-01: 20 mmol via INTRAVENOUS
  Filled 2014-05-01: qty 6.67

## 2014-05-01 MED ORDER — BENZONATATE 100 MG PO CAPS
100.0000 mg | ORAL_CAPSULE | Freq: Two times a day (BID) | ORAL | Status: DC
  Administered 2014-05-01 – 2014-05-09 (×18): 100 mg via ORAL
  Filled 2014-05-01 (×24): qty 1

## 2014-05-01 MED ORDER — INSULIN ASPART 100 UNIT/ML ~~LOC~~ SOLN
0.0000 [IU] | Freq: Three times a day (TID) | SUBCUTANEOUS | Status: DC
  Administered 2014-05-01: 1 [IU] via SUBCUTANEOUS
  Administered 2014-05-02: 3 [IU] via SUBCUTANEOUS
  Administered 2014-05-03: 1 [IU] via SUBCUTANEOUS
  Administered 2014-05-03: 2 [IU] via SUBCUTANEOUS
  Administered 2014-05-03: 1 [IU] via SUBCUTANEOUS
  Administered 2014-05-04: 2 [IU] via SUBCUTANEOUS
  Administered 2014-05-04: 1 [IU] via SUBCUTANEOUS
  Administered 2014-05-04: 2 [IU] via SUBCUTANEOUS
  Administered 2014-05-05: 1 [IU] via SUBCUTANEOUS
  Administered 2014-05-05 – 2014-05-06 (×4): 2 [IU] via SUBCUTANEOUS
  Administered 2014-05-07: 1 [IU] via SUBCUTANEOUS
  Administered 2014-05-07: 2 [IU] via SUBCUTANEOUS
  Administered 2014-05-08 (×2): 1 [IU] via SUBCUTANEOUS

## 2014-05-01 MED ORDER — MORPHINE SULFATE 2 MG/ML IJ SOLN
1.0000 mg | INTRAMUSCULAR | Status: DC | PRN
  Administered 2014-05-01 – 2014-05-10 (×9): 1 mg via INTRAVENOUS
  Filled 2014-05-01 (×9): qty 1

## 2014-05-01 MED ORDER — LIP MEDEX EX OINT
TOPICAL_OINTMENT | CUTANEOUS | Status: DC | PRN
  Filled 2014-05-01: qty 7

## 2014-05-01 MED ORDER — BOOST PLUS PO LIQD
237.0000 mL | Freq: Three times a day (TID) | ORAL | Status: DC
  Administered 2014-05-01 – 2014-05-05 (×12): 237 mL via ORAL
  Filled 2014-05-01 (×17): qty 237

## 2014-05-01 MED ORDER — FAT EMULSION 20 % IV EMUL
250.0000 mL | INTRAVENOUS | Status: AC
  Administered 2014-05-01: 250 mL via INTRAVENOUS
  Filled 2014-05-01 (×2): qty 250

## 2014-05-01 NOTE — Progress Notes (Signed)
She now has [name]. A PICC line was placed in the right upper arm.  Her pre-albumin is only 5.2. This is incredibly low. This is part of why she has the pleural effusion and the ascites.  Her abdomen looks a little more distended. I suspect she probably has some ascites there. This probably could be removed. It may make her feel a lobe of better.  There's been no bleeding.  She has a rectal tube in. She's not sure when she goes to the bathroom. It is hard to say if she still having a lot of diarrhea.  She is on Flagyl for the C. difficile.  Hopefully, physical therapy will continue to work with her.    Her vital signs are stable. Blood pressure 116/63. Temperature 97.7. Her lungs are clear. Cardiac exam regular rate and rhythm with an occasional ectopy. She is a 1/6 systolic murmur. Abdomen is somewhat distended. Abdomen is soft. There is a fluid wave. She has no guarding or rebound tenderness. She has high per act of a bowel sounds. Shows no mass there is no palpable liver or spleen. External he shows some trace edema in her lower legs.  Her iron studies look okay. I don't think she needs any IV iron right now.  Her oral intake is very poor. Hopefully this will increase. I will like to think that the C. difficile is improving.  I don't think there is any way she will to go home once she is medically ready. She probably will have to go to rehabilitation.  We will see about getting some ascites removed. I have to believe this is a transudate and is from the cirrhosis and portal hypertension.  Pete E.  Rodman Key 7:7

## 2014-05-01 NOTE — Progress Notes (Addendum)
Patient ID: [name], female   DOB: 1928/07/31, 80 y.o.   MRN: [id number] [facility] PROGRESS NOTE  [name] HYI:[id number] DOB: Sep 23, 1928 DOA: 04/25/2014 PCP: Alesia Richards, MD  Brief narrative: 80 y.o. female with past medical history of hypertension, prior history of MI, TIA, hepatoma who presented to [facility] ED 04/25/2014 status post fall at home. Pt also reported nausea and vomiting and having loose bowel movements but no diarrhea, On admission, x-ray of the lumbar spine and pelvis did not reveal acute fractures. CT head and CT cervical spine did not show acute intracranial findings. Chest x-ray showed no acute cardiopulmonary process. Blood work revealed mild hyponatremia, sodium 136. Creatinine was 1.45 and calcium 10.6. Urinalysis showed only trace leukocytes. Patient was admitted for further evaluation. She was subsequently found to have C.diff enteritis and started on flagyl. Her hospital course is complicated due to worsening abdominal distention which based on CT abdomen is due to ascites however minimal ascites was seen on US abdomen. CT abdomen also showed large right sided pleural effusion. Pt underwent thoracentesis 04/29/2014 with 1.2 L fluid drained and sent for analysis.   Assessment/Plan:   Principal Problem:  Acute respiratory failure with hypoxia / Right pleural effusion  Large right pleural effusion seen on CT chest. CXR confirmed large right pleural effusion. Pt underwent right US thoracentesis with 1.2 L fluid drained. Cytology pending. Fluid culture so far no growth. Given 1 dose of lasix 20 mg IV 04/29/2014. She says she feels better but now has cough. We will give tessalon pearls to see if any improvement. Bronchodilator treatment ordered.   Active Problems:  Abdominal ascites   Seen on CT abdomen / pelvis but not on abdominal US. Per IR, not enough to drain.  Will see if repeat US shows any accumulation. Fall, weakness  Likely due to dehydration. Per PT  evaluation, pt requires SNFon discharge and she and her family are agreeable to SNF placement.  Hypertension   Blood pressure soft on the admission. All antihypertensives on hold except for spironolactone. C.diff enteritis /Abdominal distention   Started flagyl 04/26/2014; due to ongoing diarrhea flexiseal inserted  Acute renal failure   Likely secondary to dehydration   Improved with IV fluids from 1.45 to 1.18 Hypokalemia  repleted  Hypercalcemia   Likely due to dehydration. Resolved with IV fluids. Moderate protein calorie malnutrition   Nutrition consulted   Severely low prealbumin.  TPN ordered per oncology recommendations. DVT Prophylaxis   SCD's due to mild thrombocytopenia    Code Status: Full.  Family Communication: plan of care discussed with the patient  Disposition Plan: SNF on discharge   IV Access:   Peripheral IV PICC Procedures and diagnostic studies:   Dg Chest 2 View 04/25/2014 IMPRESSION: No acute cardiopulmonary process. Cardiomegaly with mild central vascular congestion reidentified.  Dg Lumbar Spine Complete 04/25/2014 IMPRESSION: No acute osseous abnormality of the lumbar spine  Dg Pelvis 1-2 Views8/11/2013 IMPRESSION: Negative.  Ct Head Wo Contrast 04/25/2014 IMPRESSION: CT head: No acute intracranial process. Normal noncontrast CT of the head for age. CT cervical spine: Straightened cervical lordosis without acute fracture. Grade 1 C5-6 anterolisthesis on degenerative basis.  Ct Cervical Spine Wo Contrast 04/25/2014 IMPRESSION: CT head: No acute intracranial process. Normal noncontrast CT of the head for age. CT cervical spine: Straightened cervical lordosis without acute fracture. Grade 1 C5-6 anterolisthesis on degenerative basis.  Dg Chest Port 1 View 04/25/2014 IMPRESSION: Retrocardiac opacity which could represent atelectasis given the degree of hypoaeration  although pneumonia could appear similar. If symptoms persist, consider PA and lateral chest  radiographs obtained at full inspiration when the patient is clinically able.  Dg Chest 1 View 04/29/2014 IMPRESSION: Marked improvement RIGHT pleural effusion. No pneumothorax.  Dg Chest Port 1 View 04/29/2014 Significant increase in a right-sided pleural effusion when compared with the prior exam. Hiatal hernia.  US Thoracentesis Asp Pleural Space W/img Guide 04/29/2014 Successful ultrasound guided diagnostic and therapeutic right thoracentesis yielding 1.2 liters of pleural fluid. Read by: Rowe Robert, PA-C TPN 04/30/2014 Medical Consultants:   IR  Oncology (Dr. Burney Gauze) Other Consultants:   Nutrition  Anti-Infectives:   Flagyl 04/26/2014 -->   Leisa Lenz, MD  [facility] Pager [phone]  If 7PM-7AM, please contact night-coverage [email or web address] Password [email or web address] 05/01/2014, 11:28 AM   LOS: 6 days    HPI/Subjective: No acute overnight events.  Objective: Filed Vitals:   04/29/14 2054 04/30/14 0552 04/30/14 2110 05/01/14 0549  BP:  121/54 121/65 116/63  Pulse: 91 79 88 79  Temp: 98.1 F (36.7 C) 98.4 F (36.9 C) 98 F (36.7 C) 97.7 F (36.5 C)  TempSrc: Oral Oral Oral Oral  Resp: 18 16 16 16   Height:      Weight:      SpO2: 93% 96% 94% 91%    Intake/Output Summary (Last 24 hours) at 05/01/14 1128 Last data filed at 05/01/14 0549  Gross per 24 hour  Intake    360 ml  Output    400 ml  Net    -40 ml    Exam:   General:  Pt is alert, follows commands appropriately, not in acute distress  Cardiovascular: Regular rate and rhythm, S1/S2, no murmurs  Respiratory: wheezing in upper lung lobes, no rhonchi   Abdomen: Softly distended, bowel sounds present  Extremities: No edema, pulses DP and PT palpable bilaterally  Neuro: Grossly nonfocal  Data Reviewed: Basic Metabolic Panel:  Recent Labs Lab 04/25/14 1706 04/26/14 0404 04/29/14 0445 04/30/14 0543 05/01/14 0539  NA 136* 138 135* 138 138  K 3.9 3.6* 3.7 3.5* 3.4*  CL 100 103 105 107 107  CO2  20 24 19 19 22   GLUCOSE 171* 108* 103* 93 124*  BUN 28* 29* 23 25* 25*  CREATININE 1.45* 1.54* 1.29* 1.32* 1.18*  CALCIUM 10.6* 9.8 8.3* 8.6 8.6  MG  --   --   --   --  2.0  PHOS  --   --   --   --  2.5   Liver Function Tests:  Recent Labs Lab 04/25/14 1706 05/01/14 0539  AST 61* 36  ALT 35 26  ALKPHOS 145* 111  BILITOT 0.7 0.6  PROT 7.2 5.7*  ALBUMIN 3.1* 2.3*    Recent Labs Lab 04/25/14 1706  LIPASE 53   No results found for this basename: AMMONIA,  in the last 168 hours CBC:  Recent Labs Lab 04/25/14 1706 04/30/14 0956 05/01/14 0539  WBC 10.5 7.3 7.6  NEUTROABS 8.9*  --  4.2  HGB 13.9 12.7 11.9*  HCT 41.0 37.0 34.8*  MCV 94.5 91.8 92.8  PLT 138* 133* 134*   Cardiac Enzymes:  Recent Labs Lab 04/25/14 1706  TROPONINI <0.30   BNP: No components found with this basename: POCBNP,  CBG: No results found for this basename: GLUCAP,  in the last 168 hours  Recent Results (from the past 240 hour(s))  URINE CULTURE     Status: None   Collection Time    04/25/14  6:40 PM      Result Value Ref Range Status   Specimen Description URINE, RANDOM   Final   Special Requests NONE   Final   Culture  Setup Time     Final   Value: 04/26/2014 05:55     Performed at [place]     Final   Value: NO GROWTH     Performed at [facility]   Culture     Final   Value: NO GROWTH     Performed at [facility]   Report Status 04/27/2014 FINAL   Final  CULTURE, BLOOD (ROUTINE X 2)     Status: None   Collection Time    04/25/14  6:46 PM      Result Value Ref Range Status   Specimen Description BLOOD BLOOD RIGHT FOREARM   Final   Special Requests BOTTLES DRAWN AEROBIC AND ANAEROBIC 3ML   Final   Culture  Setup Time     Final   Value: 04/25/2014 22:52     Performed at [facility]   Culture     Final   Value:        BLOOD CULTURE RECEIVED NO GROWTH TO DATE CULTURE WILL BE HELD FOR 5 DAYS BEFORE ISSUING A FINAL NEGATIVE  REPORT     Performed at [facility]   Report Status PENDING   Incomplete  CULTURE, BLOOD (ROUTINE X 2)     Status: None   Collection Time    04/25/14  6:48 PM      Result Value Ref Range Status   Specimen Description BLOOD RIGHT ANTECUBITAL   Final   Special Requests BOTTLES DRAWN AEROBIC AND ANAEROBIC 3ML   Final   Culture  Setup Time     Final   Value: 04/25/2014 22:51     Performed at [facility]   Culture     Final   Value:        BLOOD CULTURE RECEIVED NO GROWTH TO DATE CULTURE WILL BE HELD FOR 5 DAYS BEFORE ISSUING A FINAL NEGATIVE REPORT     Performed at [facility]   Report Status PENDING   Incomplete  STOOL CULTURE     Status: None   Collection Time    04/25/14 11:36 PM      Result Value Ref Range Status   Specimen Description STOMA   Final   Special Requests Normal   Final   Culture     Final   Value: NO SALMONELLA, SHIGELLA, CAMPYLOBACTER, YERSINIA, OR E.COLI 0157:H7 ISOLATED     Performed at [facility]   Report Status 04/29/2014 FINAL   Final  URINE CULTURE     Status: None   Collection Time    04/25/14 11:38 PM      Result Value Ref Range Status   Specimen Description URINE, CLEAN CATCH   Final   Special Requests Normal   Final   Culture  Setup Time     Final   Value: 04/26/2014 05:55     Performed at [place]     Final   Value: 50,000 COLONIES/ML     Performed at [facility]   Culture     Final   Value: KLEBSIELLA ORNITHINOLYTICA     Performed at [facility]   Report Status 04/28/2014 FINAL   Final   Organism ID, Bacteria KLEBSIELLA ORNITHINOLYTICA  Final  CLOSTRIDIUM DIFFICILE BY PCR     Status: Abnormal   Collection Time    04/26/14  5:19 PM      Result Value Ref Range Status   C difficile by pcr POSITIVE (*) NEGATIVE Final   Comment: CRITICAL RESULT CALLED TO, READ BACK BY AND VERIFIED WITH:     Gay Filler RN 04/27/14 0734 COSTELLO B     Performed at [facility]   BODY FLUID CULTURE     Status: None   Collection Time    04/29/14  2:33 PM      Result Value Ref Range Status   Specimen Description FLUID RIGHT PLEURAL EFFUSION   Final   Special Requests Normal   Final   Gram Stain     Final   Value: NO WBC SEEN     NO ORGANISMS SEEN     Performed at [facility]   Culture     Final   Value: NO GROWTH 1 DAY     Performed at [facility]   Report Status PENDING   Incomplete     Scheduled Meds: . benzonatate  100 mg Oral BID  . cholecalciferol  1,000 Units Oral Daily  . lipase/protease/amylase  24,000 Units Oral TID AC  . metroNIDAZOLE  500 mg Oral 3 times per day  . pantoprazole  80 mg Oral Daily  . potassium phosphate   20 mmol Intravenous Once  . spironolactone  50 mg Oral Daily   Continuous Infusions: . sodium chloride    . Marland KitchenTPN (CLINIMIX-E) Adult 40 mL/hr at 04/30/14 1826   And  . fat emulsion 250 mL (04/30/14 1826)  . Marland KitchenTPN (CLINIMIX-E) Adult     And  . fat emulsion

## 2014-05-01 NOTE — Progress Notes (Signed)
NUTRITION FOLLOW UP  Intervention:   1.  Parenteral nutrition; continue per MD discretion.  Continue to encourage PO nutrition to promote bowel integrity.  2.  General healthful diet; encourage intake of foods and beverages as able.  RD to follow and assess for nutritional adequacy.  3.  Supplements; continue Boost.  Increase to 4 times daily. 4.  Nutrition-related labs; Consider re-drawing Pre-albumin to verify and trend. Pre-albumin is affected by stress response and inflammatory process, therefore, do not expect to see an improvement in this lab value during acute hospitalization.  Nutrition Dx:   Inadequate oral intake related to weakness/loose stools as evidenced by PO intake < 75% for one month, 5% body weight loss in one month.   Goal:  Pt to meet >/= 90% of their estimated nutrition needs   Monitor:  Total protein/energy intake, GI profile, labs, weight, glucose profile  Assessment:   [name] is a 80 y.o. female with prior h/o hypertension, diet controlled DM, recently admitted for cellulitis of the left leg and discharged on the 7/29, brought in by her daughter after she found her on the bathroom floor. As per the patient, she felt Weak, slightly went to the bathroom, and the next thing she knew she was on the floor. She also reports loose bowel Movements since 2 days, associated with nausea, no vomiting  Pt assessed by RD (8/5) who determine pt meet criteria for severe malnutrition.    Pt has had ongoing bouts of diarrhea- rectal tube remains in place.  She has been eating 25-65% of meals and supplementing with Boost twice daily.  She is keeping some foods as snacks, but her intake overall remains inadequate.   She has also started TPN.   Pt is ordered [place]. RD does not see associated diagnosis.   Height: Ht Readings from Last 1 Encounters:  04/25/14 5\' 6"  (1.676 m)    Weight Status:   Wt Readings from Last 1 Encounters:  04/25/14 171 lb 8 oz (77.792 kg)     Re-estimated needs:  Kcal: 1550-1750  Protein: 75-85 gram  Fluid: >/=1600 ml/daily   Skin: non-pitting LE edema, otherwise intake  Diet Order: Carb Control   Intake/Output Summary (Last 24 hours) at 05/01/14 1232 Last data filed at 05/01/14 0549  Gross per 24 hour  Intake    360 ml  Output    400 ml  Net    -40 ml    Last BM: 8/9   Labs:   Recent Labs Lab 04/29/14 0445 04/30/14 0543 05/01/14 0539  NA 135* 138 138  K 3.7 3.5* 3.4*  CL 105 107 107  CO2 19 19 22   BUN 23 25* 25*  CREATININE 1.29* 1.32* 1.18*  CALCIUM 8.3* 8.6 8.6  MG  --   --  2.0  PHOS  --   --  2.5  GLUCOSE 103* 93 124*    CBG (last 3)  No results found for this basename: GLUCAP,  in the last 72 hours  Scheduled Meds: . benzonatate  100 mg Oral BID  . cholecalciferol  1,000 Units Oral Daily  . insulin aspart  0-9 Units Subcutaneous TID WC  . lactose free nutrition  237 mL Oral TID WC  . lipase/protease/amylase  24,000 Units Oral TID AC  . metroNIDAZOLE  500 mg Oral 3 times per day  . pantoprazole  80 mg Oral Daily  . potassium phosphate IVPB (mmol)  20 mmol Intravenous Once  . sodium chloride  10-40 mL  Intracatheter Q12H  . spironolactone  50 mg Oral Daily    Continuous Infusions: . sodium chloride    . Marland KitchenTPN (CLINIMIX-E) Adult 40 mL/hr at 04/30/14 1826   And  . fat emulsion 250 mL (04/30/14 1826)  . Marland KitchenTPN (CLINIMIX-E) Adult     And  . fat emulsion      Brynda Greathouse, MS RD LDN Clinical Inpatient Dietitian Weekend/After hours pager: [phone]

## 2014-05-01 NOTE — Progress Notes (Signed)
PARENTERAL NUTRITION CONSULT NOTE - INITIAL  Pharmacy Consult for TNA Indication: poor po intake PTA/CDiff  Allergies  Allergen Reactions  . Avelox [Moxifloxacin Hcl In Nacl] Anaphylaxis  . Statins Other (See Comments)    Liver function elevations  . Sulfa Antibiotics Hives and Itching  . Ace Inhibitors     Unknown  . Doxycycline     Unknown  . Infed [Iron Dextran] Other (See Comments)    IV IRON, Unknown  . Infergen [Interferon Alfacon-1]   . Vancomycin Other (See Comments)    Flushing, erythema to neck and face, tolerated vanc at lower infusion rate  . Ciprofloxacin Other (See Comments)    Complains of sore mouth  . Fenofibrate Other (See Comments)    constipation   Patient Measurements: Height: 5\' 6"  (167.6 cm) Weight: 171 lb 8 oz (77.792 kg) IBW/kg (Calculated) : 59.3  Vital Signs: Temp: 97.7 F (36.5 C) (08/09 0549) Temp src: Oral (08/09 0549) BP: 116/63 mmHg (08/09 0549) Pulse Rate: 79 (08/09 0549) Intake/Output from previous day: 08/08 0701 - 08/09 0700 In: 360 [P.O.:360] Out: 400 [Urine:400] Intake/Output from this shift:    Labs:  Recent Labs  04/30/14 0956 05/01/14 0539  WBC 7.3 7.6  HGB 12.7 11.9*  HCT 37.0 34.8*  PLT 133* 134*  INR 1.35  --     Recent Labs  04/29/14 0445 04/30/14 0543 04/30/14 0956 05/01/14 0539  NA 135* 138  --  138  K 3.7 3.5*  --  3.4*  CL 105 107  --  107  CO2 19 19  --  22  GLUCOSE 103* 93  --  124*  BUN 23 25*  --  25*  CREATININE 1.29* 1.32*  --  1.18*  CALCIUM 8.3* 8.6  --  8.6  MG  --   --   --  2.0  PHOS  --   --   --  2.5  PROT  --   --   --  5.7*  ALBUMIN  --   --   --  2.3*  AST  --   --   --  36  ALT  --   --   --  26  ALKPHOS  --   --   --  111  BILITOT  --   --   --  0.6  PREALBUMIN  --   --  5.2*  --   TRIG  --   --   --  55   Estimated Creatinine Clearance: 38 ml/min (by C-G formula based on Cr of 1.18).   No results found for this basename: GLUCAP,  in the last 72 hours  Medications:   Scheduled:  . benzonatate  100 mg Oral BID  . cholecalciferol  1,000 Units Oral Daily  . insulin aspart  0-9 Units Subcutaneous 3 times per day  . lactose free nutrition  237 mL Oral TID WC  . lipase/protease/amylase  24,000 Units Oral TID AC  . metroNIDAZOLE  500 mg Oral 3 times per day  . pantoprazole  80 mg Oral Daily  . potassium phosphate IVPB (mmol)  20 mmol Intravenous Once  . sodium chloride  10-40 mL Intracatheter Q12H  . spironolactone  50 mg Oral Daily   Infusions:  . sodium chloride    . Marland KitchenTPN (CLINIMIX-E) Adult 40 mL/hr at 04/30/14 1826   And  . fat emulsion 250 mL (04/30/14 1826)  . Marland KitchenTPN (CLINIMIX-E) Adult     And  . fat emulsion  Insulin Requirements in the past 24 hours:  hx of DM-diet controlled Start Novolog sensitive scale tidac today with serum glucose 124 this am  Current Nutrition:  Carb-modified diet from 8/4: intake ranges 65-100% of meals Boost Plus tid  Assessment: 69 yoF admitted 8/3 after LOC at home with hx of nausea/loose stools at home. Previous admit 7/26 for cellulitis; received Rocephin x 1 dose, Vancomycin x 3 doses, discharged on po Keflex. CDiff culture ordered 7/26, never obtained. Poor po intake PTA, 10 lb weight loss noted.   Clinimix E 5/15 + Lipids 20% at 70 ml/hr would deliver 84 gm protein, 1670 kCal/24 hr  PICC line placed 8/8  NS at 10 ml/hr  TNA will not increase Albumin or Pre-Albumin levels quickly  Labs:  Day 2 TNA  Electrolytes: K 3.4, Mag 2.0, Phosphorus 2.5. SCr reduced 1.32-> 1.18  LFT's: wnl  Pre-Albumin: 5.2  Triglycerides: 55  Nutritional Goals:  1550-1750 kCal, 75-85 grams of protein per day/ Dietitian consult 8/5  Plan:   Continue Clinimix E 5/15 at 40 ml/hr  MVI and Trace elements daily  Advance to goal rate as tolerated  Potassium Phosphorus 20 mmol bolus this am  TNA labs Mon, Manual Meier PharmD Pager [phone] 05/01/2014, 11:42 AM

## 2014-05-02 ENCOUNTER — Inpatient Hospital Stay (HOSPITAL_COMMUNITY): Payer: Medicare Other

## 2014-05-02 ENCOUNTER — Ambulatory Visit: Payer: Medicare Other | Admitting: Family

## 2014-05-02 ENCOUNTER — Other Ambulatory Visit: Payer: Medicare Other | Admitting: Lab

## 2014-05-02 LAB — DIFFERENTIAL
BASOS ABS: 0.1 10*3/uL (ref 0.0–0.1)
Basophils Relative: 1 % (ref 0–1)
EOS ABS: 0.6 10*3/uL (ref 0.0–0.7)
Eosinophils Relative: 6 % — ABNORMAL HIGH (ref 0–5)
LYMPHS PCT: 25 % (ref 12–46)
Lymphs Abs: 2.3 10*3/uL (ref 0.7–4.0)
MONO ABS: 1.1 10*3/uL — AB (ref 0.1–1.0)
Monocytes Relative: 12 % (ref 3–12)
NEUTROS ABS: 5.1 10*3/uL (ref 1.7–7.7)
Neutrophils Relative %: 56 % (ref 43–77)

## 2014-05-02 LAB — COMPREHENSIVE METABOLIC PANEL
ALBUMIN: 2.4 g/dL — AB (ref 3.5–5.2)
ALK PHOS: 119 U/L — AB (ref 39–117)
ALT: 24 U/L (ref 0–35)
AST: 36 U/L (ref 0–37)
Anion gap: 11 (ref 5–15)
BILIRUBIN TOTAL: 0.5 mg/dL (ref 0.3–1.2)
BUN: 24 mg/dL — AB (ref 6–23)
CHLORIDE: 104 meq/L (ref 96–112)
CO2: 23 mEq/L (ref 19–32)
Calcium: 9 mg/dL (ref 8.4–10.5)
Creatinine, Ser: 1.17 mg/dL — ABNORMAL HIGH (ref 0.50–1.10)
GFR calc Af Amer: 49 mL/min — ABNORMAL LOW (ref >90)
GFR calc non Af Amer: 42 mL/min — ABNORMAL LOW (ref >90)
Glucose, Bld: 122 mg/dL — ABNORMAL HIGH (ref 70–99)
Potassium: 3.9 mEq/L (ref 3.7–5.3)
Sodium: 138 mEq/L (ref 137–147)
Total Protein: 6.2 g/dL (ref 6.0–8.3)

## 2014-05-02 LAB — MAGNESIUM: Magnesium: 2 mg/dL (ref 1.5–2.5)

## 2014-05-02 LAB — CBC
HEMATOCRIT: 36.9 % (ref 36.0–46.0)
Hemoglobin: 12.6 g/dL (ref 12.0–15.0)
MCH: 31.8 pg (ref 26.0–34.0)
MCHC: 34.1 g/dL (ref 30.0–36.0)
MCV: 93.2 fL (ref 78.0–100.0)
Platelets: 134 10*3/uL — ABNORMAL LOW (ref 150–400)
RBC: 3.96 MIL/uL (ref 3.87–5.11)
RDW: 13.8 % (ref 11.5–15.5)
WBC: 9.2 10*3/uL (ref 4.0–10.5)

## 2014-05-02 LAB — BODY FLUID CULTURE
CULTURE: NO GROWTH
GRAM STAIN: NONE SEEN
Special Requests: NORMAL

## 2014-05-02 LAB — TRIGLYCERIDES: Triglycerides: 46 mg/dL (ref <150)

## 2014-05-02 LAB — GLUCOSE, CAPILLARY
Glucose-Capillary: 113 mg/dL — ABNORMAL HIGH (ref 70–99)
Glucose-Capillary: 118 mg/dL — ABNORMAL HIGH (ref 70–99)
Glucose-Capillary: 203 mg/dL — ABNORMAL HIGH (ref 70–99)

## 2014-05-02 LAB — PREALBUMIN: PREALBUMIN: 6.1 mg/dL — AB (ref 17.0–34.0)

## 2014-05-02 LAB — PHOSPHORUS: Phosphorus: 3.5 mg/dL (ref 2.3–4.6)

## 2014-05-02 MED ORDER — FAT EMULSION 20 % IV EMUL
250.0000 mL | INTRAVENOUS | Status: AC
  Administered 2014-05-02: 250 mL via INTRAVENOUS
  Filled 2014-05-02: qty 250

## 2014-05-02 MED ORDER — TRACE MINERALS CR-CU-F-FE-I-MN-MO-SE-ZN IV SOLN
INTRAVENOUS | Status: AC
  Administered 2014-05-02: 18:00:00 via INTRAVENOUS
  Filled 2014-05-02: qty 2000

## 2014-05-02 NOTE — Progress Notes (Addendum)
Patient ID: [name], female   DOB: Oct 15, 1927, 80 y.o.   MRN: [id number] [facility] PROGRESS NOTE  JASKIRAN PATA ZSW:[id number] DOB: [birth year] DOA: 04/25/2014 PCP: Alesia Richards, MD  Brief narrative: 80 y.o. female with past medical history of hypertension, prior history of MI, TIA, hepatoma who presented to [facility] ED 04/25/2014 status post fall at home. Pt also reported nausea and vomiting and having loose bowel movements but no diarrhea. On admission, x-ray of the lumbar spine and pelvis did not reveal acute fractures. CT head and CT cervical spine did not show acute intracranial findings. Chest x-ray showed no acute cardiopulmonary process. Blood work revealed mild hyponatremia, sodium 136. Creatinine was 1.45 and calcium 10.6. Urinalysis showed only trace leukocytes. Patient was admitted for further evaluation. She was subsequently found to have C.diff enteritis and started on flagyl. Her hospital course is complicated due to worsening abdominal distention which based on CT abdomen is due to ascites however minimal ascites was seen on US abdomen. CT abdomen also showed large right sided pleural effusion. Pt underwent thoracentesis 04/29/2014 with 1.2 L fluid drained and then 8/10 with another 1 L fluid removed.  Assessment/Plan:   Principal Problem:  Acute respiratory failure with hypoxia / Right pleural effusion  Large right pleural effusion seen on CT chest. CXR confirmed large right pleural effusion. Pt underwent right US thoracentesis with 1.2 L fluid drained. Cytology pending. Fluid culture so far no growth. Based on lab analysis, likely transudative effusion (protein fluid / protein serum - 0.19 (<0.5). Likely from hypoalbuminemia. Pt short of breath 8/9 so CXR repeat and showed re-accumulation of pleural fluid. Another thoracentesis done 8/10 with 1 L fluid removed.  Given 1 dose of lasix 20 mg IV 04/29/2014.   Continue tessalon capsules for cough BID. Bronchodilator treatment  ordered, xopenex and atrovent every 6 hours PRN shortness of breath or wheezing.  Active Problems:  Abdominal ascites  Seen on CT abdomen / pelvis but not on abdominal US. Per IR, not enough to drain. Abd softly distended this am. Fall, weakness  Likely due to dehydration. Per PT evaluation, pt requires SNFon discharge and she and her family are agreeable to SNF placement.  Hypertension  Blood pressure soft on the admission. All antihypertensives on hold except for spironolactone. C.diff enteritis /Abdominal distention  Started flagyl 04/26/2014; due to ongoing diarrhea flexiseal inserted  Acute renal failure  Likely secondary to dehydration  Improved with IV fluids from 1.45 to 1.17 Hypokalemia  Likely due to GI losses Being repleted and now WNL. Hypercalcemia  Likely due to dehydration. Resolved with IV fluids. Moderate protein calorie malnutrition  Nutrition consulted  Severely low prealbumin.  TPN ordered per oncology recommendations. DVT Prophylaxis  SCD's due to mild thrombocytopenia    Code Status: Full.  Family Communication: plan of care discussed with the patient  Disposition Plan: SNF on discharge    IV Access:   Peripheral IV  PICC Procedures and diagnostic studies:   Dg Chest 2 View 04/25/2014 IMPRESSION: No acute cardiopulmonary process. Cardiomegaly with mild central vascular congestion reidentified.  Dg Lumbar Spine Complete 04/25/2014 IMPRESSION: No acute osseous abnormality of the lumbar spine  Dg Pelvis 1-2 Views8/11/2013 IMPRESSION: Negative.  Ct Head Wo Contrast 04/25/2014 IMPRESSION: CT head: No acute intracranial process. Normal noncontrast CT of the head for age. CT cervical spine: Straightened cervical lordosis without acute fracture. Grade 1 C5-6 anterolisthesis on degenerative basis.  Ct Cervical Spine Wo Contrast 04/25/2014 IMPRESSION: CT head: No acute intracranial process.  Normal noncontrast CT of the head for age. CT cervical spine: Straightened cervical  lordosis without acute fracture. Grade 1 C5-6 anterolisthesis on degenerative basis.  Dg Chest Port 1 View 04/25/2014 IMPRESSION: Retrocardiac opacity which could represent atelectasis given the degree of hypoaeration although pneumonia could appear similar. If symptoms persist, consider PA and lateral chest radiographs obtained at full inspiration when the patient is clinically able.  Dg Chest 1 View 04/29/2014 IMPRESSION: Marked improvement RIGHT pleural effusion. No pneumothorax.  Dg Chest Port 1 View 04/29/2014 Significant increase in a right-sided pleural effusion when compared with the prior exam. Hiatal hernia.  US Thoracentesis Asp Pleural Space W/img Guide 04/29/2014 Successful ultrasound guided diagnostic and therapeutic right thoracentesis yielding 1.2 liters of pleural fluid. Read by: Rowe Robert, PA-C  TPN 04/30/2014 Thoracentesis 05/02/2014 - 1.1 L fluid removed  Medical Consultants:   IR for thoracentesi Oncology (Dr. Burney Gauze) Other Consultants:   Nutrition  Physical therapy  Anti-Infectives:   Flagyl 04/26/2014 -->   Leisa Lenz, MD  [facility] Pager [phone]  If 7PM-7AM, please contact night-coverage [email or web address] Password [email or web address] 05/02/2014, 12:09 PM   LOS: 7 days    HPI/Subjective: No acute overnight events.  Objective: Filed Vitals:   05/01/14 2144 05/02/14 0555 05/02/14 1025 05/02/14 1055  BP: 126/56 125/64 114/60 123/50  Pulse: 94 89    Temp: 98.1 F (36.7 C) 98 F (36.7 C)    TempSrc: Oral Oral    Resp: 15 15    Height:      Weight:      SpO2: 91% 90% 91% 90%    Intake/Output Summary (Last 24 hours) at 05/02/14 1209 Last data filed at 05/02/14 1100  Gross per 24 hour  Intake 1356.67 ml  Output   1300 ml  Net  56.67 ml    Exam:   General:  Pt is alert, follows commands appropriately, not in acute distress  Cardiovascular: Regular rate and rhythm, S1/S2, no murmurs  Respiratory: wheezing in upper and mid lung lobes. Diminished breath  sounds on right.  Abdomen: Softly distended, bowel sounds present  Extremities: No edema, pulses DP and PT palpable bilaterally  Neuro: Grossly nonfocal  Data Reviewed: Basic Metabolic Panel:  Recent Labs Lab 04/26/14 0404 04/29/14 0445 04/30/14 0543 05/01/14 0539 05/02/14 0600  NA 138 135* 138 138 138  K 3.6* 3.7 3.5* 3.4* 3.9  CL 103 105 107 107 104  CO2 24 19 19 22 23   GLUCOSE 108* 103* 93 124* 122*  BUN 29* 23 25* 25* 24*  CREATININE 1.54* 1.29* 1.32* 1.18* 1.17*  CALCIUM 9.8 8.3* 8.6 8.6 9.0  MG  --   --   --  2.0 2.0  PHOS  --   --   --  2.5 3.5   Liver Function Tests:  Recent Labs Lab 04/25/14 1706 05/01/14 0539 05/02/14 0600  AST 61* 36 36  ALT 35 26 24  ALKPHOS 145* 111 119*  BILITOT 0.7 0.6 0.5  PROT 7.2 5.7* 6.2  ALBUMIN 3.1* 2.3* 2.4*    Recent Labs Lab 04/25/14 1706  LIPASE 53   No results found for this basename: AMMONIA,  in the last 168 hours CBC:  Recent Labs Lab 04/25/14 1706 04/30/14 0956 05/01/14 0539 05/02/14 0600  WBC 10.5 7.3 7.6 9.2  NEUTROABS 8.9*  --  4.2 5.1  HGB 13.9 12.7 11.9* 12.6  HCT 41.0 37.0 34.8* 36.9  MCV 94.5 91.8 92.8 93.2  PLT 138* 133* 134* 134*   Cardiac Enzymes:  Recent Labs Lab 04/25/14 1706  TROPONINI <0.30   BNP: No components found with this basename: POCBNP,  CBG:  Recent Labs Lab 05/01/14 1658 05/02/14 0738 05/02/14 1146  GLUCAP 122* 113* 118*    URINE CULTURE     Status: None   Collection Time    04/25/14  6:40 PM      Result Value Ref Range Status   Specimen Description URINE, RANDOM   Final   Value: NO GROWTH     Performed at [facility]   Report Status 04/27/2014 FINAL   Final  CULTURE, BLOOD (ROUTINE X 2)     Status: None   Collection Time    04/25/14  6:46 PM      Result Value Ref Range Status   Specimen Description BLOOD BLOOD RIGHT FOREARM   Final   Value: NO GROWTH 5 DAYS     Performed at [facility]   Report Status 05/01/2014 FINAL   Final   CULTURE, BLOOD (ROUTINE X 2)     Status: None   Collection Time    04/25/14  6:48 PM      Result Value Ref Range Status   Specimen Description BLOOD RIGHT ANTECUBITAL   Final   Value: NO GROWTH 5 DAYS     Performed at [facility]   Report Status 05/01/2014 FINAL   Final  STOOL CULTURE     Status: None   Collection Time    04/25/14 11:36 PM      Result Value Ref Range Status   Specimen Description STOMA   Final   Special Requests Normal   Final   Culture     Final   Value: NO SALMONELLA, SHIGELLA, CAMPYLOBACTER, YERSINIA, OR E.COLI 0157:H7 ISOLATED     Performed at [facility]   Report Status 04/29/2014 FINAL   Final  URINE CULTURE     Status: None   Collection Time    04/25/14 11:38 PM      Result Value Ref Range Status   Specimen Description URINE, CLEAN CATCH   Final   Value: 50,000 COLONIES/ML   Value: KLEBSIELLA ORNITHINOLYTICA     Performed at [facility]   Report Status 04/28/2014 FINAL   Final   Organism ID, Bacteria KLEBSIELLA ORNITHINOLYTICA   Final  CLOSTRIDIUM DIFFICILE BY PCR     Status: Abnormal   Collection Time    04/26/14  5:19 PM      Result Value Ref Range Status   C difficile by pcr POSITIVE (*) NEGATIVE Final     Performed at [place]     Status: None   Collection Time    04/29/14  2:33 PM      Result Value Ref Range Status   Specimen Description FLUID RIGHT PLEURAL EFFUSION   Final   Value: NO GROWTH 3 DAYS     Performed at [facility]   Report Status 05/02/2014 FINAL   Final     Scheduled Meds: . benzonatate  100 mg Oral BID  . cholecalciferol  1,000 Units Oral Daily  . insulin aspart  0-9 Units Subcutaneous TID WC  . lactose free nutrition  237 mL Oral TID WC & HS  . lipase/protease/amylase  24,000 Units Oral TID AC  . metroNIDAZOLE  500 mg Oral 3 times per day  . pantoprazole  80 mg Oral Daily  . spironolactone  50 mg Oral Daily   Continuous Infusions: .  sodium chloride     . Marland KitchenTPN (CLINIMIX-E) Adult 40 mL/hr at 05/01/14 1718   And  . fat emulsion 250 mL (05/01/14 1736)  . Marland KitchenTPN (CLINIMIX-E) Adult     And  . fat emulsion

## 2014-05-02 NOTE — Procedures (Addendum)
US guided diagnostic/therapeutic right thoracentesis performed yielding 1.1 liters yellow fluid. The fluid was sent to the lab for cytology. F/u CXR pending. No immediate complications. Pt has only trace amount of ascites by today's limited US.

## 2014-05-02 NOTE — Progress Notes (Signed)
PT Cancellation Note  Patient Details Name: [name] MRN: [id number] DOB: [birth year]   Cancelled Treatment:    Reason Eval/Treat Not Completed: Patient at procedure or test/unavailable--will check back another time.    Weston Anna, MPT Pager: [phone]

## 2014-05-02 NOTE — Progress Notes (Signed)
CSW continuing to follow for disposition planning.  Plan is for [facility] upon discharge.  Pt currently on TNA, but not yet been clarified if pt will require TNA at discharge. [facility] would not be able to manage TNA if pt discharged on TNA, but other facilities would be an option.   CSW visited pt room and pt sleeping and no family at bedside.  CSW to continue to follow.  Alison Murray, MSW, [place] [facility] [phone]

## 2014-05-02 NOTE — Progress Notes (Signed)
PT Cancellation Note  Patient Details Name: [name] MRN: [id number] DOB: 1928-02-19   Cancelled Treatment:    Reason Eval/Treat Not Completed: Other--pt declined to participate at this time-just received lunch. Will attempt to check back later today. If not today, will follow up tomorrow.    Weston Anna, MPT Pager: [phone]

## 2014-05-02 NOTE — Progress Notes (Signed)
PT Cancellation Note  Patient Details Name: [name] MRN: [id number] DOB: [birth year]   Cancelled Treatment:    Reason Eval/Treat Not Completed: pt declined to participate. Requested PT check back on tomorrow.    Weston Anna, MPT Pager: [phone]

## 2014-05-02 NOTE — Progress Notes (Signed)
PARENTERAL NUTRITION CONSULT NOTE - Follow up  Pharmacy Consult for TNA Indication: poor po intake PTA/CDiff  Allergies  Allergen Reactions  . Avelox [Moxifloxacin Hcl In Nacl] Anaphylaxis  . Statins Other (See Comments)    Liver function elevations  . Sulfa Antibiotics Hives and Itching  . Ace Inhibitors     Unknown  . Doxycycline     Unknown  . Infed [Iron Dextran] Other (See Comments)    IV IRON, Unknown  . Infergen [Interferon Alfacon-1]   . Vancomycin Other (See Comments)    Flushing, erythema to neck and face, tolerated vanc at lower infusion rate  . Ciprofloxacin Other (See Comments)    Complains of sore mouth  . Fenofibrate Other (See Comments)    constipation   Patient Measurements: Height: 5\' 6"  (167.6 cm) Weight: 171 lb 8 oz (77.792 kg) IBW/kg (Calculated) : 59.3  Vital Signs: Temp: 98 F (36.7 C) (08/10 0555) Temp src: Oral (08/10 0555) BP: 123/50 mmHg (08/10 1055) Pulse Rate: 89 (08/10 0555) Intake/Output from previous day: 08/09 0701 - 08/10 0700 In: 1236.7 [P.O.:720; I.V.:10; IV Piggyback:506.7] Out: 1300 [Urine:1300] Intake/Output from this shift:    Labs:  Recent Labs  04/30/14 0956 05/01/14 0539 05/02/14 0600  WBC 7.3 7.6 9.2  HGB 12.7 11.9* 12.6  HCT 37.0 34.8* 36.9  PLT 133* 134* 134*  INR 1.35  --   --     Recent Labs  04/30/14 0543 04/30/14 0956 05/01/14 0539 05/02/14 0600  NA 138  --  138 138  K 3.5*  --  3.4* 3.9  CL 107  --  107 104  CO2 19  --  22 23  GLUCOSE 93  --  124* 122*  BUN 25*  --  25* 24*  CREATININE 1.32*  --  1.18* 1.17*  CALCIUM 8.6  --  8.6 9.0  MG  --   --  2.0 2.0  PHOS  --   --  2.5 3.5  PROT  --   --  5.7* 6.2  ALBUMIN  --   --  2.3* 2.4*  AST  --   --  36 36  ALT  --   --  26 24  ALKPHOS  --   --  111 119*  BILITOT  --   --  0.6 0.5  PREALBUMIN  --  5.2* 5.6*  --   TRIG  --   --  55 46   Estimated Creatinine Clearance: 38.4 ml/min (by C-G formula based on Cr of 1.17).    Recent Labs  05/01/14 1658 05/02/14 0738  GLUCAP 122* 113*    Medications:  Scheduled:  . benzonatate  100 mg Oral BID  . cholecalciferol  1,000 Units Oral Daily  . insulin aspart  0-9 Units Subcutaneous TID WC  . lactose free nutrition  237 mL Oral TID WC & HS  . lipase/protease/amylase  24,000 Units Oral TID AC  . metroNIDAZOLE  500 mg Oral 3 times per day  . pantoprazole  80 mg Oral Daily  . sodium chloride  10-40 mL Intracatheter Q12H  . spironolactone  50 mg Oral Daily   Infusions:  . sodium chloride    . Marland KitchenTPN (CLINIMIX-E) Adult 40 mL/hr at 05/01/14 1718   And  . fat emulsion 250 mL (05/01/14 1736)   Insulin Requirements in the past 24 hours:  hx of DM-diet controlled 1 unitsSSI tidac  Current Nutrition:  Carb-modified diet from 8/4: intake ranges 25-30% of meals Boost Plus tid-  refusing some  Assessment: 35 yoF admitted 8/3 after LOC at home with hx of nausea/loose stools at home. Previous admit 7/26 for cellulitis; received Rocephin x 1 dose, Vancomycin x 3 doses, discharged on po Keflex. CDiff culture ordered 7/26, never obtained. Poor po intake PTA, 10 lb weight loss noted. Pharmacy consulted to dose TNA per Oncology request due to low pre-albumin levels  Clinimix E 5/15 + Lipids 20% at 70 ml/hr would deliver 84 gm protein, 1670 kCal/24 hr  PICC line placed 8/8  NS at 10 ml/hr  TNA will not increase Albumin or Pre-Albumin levels quickly  Labs:  Day 3 TNA  Electrolytes: K 3.9, Mag 2.0, Phosphorus 3.5 (received 20 mmol KPhos IV on 8/9). SCr 1.17 (stable)  LFT's: wnl  Pre-Albumin: 5.6 (8/9), 5/2 (8/8)  Triglycerides: 46 (8/10), 55 (8/9)  Nutritional Goals:  1550-1750 kCal, 75-85 grams of protein per day/ Dietitian consult 8/5  Plan:   Increase Clinimix E 5/15 to 60 ml/hr  continue MIVF of NS at 10 ml/hr  MVI and Trace elements daily  Advance to goal rate as tolerated  Bmet, Mag, Phos in AM  TNA labs Mon, Halfway will follow up daily  Thank you  for the consult.  Currie Paris, PharmD, BCPS Pager: [phone] Pharmacy: [phone] 05/02/2014 12:00 PM

## 2014-05-03 ENCOUNTER — Inpatient Hospital Stay (HOSPITAL_COMMUNITY): Payer: Medicare Other

## 2014-05-03 DIAGNOSIS — R3919 Other difficulties with micturition: Secondary | ICD-10-CM

## 2014-05-03 DIAGNOSIS — R630 Anorexia: Secondary | ICD-10-CM

## 2014-05-03 DIAGNOSIS — E8809 Other disorders of plasma-protein metabolism, not elsewhere classified: Secondary | ICD-10-CM

## 2014-05-03 LAB — BASIC METABOLIC PANEL
Anion gap: 8 (ref 5–15)
BUN: 26 mg/dL — ABNORMAL HIGH (ref 6–23)
CHLORIDE: 104 meq/L (ref 96–112)
CO2: 24 meq/L (ref 19–32)
Calcium: 9.2 mg/dL (ref 8.4–10.5)
Creatinine, Ser: 1.18 mg/dL — ABNORMAL HIGH (ref 0.50–1.10)
GFR calc Af Amer: 48 mL/min — ABNORMAL LOW (ref >90)
GFR calc non Af Amer: 41 mL/min — ABNORMAL LOW (ref >90)
Glucose, Bld: 142 mg/dL — ABNORMAL HIGH (ref 70–99)
POTASSIUM: 4.3 meq/L (ref 3.7–5.3)
Sodium: 136 mEq/L — ABNORMAL LOW (ref 137–147)

## 2014-05-03 LAB — PHOSPHORUS: PHOSPHORUS: 2.9 mg/dL (ref 2.3–4.6)

## 2014-05-03 LAB — GLUCOSE, CAPILLARY
GLUCOSE-CAPILLARY: 133 mg/dL — AB (ref 70–99)
Glucose-Capillary: 141 mg/dL — ABNORMAL HIGH (ref 70–99)
Glucose-Capillary: 167 mg/dL — ABNORMAL HIGH (ref 70–99)

## 2014-05-03 LAB — MAGNESIUM: MAGNESIUM: 1.9 mg/dL (ref 1.5–2.5)

## 2014-05-03 MED ORDER — TRACE MINERALS CR-CU-F-FE-I-MN-MO-SE-ZN IV SOLN
INTRAVENOUS | Status: AC
  Administered 2014-05-03: 18:00:00 via INTRAVENOUS
  Filled 2014-05-03: qty 2000

## 2014-05-03 MED ORDER — KETOROLAC TROMETHAMINE 15 MG/ML IJ SOLN
INTRAMUSCULAR | Status: AC
Start: 2014-05-03 — End: 2014-05-04
  Filled 2014-05-03: qty 1

## 2014-05-03 MED ORDER — MEGESTROL ACETATE 40 MG/ML PO SUSP
800.0000 mg | Freq: Every day | ORAL | Status: DC
  Administered 2014-05-03: 800 mg via ORAL
  Filled 2014-05-03 (×2): qty 20

## 2014-05-03 MED ORDER — FAT EMULSION 20 % IV EMUL
250.0000 mL | INTRAVENOUS | Status: AC
  Administered 2014-05-03: 250 mL via INTRAVENOUS
  Filled 2014-05-03: qty 250

## 2014-05-03 MED ORDER — HYDROCOD POLST-CHLORPHEN POLST 10-8 MG/5ML PO LQCR
2.5000 mL | Freq: Once | ORAL | Status: AC
  Administered 2014-05-04: 2.5 mL via ORAL
  Filled 2014-05-03: qty 5

## 2014-05-03 NOTE — Progress Notes (Signed)
[name] still is not eating much. Is on TNA.  Getting PT.  Dietary is following.  The right pleural effusio is (-) for malignancy x 2.  The is really no ascites to drain.  I have to believe that the pleural effusion is due to her cirrhosis.  Has C. Diff colitis.  Has rectal tube in place. This may be taken out today.  On Flagyl.  Her urine looks cloudy.  i would check this for infection.  She wants the foley catheter out.  I will give her Megace to help with appetitie.  She says that she is going to [facility]. She needs a LOT of PT for her weakness.  Her prealbumin is very low - 6.1 - and oi think this is very significant as this is a good measure of her fraility.  I do not that the h/o of HCCa is a problem right now.  She had RFA of this lesion last year. Recent CT scan really did not show any progression.  The TNA really is a huge help for her.  i am not sure how else to help her.  I really do appreciate the great care that she is getting!!  Pete E.

## 2014-05-03 NOTE — Progress Notes (Signed)
Physical Therapy Treatment Patient Details Name: [name] MRN: [id number] DOB: 1927-10-16 Today's Date: 05/03/2014    History of Present Illness      PT Comments    Progressing slowly with mobility. Mobility limited by fatigue, SOB this session. Pt declined ambulation in hallway due to flexiseal. Tolerated one lap around bed in room. SNF  Follow Up Recommendations  SNF     Equipment Recommendations  None recommended by PT    Recommendations for Other Services OT consult     Precautions / Restrictions Precautions Precautions: Fall Precaution Comments: rectal tube, cdiff Restrictions Weight Bearing Restrictions: No    Mobility  Bed Mobility Overal bed mobility: Needs Assistance Bed Mobility: Supine to Sit     Supine to sit: Min assist Sit to supine: Min assist   General bed mobility comments: Assist for trunk and LEs, and to manage lines/tube. Increased time.  Transfers Overall transfer level: Needs assistance Equipment used: Rolling walker (2 wheeled) Transfers: Sit to/from Stand Sit to Stand: Min assist         General transfer comment: assist to power up and gain balance initially  Ambulation/Gait Ambulation/Gait assistance: Min assist;+2 physical assistance;+2 safety/equipment Ambulation Distance (Feet): 15 Feet Assistive device: Rolling walker (2 wheeled)       General Gait Details: assist to stabilize and manage lines/equipmenet. slow gait speed.    Stairs            Wheelchair Mobility    Modified Rankin (Stroke Patients Only)       Balance                                    Cognition Arousal/Alertness: Awake/alert Behavior During Therapy: WFL for tasks assessed/performed Overall Cognitive Status: Within Functional Limits for tasks assessed                      Exercises      General Comments        Pertinent Vitals/Pain Pain Assessment: No/denies pain    Home Living                      Prior Function            PT Goals (current goals can now be found in the care plan section) Progress towards PT goals: Progressing toward goals    Frequency  Min 3X/week    PT Plan Current plan remains appropriate    Co-evaluation             End of Session   Activity Tolerance: Patient limited by fatigue Patient left: in bed;with call bell/phone within reach     Time: 1059-1116 PT Time Calculation (min): 17 min  Charges:  $Gait Training: 8-22 mins                    G Codes:      Weston Anna, MPT Pager: [phone]

## 2014-05-03 NOTE — Progress Notes (Signed)
NUTRITION FOLLOW UP  Intervention:   - Consider increasing Megace to 400 mg BID for more therapeutic dose for appetite stimulation - Parenteral nutrition per pharmacy; continue per MD discretion.  Continue to encourage PO nutrition to promote bowel integrity.  - General healthful diet; encourage intake of foods and beverages as able.  - Supplements; continue Boost QID   Nutrition Dx:   Inadequate oral intake related to weakness/loose stools as evidenced by PO intake < 75% for one month, 5% body weight loss in one month.   Goal:  Pt to meet >/= 90% of their estimated nutrition needs   Monitor:  Total protein/energy intake, GI profile, labs, weight, glucose profile  Assessment:   [name] is a 80 y.o. female with prior h/o hypertension, diet controlled DM, recently admitted for cellulitis of the left leg and discharged on the 7/29, brought in by her daughter after she found her on the bathroom floor. As per the patient, she felt Weak, slightly went to the bathroom, and the next thing she knew she was on the floor. She also reports loose bowel Movements since 2 days, associated with nausea, no vomiting  Pt assessed by RD (8/5) who determine pt meet criteria for severe malnutrition.    Pt has had ongoing bouts of diarrhea- rectal tube remains in place.  She has been eating 25-65% of meals and supplementing with Boost twice daily.  She is keeping some foods as snacks, but her intake overall remains inadequate.   She has also started TPN.   Pt is ordered [place]. RD does not see associated diagnosis.   8/11: -Continues with decreased appetite. Pt consuming 25% of breakfast, and approximately 50% of dinner meals.  -Is receiving Boost QID. Pt reported consuming approximately 50% of each supplement -Pt started on Megace 40 mg on 8/11; consider increasing to 400 mg dosage for more therapeutic appetite stimulant effects -Encouraged PO intake, offered additional snacks or supplements, which pt  declined. Pt eager to begin moving and participating in PT/OT as she believes this will also help improve appetite -Receiving Clinimix E5/15 at 60 mlhr, with plan to advance to goal of 70 ml/hr. This would provide 1670 kcal (100% est kcal needs), 84 gram protein (100% est protein needs) -Flexiseal to be removed today 8/11 per MD notes -Phos/K/Mg WNL -CBGs < 150 mg/dl, one episode > 200 -Prealbumin low but trending up. Pre-Albumin is strongly affected by stress response and inflammatory process, therefore, do not expect to see an improvement in this lab value during acute hospitalization.   Height: Ht Readings from Last 1 Encounters:  04/25/14 5\' 6"  (1.676 m)    Weight Status:   Wt Readings from Last 1 Encounters:  04/25/14 171 lb 8 oz (77.792 kg)    Re-estimated needs:  Kcal: 1550-1750  Protein: 75-85 gram  Fluid: >/=1600 ml/daily   Skin: non-pitting LE edema, otherwise intake  Diet Order: Carb Control   Intake/Output Summary (Last 24 hours) at 05/03/14 1200 Last data filed at 05/03/14 1000  Gross per 24 hour  Intake    250 ml  Output   1000 ml  Net   -750 ml    Last BM: 8/10   Labs:   Recent Labs Lab 05/01/14 0539 05/02/14 0600 05/03/14 0500  NA 138 138 136*  K 3.4* 3.9 4.3  CL 107 104 104  CO2 22 23 24   BUN 25* 24* 26*  CREATININE 1.18* 1.17* 1.18*  CALCIUM 8.6 9.0 9.2  MG 2.0 2.0  1.9  PHOS 2.5 3.5 2.9  GLUCOSE 124* 122* 142*    CBG (last 3)   Recent Labs  05/02/14 1146 05/02/14 1621 05/03/14 0720  GLUCAP 118* 203* 141*    Scheduled Meds: . benzonatate  100 mg Oral BID  . cholecalciferol  1,000 Units Oral Daily  . insulin aspart  0-9 Units Subcutaneous TID WC  . lactose free nutrition  237 mL Oral TID WC & HS  . megestrol  800 mg Oral Daily  . metroNIDAZOLE  500 mg Oral 3 times per day  . pantoprazole  80 mg Oral Daily  . sodium chloride  10-40 mL Intracatheter Q12H  . spironolactone  50 mg Oral Daily    Continuous Infusions: .  sodium chloride    . Marland KitchenTPN (CLINIMIX-E) Adult 60 mL/hr at 05/02/14 1821   And  . fat emulsion 250 mL (05/02/14 1820)    Brynda Greathouse, MS RD LDN Clinical Inpatient Dietitian Weekend/After hours pager: [phone]

## 2014-05-03 NOTE — Progress Notes (Addendum)
Patient ID: [name], female   DOB: 1928-07-30, 80 y.o.   MRN: [id number] [facility] PROGRESS NOTE  [name] TIW:[id number] DOB: [birth year] DOA: 04/25/2014 PCP: Alesia Richards, MD  Brief narrative: 80 y.o. female with past medical history of hypertension, prior history of MI, TIA, hepatoma who presented to [facility] ED 04/25/2014 status post fall at home. Pt also reported nausea and vomiting and having loose bowel movements but no diarrhea, On admission, x-ray of the lumbar spine and pelvis did not reveal acute fractures. CT head and CT cervical spine did not show acute intracranial findings. Chest x-ray showed no acute cardiopulmonary process. Blood work revealed mild hyponatremia, sodium 136. Creatinine was 1.45 and calcium 10.6. Urinalysis showed only trace leukocytes. Patient was admitted for further evaluation. She was subsequently found to have C.diff enteritis and started on flagyl. Her hospital course is complicated due to worsening abdominal distention which based on CT abdomen is due to ascites however minimal ascites was seen on US abdomen. CT abdomen also showed large right sided pleural effusion. Patient is status post ultrasound-guided thoracentesis on 04/29/2014 and 05/02/2014. Fluid cytology results consistent with inflammatory changes.  Assessment/Plan:   Principal Problem:  Acute respiratory failure with hypoxia / Right pleural effusion  Large right pleural effusion seen on CT chest. CXR confirmed large right pleural effusion. Patient underwent US guided thoracentesis on 04/29/2014 with a 1.2 L fluid removed. A cytology consistent with inflammatory changes. Fluid culture so far shows no growth. Based on fluid lab analysis this is likely transudative effusion likely from hypoalbuminemia, malnutrition. (Protein fluid / protein serum - 0.19 (<0.5).  Patient underwent second ultrasound-guided thoracentesis 05/02/2014 with 1.1 L fluid drained. Respiratory status is stable this  morning. Of note, patient was given one dose of Lasix on 04/29/2014, 20 mg IV. We will continue Tessalon capsules for cough twice daily. Continue bronchodilator treatments, Xopenex and Atrovent every 6 hours as needed for shortness of breath or wheezing.  Active Problems:  Abdominal ascites  Seen on CT abdomen / pelvis but not on abdominal US. Abdominal ultrasound was repeated and there was not enough ascites to be drained.  Fall, weakness  Likely due to dehydration. Per PT evaluation, pt requires SNFon discharge and she and her family are agreeable to SNF placement.  Hypertension  Blood pressure soft on the admission. All antihypertensives on hold except for spironolactone. C.diff enteritis /Abdominal distention  Started flagyl 04/26/2014; due to ongoing diarrhea flexiseal inserted.  We will discontinue Flexiseal today 05/03/2014 Acute renal failure  Likely secondary to dehydration  Improved with IV fluids from 1.45 to 1.17 Hypokalemia  Likely due to GI losses  Repleted and now WNL. Hypercalcemia  Likely due to dehydration. Resolved with IV fluids. Moderate protein calorie malnutrition  Nutrition consulted  Severely low prealbumin.  TPN ordered per oncology recommendations. DVT Prophylaxis  SCD's due to mild thrombocytopenia    Code Status: Full.  Family Communication: plan of care discussed with the patient  Disposition Plan: SNF on discharge    IV Access:   Peripheral IV  PICC Procedures and diagnostic studies:   Dg Chest 2 View 04/25/2014 IMPRESSION: No acute cardiopulmonary process. Cardiomegaly with mild central vascular congestion reidentified.  Dg Lumbar Spine Complete 04/25/2014 IMPRESSION: No acute osseous abnormality of the lumbar spine  Dg Pelvis 1-2 Views8/11/2013 IMPRESSION: Negative.  Ct Head Wo Contrast 04/25/2014 IMPRESSION: CT head: No acute intracranial process. Normal noncontrast CT of the head for age. CT cervical spine: Straightened cervical lordosis without acute  fracture. Grade 1 C5-6 anterolisthesis on degenerative basis.  Ct Cervical Spine Wo Contrast 04/25/2014 IMPRESSION: CT head: No acute intracranial process. Normal noncontrast CT of the head for age. CT cervical spine: Straightened cervical lordosis without acute fracture. Grade 1 C5-6 anterolisthesis on degenerative basis.  Dg Chest [facility] 04/25/2014 IMPRESSION: Retrocardiac opacity which could represent atelectasis given the degree of hypoaeration although pneumonia could appear similar. If symptoms persist, consider PA and lateral chest radiographs obtained at full inspiration when the patient is clinically able.  Dg Chest  04/29/2014 IMPRESSION: Marked improvement RIGHT pleural effusion. No pneumothorax.  Dg Chest [facility]  04/29/2014 Significant increase in a right-sided pleural effusion when compared with the prior exam. Hiatal hernia.  US Thoracentesis Asp Pleural Space W/img Guide 04/29/2014 Successful ultrasound guided diagnostic and therapeutic right thoracentesis yielding 1.2 liters of pleural fluid.  TPN 04/30/2014  Dg Chest Port  05/01/2014   1.Interval placement of right upper extremity PICC with tip terminating in the proximal superior vena cava. 2. Interval accumulation of a moderate to large right-sided pleural effusion. There is also a small left pleural effusion. 3. Bibasilar atelectasis and/or consolidation.    US Thoracentesis Asp Pleural Space W/img Guide  05/02/2014  Successful ultrasound guided diagnostic and therapeutic right thoracentesis yielding 1.1 liters of pleural fluid.  Dg Chest 05/02/2014  Resolution of right pleural effusion, without pneumothorax.  Bibasilar airspace disease which could represent atelectasis or infection.    US Abdomen 05/02/2014    No significant amount of ascites to perform a therapeutic paracentesis.    Medical Consultants:   IR for thoracentesi  Oncology (Dr. Burney Gauze)  Other Consultants:   Nutrition  Physical therapy   Anti-Infectives:   Flagyl  04/26/2014  Leisa Lenz, MD  [facility] Pager [phone]  If 7PM-7AM, please contact night-coverage [email or web address] Password [email or web address] 05/03/2014, 7:30 AM   LOS: 8 days    HPI/Subjective: No acute overnight events.  Objective: Filed Vitals:   05/02/14 1055 05/02/14 1413 05/02/14 2219 05/03/14 0508  BP: 123/50 126/55 115/50 121/57  Pulse:  88 96 86  Temp:  97.8 F (36.6 C) 97.8 F (36.6 C) 98.5 F (36.9 C)  TempSrc:  Oral Oral Oral  Resp:  16 16 14   Height:      Weight:      SpO2: 90% 95% 93% 90%    Intake/Output Summary (Last 24 hours) at 05/03/14 0730 Last data filed at 05/03/14 0545  Gross per 24 hour  Intake    480 ml  Output   1000 ml  Net   -520 ml    Exam:   General:  Pt is alert, follows commands appropriately, not in acute distress  Cardiovascular: Regular rate and rhythm, S1/S2, no murmurs  Respiratory: diminished bilaterally , no wheezing  Abdomen: Softly distended, bowel sounds present  Extremities: No edema, pulses DP and PT palpable bilaterally  Neuro: Grossly nonfocal  Data Reviewed: Basic Metabolic Panel:  Recent Labs Lab 04/29/14 0445 04/30/14 0543 05/01/14 0539 05/02/14 0600 05/03/14 0500  NA 135* 138 138 138 136*  K 3.7 3.5* 3.4* 3.9 4.3  CL 105 107 107 104 104  CO2 19 19 22 23 24   GLUCOSE 103* 93 124* 122* 142*  BUN 23 25* 25* 24* 26*  CREATININE 1.29* 1.32* 1.18* 1.17* 1.18*  CALCIUM 8.3* 8.6 8.6 9.0 9.2  MG  --   --  2.0 2.0 1.9  PHOS  --   --  2.5 3.5 2.9   Liver Function Tests:  Recent Labs Lab 05/01/14 0539 05/02/14 0600  AST 36 36  ALT 26 24  ALKPHOS 111 119*  BILITOT 0.6 0.5  PROT 5.7* 6.2  ALBUMIN 2.3* 2.4*   No results found for this basename: LIPASE, AMYLASE,  in the last 168 hours No results found for this basename: AMMONIA,  in the last 168 hours CBC:  Recent Labs Lab 04/30/14 0956 05/01/14 0539 05/02/14 0600  WBC 7.3 7.6 9.2  NEUTROABS  --  4.2 5.1  HGB 12.7 11.9* 12.6  HCT 37.0 34.8*  36.9  MCV 91.8 92.8 93.2  PLT 133* 134* 134*   Cardiac Enzymes: No results found for this basename: CKTOTAL, CKMB, CKMBINDEX, TROPONINI,  in the last 168 hours BNP: No components found with this basename: POCBNP,  CBG:  Recent Labs Lab 05/01/14 1658 05/02/14 0738 05/02/14 1146 05/02/14 1621 05/03/14 0720  GLUCAP 122* 113* 118* 203* 141*    Recent Results (from the past 240 hour(s))  URINE CULTURE     Status: None   Collection Time    04/25/14  6:40 PM      Result Value Ref Range Status   Specimen Description URINE, RANDOM   Final   Special Requests NONE   Final   Culture  Setup Time     Final   Value: 04/26/2014 05:55     Performed at [place]     Final   Value: NO GROWTH     Performed at [facility]   Culture     Final   Value: NO GROWTH     Performed at [facility]   Report Status 04/27/2014 FINAL   Final  CULTURE, BLOOD (ROUTINE X 2)     Status: None   Collection Time    04/25/14  6:46 PM      Result Value Ref Range Status   Specimen Description BLOOD BLOOD RIGHT FOREARM   Final   Special Requests BOTTLES DRAWN AEROBIC AND ANAEROBIC 3ML   Final   Culture  Setup Time     Final   Value: 04/25/2014 22:52     Performed at [facility]   Culture     Final   Value: NO GROWTH 5 DAYS     Performed at [facility]   Report Status 05/01/2014 FINAL   Final  CULTURE, BLOOD (ROUTINE X 2)     Status: None   Collection Time    04/25/14  6:48 PM      Result Value Ref Range Status   Specimen Description BLOOD RIGHT ANTECUBITAL   Final   Special Requests BOTTLES DRAWN AEROBIC AND ANAEROBIC 3ML   Final   Culture  Setup Time     Final   Value: 04/25/2014 22:51     Performed at [facility]   Culture     Final   Value: NO GROWTH 5 DAYS     Performed at [facility]   Report Status 05/01/2014 FINAL   Final  STOOL CULTURE     Status: None   Collection Time    04/25/14 11:36 PM      Result Value  Ref Range Status   Specimen Description STOMA   Final   Special Requests Normal   Final   Culture     Final   Value: NO SALMONELLA, SHIGELLA, CAMPYLOBACTER, YERSINIA, OR E.COLI 0157:H7 ISOLATED     Performed at [facility]   Report Status 04/29/2014  FINAL   Final  URINE CULTURE     Status: None   Collection Time    04/25/14 11:38 PM      Result Value Ref Range Status   Specimen Description URINE, CLEAN CATCH   Final   Special Requests Normal   Final   Culture  Setup Time     Final   Value: 04/26/2014 05:55     Performed at [place]     Final   Value: 50,000 COLONIES/ML     Performed at [facility]   Culture     Final   Value: KLEBSIELLA ORNITHINOLYTICA     Performed at [facility]   Report Status 04/28/2014 FINAL   Final   Organism ID, Bacteria KLEBSIELLA ORNITHINOLYTICA   Final  CLOSTRIDIUM DIFFICILE BY PCR     Status: Abnormal   Collection Time    04/26/14  5:19 PM      Result Value Ref Range Status   C difficile by pcr POSITIVE (*) NEGATIVE Final   Comment: CRITICAL RESULT CALLED TO, READ BACK BY AND VERIFIED WITH:     Gay Filler RN 04/27/14 0734 COSTELLO B     Performed at [facility]  BODY FLUID CULTURE     Status: None   Collection Time    04/29/14  2:33 PM      Result Value Ref Range Status   Specimen Description FLUID RIGHT PLEURAL EFFUSION   Final   Special Requests Normal   Final   Gram Stain     Final   Value: NO WBC SEEN     NO ORGANISMS SEEN     Performed at [facility]   Culture     Final   Value: NO GROWTH 3 DAYS     Performed at [facility]   Report Status 05/02/2014 FINAL   Final     Scheduled Meds: . benzonatate  100 mg Oral BID  . cholecalciferol  1,000 Units Oral Daily  . insulin aspart  0-9 Units Subcutaneous TID WC  . lactose free nutrition  237 mL Oral TID WC & HS  . lipase/protease/amylase  24,000 Units Oral TID AC  . metroNIDAZOLE  500 mg Oral 3 times per day  .  pantoprazole  80 mg Oral Daily  . spironolactone  50 mg Oral Daily   Continuous Infusions: . sodium chloride    . Marland KitchenTPN (CLINIMIX-E) Adult 60 mL/hr at 05/02/14 1821   And  . fat emulsion 250 mL (05/02/14 1820)

## 2014-05-03 NOTE — Progress Notes (Signed)
PARENTERAL NUTRITION CONSULT NOTE - Follow up  Pharmacy Consult for TNA Indication: poor po intake PTA/CDiff  Allergies  Allergen Reactions  . Avelox [Moxifloxacin Hcl In Nacl] Anaphylaxis  . Statins Other (See Comments)    Liver function elevations  . Sulfa Antibiotics Hives and Itching  . Ace Inhibitors     Unknown  . Doxycycline     Unknown  . Infed [Iron Dextran] Other (See Comments)    IV IRON, Unknown  . Infergen [Interferon [place]-1]   . Vancomycin Other (See Comments)    Flushing, erythema to neck and face, tolerated vanc at lower infusion rate  . Ciprofloxacin Other (See Comments)    Complains of sore mouth  . Fenofibrate Other (See Comments)    constipation   Patient Measurements: Height: 5\' 6"  (167.6 cm) Weight: 171 lb 8 oz (77.792 kg) IBW/kg (Calculated) : 59.3  Vital Signs: Temp: 98.5 F (36.9 C) (08/11 0508) Temp src: Oral (08/11 0508) BP: 121/57 mmHg (08/11 0508) Pulse Rate: 86 (08/11 0508) Intake/Output from previous day: 08/10 0701 - 08/11 0700 In: 480 [P.O.:480] Out: 1000 [Urine:1000] Intake/Output from this shift:    Labs:  Recent Labs  05/01/14 0539 05/02/14 0600  WBC 7.6 9.2  HGB 11.9* 12.6  HCT 34.8* 36.9  PLT 134* 134*    Recent Labs  05/01/14 0539 05/02/14 0600 05/03/14 0500  NA 138 138 136*  K 3.4* 3.9 4.3  CL 107 104 104  CO2 22 23 24   GLUCOSE 124* 122* 142*  BUN 25* 24* 26*  CREATININE 1.18* 1.17* 1.18*  CALCIUM 8.6 9.0 9.2  MG 2.0 2.0 1.9  PHOS 2.5 3.5 2.9  PROT 5.7* 6.2  --   ALBUMIN 2.3* 2.4*  --   AST 36 36  --   ALT 26 24  --   ALKPHOS 111 119*  --   BILITOT 0.6 0.5  --   PREALBUMIN 5.6* 6.1*  --   TRIG 55 46  --    Estimated Creatinine Clearance: 38 ml/min (by C-G formula based on Cr of 1.18).    Recent Labs  05/02/14 1146 05/02/14 1621 05/03/14 0720  GLUCAP 118* 203* 141*    Medications:  Scheduled:  . benzonatate  100 mg Oral BID  . cholecalciferol  1,000 Units Oral Daily  . insulin  aspart  0-9 Units Subcutaneous TID WC  . lactose free nutrition  237 mL Oral TID WC & HS  . megestrol  800 mg Oral Daily  . metroNIDAZOLE  500 mg Oral 3 times per day  . pantoprazole  80 mg Oral Daily  . sodium chloride  10-40 mL Intracatheter Q12H  . spironolactone  50 mg Oral Daily   Infusions:  . sodium chloride    . Marland KitchenTPN (CLINIMIX-E) Adult 60 mL/hr at 05/02/14 1821   And  . fat emulsion 250 mL (05/02/14 1820)   Insulin Requirements in the past 24 hours:  hx of DM-diet controlled 1 unitsSSI tidac  Current Nutrition:  Carb-modified diet from 8/4: intake ranges 25-30% of meals Boost Plus tid- refusing some  Assessment: 32 yoF admitted 8/3 after LOC at home with hx of nausea/loose stools at home. Previous admit 7/26 for cellulitis; received Rocephin x 1 dose, Vancomycin x 3 doses, discharged on po Keflex. CDiff culture ordered 7/26, never obtained. Poor po intake PTA, 10 lb weight loss noted. Pharmacy consulted to dose TNA per Oncology request due to low pre-albumin levels  Clinimix E 5/15 + Lipids 20% at 70 ml/hr  would deliver 84 gm protein, 1670 kCal/24 hr  PICC line placed 8/8  NS at 10 ml/hr  TNA will not increase Albumin or Pre-Albumin levels quickly  Labs:  Day 4 TNA  Electrolytes: K 4.3, Mag 1.9, Phosphorus 2.9. SCr 1.18 (stable)  LFT's: wnl  Pre-Albumin: 6.1 (8/10), 5.6 (8/9), 5/2 (8/8)  Triglycerides: 46 (8/10), 55 (8/9)  Nutritional Goals:  1550-1750 kCal, 75-85 grams of protein per day/ Dietitian consult 8/5  Plan:   Increase Clinimix E 5/15 to goal rate of 70 ml/hr  continue MIVF of NS at 10 ml/hr  MVI and Trace elements daily  Advance to goal rate as tolerated  TNA labs Mon, [place] will follow up daily  Thank you for the consult.  Currie Paris, PharmD, BCPS Pager: [phone] Pharmacy: [phone] 05/03/2014 10:18 AM

## 2014-05-04 ENCOUNTER — Inpatient Hospital Stay (HOSPITAL_COMMUNITY): Payer: Medicare Other

## 2014-05-04 LAB — GLUCOSE, CAPILLARY
GLUCOSE-CAPILLARY: 152 mg/dL — AB (ref 70–99)
Glucose-Capillary: 128 mg/dL — ABNORMAL HIGH (ref 70–99)
Glucose-Capillary: 178 mg/dL — ABNORMAL HIGH (ref 70–99)

## 2014-05-04 LAB — BASIC METABOLIC PANEL
Anion gap: 8 (ref 5–15)
BUN: 31 mg/dL — ABNORMAL HIGH (ref 6–23)
CALCIUM: 9.4 mg/dL (ref 8.4–10.5)
CHLORIDE: 105 meq/L (ref 96–112)
CO2: 24 meq/L (ref 19–32)
Creatinine, Ser: 1.16 mg/dL — ABNORMAL HIGH (ref 0.50–1.10)
GFR calc Af Amer: 49 mL/min — ABNORMAL LOW (ref >90)
GFR calc non Af Amer: 42 mL/min — ABNORMAL LOW (ref >90)
Glucose, Bld: 123 mg/dL — ABNORMAL HIGH (ref 70–99)
Potassium: 4.4 mEq/L (ref 3.7–5.3)
SODIUM: 137 meq/L (ref 137–147)

## 2014-05-04 LAB — URINE CULTURE
Colony Count: NO GROWTH
Culture: NO GROWTH

## 2014-05-04 MED ORDER — FLUCONAZOLE 100MG IVPB
100.0000 mg | INTRAVENOUS | Status: DC
  Administered 2014-05-04 – 2014-05-09 (×6): 100 mg via INTRAVENOUS
  Filled 2014-05-04 (×9): qty 50

## 2014-05-04 MED ORDER — NYSTATIN 100000 UNIT/ML MT SUSP
5.0000 mL | Freq: Four times a day (QID) | OROMUCOSAL | Status: DC
  Administered 2014-05-04 – 2014-05-11 (×25): 500000 [IU] via ORAL
  Filled 2014-05-04 (×31): qty 5

## 2014-05-04 MED ORDER — TRACE MINERALS CR-CU-F-FE-I-MN-MO-SE-ZN IV SOLN
INTRAVENOUS | Status: AC
  Administered 2014-05-04: 17:00:00 via INTRAVENOUS
  Filled 2014-05-04: qty 2000

## 2014-05-04 MED ORDER — FUROSEMIDE 10 MG/ML IJ SOLN
40.0000 mg | Freq: Once | INTRAMUSCULAR | Status: AC
  Administered 2014-05-04: 40 mg via INTRAVENOUS
  Filled 2014-05-04: qty 4

## 2014-05-04 MED ORDER — FAT EMULSION 20 % IV EMUL
250.0000 mL | INTRAVENOUS | Status: AC
  Administered 2014-05-04: 250 mL via INTRAVENOUS
  Filled 2014-05-04: qty 250

## 2014-05-04 MED ORDER — MEGESTROL ACETATE 40 MG/ML PO SUSP
400.0000 mg | Freq: Two times a day (BID) | ORAL | Status: DC
  Administered 2014-05-04 – 2014-05-11 (×14): 400 mg via ORAL
  Filled 2014-05-04 (×17): qty 10

## 2014-05-04 NOTE — Progress Notes (Addendum)
Patient ID: [name], female   DOB: May 17, 1928, 80 y.o.   MRN: [id number] [facility] PROGRESS NOTE  BULAH LURIE [id number] DOB: 12-27-1927 DOA: 04/25/2014 PCP: Alesia Richards, MD  Brief narrative: 80 y.o. female with past medical history of hypertension, prior history of MI, TIA, hepatoma who presented to [facility] ED 04/25/2014 status post fall at home. Pt also reported nausea and vomiting and having loose bowel movements. On admission, x-ray studies were done of the lumbar spine and pelvis but did not reveal acute fractures. CT head and CT cervical spine did not show acute intracranial findings. Initial chest x-ray showed no acute cardiopulmonary process. Blood work revealed mild hyponatremia, sodium 136. Creatinine was 1.45 and calcium 10.6. Urinalysis showed only trace leukocytes. Hospital course has been complicated due to findings of C.diff on stool PCR for which reason she was started on flagyl. She required flexiseal due to ongoing diarrhea. Flexiseal was removed 05/03/2014. In addition, she continues to have pleural effusion accumulation on the right side which based on cytology results is negative for malignancy. She underwent 2 US guided thoracenteses so far with total of 2.3 L fluid drained. Her abdomen is softly distended and 2 abdominal US showed not significant amount of ascites to be drained. Pt requires TNA for nutritional support. Plan is to transitioned her off TNA and hopefully to [facility] SNF per family preference.   Assessment/Plan:   Principal Problem:  Acute respiratory failure with hypoxia / Right pleural effusion Please note that the initial CXR did not show acute cardiopulmonary findings. We obtained CT chest 04/28/2014 for evaluation of abdominal distention but her respiratory status was stable. On 04/29/2014, pt felt more short of breath at which time we obtained CXR which showed large right pleural effusion and then pt underwent US guided thoracentesis with 1.2 L fluid  removed. Cytology results were negative for malignancy. Fluid culture showed no growth. Considering her very severe malnutrition most likely re accumulation of right pleural effusion is from malnutrition. She again has US guided thoracentesis on 05/02/2014 with 1.1 L fluid removed. Repeat CXR 05/03/2014 showed improvement in pleural effusion. As a matter of fact it now seems there is bilateral pleural effusion and possibly some interstitial edema. I placed consult for US guided thoracentesis for today if additional amt can be drained since she is still some short of breath. Will give her 1 dose bolus of 40 mg IV lasix. Of note, patient was given one dose of Lasix on 04/29/2014, 20 mg IV We will continue Tessalon capsules for cough twice daily. Continue bronchodilator treatments, Xopenex and Atrovent every 6 hours as needed for shortness of breath or wheezing. Active Problems:  Abdominal ascites  Seen on CT abdomen / pelvis. Paracentesis attempted 2 times and per IR not significant amount of ascites see. Abd US done 05/02/2014 sis not show significant amount of ascites. Fall, weakness  Likely due to dehydration. Per PT evaluation, pt requires SNFon discharge and she and her family are agreeable to SNF placement.  Oral thrush  Start fluconazole and nystatin swish and swallow 05/04/2014. Hypertension  Blood pressure soft. All antihypertensives on hold except for spironolactone. C.diff enteritis /Abdominal distention  Started flagyl 04/26/2014; due to ongoing diarrhea flexiseal inserted. D/C Flexiseal 05/03/2014 Acute renal failure  Likely secondary to dehydration  Improved with IV fluids from 1.45 to 1.17 Hypokalemia  Likely due to GI losses  Repleted and WNL. Hypercalcemia  Likely due to dehydration. Resolved with IV fluids. Severe protein calorie malnutrition  Nutrition  consulted  Severely low prealbumin.  TPN ordered per oncology recommendations. DVT Prophylaxis  SCD's due to mild  thrombocytopenia    Code Status: Full.  Family Communication: plan of care discussed with the patient and her family at the bedside  Disposition Plan: SNF on discharge; hopefully we can wean off of TNA   IV Access:   Peripheral IV  PICC Procedures and diagnostic studies:   Dg Chest 2 View 04/25/2014 IMPRESSION: No acute cardiopulmonary process. Cardiomegaly with mild central vascular congestion reidentified.  Dg Lumbar Spine Complete 04/25/2014 IMPRESSION: No acute osseous abnormality of the lumbar spine  Dg Pelvis 1-2 Views8/11/2013 IMPRESSION: Negative.  Ct Head Wo Contrast 04/25/2014 IMPRESSION: CT head: No acute intracranial process. Normal noncontrast CT of the head for age. CT cervical spine: Straightened cervical lordosis without acute fracture. Grade 1 C5-6 anterolisthesis on degenerative basis.  Ct Cervical Spine Wo Contrast 04/25/2014 IMPRESSION: CT head: No acute intracranial process. Normal noncontrast CT of the head for age. CT cervical spine: Straightened cervical lordosis without acute fracture. Grade 1 C5-6 anterolisthesis on degenerative basis.  Dg Chest [facility] 04/25/2014 IMPRESSION: Retrocardiac opacity which could represent atelectasis given the degree of hypoaeration although pneumonia could appear similar. If symptoms persist, consider PA and lateral chest radiographs obtained at full inspiration when the patient is clinically able.  CT abdomen 04/28/2014 Morphologic features of the liver compatible with cirrhosis with stigmata of portal venous hypertension. 2. Increase in abdominal ascites. 3. Hepatoma site within the segment 6 of the liver is slightly increased in size from previous exam. 4. Large right pleural effusion. 5. Large hiatal hernia 6. Atherosclerotic disease 7. Prominent retroperitoneal adenopathy is nonspecific in the setting of cirrhosis and portal venous hypertension. This appears similar to previous exam. 8. Wall thickening of the colon is also nonspecific in the setting of  portal venous hypertension. This may be due to a hypoproteinemic state or colitis.  Dg Chest 04/29/2014 Marked improvement RIGHT pleural effusion. No pneumothorax.  Dg Chest [facility] 04/29/2014 Significant increase in a right-sided pleural effusion when compared with the prior exam. Hiatal hernia.  US Thoracentesis Asp Pleural Space W/img Guide 04/29/2014 Successful ultrasound guided diagnostic and therapeutic right thoracentesis yielding 1.2 liters of pleural fluid.  TPN 04/30/2014  Dg Chest Port 05/01/2014 1.Interval placement of right upper extremity PICC with tip terminating in the proximal superior vena cava. 2. Interval accumulation of a moderate to large right-sided pleural effusion. There is also a small left pleural effusion. 3. Bibasilar atelectasis and/or consolidation.  US Thoracentesis Asp Pleural Space W/img Guide 05/02/2014 Successful ultrasound guided diagnostic and therapeutic right thoracentesis yielding 1.1 liters of pleural fluid.  Dg Chest 05/02/2014 Resolution of right pleural effusion, without pneumothorax. Bibasilar airspace disease which could represent atelectasis or infection.  US Abdomen 05/02/2014 No significant amount of ascites to perform a therapeutic paracentesis.  DG abdomen 05/03/2014 - no bowel obstruction, small bilateral pleural effusion, on right seems to be resolving; possible interstitial edema Medical Consultants:   IR for thoracentesi  Oncology (Dr. Burney Gauze) Other Consultants:   Nutrition  Physical therapy  Anti-Infectives:   Flagyl 04/26/2014 --> Fluconazole 05/04/2014 -->   Leisa Lenz, MD  [facility] Pager [phone]  If 7PM-7AM, please contact night-coverage [email or web address] Password [email or web address] 05/04/2014, 10:22 AM   LOS: 9 days    HPI/Subjective: No acute overnight events.  Objective: Filed Vitals:   05/03/14 1400 05/03/14 2101 05/03/14 2120 05/04/14 0532  BP: 121/60 136/50  124/59  Pulse: 90 92 84 86  Temp:  98.6 F (37 C) 98.1 F (36.7 C)   98.2 F (36.8 C)  TempSrc: Oral Oral  Oral  Resp: 16 18  15   Height:      Weight:      SpO2: 93% 90%  92%    Intake/Output Summary (Last 24 hours) at 05/04/14 1022 Last data filed at 05/04/14 0630  Gross per 24 hour  Intake   1685 ml  Output   1300 ml  Net    385 ml    Exam:   General:  Pt is alert, follows commands appropriately, not in acute distress; oral thrush  Cardiovascular: Regular rate and rhythm, S1/S2, no murmurs  Respiratory: diminished on right side in mid and lower lung lobe, no significant wheezing appreciated  Abdomen: Softly distended, no tenderness, (+) BS  Extremities: No edema, pulses DP and PT palpable bilaterally  Neuro: Grossly nonfocal  Data Reviewed: Basic Metabolic Panel:  Recent Labs Lab 04/30/14 0543 05/01/14 0539 05/02/14 0600 05/03/14 0500 05/04/14 0615  NA 138 138 138 136* 137  K 3.5* 3.4* 3.9 4.3 4.4  CL 107 107 104 104 105  CO2 19 22 23 24 24   GLUCOSE 93 124* 122* 142* 123*  BUN 25* 25* 24* 26* 31*  CREATININE 1.32* 1.18* 1.17* 1.18* 1.16*  CALCIUM 8.6 8.6 9.0 9.2 9.4  MG  --  2.0 2.0 1.9  --   PHOS  --  2.5 3.5 2.9  --    Liver Function Tests:  Recent Labs Lab 05/01/14 0539 05/02/14 0600  AST 36 36  ALT 26 24  ALKPHOS 111 119*  BILITOT 0.6 0.5  PROT 5.7* 6.2  ALBUMIN 2.3* 2.4*   No results found for this basename: LIPASE, AMYLASE,  in the last 168 hours No results found for this basename: AMMONIA,  in the last 168 hours CBC:  Recent Labs Lab 04/30/14 0956 05/01/14 0539 05/02/14 0600  WBC 7.3 7.6 9.2  NEUTROABS  --  4.2 5.1  HGB 12.7 11.9* 12.6  HCT 37.0 34.8* 36.9  MCV 91.8 92.8 93.2  PLT 133* 134* 134*   Cardiac Enzymes: No results found for this basename: CKTOTAL, CKMB, CKMBINDEX, TROPONINI,  in the last 168 hours BNP: No components found with this basename: POCBNP,  CBG:  Recent Labs Lab 05/02/14 1621 05/03/14 0720 05/03/14 1144 05/03/14 1740 05/04/14 0823  GLUCAP 203* 141* 133* 167*  128*    URINE CULTURE     Status: None   Collection Time    04/25/14  6:40 PM      Result Value Ref Range Status   Specimen Description URINE, RANDOM   Final   Value: NO GROWTH     Performed at [facility]   Report Status 04/27/2014 FINAL   Final  CULTURE, BLOOD (ROUTINE X 2)     Status: None   Collection Time    04/25/14  6:46 PM      Result Value Ref Range Status   Specimen Description BLOOD BLOOD RIGHT FOREARM   Final   Value: NO GROWTH 5 DAYS     Performed at [facility]   Report Status 05/01/2014 FINAL   Final  CULTURE, BLOOD (ROUTINE X 2)     Status: None   Collection Time    04/25/14  6:48 PM      Result Value Ref Range Status   Specimen Description BLOOD RIGHT ANTECUBITAL   Final   Value: NO GROWTH 5 DAYS     Performed at  [facility]   Report Status 05/01/2014 FINAL   Final  STOOL CULTURE     Status: None   Collection Time    04/25/14 11:36 PM      Result Value Ref Range Status   Specimen Description STOMA   Final   Value: NO SALMONELLA, SHIGELLA, CAMPYLOBACTER, YERSINIA, OR E.COLI 0157:H7 ISOLATED     Performed at [facility]   Report Status 04/29/2014 FINAL   Final  URINE CULTURE     Status: None   Collection Time    04/25/14 11:38 PM      Result Value Ref Range Status   Specimen Description URINE, CLEAN CATCH   Final   Value: 50,000 COLONIES/ML     Performed at [facility]     Final   Value: KLEBSIELLA ORNITHINOLYTICA     Performed at [facility] DIFFICILE BY PCR     Status: Abnormal   Collection Time    04/26/14  5:19 PM      Result Value Ref Range Status   C difficile by pcr POSITIVE (*) NEGATIVE Final     Performed at [facility]     Status: None   Collection Time    04/29/14  2:33 PM      Result Value Ref Range Status   Specimen Description FLUID RIGHT PLEURAL EFFUSION   Final   Value: NO GROWTH 3 DAYS     Performed at [facility]    Report Status 05/02/2014 FINAL   Final     Scheduled Meds: . benzonatate  100 mg Oral BID  . cholecalciferol  1,000 Units Oral Daily  . fluconazole (DIFLUCAN)   100 mg Intravenous Q24H  . insulin aspart  0-9 Units Subcutaneous TID WC  . megestrol  400 mg Oral BID  . metroNIDAZOLE  500 mg Oral 3 times per day  . nystatin  5 mL Oral QID  . pantoprazole  80 mg Oral Daily  . spironolactone  50 mg Oral Daily   Continuous Infusions: . sodium chloride    . Marland KitchenTPN (CLINIMIX-E) Adult 70 mL/hr at 05/03/14 1739   And  . fat emulsion 250 mL (05/03/14 1739)

## 2014-05-04 NOTE — Progress Notes (Signed)
Patient c/o being unable to "go to sleep" even after xanax due to coughing. Paged NP on call and order for tussionex obtained x 1 dose.

## 2014-05-04 NOTE — Progress Notes (Signed)
Witnessed waste of 2.69mL

## 2014-05-04 NOTE — Progress Notes (Signed)
Patient came back from [facility], after the thoracentesis, bandaid to R upper back intact, no bleeding noted to site.- patient resting comfortably in bed, no c/o SOB.Sandie Ano RN

## 2014-05-04 NOTE — Progress Notes (Addendum)
PARENTERAL NUTRITION CONSULT NOTE - Follow up  Pharmacy Consult for TNA Indication: poor po intake PTA/CDiff  Allergies  Allergen Reactions  . Avelox [Moxifloxacin Hcl In Nacl] Anaphylaxis  . Statins Other (See Comments)    Liver function elevations  . Sulfa Antibiotics Hives and Itching  . Ace Inhibitors     Unknown  . Doxycycline     Unknown  . Infed [Iron Dextran] Other (See Comments)    IV IRON, Unknown  . Infergen [Interferon Alfacon-1]   . Vancomycin Other (See Comments)    Flushing, erythema to neck and face, tolerated vanc at lower infusion rate  . Ciprofloxacin Other (See Comments)    Complains of sore mouth  . Fenofibrate Other (See Comments)    constipation   Patient Measurements: Height: 5\' 6"  (167.6 cm) Weight: 171 lb 8 oz (77.792 kg) IBW/kg (Calculated) : 59.3  Vital Signs: Temp: 98.2 F (36.8 C) (08/12 0532) Temp src: Oral (08/12 0532) BP: 124/59 mmHg (08/12 0532) Pulse Rate: 86 (08/12 0532) Intake/Output from previous day: 08/11 0701 - 08/12 0700 In: 1815 [P.O.:440; I.V.:20; TPN:1355] Out: 1300 [Urine:1300] Intake/Output from this shift:    Labs:  Recent Labs  05/02/14 0600  WBC 9.2  HGB 12.6  HCT 36.9  PLT 134*    Recent Labs  05/02/14 0600 05/03/14 0500 05/04/14 0615  NA 138 136* 137  K 3.9 4.3 4.4  CL 104 104 105  CO2 23 24 24   GLUCOSE 122* 142* 123*  BUN 24* 26* 31*  CREATININE 1.17* 1.18* 1.16*  CALCIUM 9.0 9.2 9.4  MG 2.0 1.9  --   PHOS 3.5 2.9  --   PROT 6.2  --   --   ALBUMIN 2.4*  --   --   AST 36  --   --   ALT 24  --   --   ALKPHOS 119*  --   --   BILITOT 0.5  --   --   PREALBUMIN 6.1*  --   --   TRIG 46  --   --    Estimated Creatinine Clearance: 38.7 ml/min (by C-G formula based on Cr of 1.16).    Recent Labs  05/03/14 1144 05/03/14 1740 05/04/14 0823  GLUCAP 133* 167* 128*    Medications:  Scheduled:  . benzonatate  100 mg Oral BID  . cholecalciferol  1,000 Units Oral Daily  . fluconazole  (DIFLUCAN) IV  100 mg Intravenous Q24H  . furosemide  40 mg Intravenous Once  . insulin aspart  0-9 Units Subcutaneous TID WC  . lactose free nutrition  237 mL Oral TID WC & HS  . megestrol  400 mg Oral BID  . metroNIDAZOLE  500 mg Oral 3 times per day  . nystatin  5 mL Oral QID  . pantoprazole  80 mg Oral Daily  . sodium chloride  10-40 mL Intracatheter Q12H  . spironolactone  50 mg Oral Daily   Infusions:  . sodium chloride    . Marland KitchenTPN (CLINIMIX-E) Adult 70 mL/hr at 05/03/14 1739   And  . fat emulsion 250 mL (05/03/14 1739)   Insulin Requirements in the past 24 hours:  3 units  Current Nutrition:  Clinimix E 5/15 at 2ml/hr + 20% fat emulsion at 19ml/hr Carb modified - tolerating 20-50% Boost qid  IVF: NS at 79ml/hr  Assessment: 29 yoF admitted 8/3 after LOC at home with hx of nausea/loose stools at home. Previous admit 7/26 for cellulitis; received Rocephin x 1 dose,  Vancomycin x 3 doses, discharged on po Keflex. CDiff culture ordered 7/26, never obtained. Poor po intake PTA, 10 lb weight loss noted. Pharmacy consulted to dose TNA per Oncology request due to low pre-albumin levels.   Glucose - 123-128 on current.  Electrolytes - wnl  LFTs - wnl(8/10)  TGs - 46(8/10)  Prealbumin - 6.1(8/10)  Significant events: 8/11: TNA advanced to goal. Flexiseal dc'd. 8/12: Diflucan for thrush. Megace started.  Nutritional Goals:  RD recs 8/5: 75-85 grams of protein and 1550-1750 kCal per day. Clinimix E 5/15 at a goal rate of 61ml/hr + 20% fat emulsion at 51ml/hr to provide: 84g/day protein, 1670Kcal/day.  TPN Access: PICC TPN day#: 5  Plan:   Cont Clinimix E 5/15 at 1ml/hr.  Cont 20% fat emulsion at 95ml/hr.  TNA to contain standard multivitamins and trace elements.  Cont NS at 66ml/hr.  Cont SSI sensitive TIDAC .   TNA lab panels on Mondays & Thursdays.  F/u daily.  Romeo Rabon, PharmD, pager [phone]. 05/04/2014,11:09 AM.

## 2014-05-04 NOTE — Progress Notes (Signed)
Occupational Therapy Treatment Patient Details Name: [name] MRN: [id number] DOB: May 27, 1928 Today's Date: 05/04/2014    History of present illness 80 yo female admitted with fall, diarrhea, LE cellulitis. Hx of HTN, cirrhosis. Pt lives alone in [place].    OT comments  Pt appearing fatigued, stated she coughed all night and did not rest well.  Cough, bowel incontinence, and pain around rectum interfering with ability to participate maximally, although pt is certainly willing. Performed standing for clean up x 2 and sat for grooming. Unable to tolerate sitting in chair due to pain. Pt grateful for help. Returned pt to bed.  Follow Up Recommendations  SNF    Equipment Recommendations       Recommendations for Other Services      Precautions / Restrictions Precautions Precautions: Fall Precaution Comments: bowel frequency/incontinence       Mobility Bed Mobility Overal bed mobility: Needs Assistance Bed Mobility: Rolling;Sidelying to Sit;Sit to Supine Rolling: Min assist Sidelying to sit: Min assist   Sit to supine: Min assist   General bed mobility comments: Assist for trunk and LEs, and to manage lines/tube. Increased time.  Transfers   Equipment used: Conservation officer, nature (2 wheeled) Transfers: Risk manager;Sit to/from Stand Sit to Stand: Min assist Stand pivot transfers: Min assist       General transfer comment: assist to power up and gain balance initially    Balance                                   ADL Overall ADL's : Needs assistance/impaired     Grooming: Wash/dry hands;Wash/dry face;Brushing hair;Sitting;Supervision/safety Grooming Details (indicate cue type and reason): unable to tolerate standing at sink today                 Toilet Transfer: Minimal assistance;Stand-pivot;RW Armed forces technical officer Details (indicate cue type and reason): pt with bowel incontinence with coughing Toileting- Clothing Manipulation and Hygiene:  Total assistance;Sit to/from stand       Functional mobility during ADLs: Minimal assistance;Rolling walker (transfers) General ADL Comments: Pt with good effort.      Vision                     Perception     Praxis      Cognition   Behavior During Therapy: WFL for tasks assessed/performed Overall Cognitive Status: Within Functional Limits for tasks assessed                       Extremity/Trunk Assessment               Exercises     Shoulder Instructions       General Comments      Pertinent Vitals/ Pain       Pain Assessment: 0-10 Pain Score:  (did not rate) Pain Location: rectal area Pain Descriptors / Indicators: Constant Pain Intervention(s): Limited activity within patient's tolerance;Repositioned  Home Living                                          Prior Functioning/Environment              Frequency Min 2X/week     Progress Toward Goals  OT Goals(current goals can now be found in the care  plan section)  Progress towards OT goals: Not progressing toward goals - comment (weaker, pain, cough)  Acute Rehab OT Goals Patient Stated Goal: to get stronger. return home.   Plan Discharge plan remains appropriate    Co-evaluation                 End of Session Equipment Utilized During Treatment: Rolling walker   Activity Tolerance Patient limited by fatigue;Patient limited by pain (bowel incontinence)   Patient Left in bed;with call bell/phone within reach   Nurse Communication  (PA aware of pt's coughing )        Time: 1031-5945 OT Time Calculation (min): 36 min  Charges: OT General Charges $OT Visit: 1 Procedure OT Treatments $Self Care/Home Management : 23-37 mins  Malka So 05/04/2014, 10:09 AM [phone]

## 2014-05-04 NOTE — Procedures (Signed)
US guided therapeutic right thoracentesis performed yielding 900 cc's slightly turbid, amber fluid. F/u CXR pending. No immediate complications.

## 2014-05-04 NOTE — Progress Notes (Signed)
Wasted 2.5 ml of Tussionex in sink. Witnessed by Wilhemina Cash, RN.

## 2014-05-04 NOTE — Plan of Care (Signed)
Problem: Phase II Progression Outcomes Goal: Tolerating diet Outcome: Not Met (add Reason) Patient with decreased po intake. Patient with thrush in mouth as well.

## 2014-05-04 NOTE — Progress Notes (Signed)
[name]   DOB:14-Apr-1928   SA#:[id number]   NAT#:[id number]  Subjective: [name] still is not eating much, as decreased appetite exacerbated C diff colitis. Rectal tube taken out on 8/11. She is on TNA. Dietary is following. Megace started on 8/10.  Ambulating with  PT. Cough improved with tussionex. No shortness of breath.   Scheduled Meds: . benzonatate  100 mg Oral BID  . cholecalciferol  1,000 Units Oral Daily  . insulin aspart  0-9 Units Subcutaneous TID WC  . ketorolac      . lactose free nutrition  237 mL Oral TID WC & HS  . megestrol  400 mg Oral BID  . metroNIDAZOLE  500 mg Oral 3 times per day  . pantoprazole  80 mg Oral Daily  . sodium chloride  10-40 mL Intracatheter Q12H  . spironolactone  50 mg Oral Daily   Continuous Infusions: . sodium chloride    . Marland KitchenTPN (CLINIMIX-E) Adult 70 mL/hr at 05/03/14 1739   And  . fat emulsion 250 mL (05/03/14 1739)   PRN Meds:.ALPRAZolam, cyclobenzaprine, ipratropium, levalbuterol, lip balm, morphine injection, nitroGLYCERIN, sodium chloride, traMADol, triamcinolone cream   Objective:  Filed Vitals:   05/04/14 0532  BP: 124/59  Pulse: 86  Temp: 98.2 F (36.8 C)  Resp: 15      Intake/Output Summary (Last 24 hours) at 05/04/14 0913 Last data filed at 05/04/14 0630  Gross per 24 hour  Intake   1047 ml  Output   1300 ml  Net   -253 ml     GENERAL:alert, no distress and comfortable SKIN: skin color, texture, turgor are normal, no rashes or significant lesions EYES: normal, conjunctiva are pink and non-injected, sclera clear LUNGS: clear to auscultation and percussion with normal breathing effort HEART: regular rate & rhythm and no murmurs and no lower extremity edema ABDOMEN:abdomen somewhat distended, non-tender and normal bowel sounds Musculoskeletal:no cyanosis of digits and no clubbing  PSYCH: alert & oriented x 3 with fluent speech NEURO: no focal motor/sensory deficits    CBG (last 3)   Recent Labs   05/03/14 1144 05/03/14 1740 05/04/14 0823  GLUCAP 133* 167* 128*     Labs:   Recent Labs Lab 04/30/14 0956 05/01/14 0539 05/02/14 0600  WBC 7.3 7.6 9.2  HGB 12.7 11.9* 12.6  HCT 37.0 34.8* 36.9  PLT 133* 134* 134*  MCV 91.8 92.8 93.2  MCH 31.5 31.7 31.8  MCHC 34.3 34.2 34.1  RDW 13.4 13.6 13.8  LYMPHSABS  --  2.0 2.3  MONOABS  --  0.7 1.1*  EOSABS  --  0.6 0.6  BASOSABS  --  0.1 0.1     Chemistries:    Recent Labs Lab 04/30/14 0543 05/01/14 0539 05/02/14 0600 05/03/14 0500 05/04/14 0615  NA 138 138 138 136* 137  K 3.5* 3.4* 3.9 4.3 4.4  CL 107 107 104 104 105  CO2 19 22 23 24 24   GLUCOSE 93 124* 122* 142* 123*  BUN 25* 25* 24* 26* 31*  CREATININE 1.32* 1.18* 1.17* 1.18* 1.16*  CALCIUM 8.6 8.6 9.0 9.2 9.4  MG  --  2.0 2.0 1.9  --   AST  --  36 36  --   --   ALT  --  26 24  --   --   ALKPHOS  --  111 119*  --   --   BILITOT  --  0.6 0.5  --   --     GFR Estimated  Creatinine Clearance: 38.7 ml/min (by C-G formula based on Cr of 1.16).  Liver Function Tests:  Recent Labs Lab 05/01/14 0539 05/02/14 0600  AST 36 36  ALT 26 24  ALKPHOS 111 119*  BILITOT 0.6 0.5  PROT 5.7* 6.2  ALBUMIN 2.3* 2.4*   Urine Studies     Component Value Date/Time   COLORURINE AMBER* 04/25/2014 1840   APPEARANCEUR CLOUDY* 04/25/2014 1840   LABSPEC 1.026 04/25/2014 1840   PHURINE 5.0 04/25/2014 1840   GLUCOSEU NEGATIVE 04/25/2014 1840   HGBUR NEGATIVE 04/25/2014 [place] 04/25/2014 [place] 04/25/2014 1840   PROTEINUR NEGATIVE 04/25/2014 1840   UROBILINOGEN 0.2 04/25/2014 1840   NITRITE NEGATIVE 04/25/2014 1840   LEUKOCYTESUR TRACE* 04/25/2014 1840    Coagulation profile  Recent Labs Lab 04/30/14 0956  INR 1.35    Cardiac Enzymes: No results found for this basename: CKTOTAL, CKMB, CKMBINDEX, TROPONINI,  in the last 168 hours BNP: No components found with this basename: POCBNP,  CBG:  Recent Labs Lab 05/02/14 1621 05/03/14 0720  05/03/14 1144 05/03/14 1740 05/04/14 0823  GLUCAP 203* 141* 133* 167* 128*       Imaging Studies:  Dg Chest 1 View  05/02/2014   CLINICAL DATA:  Status post right-sided thoracentesis  EXAM: CHEST - 1 VIEW  COMPARISON:  One day prior  FINDINGS: Patient rotated left.  Right-sided PICC line unchanged.  Cardiomegaly accentuated by AP portable technique. Probable small left pleural effusion. No pneumothorax. Clearing of the right-sided pleural effusion. Left greater than right bibasilar airspace disease.  IMPRESSION: Resolution of right pleural effusion, without pneumothorax.  Bibasilar airspace disease which could represent atelectasis or infection.   Electronically Signed   By: Abigail Miyamoto M.D.   On: 05/02/2014 11:27   US Abdomen Limited  05/02/2014   CLINICAL DATA:  Ascites, cirrhosis  EXAM: LIMITED ABDOMEN ULTRASOUND FOR ASCITES  TECHNIQUE: Limited ultrasound survey for ascites was performed in all four abdominal quadrants.  COMPARISON:  None.  FINDINGS: There is a small amount of abdominal free fluid. There is no significant amount of ascites to perform a therapeutic paracentesis.  IMPRESSION: No significant amount of ascites to perform a therapeutic paracentesis.   Electronically Signed   By: Kathreen Devoid   On: 05/02/2014 11:19   Dg Abd Acute W/chest  05/03/2014   CLINICAL DATA:  Suspect fluid build-up within the abdomen  EXAM: ACUTE ABDOMEN SERIES (ABDOMEN 2 VIEW & CHEST 1 VIEW)  COMPARISON:  Chest x-ray of May 02, 2014  FINDINGS: The bowel gas pattern is nonspecific. There is no evidence of ileus nor of obstruction. No free extraluminal gas collections are demonstrated. There is blunting of the left lateral costophrenic gutter and to a lesser extent the right lateral costophrenic gutter consistent with a small amount of pleural fluid. There is not been dramatic increase in the fluid volume since yesterday's chest x-ray. The interstitial markings remain increased in the mid and lower lobes  bilaterally. There is external overlying material which increases the density here on the right.  IMPRESSION: 1. There is no evidence of bowel obstruction, ileus, or perforation. 2. There are small amounts of pleural fluid bilaterally. Increased interstitial density on the right suggests mild interstitial edema.   Electronically Signed   By: David  Martinique   On: 05/03/2014 17:20   US Thoracentesis Asp Pleural Space W/img Guide  05/02/2014   CLINICAL DATA:  Hepatocellular carcinoma, dyspnea, recurrent right pleural effusion. Request is made for  diagnostic and therapeutic right thoracentesis.  EXAM: ULTRASOUND GUIDED DIAGNOSTIC AND THERAPEUTIC RIGHT THORACENTESIS  COMPARISON:  PRIOR THORACENTESIS ON 04/29/2014  PROCEDURE: An ultrasound guided thoracentesis was thoroughly discussed with the patient and questions answered. The benefits, risks, alternatives and complications were also discussed. The patient understands and wishes to proceed with the procedure. Written consent was obtained.  Ultrasound was performed to localize and mark an adequate pocket of fluid in the right chest. The area was then prepped and draped in the normal sterile fashion. 1% Lidocaine was used for local anesthesia. Under ultrasound guidance a 19 gauge Yueh catheter was introduced. Thoracentesis was performed. The catheter was removed and a dressing applied.  Complications:  None.  FINDINGS: A total of approximately 1.1 liters of yellow fluid was removed. The fluid sample was sent for cytology .  IMPRESSION: Successful ultrasound guided diagnostic and therapeutic right thoracentesis yielding 1.1 liters of pleural fluid.  Read by: Rowe Robert, PA-C   Electronically Signed   By: Aletta Edouard M.D.   On: 05/02/2014 13:58    Assessment/Plan: 80 y.o. 1. History of hepatocellular carcinoma.  She has chronic active hepatitis with steatosis, with advanced fibrosis.  She has had a radiofrequency ablation procedure. This was done in May of 2014.  This was fairly successful. She has had no subsequent problems from this. A CT of the abdomen and pelvis was done on April 28, 2014. This showed liver lesion in the right lobe of the liver. It measured 2.5 cm. It previously measured 2.1 cm. She had a  moderate right pleural effusion. She had portal hypertension. She has abdominal ascites. She had large right hiatal hernia. She has some retroperitoneal lymphadenopathy. Paracentesis may be needed, but appears to be as transudate secondary to cirrhosis (NA[facility]). At this time there was not enough fluid to drain.   2. Ascites/pleural effusion. S/p right thoracentesis on 8/10 yielding 1. 1 l of pleural fluid  3. Iron deficiency anemia This has been from intermittent GI bleeding from gastrointestinal AV malformations and angiodysplasia.  4. C difficile Colitis She received IV fluids and flagyl. Rectal tube out as of 8/11  No diarrhea today.   5. Left lower extremity cellulitis Placed on keflex by primary team.   6. Malnutrition On TNA and Megace  7. Full Code  Other medical issues as per admitting team     **Disclaimer: This note was dictated with voice recognition software. Similar sounding words can inadvertently be transcribed and this note may contain transcription errors which may not have been corrected upon publication of note.** WERTMAN,SARA E, PA-C 05/04/2014  9:13 AM   ADDENDUM:  I am puzzled as to why she continues to have the fluid building up around the right lung. She had another thoracentesis today and 900 cc of fluid was removed.  I would think that it is fluid was from her cirrhosis, that there should be some ascites that would be building up. She tried to have a paracentesis a day or so ago and very little fluid was removed.  She's had cytology done on 2 prior pleural effusions and these were negative.  She still is on TNA. This really is what is helping her for right now. I am unsure how much she really is  eating.  Her labs don't look too bad. We're still awaiting the urine culture results.  I wonder if a CT of the chest might help to see if there is anything with the lung or the pleural that might be  causing this effusion to recur.  I think it would be tough to let her go when this pleural effusion keeps recurring.  Ultimately, the diagnostic tests of choice would be a thoracoscopic procedure to look at the pleural lining. This will would be surgery and I think this would really be the test of last choice given the invasiveness of it.

## 2014-05-05 ENCOUNTER — Inpatient Hospital Stay (HOSPITAL_COMMUNITY): Payer: Medicare Other

## 2014-05-05 LAB — CBC
HCT: 36.7 % (ref 36.0–46.0)
Hemoglobin: 12.5 g/dL (ref 12.0–15.0)
MCH: 31.7 pg (ref 26.0–34.0)
MCHC: 34.1 g/dL (ref 30.0–36.0)
MCV: 93.1 fL (ref 78.0–100.0)
PLATELETS: 155 10*3/uL (ref 150–400)
RBC: 3.94 MIL/uL (ref 3.87–5.11)
RDW: 14.5 % (ref 11.5–15.5)
WBC: 8.8 10*3/uL (ref 4.0–10.5)

## 2014-05-05 LAB — COMPREHENSIVE METABOLIC PANEL
ALT: 19 U/L (ref 0–35)
ANION GAP: 8 (ref 5–15)
AST: 35 U/L (ref 0–37)
Albumin: 2.2 g/dL — ABNORMAL LOW (ref 3.5–5.2)
Alkaline Phosphatase: 123 U/L — ABNORMAL HIGH (ref 39–117)
BUN: 37 mg/dL — AB (ref 6–23)
CALCIUM: 9.9 mg/dL (ref 8.4–10.5)
CO2: 25 mEq/L (ref 19–32)
Chloride: 104 mEq/L (ref 96–112)
Creatinine, Ser: 1.33 mg/dL — ABNORMAL HIGH (ref 0.50–1.10)
GFR calc non Af Amer: 36 mL/min — ABNORMAL LOW (ref >90)
GFR, EST AFRICAN AMERICAN: 42 mL/min — AB (ref >90)
Glucose, Bld: 122 mg/dL — ABNORMAL HIGH (ref 70–99)
POTASSIUM: 4.8 meq/L (ref 3.7–5.3)
SODIUM: 137 meq/L (ref 137–147)
TOTAL PROTEIN: 6.3 g/dL (ref 6.0–8.3)
Total Bilirubin: 0.6 mg/dL (ref 0.3–1.2)

## 2014-05-05 LAB — GLUCOSE, CAPILLARY
Glucose-Capillary: 126 mg/dL — ABNORMAL HIGH (ref 70–99)
Glucose-Capillary: 165 mg/dL — ABNORMAL HIGH (ref 70–99)
Glucose-Capillary: 115 mg/dL — ABNORMAL HIGH (ref 70–99)

## 2014-05-05 LAB — MAGNESIUM: Magnesium: 1.9 mg/dL (ref 1.5–2.5)

## 2014-05-05 LAB — PHOSPHORUS: Phosphorus: 3.7 mg/dL (ref 2.3–4.6)

## 2014-05-05 MED ORDER — TRACE MINERALS CR-CU-F-FE-I-MN-MO-SE-ZN IV SOLN
INTRAVENOUS | Status: AC
  Administered 2014-05-05: 17:00:00 via INTRAVENOUS
  Filled 2014-05-05: qty 2000

## 2014-05-05 MED ORDER — BOOST PLUS PO LIQD
237.0000 mL | Freq: Three times a day (TID) | ORAL | Status: DC
  Administered 2014-05-05 – 2014-05-11 (×16): 237 mL via ORAL
  Filled 2014-05-05 (×27): qty 237

## 2014-05-05 MED ORDER — FAT EMULSION 20 % IV EMUL
250.0000 mL | INTRAVENOUS | Status: AC
  Administered 2014-05-05: 250 mL via INTRAVENOUS
  Filled 2014-05-05: qty 250

## 2014-05-05 NOTE — Progress Notes (Signed)
CSW continuing to follow for disposition planning.  Plan is to transitioned her off TNA and hopefully to [facility] per family preference.   CSW sent updated clinicals to [facility] today and facility has bed available once pt medically stable for discharge.   CSW discussed with pt at bedside and pt is pleased that [facility] will be an option once pt transitions off TNA.   CSW to continue to follow.  [name], MSW, [facility] [phone]

## 2014-05-05 NOTE — Progress Notes (Addendum)
Patient ID: [name], female   DOB: [birth year], 80 y.o.   MRN: [id number] [facility] PROGRESS NOTE  MIREYA MEDITZ TGY:[id number] DOB: 1928-04-09 DOA: 04/25/2014 PCP: Alesia Richards, MD  Brief narrative: 80 y.o. female with past medical history of hypertension, prior history of MI, TIA, hepatoma who presented to [facility] ED 04/25/2014 status post fall at home. Pt also reported nausea and vomiting and having loose bowel movements. On admission, x-ray studies were done of the lumbar spine and pelvis but did not reveal acute fractures. CT head and CT cervical spine did not show acute intracranial findings. Initial chest x-ray showed no acute cardiopulmonary process. Blood work revealed mild hyponatremia, sodium 136. Creatinine was 1.45 and calcium 10.6. Urinalysis showed only trace leukocytes. Hospital course has been complicated due to findings of C.diff on stool PCR for which reason she was started on flagyl. She required flexiseal due to ongoing diarrhea. Flexiseal was removed 05/03/2014. In addition, she continues to have pleural effusion accumulation on the right side which based on cytology results is negative for malignancy. She underwent 3 US guided thoracenteses so far with total of 3.2 L fluid drained. Her abdomen is softly distended and 2 abdominal US showed not significant amount of ascites to be drained.  Pt requires TNA for nutritional support. Plan is to transitioned her off TNA and hopefully to [facility]F per family preference.   Assessment/Plan:   Principal Problem:  Acute respiratory failure with hypoxia / Right pleural effusion  Please note that the initial CXR did not show acute cardiopulmonary findings. We obtained CT chest 04/28/2014 for evaluation of abdominal distention but her respiratory status was stable. On 04/29/2014, pt felt more short of breath at which time we obtained CXR which showed large right pleural effusion and then pt underwent US guided thoracentesis with 1.2 L fluid  removed. Cytology results were negative for malignancy. Fluid culture showed no growth. Considering her very severe malnutrition most likely re accumulation of right pleural effusion is from malnutrition. She again had US guided thoracentesis on 05/02/2014 with 1.1 L fluid removed. Repeat CXR 05/03/2014 showed improvement in pleural effusion but there was now bilateral pleural effusion concerning for interstitial edema. Pt has received bolus lasix 40 mg IV 05/04/2014. She underwent 3rd US guided thoracentesis with 900 cc fluid drained. Of note, patient was given one dose of Lasix on 04/29/2014, 20 mg IV  We will continue Tessalon capsules for cough twice daily. Continue bronchodilator treatments, Xopenex and Atrovent every 6 hours as needed for shortness of breath or wheezing. Active Problems:  Abdominal ascites   Seen on CT abdomen / pelvis. Paracentesis attempted 2 times and per IR not significant amount of ascites see. Abd US done 05/02/2014 sis not show significant amount of ascites. Fall, weakness   Likely due to dehydration. Per PT evaluation, pt requires SNFon discharge and she and her family are agreeable to SNF placement.  Oral thrush   Start fluconazole and nystatin swish and swallow 05/04/2014. Oral thrush is improving. Hypertension  Blood pressure at goal. All antihypertensives on hold except for spironolactone. C.diff enteritis /Abdominal distention  Started flagyl 04/26/2014; due to ongoing diarrhea flexiseal was inserted 1 day after the admission. Discontinued Flexiseal 05/03/2014 Acute renal failure  Likely secondary to dehydration  Creatinine ranges from 1.4 to 1.16. Slight worsening in creatinine over past 24 hours likely due to lasix. Hypokalemia  Likely due to GI losses and lasix. Repleted and WNL. Hypercalcemia  Likely due to dehydration. Resolved with IV  fluids. Severe protein calorie malnutrition  Nutrition consulted  Severely low prealbumin.  TPN ordered per oncology  recommendations. DVT Prophylaxis  SCD's due to initial mild thrombocytopenia. Platelet count normalized as of today 05/05/2014.   Code Status: Full.  Family Communication: plan of care discussed with the patient and her family at the bedside  Disposition Plan: SNF on discharge; hopefully we can wean off of TNA   IV Access:   Peripheral IV  PICC Procedures and diagnostic studies:   Dg Chest 2 View 04/25/2014 IMPRESSION: No acute cardiopulmonary process. Cardiomegaly with mild central vascular congestion reidentified.  Dg Lumbar Spine Complete 04/25/2014 IMPRESSION: No acute osseous abnormality of the lumbar spine  Dg Pelvis 1-2 Views8/11/2013 IMPRESSION: Negative.  Ct Head Wo Contrast 04/25/2014 IMPRESSION: CT head: No acute intracranial process. Normal noncontrast CT of the head for age. CT cervical spine: Straightened cervical lordosis without acute fracture. Grade 1 C5-6 anterolisthesis on degenerative basis.  Ct Cervical Spine Wo Contrast 04/25/2014 IMPRESSION: CT head: No acute intracranial process. Normal noncontrast CT of the head for age. CT cervical spine: Straightened cervical lordosis without acute fracture. Grade 1 C5-6 anterolisthesis on degenerative basis.  Dg Chest [facility] 04/25/2014 IMPRESSION: Retrocardiac opacity which could represent atelectasis given the degree of hypoaeration although pneumonia could appear similar. If symptoms persist, consider PA and lateral chest radiographs obtained at full inspiration when the patient is clinically able.  CT abdomen 04/28/2014 Morphologic features of the liver compatible with cirrhosis with stigmata of portal venous hypertension. 2. Increase in abdominal ascites. 3. Hepatoma site within the segment 6 of the liver is slightly increased in size from previous exam. 4. Large right pleural effusion. 5. Large hiatal hernia 6. Atherosclerotic disease 7. Prominent retroperitoneal adenopathy is nonspecific in the setting of cirrhosis and portal venous hypertension.  This appears similar to previous exam. 8. Wall thickening of the colon is also nonspecific in the setting of portal venous hypertension. This may be due to a hypoproteinemic state or colitis.  Dg Chest 04/29/2014 Marked improvement RIGHT pleural effusion. No pneumothorax.  Dg Chest [facility] 04/29/2014 Significant increase in a right-sided pleural effusion when compared with the prior exam. Hiatal hernia.  US Thoracentesis Asp Pleural Space W/img Guide 04/29/2014 Successful ultrasound guided diagnostic and therapeutic right thoracentesis yielding 1.2 liters of pleural fluid.  TPN 04/30/2014  Dg Chest Port 05/01/2014 1.Interval placement of right upper extremity PICC with tip terminating in the proximal superior vena cava. 2. Interval accumulation of a moderate to large right-sided pleural effusion. There is also a small left pleural effusion. 3. Bibasilar atelectasis and/or consolidation.  US Thoracentesis Asp Pleural Space W/img Guide 05/02/2014 Successful ultrasound guided diagnostic and therapeutic right thoracentesis yielding 1.1 liters of pleural fluid.  Dg Chest 05/02/2014 Resolution of right pleural effusion, without pneumothorax. Bibasilar airspace disease which could represent atelectasis or infection.  US Abdomen 05/02/2014 No significant amount of ascites to perform a therapeutic paracentesis.  DG abdomen 05/03/2014 - no bowel obstruction, small bilateral pleural effusion, on right seems to be resolving; possible interstitial edema US Thoracentesis Pleural Space 05/04/2014 Successful ultrasound guided therapeutic right thoracentesis yielding 900 cc's of pleural fluid.  Medical Consultants:   IR for thoracentesi  Oncology (Dr. Burney Gauze) Other Consultants:   Nutrition  Physical therapy  Anti-Infectives:   Flagyl 04/26/2014 -->  Fluconazole 05/04/2014 -->      Leisa Lenz, MD  [facility] Pager [phone]  If 7PM-7AM, please contact night-coverage [email or web address] Password [facility] 05/05/2014,  9:46 AM   LOS: 10  days    HPI/Subjective: No acute overnight events.  Objective: Filed Vitals:   05/04/14 1542 05/04/14 1957 05/04/14 2220 05/05/14 0527  BP: 105/58 126/55  128/53  Pulse:  99 76 95  Temp:  98.8 F (37.1 C)  98.2 F (36.8 C)  TempSrc:  Oral    Resp:  18  25  Height:      Weight:      SpO2:  98%  94%    Intake/Output Summary (Last 24 hours) at 05/05/14 0946 Last data filed at 05/05/14 0530  Gross per 24 hour  Intake   2967 ml  Output   1950 ml  Net   1017 ml    Exam:   General:  Pt is alert, follows commands appropriately, not in acute distress; oral thrush improving   Cardiovascular: Regular rate and rhythm, S1/S2, no murmurs  Respiratory: diminished breath sounds right more than left, no wheezing   Abdomen: Soft, non tender, distended, bowel sounds present  Extremities: No edema, pulses DP and PT palpable bilaterally  Neuro: Grossly nonfocal  Data Reviewed: Basic Metabolic Panel:  Recent Labs Lab 05/01/14 0539 05/02/14 0600 05/03/14 0500 05/04/14 0615 05/05/14 0500  NA 138 138 136* 137 137  K 3.4* 3.9 4.3 4.4 4.8  CL 107 104 104 105 104  CO2 22 23 24 24 25   GLUCOSE 124* 122* 142* 123* 122*  BUN 25* 24* 26* 31* 37*  CREATININE 1.18* 1.17* 1.18* 1.16* 1.33*  CALCIUM 8.6 9.0 9.2 9.4 9.9  MG 2.0 2.0 1.9  --  1.9  PHOS 2.5 3.5 2.9  --  3.7   Liver Function Tests:  Recent Labs Lab 05/01/14 0539 05/02/14 0600 05/05/14 0500  AST 36 36 35  ALT 26 24 19   ALKPHOS 111 119* 123*  BILITOT 0.6 0.5 0.6  PROT 5.7* 6.2 6.3  ALBUMIN 2.3* 2.4* 2.2*   No results found for this basename: LIPASE, AMYLASE,  in the last 168 hours No results found for this basename: AMMONIA,  in the last 168 hours CBC:  Recent Labs Lab 04/30/14 0956 05/01/14 0539 05/02/14 0600 05/05/14 0500  WBC 7.3 7.6 9.2 8.8  NEUTROABS  --  4.2 5.1  --   HGB 12.7 11.9* 12.6 12.5  HCT 37.0 34.8* 36.9 36.7  MCV 91.8 92.8 93.2 93.1  PLT 133* 134* 134* 155    Cardiac Enzymes: No results found for this basename: CKTOTAL, CKMB, CKMBINDEX, TROPONINI,  in the last 168 hours BNP: No components found with this basename: POCBNP,  CBG:  Recent Labs Lab 05/03/14 1740 05/04/14 0823 05/04/14 1217 05/04/14 1621 05/05/14 0749  GLUCAP 167* 128* 178* 152* 126*    Recent Results (from the past 240 hour(s))  URINE CULTURE     Status: None   Collection Time    04/25/14  6:40 PM      Result Value Ref Range Status   Specimen Description URINE, RANDOM   Final   Special Requests NONE   Final   Culture  Setup Time     Final   Value: 04/26/2014 05:55     Performed at [facility] Count     Final   Value: NO GROWTH     Performed at [facility]   Culture     Final   Value: NO GROWTH     Performed at [facility]   Report Status 04/27/2014 FINAL   Final  CULTURE, BLOOD (ROUTINE X 2)  Status: None   Collection Time    04/25/14  6:46 PM      Result Value Ref Range Status   Specimen Description BLOOD BLOOD RIGHT FOREARM   Final   Special Requests BOTTLES DRAWN AEROBIC AND ANAEROBIC 3ML   Final   Culture  Setup Time     Final   Value: 04/25/2014 22:52     Performed at [facility]   Culture     Final   Value: NO GROWTH 5 DAYS     Performed at [facility]   Report Status 05/01/2014 FINAL   Final  CULTURE, BLOOD (ROUTINE X 2)     Status: None   Collection Time    04/25/14  6:48 PM      Result Value Ref Range Status   Specimen Description BLOOD RIGHT ANTECUBITAL   Final   Special Requests BOTTLES DRAWN AEROBIC AND ANAEROBIC 3ML   Final   Culture  Setup Time     Final   Value: 04/25/2014 22:51     Performed at [facility]   Culture     Final   Value: NO GROWTH 5 DAYS     Performed at [facility]   Report Status 05/01/2014 FINAL   Final  STOOL CULTURE     Status: None   Collection Time    04/25/14 11:36 PM      Result Value Ref Range Status   Specimen Description STOMA    Final   Special Requests Normal   Final   Culture     Final   Value: NO SALMONELLA, SHIGELLA, CAMPYLOBACTER, YERSINIA, OR E.COLI 0157:H7 ISOLATED     Performed at [facility]   Report Status 04/29/2014 FINAL   Final  URINE CULTURE     Status: None   Collection Time    04/25/14 11:38 PM      Result Value Ref Range Status   Specimen Description URINE, CLEAN CATCH   Final   Special Requests Normal   Final   Culture  Setup Time     Final   Value: 04/26/2014 05:55     Performed at [facility]     Final   Value: 50,000 COLONIES/ML     Performed at [facility]   Culture     Final   Value: KLEBSIELLA ORNITHINOLYTICA     Performed at [facility]   Report Status 04/28/2014 FINAL   Final   Organism ID, Bacteria KLEBSIELLA ORNITHINOLYTICA   Final  CLOSTRIDIUM DIFFICILE BY PCR     Status: Abnormal   Collection Time    04/26/14  5:19 PM      Result Value Ref Range Status   C difficile by pcr POSITIVE (*) NEGATIVE Final   Comment: CRITICAL RESULT CALLED TO, READ BACK BY AND VERIFIED WITH:     Gay Filler RN 04/27/14 0734 COSTELLO B     Performed at [facility]  BODY FLUID CULTURE     Status: None   Collection Time    04/29/14  2:33 PM      Result Value Ref Range Status   Specimen Description FLUID RIGHT PLEURAL EFFUSION   Final   Special Requests Normal   Final   Gram Stain     Final   Value: NO WBC SEEN     NO ORGANISMS SEEN     Performed at [facility]  Final   Value: NO GROWTH 3 DAYS     Performed at [facility]   Report Status 05/02/2014 FINAL   Final  URINE CULTURE     Status: None   Collection Time    05/03/14 11:50 AM      Result Value Ref Range Status   Specimen Description URINE, CATHETERIZED   Final   Special Requests flagyl Immunocompromised   Final   Culture  Setup Time     Final   Value: 05/03/2014 14:36     Performed at [place]     Final   Value: NO  GROWTH     Performed at [facility]   Culture     Final   Value: NO GROWTH     Performed at [facility]   Report Status 05/04/2014 FINAL   Final     Scheduled Meds: . benzonatate  100 mg Oral BID  . cholecalciferol  1,000 Units Oral Daily  . fluconazole (DIFLUCAN) IV  100 mg Intravenous Q24H  . insulin aspart  0-9 Units Subcutaneous TID WC  . lactose free nutrition  237 mL Oral TID WC & HS  . megestrol  400 mg Oral BID  . metroNIDAZOLE  500 mg Oral 3 times per day  . nystatin  5 mL Oral QID  . pantoprazole  80 mg Oral Daily  . sodium chloride  10-40 mL Intracatheter Q12H  . spironolactone  50 mg Oral Daily   Continuous Infusions: . sodium chloride    . Marland KitchenTPN (CLINIMIX-E) Adult 70 mL/hr at 05/04/14 1723   And  . fat emulsion 250 mL (05/04/14 1723)

## 2014-05-05 NOTE — Progress Notes (Signed)
Patient assisted oob to [facility] to have bowel movement. Had a very large soft formed brown stool but did have some noted blood with stool--patient has hemorrhoids that were bleeding when wiped once done.

## 2014-05-05 NOTE — Progress Notes (Signed)
PARENTERAL NUTRITION CONSULT NOTE - Follow up  Pharmacy Consult for TNA Indication: severe malnutrition in setting of hepatocellular carcinoma, chronic active hepatitis, and C.diff - associated diarrhea  Allergies  Allergen Reactions  . Avelox [Moxifloxacin Hcl In Nacl] Anaphylaxis  . Statins Other (See Comments)    Liver function elevations  . Sulfa Antibiotics Hives and Itching  . Ace Inhibitors     Unknown  . Doxycycline     Unknown  . Infed [Iron Dextran] Other (See Comments)    IV IRON, Unknown  . Infergen [Interferon Alfacon-1]   . Vancomycin Other (See Comments)    Flushing, erythema to neck and face, tolerated vanc at lower infusion rate  . Ciprofloxacin Other (See Comments)    Complains of sore mouth  . Fenofibrate Other (See Comments)    constipation   Patient Measurements: Height: 5\' 6"  (167.6 cm) Weight: 171 lb 8 oz (77.792 kg) IBW/kg (Calculated) : 59.3 No updated weight since 8/3  Vital Signs: Temp: 98.2 F (36.8 C) (08/13 0527) BP: 128/53 mmHg (08/13 0527) Pulse Rate: 95 (08/13 0527) Intake/Output from previous day: 08/12 0701 - 08/13 0700 In: 3087 [P.O.:580; I.V.:10; IV Piggyback:50; MOQ:9476] Out: 1950 [Urine:1950] 5 stools recorded since yesterday 0600  Intake/Output from this shift:    Labs:  Recent Labs  05/05/14 0500  WBC 8.8  HGB 12.5  HCT 36.7  PLT 155    Recent Labs  05/03/14 0500 05/04/14 0615 05/05/14 0500  NA 136* 137 137  K 4.3 4.4 4.8  CL 104 105 104  CO2 24 24 25   GLUCOSE 142* 123* 122*  BUN 26* 31* 37*  CREATININE 1.18* 1.16* 1.33*  CALCIUM 9.2 9.4 9.9  MG 1.9  --  1.9  PHOS 2.9  --  3.7  PROT  --   --  6.3  ALBUMIN  --   --  2.2*  AST  --   --  35  ALT  --   --  19  ALKPHOS  --   --  123*  BILITOT  --   --  0.6  Corrected calcium: 11.3 Estimated Creatinine Clearance: 33.7 ml/min (by C-G formula based on Cr of 1.33).    Recent Labs  05/04/14 1217 05/04/14 1621 05/05/14 0749  GLUCAP 178* 152* 126*     Medications:  Scheduled:  . benzonatate  100 mg Oral BID  . cholecalciferol  1,000 Units Oral Daily  . fluconazole (DIFLUCAN) IV  100 mg Intravenous Q24H  . insulin aspart  0-9 Units Subcutaneous TID WC  . lactose free nutrition  237 mL Oral TID WC & HS  . megestrol  400 mg Oral BID  . metroNIDAZOLE  500 mg Oral 3 times per day  . nystatin  5 mL Oral QID  . pantoprazole  80 mg Oral Daily  . sodium chloride  10-40 mL Intracatheter Q12H  . spironolactone  50 mg Oral Daily   Infusions:  . sodium chloride    . Marland KitchenTPN (CLINIMIX-E) Adult 70 mL/hr at 05/04/14 1723   And  . fat emulsion 250 mL (05/04/14 1723)   Insulin Requirements in the past 24 hours:  6 units sliding scale Novolog; on sensitive-scale SSI TID  Current Nutrition:  Clinimix E 5/15 at 9ml/hr + 20% fat emulsion at 27ml/hr Carb modified diet Boost Plus q.i.d.  IVF: NS at 71ml/hr  Nutritional Goals:  RD recs 8/5: 75-85 grams of protein and 1550-1750 kCal per day. Clinimix E 5/15 at a goal rate of 80ml/hr +  20% fat emulsion at 33ml/hr provides 84g/day protein, 1672 Kcal/day.   Assessment: 45 yoF with hepatocellular carcinoma and chronic active hepatitis admitted 8/3 after LOC at home associated with recent nausea and loose stools. Was previously admitted 7/26 for cellulitis; received Rocephin x 1 dose, vancomycin x 3 doses, discharged on po Keflex. Stool tested positive for C.diff on 8/4. Poor po intake PTA, recent 10 lb weight loss noted. Orders were received from oncology to begin TNA 8/8 for malnutrition.  Pharmacy dosing assistance was requested.  Significant events: 8/11: TNA advanced to goal. Flexiseal dc'd. Megace started for appetite stimulation. 8/12: Diflucan started for thrush.  8/12: R thoracentesis for drainage of pleural effusion     8/13: D#6 TPN TPN Access:  PICC (placed 8/8)  Labs:  Electrolytes - WNL  Renal: SCr and BUN rising; estimated CrCl 33 mL/min  Hepatic: Alk Phos up slightly  but < 1.5 x ULN.  Other LFTs below ULN.  TGs - WNL, last checked 8/10  Prealbumin - 6.1 (8/10)  Dietary intake remains poor per charting. Has continued to have frequent stools.   Underwent thoracentesis yesterday with drainage of 900 mL.   Plan:   Cont Clinimix E 5/15 at 103ml/hr.  Cont 20% fat emulsion at 45ml/hr.  TNA to contain standard multivitamins and trace elements.  Cont carbohydrate-modified diet.  Remains on NS at 70ml/hr for now; will defer to MD on any adjustments in maintenance IVF rate.  Cont SSI sensitive TID .   Recheck BMet in AM - watch SCr, BUN.  Follow tolerance of diet.  TNA lab panel on Mondays & Thursdays.  Clayburn Pert, PharmD, BCPS Pager: [phone] 05/05/2014  9:45 AM

## 2014-05-05 NOTE — Progress Notes (Signed)
  Echocardiogram 2D Echocardiogram has been performed.  Zayah Keilman 05/05/2014, 10:03 AM

## 2014-05-05 NOTE — Progress Notes (Signed)
NUTRITION FOLLOW UP  Intervention:   - Modify Boost supplement regimen to in-between meals to encourage PO intake of meals  - Consider addition of probiotic for gut health and integrity - Parenteral nutrition per pharmacy; continue per MD discretion.  Continue to encourage PO nutrition to promote bowel integrity.  - General healthful diet; encourage intake of foods and beverages as able.  - Continue with Megace 400 mg BID   Nutrition Dx:   Inadequate oral intake related to weakness/loose stools as evidenced by PO intake < 75% for one month, 5% body weight loss in one month.   Goal:  Pt to meet >/= 90% of their estimated nutrition needs   Monitor:  Total protein/energy intake, GI profile, labs, weight, glucose profile  Assessment:   [name] is a 80 y.o. female with prior h/o hypertension, diet controlled DM, recently admitted for cellulitis of the left leg and discharged on the 7/29, brought in by her daughter after she found her on the bathroom floor. As per the patient, she felt Weak, slightly went to the bathroom, and the next thing she knew she was on the floor. She also reports loose bowel Movements since 2 days, associated with nausea, no vomiting  Pt assessed by RD (8/5) who determine pt meet criteria for severe malnutrition.    Pt has had ongoing bouts of diarrhea- rectal tube remains in place.  She has been eating 25-65% of meals and supplementing with Boost twice daily.  She is keeping some foods as snacks, but her intake overall remains inadequate.   She has also started TPN.   Pt is ordered [place]. RD does not see associated diagnosis.   8/11: -Continues with decreased appetite. Pt consuming 25% of breakfast, and approximately 50% of dinner meals.  -Is receiving Boost QID. Pt reported consuming approximately 50% of each supplement -Pt started on Megace 40 mg on 8/11; consider increasing to 400 mg dosage for more therapeutic appetite stimulant effects -Encouraged PO  intake, offered additional snacks or supplements, which pt declined. Pt eager to begin moving and participating in PT/OT as she believes this will also help improve appetite -Receiving Clinimix E5/15 at 60 mlhr, with plan to advance to goal of 70 ml/hr. This would provide 1670 kcal (100% est kcal needs), 84 gram protein (100% est protein needs) -Flexiseal to be removed today 8/11 per MD notes -Phos/K/Mg WNL -CBGs < 150 mg/dl, one episode > 200 -Prealbumin low but trending up. Pre-Albumin is strongly affected by stress response and inflammatory process, therefore, do not expect to see an improvement in this lab value during acute hospitalization.  May 25, 2023: -Pt continues with deceased appetite. Consuming 10-25% PO intake. Noted that family has been encouraging pt to consume high protein foods- eggs, peanut butter.  -Has developed thrush, which also inhibits PO intake and decreases appetite. Receiving diflucan. Reviewed nutrition therapy for thrush to assist with tolerance -Medications reviewed; megace dosage has been increased to 400 mg BID suspension -Continues to consume Boost. Per discussion with RN, pt consumes 75% of at least 3 supplements daily. Would possibly benefit from modifying Boost to be available in-between meals to encouraged PO of solids/meal food items -RN reported pt's loose stool improving, was able to have soft, semi-formed stool today. Would benefit from probiotic for gut health -Continues with TPN. Receiving Clinimix E 5/15 at a goal rate of 24ml/hr + 20% fat emulsion at 73ml/hr provides 84g/day protein, 1672 Kcal/day.  Height: Ht Readings from Last 1 Encounters:  04/25/14 5'  6" (1.676 m)    Weight Status:   Wt Readings from Last 1 Encounters:  04/25/14 171 lb 8 oz (77.792 kg)    Re-estimated needs:  Kcal: 1550-1750  Protein: 75-85 gram  Fluid: >/=1600 ml/daily   Skin: non-pitting LE edema, otherwise intake  Diet Order: Carb Control   Intake/Output Summary (Last  24 hours) at 05/05/14 1207 Last data filed at 05/05/14 0935  Gross per 24 hour  Intake   3087 ml  Output   2100 ml  Net    987 ml    Last BM: 8/10   Labs:   Recent Labs Lab 05/02/14 0600 05/03/14 0500 05/04/14 0615 05/05/14 0500  NA 138 136* 137 137  K 3.9 4.3 4.4 4.8  CL 104 104 105 104  CO2 23 24 24 25   BUN 24* 26* 31* 37*  CREATININE 1.17* 1.18* 1.16* 1.33*  CALCIUM 9.0 9.2 9.4 9.9  MG 2.0 1.9  --  1.9  PHOS 3.5 2.9  --  3.7  GLUCOSE 122* 142* 123* 122*    CBG (last 3)   Recent Labs  05/04/14 1217 05/04/14 1621 05/05/14 0749  GLUCAP 178* 152* 126*    Scheduled Meds: . benzonatate  100 mg Oral BID  . cholecalciferol  1,000 Units Oral Daily  . fluconazole (DIFLUCAN) IV  100 mg Intravenous Q24H  . insulin aspart  0-9 Units Subcutaneous TID WC  . lactose free nutrition  237 mL Oral TID PC & HS  . megestrol  400 mg Oral BID  . metroNIDAZOLE  500 mg Oral 3 times per day  . nystatin  5 mL Oral QID  . pantoprazole  80 mg Oral Daily  . sodium chloride  10-40 mL Intracatheter Q12H  . spironolactone  50 mg Oral Daily    Continuous Infusions: . sodium chloride    . Marland KitchenTPN (CLINIMIX-E) Adult 70 mL/hr at 05/04/14 1723   And  . fat emulsion 250 mL (05/04/14 1723)  . Marland KitchenTPN (CLINIMIX-E) Adult     And  . fat emulsion     Atlee Abide MS RD LDN Clinical Dietitian [phone]

## 2014-05-05 NOTE — Progress Notes (Signed)
Patient with a small nosebleed this am from right nare. Stopped with patient applying pressure a few minutes.

## 2014-05-05 NOTE — Plan of Care (Signed)
Problem: Phase II Progression Outcomes Goal: Tolerating diet Outcome: Progressing Patient on TNA . Encouraging increased po intake.

## 2014-05-05 NOTE — Progress Notes (Signed)
Physical Therapy Treatment Patient Details Name: [name] MRN: [id number] DOB: 04-29-1928 Today's Date: 05/05/2014    History of Present Illness 80 yo female admitted with fall, diarrhea, LE cellulitis. Hx of HTN, cirrhosis. Pt lives alone in [place].     PT Comments    Pt able to perform stand pivot x 2 with RW, bed<>BSC. Performed LE exercises in supine. Pt unable to tolerate ambulation on today due to weakness, fatigue.   Follow Up Recommendations  SNF     Equipment Recommendations  None recommended by PT    Recommendations for Other Services OT consult     Precautions / Restrictions Precautions Precautions: Fall Precaution Comments: bowel frequency/incontinence Restrictions Weight Bearing Restrictions: No    Mobility  Bed Mobility Overal bed mobility: Needs Assistance Bed Mobility: Supine to Sit;Sit to Supine     Supine to sit: Min assist Sit to supine: Min assist   General bed mobility comments: Assist for trunk and LEs, and to manage lines/tube. Increased time.  Transfers Overall transfer level: Needs assistance Equipment used: Rolling walker (2 wheeled) Transfers: Sit to/from [facility] Sit to Stand: Min assist Stand pivot transfers: Min assist       General transfer comment: assist to power up and gain balance initially, to maneuver with RW.   Ambulation/Gait             General Gait Details: Pt unable to tolerate ambulation on today   Stairs            Wheelchair Mobility    Modified Rankin (Stroke Patients Only)       Balance                                    Cognition Arousal/Alertness: Awake/alert Behavior During Therapy: WFL for tasks assessed/performed Overall Cognitive Status: Within Functional Limits for tasks assessed                      Exercises General Exercises - Lower Extremity Ankle Circles/Pumps: AROM;Both;10 reps;Supine Quad Sets: AROM;Both;10  reps;Supine Heel Slides: AROM;Both;10 reps;Supine    General Comments        Pertinent Vitals/Pain Pain Assessment: 0-10 Pain Score:  (did not rate) Pain Location: rectal area Pain Intervention(s): Limited activity within patient's tolerance;Repositioned    Home Living                      Prior Function            PT Goals (current goals can now be found in the care plan section) Progress towards PT goals: Progressing toward goals (slowly)    Frequency  Min 3X/week    PT Plan Current plan remains appropriate    Co-evaluation             End of Session   Activity Tolerance: Patient limited by fatigue Patient left: in bed;with call bell/phone within reach     Time: 1410-1439 PT Time Calculation (min): 29 min  Charges:  $Therapeutic Exercise: 8-22 mins $Therapeutic Activity: 8-22 mins                    G Codes:      Weston Anna, MPT Pager: [phone]

## 2014-05-06 ENCOUNTER — Inpatient Hospital Stay (HOSPITAL_COMMUNITY): Payer: Medicare Other

## 2014-05-06 DIAGNOSIS — K759 Inflammatory liver disease, unspecified: Secondary | ICD-10-CM

## 2014-05-06 DIAGNOSIS — Z8505 Personal history of malignant neoplasm of liver: Secondary | ICD-10-CM

## 2014-05-06 LAB — GLUCOSE, CAPILLARY
GLUCOSE-CAPILLARY: 160 mg/dL — AB (ref 70–99)
Glucose-Capillary: 155 mg/dL — ABNORMAL HIGH (ref 70–99)
Glucose-Capillary: 165 mg/dL — ABNORMAL HIGH (ref 70–99)

## 2014-05-06 LAB — BASIC METABOLIC PANEL
Anion gap: 10 (ref 5–15)
BUN: 42 mg/dL — AB (ref 6–23)
CHLORIDE: 100 meq/L (ref 96–112)
CO2: 20 mEq/L (ref 19–32)
CREATININE: 1.26 mg/dL — AB (ref 0.50–1.10)
Calcium: 9.7 mg/dL (ref 8.4–10.5)
GFR calc Af Amer: 44 mL/min — ABNORMAL LOW (ref >90)
GFR, EST NON AFRICAN AMERICAN: 38 mL/min — AB (ref >90)
Glucose, Bld: 187 mg/dL — ABNORMAL HIGH (ref 70–99)
Potassium: 4.9 mEq/L (ref 3.7–5.3)
Sodium: 130 mEq/L — ABNORMAL LOW (ref 137–147)

## 2014-05-06 MED ORDER — ALTEPLASE 2 MG IJ SOLR
2.0000 mg | Freq: Once | INTRAMUSCULAR | Status: AC
  Administered 2014-05-06: 2 mg
  Filled 2014-05-06 (×2): qty 2

## 2014-05-06 MED ORDER — ALTEPLASE 2 MG IJ SOLR
2.0000 mg | Freq: Once | INTRAMUSCULAR | Status: AC
  Administered 2014-05-06: 2 mg
  Filled 2014-05-06: qty 2

## 2014-05-06 MED ORDER — FAT EMULSION 20 % IV EMUL
250.0000 mL | INTRAVENOUS | Status: AC
  Administered 2014-05-06: 250 mL via INTRAVENOUS
  Filled 2014-05-06: qty 250

## 2014-05-06 MED ORDER — TRACE MINERALS CR-CU-F-FE-I-MN-MO-SE-ZN IV SOLN
INTRAVENOUS | Status: AC
  Administered 2014-05-06: 19:00:00 via INTRAVENOUS
  Filled 2014-05-06: qty 2000

## 2014-05-06 NOTE — Progress Notes (Signed)
Pt unable to urinate at 2000 after attempting to go. Pt was bladder scanned and had 510 ml in her bladder. Walden Field NP notified an order received to In & Out cath pt. In & Out cath resulted in 571ml of amber urine. This RN to continue to monitor. Noreene Larsson RN, BSN

## 2014-05-06 NOTE — Progress Notes (Addendum)
Patient ID: [name], female   DOB: Jul 10, 1928, 80 y.o.   MRN: [id number] [facility] PROGRESS NOTE  [name] HMC:[id number] DOB: 19-Oct-1927 DOA: 04/25/2014 PCP: Alesia Richards, MD  Brief narrative: 80 y.o. female with past medical history of hypertension, prior history of MI, TIA, hepatoma who presented to [facility] ED 04/25/2014 status post fall at home. Pt also reported nausea and vomiting and having loose bowel movements. On admission, x-ray studies were done of the lumbar spine and pelvis but did not reveal acute fractures. CT head and CT cervical spine did not show acute intracranial findings. Initial chest x-ray showed no acute cardiopulmonary process. Blood work revealed mild hyponatremia, sodium 136. Creatinine was 1.45 and calcium 10.6. Urinalysis showed only trace leukocytes. Hospital course has been complicated due to findings of C.diff on stool PCR for which reason she was started on flagyl. She required flexiseal due to ongoing diarrhea. Flexiseal was removed 05/03/2014. In addition, she continues to have pleural effusion accumulation on the right side which based on cytology results is negative for malignancy. Patient is status post total of 3 ultrasound guided thoracentesis since the admission. Her abdomen is softly distended and 2 abdominal US showed not significant amount of ascites to be drained.  Pt requires TNA for nutritional support. Plan is to transitioned her off TNA and hopefully to [facility] SNF per family preference.   Assessment/Plan:   Principal Problem:  Acute respiratory failure with hypoxia / Right pleural effusion  Please note that the initial CXR did not show acute cardiopulmonary findings. We obtained CT chest 04/28/2014 for evaluation of abdominal distention but her respiratory status was stable. On 04/29/2014, pt felt more short of breath at which time we obtained CXR which showed large right pleural effusion and then pt underwent US guided thoracentesis with 1.2 L  fluid removed. Cytology results were negative for malignancy. Fluid culture showed no growth. Considering her very severe malnutrition most likely re accumulation of right pleural effusion is from malnutrition. She again had US guided thoracentesis on 05/02/2014 with 1.1 L fluid removed. Repeat CXR 05/03/2014 showed improvement in pleural effusion but there was now bilateral pleural effusion concerning for interstitial edema. Pt has received bolus lasix 40 mg IV 05/04/2014. She underwent 3 rd US guided thoracentesis with 900 cc fluid drained. Her imagining studies continue to show large pleural effusion but clinically her breathing is better. Will give next 24 hours and probably attempt thoracentesis in am.  Of note, patient was given one dose of Lasix on 04/29/2014, 20 mg IV  We will continue Tessalon capsules for cough twice daily. Continue bronchodilator treatments, Xopenex and Atrovent as needed for shortness of breath or wheezing. Active Problems:  Abdominal ascites  Seen on CT abdomen / pelvis. Paracentesis attempted 2 times and per IR not significant amount of ascites see. Abd US done 05/02/2014 sis not show significant amount of ascites. Per oncology will attempt another paracentesis today. Fall, weakness  Likely due to dehydration. Per PT evaluation, pt requires SNFon discharge and she and her family are agreeable to SNF placement.  Oral thrush  Start fluconazole and nystatin swish and swallow 05/04/2014. Oral thrush is improving. Hypertension  Blood pressure at goal. All antihypertensives on hold except for spironolactone. C.diff enteritis /Abdominal distention  Started flagyl 04/26/2014; due to ongoing diarrhea flexiseal was inserted 1 day after the admission. Discontinued Flexiseal 05/03/2014 Acute renal failure  Likely secondary to dehydration  Creatinine ranges from 1.4 to 1.16. Slight worsening in creatinine  Hypokalemia  Likely due to GI losses and lasix.  Repleted and WNL. Hypercalcemia   Likely due to dehydration. Resolved with IV fluids. Severe protein calorie malnutrition  Nutrition consulted  Severely low prealbumin.  Continue TNA for now. Continue megestrol 400 mg PO BID. DVT Prophylaxis  SCD's due to initial mild thrombocytopenia. Platelet count normalized as of  05/05/2014.   Code Status: Full.  Family Communication: plan of care discussed with the patient and her family at the bedside  Disposition Plan: SNF on discharge; hopefully we can wean off of TNA    IV Access:   Peripheral IV  PICC line placed 04/30/2014 Procedures and diagnostic studies:   Dg Chest 2 View 04/25/2014 IMPRESSION: No acute cardiopulmonary process. Cardiomegaly with mild central vascular congestion reidentified.  Dg Lumbar Spine Complete 04/25/2014 IMPRESSION: No acute osseous abnormality of the lumbar spine  Dg Pelvis 1-2 Views8/11/2013 IMPRESSION: Negative.  Ct Head Wo Contrast 04/25/2014 IMPRESSION: CT head: No acute intracranial process. Normal noncontrast CT of the head for age. CT cervical spine: Straightened cervical lordosis without acute fracture. Grade 1 C5-6 anterolisthesis on degenerative basis.  Ct Cervical Spine Wo Contrast 04/25/2014 IMPRESSION: CT head: No acute intracranial process. Normal noncontrast CT of the head for age. CT cervical spine: Straightened cervical lordosis without acute fracture. Grade 1 C5-6 anterolisthesis on degenerative basis.  Dg Chest [facility] 04/25/2014 IMPRESSION: Retrocardiac opacity which could represent atelectasis given the degree of hypoaeration although pneumonia could appear similar. If symptoms persist, consider PA and lateral chest radiographs obtained at full inspiration when the patient is clinically able.  CT abdomen 04/28/2014 Morphologic features of the liver compatible with cirrhosis with stigmata of portal venous hypertension. 2. Increase in abdominal ascites. 3. Hepatoma site within the segment 6 of the liver is slightly increased in size from previous  exam. 4. Large right pleural effusion. 5. Large hiatal hernia 6. Atherosclerotic disease 7. Prominent retroperitoneal adenopathy is nonspecific in the setting of cirrhosis and portal venous hypertension. This appears similar to previous exam. 8. Wall thickening of the colon is also nonspecific in the setting of portal venous hypertension. This may be due to a hypoproteinemic state or colitis.  Dg Chest 04/29/2014 Marked improvement RIGHT pleural effusion. No pneumothorax.  Dg Chest [facility] 04/29/2014 Significant increase in a right-sided pleural effusion when compared with the prior exam. Hiatal hernia.  US Thoracentesis Asp Pleural Space W/img Guide 04/29/2014 Successful ultrasound guided diagnostic and therapeutic right thoracentesis yielding 1.2 liters of pleural fluid.  TPN 04/30/2014  Dg Chest Port 05/01/2014 1.Interval placement of right upper extremity PICC with tip terminating in the proximal superior vena cava. 2. Interval accumulation of a moderate to large right-sided pleural effusion. There is also a small left pleural effusion. 3. Bibasilar atelectasis and/or consolidation.  US Thoracentesis Asp Pleural Space W/img Guide 05/02/2014 Successful ultrasound guided diagnostic and therapeutic right thoracentesis yielding 1.1 liters of pleural fluid.  Dg Chest 05/02/2014 Resolution of right pleural effusion, without pneumothorax. Bibasilar airspace disease which could represent atelectasis or infection.  US Abdomen 05/02/2014 No significant amount of ascites to perform a therapeutic paracentesis.  DG abdomen 05/03/2014 - no bowel obstruction, small bilateral pleural effusion, on right seems to be resolving; possible interstitial edema  US Thoracentesis Pleural Space 05/04/2014 Successful ultrasound guided therapeutic right thoracentesis yielding 900 cc's of pleural fluid.  CT chest w/o contrast 05/05/2014 - moderate right pleural effusion, no frank interstitial edema; cirrhosis DG chest 05/05/2014 - pleural effusions  and basila atelectasis larger on left. Hiatal hernia. Medical  Consultants:   IR for thoracentesi  Oncology (Dr. Burney Gauze) Other Consultants:   Nutrition  Physical therapy  Anti-Infectives:   Flagyl 04/26/2014 -->  Fluconazole 05/04/2014 -->   Leisa Lenz, MD  [facility] Pager [phone]  If 7PM-7AM, please contact night-coverage [email or web address] Password [email or web address] 05/06/2014, 10:01 AM   LOS: 11 days    HPI/Subjective: No acute overnight events.  Objective: Filed Vitals:   05/05/14 1446 05/05/14 2000 05/05/14 2109 05/06/14 0434  BP: 128/57  135/61 130/70  Pulse: 97 90 102 89  Temp: 97.6 F (36.4 C)  98.1 F (36.7 C) 98.2 F (36.8 C)  TempSrc: Oral  Oral Oral  Resp: 20  20 16   Height:      Weight:      SpO2: 94%  94% 93%    Intake/Output Summary (Last 24 hours) at 05/06/14 1001 Last data filed at 05/06/14 0620  Gross per 24 hour  Intake 2269.91 ml  Output    750 ml  Net 1519.91 ml    Exam:   General:  Pt is alert, follows commands appropriately, not in acute distress; oral thrush better   Cardiovascular: Regular rate and rhythm, S1/S2, no murmurs  Respiratory: diminished breath sounds right more than left; no wheezing  Abdomen: Softly distended, bowel sounds present  Extremities: No edema, pulses DP and PT palpable bilaterally  Neuro: Grossly nonfocal  Data Reviewed: Basic Metabolic Panel:  Recent Labs Lab 05/01/14 0539 05/02/14 0600 05/03/14 0500 05/04/14 0615 05/05/14 0500 05/06/14 0800  NA 138 138 136* 137 137 130*  K 3.4* 3.9 4.3 4.4 4.8 4.9  CL 107 104 104 105 104 100  CO2 22 23 24 24 25 20   GLUCOSE 124* 122* 142* 123* 122* 187*  BUN 25* 24* 26* 31* 37* 42*  CREATININE 1.18* 1.17* 1.18* 1.16* 1.33* 1.26*  CALCIUM 8.6 9.0 9.2 9.4 9.9 9.7  MG 2.0 2.0 1.9  --  1.9  --   PHOS 2.5 3.5 2.9  --  3.7  --    Liver Function Tests:  Recent Labs Lab 05/01/14 0539 05/02/14 0600 05/05/14 0500  AST 36 36 35  ALT 26 24 19   ALKPHOS  111 119* 123*  BILITOT 0.6 0.5 0.6  PROT 5.7* 6.2 6.3  ALBUMIN 2.3* 2.4* 2.2*   No results found for this basename: LIPASE, AMYLASE,  in the last 168 hours No results found for this basename: AMMONIA,  in the last 168 hours CBC:  Recent Labs Lab 04/30/14 0956 05/01/14 0539 05/02/14 0600 05/05/14 0500  WBC 7.3 7.6 9.2 8.8  NEUTROABS  --  4.2 5.1  --   HGB 12.7 11.9* 12.6 12.5  HCT 37.0 34.8* 36.9 36.7  MCV 91.8 92.8 93.2 93.1  PLT 133* 134* 134* 155   Cardiac Enzymes: No results found for this basename: CKTOTAL, CKMB, CKMBINDEX, TROPONINI,  in the last 168 hours BNP: No components found with this basename: POCBNP,  CBG:  Recent Labs Lab 05/04/14 1621 05/05/14 0749 05/05/14 1223 05/05/14 1701 05/06/14 0729  GLUCAP 152* 126* 165* 115* 155*    Recent Results (from the past 240 hour(s))  CLOSTRIDIUM DIFFICILE BY PCR     Status: Abnormal   Collection Time    04/26/14  5:19 PM      Result Value Ref Range Status   C difficile by pcr POSITIVE (*) NEGATIVE Final   Comment: CRITICAL RESULT CALLED TO, READ BACK BY AND VERIFIED WITH:     PERIKA K RN 04/27/14 0734 COSTELLO  B     Performed at [facility]  BODY FLUID CULTURE     Status: None   Collection Time    04/29/14  2:33 PM      Result Value Ref Range Status   Specimen Description FLUID RIGHT PLEURAL EFFUSION   Final   Special Requests Normal   Final   Gram Stain     Final   Value: NO WBC SEEN     NO ORGANISMS SEEN     Performed at [facility]   Culture     Final   Value: NO GROWTH 3 DAYS     Performed at [facility]   Report Status 05/02/2014 FINAL   Final  URINE CULTURE     Status: None   Collection Time    05/03/14 11:50 AM      Result Value Ref Range Status   Specimen Description URINE, CATHETERIZED   Final   Special Requests flagyl Immunocompromised   Final   Culture  Setup Time     Final   Value: 05/03/2014 14:36     Performed at [place]     Final    Value: NO GROWTH     Performed at [facility]   Culture     Final   Value: NO GROWTH     Performed at [facility]   Report Status 05/04/2014 FINAL   Final     Scheduled Meds: . fluconazole (DIFLUCAN)   100 mg Intravenous Q24H  . insulin aspart  0-9 Units Subcutaneous TID WC  . megestrol  400 mg Oral BID  . metroNIDAZOLE  500 mg Oral 3 times per day  . nystatin  5 mL Oral QID  . pantoprazole  80 mg Oral Daily  . spironolactone  50 mg Oral Daily   Continuous Infusions: . sodium chloride Stopped (05/05/14 2000)  . Marland KitchenTPN (CLINIMIX-E) Adult 70 mL/hr at 05/05/14 1708   And  . fat emulsion 250 mL (05/05/14 1708)

## 2014-05-06 NOTE — Progress Notes (Signed)
Mrs. Beauchesne is slowly getting better. She is eating a little better. She continues on her TNA.  She had her CT of the chest yesterday. There is nothing in the lungs,, that looked suspicious. She still had a moderate right effusion. There is no lymphadenopathy. She had been cirrhotic liver. There is some ascites in the upper abdomen. Of her abdomen does little bit more distended. We will go ahead and get a paracentesis today.  She's not having diarrhea. I would like to think that the zoster infection is better.  She is doing a little physical therapy.  She's not had any fever.  Her urine culture is negative. I will try to get her Foley catheter out.  Her vital signs look stable. Blood pressure 130/70. She is afebrile. Lungs show some slight decrease over on the right side. She has no wheezes. Cardiac exam regular in rhythm. Abdomen is slightly distended. There might be a fluid wave. Extremities shows some muscle atrophy in upper and lower extremities. She is decent range of motion of her joints. Skin exam shows scattered ecchymoses. Extremities shows the PICC line in the right upper arm. Neurological exam is non-focal.  There are no labs for today.  Hopefully, she will continue to increase her oral intake so that the TNA can be discontinued. It may help to get a calorie count to see that she is eating enough to sustain herself and be able to discontinue the TNA.  We will try a paracentesis today.  [name] 5:22

## 2014-05-06 NOTE — Progress Notes (Signed)
Calorie Count Note  Intervention:  - Continue Boost Plus TID - Ordered snacks per family's list of pt's favorite snacks - RD to monitor and analyze calorie count on Monday 8/17  48 hour calorie count ordered.  Diet: CHO modified  Supplements: Boost Plus TID  8/14: Breakfast: 70 calories, 6g protein Lunch: 50 calories, 6g protein Dinner: Not yet ordered  Supplements: 360 calories, 14g protein  Total intake: 480 kcal (31% of minimum estimated needs)  26g protein (35% of minimum estimated needs)  Nutrition Dx: Inadequate oral intake related to weakness/loose stools as evidenced by PO intake < 75% for one month, 5% body weight loss in one month - ongoing   Goal: Pt to meet >/= 90% of their estimated nutrition needs - met with TPN  TPN: Receiving Clinimix E 5/15 at a goal rate of 18m/hr + 20% fat emulsion at 165mhr provides 84g/day protein, 1672 Kcal/day - meeting 100% of estimated nutritional needs  Son reports PO intake poor today due to pt c/o feeling full. Unable to get paracentesis r/t no significant ascites per radiology notes. Provided RD with list of snacks pt enjoys - will order.    HeCarlis StableS, RDNeedvilleLDN [phone] [phone]/After Hours Pager

## 2014-05-06 NOTE — Progress Notes (Signed)
Patient ID: [name], female   DOB: [birth year], 80 y.o.   MRN: [id number] Pt presented to [place] today for paracentesis. On limited US abd in all four quadrants there is no sig ascites noted. Procedure was cancelled. Pt/son aware.

## 2014-05-06 NOTE — Progress Notes (Signed)
PARENTERAL NUTRITION CONSULT NOTE - Follow up  Pharmacy Consult for TPN Indication: severe malnutrition in setting of hepatocellular carcinoma, chronic active hepatitis, and C.diff - associated diarrhea  Allergies  Allergen Reactions  . Avelox [Moxifloxacin Hcl In Nacl] Anaphylaxis  . Statins Other (See Comments)    Liver function elevations  . Sulfa Antibiotics Hives and Itching  . Ace Inhibitors     Unknown  . Doxycycline     Unknown  . Infed [Iron Dextran] Other (See Comments)    IV IRON, Unknown  . Infergen [Interferon Alfacon-1]   . Vancomycin Other (See Comments)    Flushing, erythema to neck and face, tolerated vanc at lower infusion rate  . Ciprofloxacin Other (See Comments)    Complains of sore mouth  . Fenofibrate Other (See Comments)    constipation   Patient Measurements: Height: _0  (167.6 cm) Weight: 171 lb 8 oz (77.792 kg) IBW/kg (Calculated) : 59.3 No updated weight since 8/3  Vital Signs: Temp: 98.2 F (36.8 C) (08/14 0434) Temp src: Oral (08/14 0434) BP: 130/70 mmHg (08/14 0434) Pulse Rate: 89 (08/14 0434) Intake/Output from previous day: 08/13 0701 - 08/14 0700 In: 2389.9 [P.O.:350; I.V.:66; IV Piggyback:77.9; JWJ:1914] Out: 900 [Urine:900] 2 stools recorded since yesterday 0700  Intake/Output from this shift:    Labs:  Recent Labs  05/05/14 0500  WBC 8.8  HGB 12.5  HCT 36.7  PLT 155    Recent Labs  05/04/14 0615 05/05/14 0500 05/06/14 0800  NA 137 137 130*  K 4.4 4.8 4.9  CL 105 104 100  CO2 _1 GLUCOSE 123* 122* 187*  BUN 31* 37* 42*  CREATININE 1.16* 1.33* 1.26*  CALCIUM 9.4 9.9 9.7  MG  --  1.9  --   PHOS  --  3.7  --   PROT  --  6.3  --   ALBUMIN  --  2.2*  --   AST  --  35  --   ALT  --  19  --   ALKPHOS  --  123*  --   BILITOT  --  0.6  --   Corrected calcium: 11.1 Estimated Creatinine Clearance: 35.6 ml/min (by C-G formula based on Cr of 1.26).    Recent Labs  05/05/14 1223 05/05/14 1701  05/06/14 0729  GLUCAP 165* 115* 155*    Medications:  Scheduled:  . alteplase  2 mg Intracatheter Once  . alteplase  2 mg Intracatheter Once  . benzonatate  100 mg Oral BID  . cholecalciferol  1,000 Units Oral Daily  . fluconazole (DIFLUCAN) IV  100 mg Intravenous Q24H  . insulin aspart  0-9 Units Subcutaneous TID WC  . lactose free nutrition  237 mL Oral TID PC & HS  . megestrol  400 mg Oral BID  . metroNIDAZOLE  500 mg Oral 3 times per day  . nystatin  5 mL Oral QID  . pantoprazole  80 mg Oral Daily  . sodium chloride  10-40 mL Intracatheter Q12H  . spironolactone  50 mg Oral Daily   Infusions:  . sodium chloride Stopped (05/05/14 2000)  . Marland KitchenTPN (CLINIMIX-E) Adult 70 mL/hr at 05/05/14 1708   And  . fat emulsion 250 mL (05/05/14 1708)    Assessment: 29 yoF with hepatocellular carcinoma and chronic active hepatitis admitted 8/3 after LOC at home associated with recent nausea and loose stools. Was previously admitted 7/26 for cellulitis; received Rocephin x 1 dose, vancomycin x 3 doses, discharged on po  Keflex. Stool tested positive for C.diff 8/4. Poor po intake PTA, recent 10 lb weight loss noted. Orders were received from oncology to begin TPN 8/8 for malnutrition.  Pharmacy dosing assistance was requested.   Nutritional Goals:  RD recs 8/5: 75-85 grams of protein and 1550-1750 kCal per day. Clinimix E 5/15 at a goal rate of 75m/hr + 20% fat emulsion at 140mhr provides 84g/day protein, 1672 Kcal/day.   Significant events: 8/11: TPN advanced to goal. Flexiseal dc'd. Megace started for appetite stimulation. 8/12: Diflucan started for thrush.  8/12: R thoracentesis for drainage of pleural effusion 8/13: Planning paracentesis today    8/14: D#7 TPN TPN Access:  PICC (placed 8/8)   Current Nutrition:  Clinimix E 5/15 at 7041mr + 20% fat emulsion at 18m86m Carb modified diet Boost Plus q.i.d.  IVF: NS at 10 mL/hr only when administering IV medications  Insulin  Requirements in the past 24 hours:  6 units sliding scale Novolog; on sensitive-scale SSI TID   Labs:  Electrolytes - Na slightly low; unable to adjust Na content of premixed Clinimix-E. Other lytes WNL.  Renal: slight improvement in SCr; BUN still rising; estimated CrCl ~36 mL/min  Hepatic (last checked 8/13): Alk Phos up slightly but < 1.5 x ULN.  Other LFTs below ULN.  CBGs - up slightly; last two values >150 but remaining <200.  TGs - WNL, last checked 8/10  Prealbumin - 6.1 (8/10)  Dietary intake remains poor according to charting. Stools are less frequent and reportedly are formed now.   Plans noted for possible paracentesis today.   Plan:   Continue Clinimix E 5/15 at 70ml6m(goal rate).  Continue 20% fat emulsion at 18ml/41m TPN to contain standard multivitamins and trace elements.  Cont carbohydrate-modified diet.  Follow CBGs - if consistently remaining >150 consider increasing SSI coverage .   [facility] ; f/u result.  Follow tolerance of diet.  Routine TNA lab panel on Mondays & Thursdays.  Randy Clayburn PertmD, BCPS Pager: [phone]  10:26 AM

## 2014-05-06 NOTE — Progress Notes (Signed)
CSW continuing to follow for disposition planning.  Per MD, pt not yet medically ready for discharge as pt continuing to wean off of TNA.   CSW updated [facility] and facility confirmed that facility will have bed available when pt medically ready for discharge.  CSW to continue to follow.  Alison Murray, MSW, [place] [facility] [phone]

## 2014-05-07 ENCOUNTER — Inpatient Hospital Stay (HOSPITAL_COMMUNITY): Payer: Medicare Other

## 2014-05-07 LAB — CBC
HCT: 37.9 % (ref 36.0–46.0)
HEMOGLOBIN: 12.7 g/dL (ref 12.0–15.0)
MCH: 31.4 pg (ref 26.0–34.0)
MCHC: 33.5 g/dL (ref 30.0–36.0)
MCV: 93.8 fL (ref 78.0–100.0)
PLATELETS: 163 10*3/uL (ref 150–400)
RBC: 4.04 MIL/uL (ref 3.87–5.11)
RDW: 14.4 % (ref 11.5–15.5)
WBC: 9.4 10*3/uL (ref 4.0–10.5)

## 2014-05-07 LAB — COMPREHENSIVE METABOLIC PANEL
ALK PHOS: 140 U/L — AB (ref 39–117)
ALT: 26 U/L (ref 0–35)
AST: 57 U/L — AB (ref 0–37)
Albumin: 2.3 g/dL — ABNORMAL LOW (ref 3.5–5.2)
Anion gap: 9 (ref 5–15)
BUN: 43 mg/dL — ABNORMAL HIGH (ref 6–23)
CHLORIDE: 99 meq/L (ref 96–112)
CO2: 22 meq/L (ref 19–32)
Calcium: 9.9 mg/dL (ref 8.4–10.5)
Creatinine, Ser: 1.28 mg/dL — ABNORMAL HIGH (ref 0.50–1.10)
GFR, EST AFRICAN AMERICAN: 44 mL/min — AB (ref >90)
GFR, EST NON AFRICAN AMERICAN: 38 mL/min — AB (ref >90)
Glucose, Bld: 123 mg/dL — ABNORMAL HIGH (ref 70–99)
Potassium: 5.2 mEq/L (ref 3.7–5.3)
SODIUM: 130 meq/L — AB (ref 137–147)
Total Bilirubin: 0.8 mg/dL (ref 0.3–1.2)
Total Protein: 6.8 g/dL (ref 6.0–8.3)

## 2014-05-07 LAB — GLUCOSE, CAPILLARY
Glucose-Capillary: 124 mg/dL — ABNORMAL HIGH (ref 70–99)
Glucose-Capillary: 116 mg/dL — ABNORMAL HIGH (ref 70–99)
Glucose-Capillary: 160 mg/dL — ABNORMAL HIGH (ref 70–99)

## 2014-05-07 MED ORDER — SACCHAROMYCES BOULARDII 250 MG PO CAPS
250.0000 mg | ORAL_CAPSULE | Freq: Two times a day (BID) | ORAL | Status: DC
  Administered 2014-05-07 – 2014-05-11 (×7): 250 mg via ORAL
  Filled 2014-05-07 (×10): qty 1

## 2014-05-07 MED ORDER — FAT EMULSION 20 % IV EMUL
250.0000 mL | INTRAVENOUS | Status: AC
  Administered 2014-05-07: 250 mL via INTRAVENOUS
  Filled 2014-05-07: qty 250

## 2014-05-07 MED ORDER — TRACE MINERALS CR-CU-F-FE-I-MN-MO-SE-ZN IV SOLN
INTRAVENOUS | Status: AC
  Administered 2014-05-07: 17:00:00 via INTRAVENOUS
  Filled 2014-05-07: qty 2000

## 2014-05-07 NOTE — Progress Notes (Signed)
Pt attempted to void x 2 at 0300, only able to void 25 ml. Bladder scanned again, with 516 ml result. Walden Field NP paged and order received to insert foley. Foley inserted, over 500 ml of yellow urine with sediment resulted. Noreene Larsson RN, BSN

## 2014-05-07 NOTE — Progress Notes (Signed)
OT Cancellation Note  Patient Details Name: [name] MRN: [id number] DOB: 1928-01-05   Cancelled Treatment:    Reason Eval/Treat Not Completed: Patient declined, reports "coughing all morning" and "I don't think I'm able to right now."  Addalynn Kumari A 05/07/2014, 1:02 PM

## 2014-05-07 NOTE — Procedures (Signed)
Successful US guided right thoracentesis. Yielded 1.1L of amber colored pleural fluid. Pt tolerated procedure well. No immediate complications.  Specimen was not sent for labs. CXR ordered.  Ascencion Dike PA-C 05/07/2014 3:11 PM

## 2014-05-07 NOTE — Progress Notes (Signed)
Patient ID: [name], female   DOB: [birth year], 80 y.o.   MRN: [id number] [facility] PROGRESS NOTE  [name] RWE:[id number] DOB: Jun 04, 1928 DOA: 04/25/2014 PCP: Nancy Richards, MD  Brief narrative: 80 y.o. female with past medical history of hypertension, prior history of MI, TIA, hepatoma who presented to [facility] ED 04/25/2014 status post fall at home. Pt also reported nausea and vomiting and having loose bowel movements. On admission, x-ray studies were done of the lumbar spine and pelvis but did not reveal acute fractures. CT head and CT cervical spine did not show acute intracranial findings. Initial chest x-ray showed no acute cardiopulmonary process. Blood work revealed mild hyponatremia, sodium 136. Creatinine was 1.45 and calcium 10.6. Urinalysis showed only trace leukocytes.  Hospital course has been complicated due to findings of C.diff on stool PCR for which reason she was started on flagyl. She required flexiseal due to ongoing diarrhea. Flexiseal was removed 05/03/2014. In addition, she continues to have pleural effusion accumulation on the right side which based on cytology results is negative for malignancy. Patient is status post total of 3 ultrasound guided thoracentesis since the admission. Her abdomen continues to be distended but based on abdominal US there is not enough ascites for drainage. Pt requires TNA for nutritional support. Plan is to wean her off TNA and hopefully discharge her to [facility] per family preference.   Assessment/Plan:    Principal Problem:  Acute respiratory failure with hypoxia / Persistent right pleural effusion  Please note that the initial CXR did not show acute cardiopulmonary findings. We obtained CT chest 04/28/2014 for evaluation of abdominal distention but her respiratory status was stable. On 04/29/2014, pt felt more short of breath at which time we obtained CXR which showed large right pleural effusion. Pt received small bolus of lasix 20  mg IV and then underwent US guided thoracentesis with 1.2 L fluid removed. Cytology results were negative for malignancy. Fluid culture showed no growth. Considering her very severe malnutrition most likely re accumulation of right pleural effusion is from malnutrition. Pt again had US guided thoracentesis on 05/02/2014 with 1.1 L fluid removed. Repeat CXR 05/03/2014 showed improvement in pleural effusion but there was now bilateral pleural effusion concerning for interstitial edema. Pt has then received bolus lasix 40 mg IV 05/04/2014. Third US guided thoracentesis was done 05/04/2014 with 900 cc fluid removed. Imaging studies continue to show re accumulation of right pleural effusion based on last CXR done 05/07/2014. Order placed for another thoracentesis and possibly pleur-X catheter placement. We will continue Tessalon capsules for cough twice daily. Continue bronchodilator treatments, Xopenex and Atrovent as needed for shortness of breath or wheezing. Active Problems:  Abdominal ascites  Seen on CT abdomen / pelvis. Paracentesis attempted 3 times and per IR not significant amount of ascites seen. (? If reaccumulating pleural effusion compressing on abdomen).  Fall, generalized weakness  Likely due to dehydration. Per PT evaluation, pt requires SNFon discharge and she and her family are agreeable to SNF placement.  Oral thrush  Start fluconazole and nystatin swish and swallow 05/04/2014. Oral thrush is improving. Hypertension  Blood pressure at goal. All antihypertensives on hold except for spironolactone. C.diff enteritis /Abdominal distention  Started flagyl 04/26/2014; due to ongoing diarrhea flexiseal was inserted 1 day after the admission. Discontinued Flexiseal 05/03/2014. No further reports of diarrhea. Acute renal failure  Likely secondary to dehydration  Creatinine ranges from 1.4 to 1.16. Creatinine trended up from 1.16 to 1.33 which could reflect lasix  given for possible interstitial  edema. Hypokalemia  Likely due to GI losses and lasix.  Repleted and WNL. Hypercalcemia  Likely due to dehydration. Resolved with IV fluids. Severe protein calorie malnutrition  Nutrition consulted  Severely low prealbumin.  Continue TNA for now. Continue megestrol 400 mg PO BID. DVT Prophylaxis  SCD's due to initial mild thrombocytopenia. Platelet count normalized as of 05/05/2014.   Code Status: Full.  Family Communication: plan of care discussed with the patient and her family at the bedside  Disposition Plan: SNF on discharge; hopefully we can wean off of TNA   IV Access:   Peripheral IV  PICC line placed 04/30/2014  Procedures and diagnostic studies:   Dg Chest 2 View 04/25/2014 IMPRESSION: No acute cardiopulmonary process. Cardiomegaly with mild central vascular congestion reidentified.  Dg Lumbar Spine Complete 04/25/2014 IMPRESSION: No acute osseous abnormality of the lumbar spine  Dg Pelvis 1-2 Views8/11/2013 IMPRESSION: Negative.  Ct Head Wo Contrast 04/25/2014 IMPRESSION: CT head: No acute intracranial process. Normal noncontrast CT of the head for age. CT cervical spine: Straightened cervical lordosis without acute fracture. Grade 1 C5-6 anterolisthesis on degenerative basis.  Ct Cervical Spine Wo Contrast 04/25/2014 IMPRESSION: CT head: No acute intracranial process. Normal noncontrast CT of the head for age. CT cervical spine: Straightened cervical lordosis without acute fracture. Grade 1 C5-6 anterolisthesis on degenerative basis.  Dg Chest [facility] 04/25/2014 IMPRESSION: Retrocardiac opacity which could represent atelectasis given the degree of hypoaeration although pneumonia could appear similar. If symptoms persist, consider PA and lateral chest radiographs obtained at full inspiration when the patient is clinically able.  CT abdomen 04/28/2014 Morphologic features of the liver compatible with cirrhosis with stigmata of portal venous hypertension. 2. Increase in abdominal ascites. 3.  Hepatoma site within the segment 6 of the liver is slightly increased in size from previous exam. 4. Large right pleural effusion. 5. Large hiatal hernia 6. Atherosclerotic disease 7. Prominent retroperitoneal adenopathy is nonspecific in the setting of cirrhosis and portal venous hypertension. This appears similar to previous exam. 8. Wall thickening of the colon is also nonspecific in the setting of portal venous hypertension. This may be due to a hypoproteinemic state or colitis.  Dg Chest 04/29/2014 Marked improvement RIGHT pleural effusion. No pneumothorax.  Dg Chest [facility] 04/29/2014 Significant increase in a right-sided pleural effusion when compared with the prior exam. Hiatal hernia.  US Thoracentesis Asp Pleural Space W/img Guide 04/29/2014 Successful ultrasound guided diagnostic and therapeutic right thoracentesis yielding 1.2 liters of pleural fluid.  TPN 04/30/2014  Dg Chest Port 05/01/2014 1.Interval placement of right upper extremity PICC with tip terminating in the proximal superior vena cava. 2. Interval accumulation of a moderate to large right-sided pleural effusion. There is also a small left pleural effusion. 3. Bibasilar atelectasis and/or consolidation.  US Thoracentesis Asp Pleural Space W/img Guide 05/02/2014 Successful ultrasound guided diagnostic and therapeutic right thoracentesis yielding 1.1 liters of pleural fluid.  Dg Chest 05/02/2014 Resolution of right pleural effusion, without pneumothorax. Bibasilar airspace disease which could represent atelectasis or infection.  US Abdomen 05/02/2014 No significant amount of ascites to perform a therapeutic paracentesis.  DG abdomen 05/03/2014 - no bowel obstruction, small bilateral pleural effusion, on right seems to be resolving; possible interstitial edema  US Thoracentesis Pleural Space 05/04/2014 Successful ultrasound guided therapeutic right thoracentesis yielding 900 cc's of pleural fluid.  CT chest w/o contrast 05/05/2014 - moderate right  pleural effusion, no frank interstitial edema; cirrhosis  DG chest 05/05/2014 - pleural effusions and basila  atelectasis larger on left. Hiatal hernia. US Abdomen Limited  05/06/2014   No significant amount of ascites to perform paracentesis.   Dg Chest Port 1 View 05/07/2014   Worsening RIGHT pleural effusion.     Medical Consultants:   IR for thoracentesi  Oncology (Dr. Burney Gauze)  Other Consultants:   Nutrition  Physical therapy   Anti-Infectives:   Flagyl 04/26/2014 -->  Fluconazole 05/04/2014 -->   Leisa Lenz, MD  [facility] Pager [phone]  If 7PM-7AM, please contact night-coverage [email or web address] Password [email or web address] 05/07/2014, 10:20 AM   LOS: 12 days    HPI/Subjective: No acute overnight events.  Objective: Filed Vitals:   05/06/14 0434 05/06/14 1400 05/06/14 2115 05/07/14 0453  BP: 130/70 130/78 128/52 138/60  Pulse: 89 90 88 82  Temp: 98.2 F (36.8 C) 98 F (36.7 C) 97.8 F (36.6 C) 97.5 F (36.4 C)  TempSrc: Oral Oral Oral Oral  Resp: 16 20 20 20   Height:      Weight:      SpO2: 93% 94% 95% 94%    Intake/Output Summary (Last 24 hours) at 05/07/14 1020 Last data filed at 05/07/14 0616  Gross per 24 hour  Intake   1200 ml  Output   1325 ml  Net   -125 ml    Exam:   General:  Pt is alert, follows commands appropriately, not in acute distress, oral thrush improving   Cardiovascular: Regular rate and rhythm, S1/S2, no murmurs  Respiratory: diminished breath sounds R>>L; wheezing audible  Abdomen: Softly distended, bowel sounds present  Extremities: No edema, pulses DP and PT palpable bilaterally  Neuro: Grossly nonfocal  Data Reviewed: Basic Metabolic Panel:  Recent Labs Lab 05/01/14 0539 05/02/14 0600 05/03/14 0500 05/04/14 0615 05/05/14 0500 05/06/14 0800 05/07/14 0414  NA 138 138 136* 137 137 130* 130*  K 3.4* 3.9 4.3 4.4 4.8 4.9 5.2  CL 107 104 104 105 104 100 99  CO2 22 23 24 24 25 20 22   GLUCOSE 124* 122* 142* 123*  122* 187* 123*  BUN 25* 24* 26* 31* 37* 42* 43*  CREATININE 1.18* 1.17* 1.18* 1.16* 1.33* 1.26* 1.28*  CALCIUM 8.6 9.0 9.2 9.4 9.9 9.7 9.9  MG 2.0 2.0 1.9  --  1.9  --   --   PHOS 2.5 3.5 2.9  --  3.7  --   --    Liver Function Tests:  Recent Labs Lab 05/01/14 0539 05/02/14 0600 05/05/14 0500 05/07/14 0414  AST 36 36 35 57*  ALT 26 24 19 26   ALKPHOS 111 119* 123* 140*  BILITOT 0.6 0.5 0.6 0.8  PROT 5.7* 6.2 6.3 6.8  ALBUMIN 2.3* 2.4* 2.2* 2.3*   No results found for this basename: LIPASE, AMYLASE,  in the last 168 hours No results found for this basename: AMMONIA,  in the last 168 hours CBC:  Recent Labs Lab 05/01/14 0539 05/02/14 0600 05/05/14 0500 05/07/14 0414  WBC 7.6 9.2 8.8 9.4  NEUTROABS 4.2 5.1  --   --   HGB 11.9* 12.6 12.5 12.7  HCT 34.8* 36.9 36.7 37.9  MCV 92.8 93.2 93.1 93.8  PLT 134* 134* 155 163   Cardiac Enzymes: No results found for this basename: CKTOTAL, CKMB, CKMBINDEX, TROPONINI,  in the last 168 hours BNP: No components found with this basename: POCBNP,  CBG:  Recent Labs Lab 05/05/14 1701 05/06/14 0729 05/06/14 1139 05/06/14 1613 05/07/14 0720  GLUCAP 115* 155* 160* 165* 124*    BODY FLUID CULTURE  Status: None   Collection Time    04/29/14  2:33 PM      Result Value Ref Range Status   Specimen Description FLUID RIGHT PLEURAL EFFUSION   Final   Value: NO GROWTH 3 DAYS     Performed at [facility]   Report Status 05/02/2014 FINAL   Final  URINE CULTURE     Status: None   Collection Time    05/03/14 11:50 AM      Result Value Ref Range Status   Specimen Description URINE, CATHETERIZED   Final   Value: NO GROWTH     Performed at [facility]   Report Status 05/04/2014 FINAL   Final     Scheduled Meds: . benzonatate  100 mg Oral BID  . cholecalciferol  1,000 Units Oral Daily  . fluconazole (DIFLUCAN)   100 mg Intravenous Q24H  . insulin aspart  0-9 Units Subcutaneous TID WC  . lactose free nutrition   237 mL Oral TID PC & HS  . megestrol  400 mg Oral BID  . metroNIDAZOLE  500 mg Oral 3 times per day  . nystatin  5 mL Oral QID  . pantoprazole  80 mg Oral Daily  . saccharomyces boulardii  250 mg Oral BID  . spironolactone  50 mg Oral Daily   Continuous Infusions: . sodium chloride Stopped (05/05/14 2000)  . Marland KitchenTPN (CLINIMIX-E) Adult 70 mL/hr at 05/06/14 1912   And  . fat emulsion 250 mL (05/06/14 1912)  . Marland KitchenTPN (CLINIMIX-E) Adult     And  . fat emulsion

## 2014-05-07 NOTE — Progress Notes (Signed)
PARENTERAL NUTRITION CONSULT NOTE - Follow up  Pharmacy Consult for TPN Indication: severe malnutrition in setting of hepatocellular carcinoma, chronic active hepatitis, and C.diff - associated diarrhea  Allergies  Allergen Reactions  . Avelox [Moxifloxacin Hcl In Nacl] Anaphylaxis  . Statins Other (See Comments)    Liver function elevations  . Sulfa Antibiotics Hives and Itching  . Ace Inhibitors     Unknown  . Doxycycline     Unknown  . Infed [Iron Dextran] Other (See Comments)    IV IRON, Unknown  . Infergen [Interferon Alfacon-1]   . Vancomycin Other (See Comments)    Flushing, erythema to neck and face, tolerated vanc at lower infusion rate  . Ciprofloxacin Other (See Comments)    Complains of sore mouth  . Fenofibrate Other (See Comments)    constipation   Patient Measurements: Height: _0  (167.6 cm) Weight: 171 lb 8 oz (77.792 kg) IBW/kg (Calculated) : 59.3 No updated weight since 8/3  Vital Signs: Temp: 97.5 F (36.4 C) (08/15 0453) Temp src: Oral (08/15 0453) BP: 138/60 mmHg (08/15 0453) Pulse Rate: 82 (08/15 0453) Intake/Output from previous day: 08/14 0701 - 08/15 0700 In: 1300 [P.O.:340; I.V.:80; TPN:880] Out: 1325 [Urine:1325] 2 stools recorded since yesterday 0700  Intake/Output from this shift:    Labs:  Recent Labs  05/05/14 0500 05/07/14 0414  WBC 8.8 9.4  HGB 12.5 12.7  HCT 36.7 37.9  PLT 155 163    Recent Labs  05/05/14 0500 05/06/14 0800 05/07/14 0414  NA 137 130* 130*  K 4.8 4.9 5.2  CL 104 100 99  CO2 _1 GLUCOSE 122* 187* 123*  BUN 37* 42* 43*  CREATININE 1.33* 1.26* 1.28*  CALCIUM 9.9 9.7 9.9  MG 1.9  --   --   PHOS 3.7  --   --   PROT 6.3  --  6.8  ALBUMIN 2.2*  --  2.3*  AST 35  --  57*  ALT 19  --  26  ALKPHOS 123*  --  140*  BILITOT 0.6  --  0.8  Corrected calcium: 11.1 Estimated Creatinine Clearance: 35.1 ml/min (by C-G formula based on Cr of 1.28).    Recent Labs  05/06/14 1139 05/06/14 1613  05/07/14 0720  GLUCAP 160* 165* 124*    Medications:  Scheduled:  . benzonatate  100 mg Oral BID  . cholecalciferol  1,000 Units Oral Daily  . fluconazole (DIFLUCAN) IV  100 mg Intravenous Q24H  . insulin aspart  0-9 Units Subcutaneous TID WC  . lactose free nutrition  237 mL Oral TID PC & HS  . megestrol  400 mg Oral BID  . metroNIDAZOLE  500 mg Oral 3 times per day  . nystatin  5 mL Oral QID  . pantoprazole  80 mg Oral Daily  . sodium chloride  10-40 mL Intracatheter Q12H  . spironolactone  50 mg Oral Daily   Infusions:  . sodium chloride Stopped (05/05/14 2000)  . Marland KitchenTPN (CLINIMIX-E) Adult 70 mL/hr at 05/06/14 1912   And  . fat emulsion 250 mL (05/06/14 1912)    Assessment: 20 yoF with hepatocellular carcinoma and chronic active hepatitis admitted 8/3 after LOC at home associated with recent nausea and loose stools. Was previously admitted 7/26 for cellulitis; received Rocephin x 1 dose, vancomycin x 3 doses, discharged on po Keflex. Stool tested positive for C.diff 8/4. Poor po intake PTA, recent 10 lb weight loss noted. Orders were received from oncology to begin  TPN 8/8 for malnutrition.  Pharmacy dosing assistance was requested.   Nutritional Goals:  RD recs 8/5: 75-85 grams of protein and 1550-1750 kCal per day. Clinimix E 5/15 at a goal rate of 72m/hr + 20% fat emulsion at 167mhr provides 84g/day protein, 1672 Kcal/day.   Significant events: 8/11: TPN advanced to goal. Flexiseal dc'd. Megace started for appetite stimulation. 8/12: Diflucan started for thrush.  8/12: R thoracentesis for drainage of pleural effusion 8/13: Planning paracentesis today 8/14: Paracentesis canceled   8/15: D#8 TPN TPN Access:  PICC (placed 8/8)   Current Nutrition:  Clinimix E 5/15 at 7069mr + 20% fat emulsion at 40m48m Carb modified diet Boost Plus t.i.d.  IVF: NS at 10 mL/hr only when administering IV medications  Insulin Requirements in the past 24 hours:  1 units  sliding scale Novolog; on sensitive-scale SSI TID   Labs:  Electrolytes - Na slightly low; unable to adjust Na content of premixed Clinimix-E. Other lytes WNL.  Renal: slight improvement in SCr; BUN still rising; estimated CrCl ~35 mL/min  Hepatic : Alk Phos up but < 1.5 x ULN.  AST 57, Other LFTs below ULN.  CBGs - last value improved to 124, all remaining <200.  TGs - WNL, last checked 8/10  Prealbumin - 6.1 (8/10)  Dietary intake remains poor according to charting. Stools are less frequent and reportedly are formed now.     Plan:   Continue Clinimix E 5/15 at 70ml29m(goal rate).  Continue 20% fat emulsion at 40ml/37m TPN to contain standard multivitamins and trace elements.  Cont carbohydrate-modified diet.  Follow CBGs    RD to monitor & analyze calorie count on Mon 8/17-follow  Routine TNA lab panel on Mondays & Thursdays.  Ellen Dolly Rias/15/2015, 9:25 AM Pager [phone]

## 2014-05-08 ENCOUNTER — Inpatient Hospital Stay (HOSPITAL_COMMUNITY): Payer: Medicare Other

## 2014-05-08 DIAGNOSIS — K7460 Unspecified cirrhosis of liver: Secondary | ICD-10-CM

## 2014-05-08 DIAGNOSIS — J9601 Acute respiratory failure with hypoxia: Secondary | ICD-10-CM | POA: Diagnosis present

## 2014-05-08 DIAGNOSIS — L02419 Cutaneous abscess of limb, unspecified: Secondary | ICD-10-CM

## 2014-05-08 DIAGNOSIS — J90 Pleural effusion, not elsewhere classified: Secondary | ICD-10-CM | POA: Diagnosis present

## 2014-05-08 DIAGNOSIS — L03119 Cellulitis of unspecified part of limb: Secondary | ICD-10-CM

## 2014-05-08 DIAGNOSIS — R188 Other ascites: Secondary | ICD-10-CM | POA: Diagnosis present

## 2014-05-08 LAB — BASIC METABOLIC PANEL
Anion gap: 8 (ref 5–15)
BUN: 46 mg/dL — AB (ref 6–23)
CHLORIDE: 101 meq/L (ref 96–112)
CO2: 22 mEq/L (ref 19–32)
Calcium: 10.1 mg/dL (ref 8.4–10.5)
Creatinine, Ser: 1.32 mg/dL — ABNORMAL HIGH (ref 0.50–1.10)
GFR calc Af Amer: 42 mL/min — ABNORMAL LOW (ref >90)
GFR, EST NON AFRICAN AMERICAN: 36 mL/min — AB (ref >90)
Glucose, Bld: 116 mg/dL — ABNORMAL HIGH (ref 70–99)
Potassium: 5.4 mEq/L — ABNORMAL HIGH (ref 3.7–5.3)
Sodium: 131 mEq/L — ABNORMAL LOW (ref 137–147)

## 2014-05-08 LAB — GLUCOSE, CAPILLARY
GLUCOSE-CAPILLARY: 112 mg/dL — AB (ref 70–99)
Glucose-Capillary: 147 mg/dL — ABNORMAL HIGH (ref 70–99)
Glucose-Capillary: 134 mg/dL — ABNORMAL HIGH (ref 70–99)

## 2014-05-08 LAB — PRO B NATRIURETIC PEPTIDE: Pro B Natriuretic peptide (BNP): 71.3 pg/mL (ref 0–450)

## 2014-05-08 MED ORDER — VANCOMYCIN HCL IN DEXTROSE 750-5 MG/150ML-% IV SOLN
750.0000 mg | INTRAVENOUS | Status: DC
  Administered 2014-05-09 – 2014-05-11 (×3): 750 mg via INTRAVENOUS
  Filled 2014-05-08 (×3): qty 150

## 2014-05-08 MED ORDER — FAT EMULSION 20 % IV EMUL
250.0000 mL | INTRAVENOUS | Status: AC
  Administered 2014-05-08: 250 mL via INTRAVENOUS
  Filled 2014-05-08: qty 250

## 2014-05-08 MED ORDER — VANCOMYCIN HCL IN DEXTROSE 1-5 GM/200ML-% IV SOLN
1000.0000 mg | Freq: Once | INTRAVENOUS | Status: AC
  Administered 2014-05-08: 1000 mg via INTRAVENOUS
  Filled 2014-05-08: qty 200

## 2014-05-08 MED ORDER — CLINIMIX/DEXTROSE (5/15) 5 % IV SOLN
INTRAVENOUS | Status: AC
  Administered 2014-05-08: 18:00:00 via INTRAVENOUS
  Filled 2014-05-08: qty 2000

## 2014-05-08 NOTE — Progress Notes (Signed)
ANTIBIOTIC CONSULT NOTE - INITIAL  Pharmacy Consult for vancomycin Indication: cellulitis  Allergies  Allergen Reactions  . Avelox [Moxifloxacin Hcl In Nacl] Anaphylaxis  . Statins Other (See Comments)    Liver function elevations  . Sulfa Antibiotics Hives and Itching  . Ace Inhibitors     Unknown  . Doxycycline     Unknown  . Infed [Iron Dextran] Other (See Comments)    IV IRON, Unknown  . Infergen [Interferon Alfacon-1]   . Vancomycin Other (See Comments)    Flushing, erythema to neck and face, tolerated vanc at lower infusion rate  . Ciprofloxacin Other (See Comments)    Complains of sore mouth  . Fenofibrate Other (See Comments)    constipation    Patient Measurements: Height: 5\' 6"  (167.6 cm) Weight: 171 lb 8 oz (77.792 kg) IBW/kg (Calculated) : 59.3 Adjusted Body Weight: 67kg  Vital Signs: Temp: 98 F (36.7 C) (08/16 0450) Temp src: Oral (08/16 0450) BP: 135/56 mmHg (08/16 0450) Pulse Rate: 97 (08/16 0450) Intake/Output from previous day: 08/15 0701 - 08/16 0700 In: 250 [P.O.:240; I.V.:10] Out: 1550 [Urine:1550] Intake/Output from this shift: Total I/O In: 10 [I.V.:10] Out: -   Labs:  Recent Labs  05/06/14 0800 05/07/14 0414 05/08/14 0525  WBC  --  9.4  --   HGB  --  12.7  --   PLT  --  163  --   CREATININE 1.26* 1.28* 1.32*   Estimated Creatinine Clearance: 34 ml/min (by C-G formula based on Cr of 1.32).   Medical History: Past Medical History  Diagnosis Date  . Anemia, iron deficiency 08/07/2011  . Angiodysplasia of intestinal tract 08/07/2011  . Hypertension   . Arthritis   . Diverticulitis   . Phlebitis   . Macular degeneration   . TIA (transient ischemic attack)   . MI (myocardial infarction)   . Hiatal hernia   . AVM (arteriovenous malformation) of colon with hemorrhage   . Varicose veins of legs   . Cataract   . GERD (gastroesophageal reflux disease)   . DJD (degenerative joint disease)   . Diabetes mellitus     borderline   . History of blood transfusion   . PONV (postoperative nausea and vomiting)   . hepatocellar ca dx'd 08/2012  . Edema leg   . Fatty liver   . Vitamin D deficiency     Medications:  Scheduled:  . benzonatate  100 mg Oral BID  . cholecalciferol  1,000 Units Oral Daily  . fluconazole (DIFLUCAN) IV  100 mg Intravenous Q24H  . insulin aspart  0-9 Units Subcutaneous TID WC  . lactose free nutrition  237 mL Oral TID PC & HS  . megestrol  400 mg Oral BID  . metroNIDAZOLE  500 mg Oral 3 times per day  . nystatin  5 mL Oral QID  . pantoprazole  80 mg Oral Daily  . saccharomyces boulardii  250 mg Oral BID  . sodium chloride  10-40 mL Intracatheter Q12H  . vancomycin  1,000 mg Intravenous Once  . [START ON 05/09/2014] vancomycin  750 mg Intravenous Q24H   Infusions:  . sodium chloride Stopped (05/05/14 2000)  . Marland KitchenTPN (CLINIMIX-E) Adult 70 mL/hr at 05/07/14 1714   And  . fat emulsion 250 mL (05/07/14 1713)  . TPN (CLINIMIX) Adult without lytes     And  . fat emulsion     Assessment: 23 yoF admitted 8/3 after a fall and c/o of fever. Patient is well known  to pharmacy for [facility] consult. Patient currently being treated for C.difficile secondary to recent admission (7/26-7/29) antibiotic course for cellulitis, patient was treated with vancomycin inpatient and then sent home on a course of cephalexin. Antibiotics were held on admission. Pharmacy has now been consulted to dose vancomycin for cellulitis. Noted patient with adverse reaction of flushing to vancomycin but can tolerate administration with lower infusion rate  Antiinfectives 8/7 >> fluconazole x1 8/4 >> metronidazole (MD) >> 8/12 >> fluconazole (MD) >> 8/16 >> vancomycin >>  Labs / vitals Tmax: afebrile WBCs: WNL on 8/15 Renal: SCr 1.32 (baseline ~1.0), CrCl 35 ml/min CG/N  Microbiology 8/3 blood x2: NGF 8/3 urine: NGF  8/3 stool: no salmonella, shigella, campylobacter, yersinia, or E.coli 0157:H7 8/3 urine: 50K Klebsiella  ornithinolytica (R=amp, Ancef, CTX, Zosyn; I=macrobid; S=cipro, gent, tobra, Bactrim) 8/4 C.diff PCR: POSITIVE 8/7 R pleural effusion fluid: NGF 8/11 urine: NGF   Goal of Therapy:  Vancomycin trough level 10-15 mcg/ml  Plan:  - vancomycin 1g IV x1 to run over 120 min - vancomycin 750mg  IV q24h to start 8/17 at 1200, each dose to run over 120 min - vancomycin trough at steady state if indicated - follow-up clinical course, culture results, renal function - follow-up antibiotic de-escalation and length of therapy  Thank you for the consult.  Currie Paris, PharmD, BCPS Pager: [phone] Pharmacy: [phone] 05/08/2014 11:37 AM

## 2014-05-08 NOTE — Progress Notes (Signed)
IR noted consideration of PleurX catheter placement for recurrent right pleural effusion. Pt had another thoracentesis yesterday with 1.1L removed, which makes 4th time since 8/7. Cytology has been negative for malignant cells. Briefly discussed this as an option with both pt and daughter. They are open to the idea, but would like to discuss with Dr. Marin Olp first.  Explained procedure, risks, complications, and care of catheter once placed. Will make NPO p MN in case Dr. Marin Olp and family decide they would like to proceed.  Ascencion Dike PA-C Interventional Radiology 05/08/2014 9:44 AM

## 2014-05-08 NOTE — Progress Notes (Signed)
Progress Note   [name] BDZ:[id number] DOB: March 11, 1928 DOA: 04/25/2014 PCP: Alesia Richards, MD   Brief Narrative:   [name] is an 80 y.o. female with a PMH of hypertension, prior history of MI, TIA, hepatoma who presented to [facility] ED 04/25/2014 status post fall at home. Pt also reported nausea and vomiting and having loose bowel movements. On admission, x-ray studies were done of the lumbar spine and pelvis but did not reveal acute fractures. CT head and CT cervical spine did not show acute intracranial findings. Initial chest x-ray showed no acute cardiopulmonary process. Blood work revealed mild hyponatremia, sodium 136.  Creatinine was 1.45 and calcium 10.6. Urinalysis showed only trace leukocytes. Hospital course has been complicated by the development of C.diff diarrhea (in the setting of recent antibiotic use for treatment of cellulitis) and a recurrent right-sided pleural effusion s/p 4 ultrasound guided thoracentesis since the admission with IR consulting for consideration of PleurX catheter. Her abdomen continues to be distended but based on abdominal US there is not enough ascites for drainage. Pt requires TNA for nutritional support. Plan is to wean her off TNA and hopefully discharge her to [facility] per family preference.   Assessment/Plan:   Principal Problem:  Acute respiratory failure with hypoxia / Persistent right pleural effusion   Initial CXR did not show acute cardiopulmonary findings.  CT abdomen done 04/28/2014 to evaluate abdominal distention, which incidentally showed a moderate right pleural effusion.   On 04/29/2014, pt felt more short of breath so a CXR was done which showed large right pleural effusion. Pt received lasix 20 mg IV and then underwent US guided thoracentesis with 1.2 L fluid removed. Cytology results were negative for malignancy. Fluid culture showed no growth.   The patient has had a total of 4 thoracenteses since admission, and IR  consulted for consideration of a Pleurx catheter.  Family wishes to discuss with Dr. Marin Olp before proceeding.   Continue Tessalon capsules for cough twice daily. Continue bronchodilator treatments, Xopenex and Atrovent as needed for shortness of breath or wheezing.  Pro BNP not elevated.  2-D echocardiogram done 05/05/14, EF 65-70% with grade 1 diastolic dysfunction.  Effusion may be related to hypoalbuminemic state, cirrhosis with malignant effusion less likely given negative cytology.  Active Problems:  Recurrent left lower extremity cellulitis  Unfortunately, will need further antibiotics but challenging with history of multiple antibiotic allergies.  Will use Vancomycin (adverse reaction only) and run infusion in at low rate.  Wound care RN evaluation requested.  Abdominal ascites with history of hepatoma and cirrhosis secondary to NASH   Paracentesis attempted 3 times and per IR not significant amount of ascites seen.  The patient is status post radiofrequency ablation of hepatoma and typically takes spironolactone (hold secondary to hyperkalemia).  Fall, generalized weakness   Likely due to dehydration. Per PT evaluation, pt requires SNF on discharge and she and her family are agreeable to SNF placement.   Oral thrush   Continue fluconazole and nystatin swish and swallow. Oral thrush is improving.  Hypertension   Blood pressure at goal. All antihypertensives on hold.  C.diff enteritis /Abdominal distention   Flagyl started 04/26/2014; due to ongoing diarrhea flexiseal was inserted 1 day after the admission. Discontinued Flexiseal 05/03/2014. No further reports of diarrhea.  Acute renal failure   Likely secondary to dehydration and diuretic therapy.  Baseline creatinine 0.9-1.0.  Hypokalemia   Likely due to GI losses and lasix.   Potassium actually high  today, hold spironolactone.  Hypercalcemia   Likely due to dehydration. Resolved with IV fluids.  Severe  protein calorie malnutrition   Nutrition consulted   Severely low prealbumin.   Wean TNA. Continue megestrol 400 mg PO BID.  DVT Prophylaxis   SCD's due to initial mild thrombocytopenia. Platelet count normalized as of 05/05/2014.  Code Status: Full. Family Communication: Daughter Suanne Marker updated at bedside. Disposition Plan: SNF when stable.   IV Access:    PICC placed 04/30/14.   Procedures and diagnostic studies:    Dg Chest 2 View 04/25/2014 IMPRESSION: No acute cardiopulmonary process. Cardiomegaly with mild central vascular congestion reidentified.   Dg Lumbar Spine Complete 04/25/2014 IMPRESSION: No acute osseous abnormality of the lumbar spine   Dg Pelvis 1-2 Views8/11/2013 IMPRESSION: Negative.   Ct Head Wo Contrast 04/25/2014 IMPRESSION: CT head: No acute intracranial process. Normal noncontrast CT of the head for age. CT cervical spine: Straightened cervical lordosis without acute fracture. Grade 1 C5-6 anterolisthesis on degenerative basis.   Ct Cervical Spine Wo Contrast 04/25/2014 IMPRESSION: CT head: No acute intracranial process. Normal noncontrast CT of the head for age. CT cervical spine: Straightened cervical lordosis without acute fracture. Grade 1 C5-6 anterolisthesis on degenerative basis.   Dg Chest [facility] 04/25/2014 IMPRESSION: Retrocardiac opacity which could represent atelectasis given the degree of hypoaeration although pneumonia could appear similar. If symptoms persist, consider PA and lateral chest radiographs obtained at full inspiration when the patient is clinically able.   CT abdomen 04/28/2014 Morphologic features of the liver compatible with cirrhosis with stigmata of portal venous hypertension. 2. Increase in abdominal ascites. 3. Hepatoma site within the segment 6 of the liver is slightly increased in size from previous exam. 4. Large right pleural effusion. 5. Large hiatal hernia 6. Atherosclerotic disease 7. Prominent retroperitoneal adenopathy is  nonspecific in the setting of cirrhosis and portal venous hypertension. This appears similar to previous exam. 8. Wall thickening of the colon is also nonspecific in the setting of portal venous hypertension. This may be due to a hypoproteinemic state or colitis.   Dg Chest 04/29/2014 Marked improvement RIGHT pleural effusion. No pneumothorax.   Dg Chest [facility] 04/29/2014 Significant increase in a right-sided pleural effusion when compared with the prior exam. Hiatal hernia.   US Thoracentesis Asp Pleural Space W/img Guide 04/29/2014 Successful ultrasound guided diagnostic and therapeutic right thoracentesis yielding 1.2 liters of pleural fluid.   TPN 04/30/2014   Dg Chest Port 05/01/2014 1.Interval placement of right upper extremity PICC with tip terminating in the proximal superior vena cava. 2. Interval accumulation of a moderate to large right-sided pleural effusion. There is also a small left pleural effusion. 3. Bibasilar atelectasis and/or consolidation.   US Thoracentesis Asp Pleural Space W/img Guide 05/02/2014 Successful ultrasound guided diagnostic and therapeutic right thoracentesis yielding 1.1 liters of pleural fluid.   Dg Chest 05/02/2014 Resolution of right pleural effusion, without pneumothorax. Bibasilar airspace disease which could represent atelectasis or infection.   US Abdomen 05/02/2014 No significant amount of ascites to perform a therapeutic paracentesis.   DG abdomen 05/03/2014 - no bowel obstruction, small bilateral pleural effusion, on right seems to be resolving; possible interstitial edema   US Thoracentesis Pleural Space 05/04/2014 Successful ultrasound guided therapeutic right thoracentesis yielding 900 cc's of pleural fluid.   CT chest w/o contrast 05/05/2014 - moderate right pleural effusion, no frank interstitial edema; cirrhosis   DG chest 05/05/2014 - pleural effusions and basilar atelectasis larger on left. Hiatal hernia.   2-D echocardiogram 05/05/14:  EF 65-70%. No  regional wall motion abnormalities. Grade 1 diastolic dysfunction.  US Abdomen Limited 05/06/2014 No significant amount of ascites to perform paracentesis.   Dg Chest Port 1 View 05/07/2014 Worsening RIGHT pleural effusion.     Medical Consultants:    Dr. Burney Gauze, Oncology.  Interventional radiology   Other Consultants:    Dietitian  Physical therapy  Wound care RN   Anti-Infectives:    Flagyl 04/26/14--->  Fluconazole 05/04/14--->  Vancomycin 05/08/14--->  Subjective:   [name] feels weak and largely without an appetite.  Taking in sips of Boost, bites of food.  No current complaints of dyspnea at rest, but becomes dyspneic with activity or when lying on right side.  No current complaints of nausea, vomiting or diarrhea.  Objective:    Filed Vitals:   05/07/14 1500 05/07/14 1515 05/07/14 2016 05/08/14 0450  BP: 147/55 140/55 138/54 135/56  Pulse:   98 97  Temp:   98.6 F (37 C) 98 F (36.7 C)  TempSrc:   Oral Oral  Resp:   28 22  Height:      Weight:      SpO2:   93% 92%    Intake/Output Summary (Last 24 hours) at 05/08/14 1111 Last data filed at 05/08/14 0938  Gross per 24 hour  Intake     10 ml  Output   1550 ml  Net  -1540 ml    Exam: Gen:  NAD, weak Cardiovascular:  RRR, No M/R/G Respiratory:  Lungs diminished right base Gastrointestinal:  Abdomen softly distended, NT, + BS Extremities:  Venous stasis dermatitis changes BLE, with erythema/ulcer to LLE as pictured below:         Data Reviewed:    Labs: Basic Metabolic Panel:  Recent Labs Lab 05/02/14 0600 05/03/14 0500 05/04/14 0615 05/05/14 0500 05/06/14 0800 05/07/14 0414 05/08/14 0525  NA 138 136* 137 137 130* 130* 131*  K 3.9 4.3 4.4 4.8 4.9 5.2 5.4*  CL 104 104 105 104 100 99 101  CO2 23 24 24 25 20 22 22   GLUCOSE 122* 142* 123* 122* 187* 123* 116*  BUN 24* 26* 31* 37* 42* 43* 46*  CREATININE 1.17* 1.18* 1.16* 1.33* 1.26* 1.28* 1.32*  CALCIUM 9.0 9.2 9.4  9.9 9.7 9.9 10.1  MG 2.0 1.9  --  1.9  --   --   --   PHOS 3.5 2.9  --  3.7  --   --   --    GFR Estimated Creatinine Clearance: 34 ml/min (by C-G formula based on Cr of 1.32). Liver Function Tests:  Recent Labs Lab 05/02/14 0600 05/05/14 0500 05/07/14 0414  AST 36 35 57*  ALT 24 19 26   ALKPHOS 119* 123* 140*  BILITOT 0.5 0.6 0.8  PROT 6.2 6.3 6.8  ALBUMIN 2.4* 2.2* 2.3*   CBC:  Recent Labs Lab 05/02/14 0600 05/05/14 0500 05/07/14 0414  WBC 9.2 8.8 9.4  NEUTROABS 5.1  --   --   HGB 12.6 12.5 12.7  HCT 36.9 36.7 37.9  MCV 93.2 93.1 93.8  PLT 134* 155 163   BNP (last 3 results)  Recent Labs  05/08/14 0525  PROBNP 71.3   CBG:  Recent Labs Lab 05/06/14 1139 05/06/14 1613 05/07/14 0720 05/07/14 1225 05/07/14 1639  GLUCAP 160* 165* 124* 116* 160*    Microbiology Recent Results (from the past 240 hour(s))  BODY FLUID CULTURE     Status: None   Collection Time  04/29/14  2:33 PM      Result Value Ref Range Status   Specimen Description FLUID RIGHT PLEURAL EFFUSION   Final   Special Requests Normal   Final   Gram Stain     Final   Value: NO WBC SEEN     NO ORGANISMS SEEN     Performed at [facility]   Culture     Final   Value: NO GROWTH 3 DAYS     Performed at [facility]   Report Status 05/02/2014 FINAL   Final  URINE CULTURE     Status: None   Collection Time    05/03/14 11:50 AM      Result Value Ref Range Status   Specimen Description URINE, CATHETERIZED   Final   Special Requests flagyl Immunocompromised   Final   Culture  Setup Time     Final   Value: 05/03/2014 14:36     Performed at [place]     Final   Value: NO GROWTH     Performed at [facility]   Culture     Final   Value: NO GROWTH     Performed at [facility]   Report Status 05/04/2014 FINAL   Final     Medications:   . benzonatate  100 mg Oral BID  . cholecalciferol  1,000 Units Oral Daily  . fluconazole  (DIFLUCAN) IV  100 mg Intravenous Q24H  . insulin aspart  0-9 Units Subcutaneous TID WC  . lactose free nutrition  237 mL Oral TID PC & HS  . megestrol  400 mg Oral BID  . metroNIDAZOLE  500 mg Oral 3 times per day  . nystatin  5 mL Oral QID  . pantoprazole  80 mg Oral Daily  . saccharomyces boulardii  250 mg Oral BID  . sodium chloride  10-40 mL Intracatheter Q12H   Continuous Infusions: . sodium chloride Stopped (05/05/14 2000)  . Marland KitchenTPN (CLINIMIX-E) Adult 70 mL/hr at 05/07/14 1714   And  . fat emulsion 250 mL (05/07/14 1713)  . TPN (CLINIMIX) Adult without lytes     And  . fat emulsion      Time spent: 35 minutes with > 50% of time discussing current diagnostic test results, clinical impression and plan of care.    LOS: 13 days   [facility] Pager [phone]. If unable to reach me by pager, please call my cell phone at [phone].  *Please refer to [email or web address], password [email or web address] to get updated schedule on who will round on this patient, as hospitalists switch teams weekly. If 7PM-7AM, please contact night-coverage at [email or web address], password [email or web address] for any overnight needs.  05/08/2014, 11:11 AM    **Disclaimer: This note was dictated with voice recognition software. Similar sounding words can inadvertently be transcribed and this note may contain transcription errors which may not have been corrected upon publication of note.**

## 2014-05-08 NOTE — Progress Notes (Signed)
PARENTERAL NUTRITION CONSULT NOTE - Follow up  Pharmacy Consult for TPN Indication: severe malnutrition in setting of hepatocellular carcinoma, chronic active hepatitis, and C.diff - associated diarrhea  Allergies  Allergen Reactions  . Avelox [Moxifloxacin Hcl In Nacl] Anaphylaxis  . Statins Other (See Comments)    Liver function elevations  . Sulfa Antibiotics Hives and Itching  . Ace Inhibitors     Unknown  . Doxycycline     Unknown  . Infed [Iron Dextran] Other (See Comments)    IV IRON, Unknown  . Infergen [Interferon Alfacon-1]   . Vancomycin Other (See Comments)    Flushing, erythema to neck and face, tolerated vanc at lower infusion rate  . Ciprofloxacin Other (See Comments)    Complains of sore mouth  . Fenofibrate Other (See Comments)    constipation   Patient Measurements: Height: _0  (167.6 cm) Weight: 171 lb 8 oz (77.792 kg) IBW/kg (Calculated) : 59.3 No updated weight since 8/3  Vital Signs: Temp: 98 F (36.7 C) (08/16 0450) Temp src: Oral (08/16 0450) BP: 135/56 mmHg (08/16 0450) Pulse Rate: 97 (08/16 0450) Intake/Output from previous day: 08/15 0701 - 08/16 0700 In: 250 [P.O.:240; I.V.:10] Out: 1550 [Urine:1550]  Intake/Output from this shift: Total I/O In: 10 [I.V.:10] Out: -   Labs:  Recent Labs  05/07/14 0414  WBC 9.4  HGB 12.7  HCT 37.9  PLT 163    Recent Labs  05/06/14 0800 05/07/14 0414 05/08/14 0525  NA 130* 130* 131*  K 4.9 5.2 5.4*  CL 100 99 101  CO2 _1 GLUCOSE 187* 123* 116*  BUN 42* 43* 46*  CREATININE 1.26* 1.28* 1.32*  CALCIUM 9.7 9.9 10.1  PROT  --  6.8  --   ALBUMIN  --  2.3*  --   AST  --  57*  --   ALT  --  26  --   ALKPHOS  --  140*  --   BILITOT  --  0.8  --   Corrected calcium: 11.1 Estimated Creatinine Clearance: 34 ml/min (by C-G formula based on Cr of 1.32).    Recent Labs  05/07/14 0720 05/07/14 1225 05/07/14 1639  GLUCAP 124* 116* 160*    Medications:  Scheduled:  .  benzonatate  100 mg Oral BID  . cholecalciferol  1,000 Units Oral Daily  . fluconazole (DIFLUCAN) IV  100 mg Intravenous Q24H  . insulin aspart  0-9 Units Subcutaneous TID WC  . lactose free nutrition  237 mL Oral TID PC & HS  . megestrol  400 mg Oral BID  . metroNIDAZOLE  500 mg Oral 3 times per day  . nystatin  5 mL Oral QID  . pantoprazole  80 mg Oral Daily  . saccharomyces boulardii  250 mg Oral BID  . sodium chloride  10-40 mL Intracatheter Q12H   Infusions:  . sodium chloride Stopped (05/05/14 2000)  . Marland KitchenTPN (CLINIMIX-E) Adult 70 mL/hr at 05/07/14 1714   And  . fat emulsion 250 mL (05/07/14 1713)    Assessment: 41 yoF with hepatocellular carcinoma and chronic active hepatitis admitted 8/3 after LOC at home associated with recent nausea and loose stools. Was previously admitted 7/26 for cellulitis; received Rocephin x 1 dose, vancomycin x 3 doses, discharged on po Keflex. Stool tested positive for C.diff 8/4. Poor po intake PTA, recent 10 lb weight loss noted. Orders were received from oncology to begin TPN 8/8 for malnutrition.  Pharmacy dosing assistance was requested.  Nutritional Goals:  RD recs 8/5: 75-85 grams of protein and 1550-1750 kCal per day. Clinimix E 5/15 at a goal rate of 1m/hr + 20% fat emulsion at 152mhr provides 84g/day protein, 1672 Kcal/day.   Significant events: 8/11: TPN advanced to goal. Flexiseal dc'd. Megace started for appetite stimulation. 8/12: Diflucan started for thrush.  8/12: R thoracentesis for drainage of pleural effusion 8/13: Planning paracentesis today 8/14: Paracentesis canceled   8/16: D#9 TPN TPN Access:  PICC (placed 8/8)   Current Nutrition:  Clinimix E 5/15 at 7078mr + 20% fat emulsion at 7m30m Carb modified diet Boost Plus t.i.d.  IVF: NS at 10 mL/hr only when administering IV medications  Insulin Requirements in the past 24 hours:  3 units sliding scale Novolog; on sensitive-scale SSI  TID   Labs:  Electrolytes - Na slightly low; unable to adjust Na content of premixed Clinimix-E. K= 5.4  Other lytes WNL.  Renal: SCr stable ; BUN still rising; estimated CrCl ~35 mL/min  Hepatic : Alk Phos up but < 1.5 x ULN.  AST 57, Other LFTs below ULN.  CBGs - last value 160, all remaining <200.  TGs - WNL, last checked 8/10  Prealbumin - 6.1 (8/10)  Dietary intake remains poor according to charting. Stools are less frequent and reportedly are formed now.     Plan:   Change to Clinimix 5/15 at 70ml62m(goal rate) due to rising KCl  Continue 20% fat emulsion at 7ml/18m TPN to contain standard multivitamins and trace elements.  Cont carbohydrate-modified diet.  Follow CBGs    RD to monitor & analyze calorie count on Mon 8/17-follow  Routine TNA lab panel on Mondays & Thursdays.  Ellen Dolly Rias/16/2015, 9:55 AM Pager [phone]

## 2014-05-08 NOTE — Progress Notes (Signed)
Family at bedside. Pt continues to have dry cough. CXR done per daughter's request. No changes noted.

## 2014-05-08 NOTE — Consult Note (Signed)
WOC wound consult note Reason for Consult: Chronic venous insufficiency with new onset partial thickness ulceration at the left medial LE. Has been seen in the past at the outpatient wound care center at [facility] for ulceration of the right LE which has resolved.   Wound type:Venous insufficiency with new onset partial thickness ulceration  Pressure Ulcer POA: No Measurement: 6.5cm x 3.5cm x 0.2cm Wound bed: scattered islands of epithelial tissue remain throughout wound bed Drainage (amount, consistency, odor) Scant serous Periwound:Intact, dry with hemosiderin staining (bilaterally) Dressing procedure/placement/frequency: I will treat the partial thickness skin loss with a soft silicone foam dressing and support venous return with a gentle compression using an ACE wrap and LE elevation while in house. Should the ulcer not respond promptly or decline, she may require referral back to the outpatient wound care center for continued followup. [facility] nursing team will not follow, but will remain available to this patient, the nursing and medical team.  Please re-consult if needed. Thanks, Maudie Flakes, MSN, RN, [place], [place], [place] [phone])

## 2014-05-09 ENCOUNTER — Encounter (HOSPITAL_COMMUNITY): Payer: Self-pay | Admitting: Radiology

## 2014-05-09 ENCOUNTER — Telehealth: Payer: Self-pay

## 2014-05-09 ENCOUNTER — Inpatient Hospital Stay (HOSPITAL_COMMUNITY): Payer: Medicare Other

## 2014-05-09 DIAGNOSIS — R5381 Other malaise: Secondary | ICD-10-CM

## 2014-05-09 DIAGNOSIS — R5383 Other fatigue: Secondary | ICD-10-CM

## 2014-05-09 LAB — DIFFERENTIAL
Basophils Absolute: 0 10*3/uL (ref 0.0–0.1)
Basophils Relative: 0 % (ref 0–1)
EOS ABS: 0.3 10*3/uL (ref 0.0–0.7)
EOS PCT: 2 % (ref 0–5)
Lymphocytes Relative: 12 % (ref 12–46)
Lymphs Abs: 1.9 10*3/uL (ref 0.7–4.0)
MONOS PCT: 11 % (ref 3–12)
Monocytes Absolute: 1.7 10*3/uL — ABNORMAL HIGH (ref 0.1–1.0)
Neutro Abs: 11.4 10*3/uL — ABNORMAL HIGH (ref 1.7–7.7)
Neutrophils Relative %: 75 % (ref 43–77)

## 2014-05-09 LAB — MAGNESIUM: Magnesium: 1.7 mg/dL (ref 1.5–2.5)

## 2014-05-09 LAB — PREALBUMIN: Prealbumin: 11.8 mg/dL — ABNORMAL LOW (ref 17.0–34.0)

## 2014-05-09 LAB — COMPREHENSIVE METABOLIC PANEL
ALBUMIN: 2.3 g/dL — AB (ref 3.5–5.2)
ALK PHOS: 136 U/L — AB (ref 39–117)
ALT: 31 U/L (ref 0–35)
ANION GAP: 8 (ref 5–15)
AST: 61 U/L — AB (ref 0–37)
BUN: 47 mg/dL — AB (ref 6–23)
CHLORIDE: 99 meq/L (ref 96–112)
CO2: 21 mEq/L (ref 19–32)
Calcium: 9.7 mg/dL (ref 8.4–10.5)
Creatinine, Ser: 1.27 mg/dL — ABNORMAL HIGH (ref 0.50–1.10)
GFR calc Af Amer: 44 mL/min — ABNORMAL LOW (ref >90)
GFR, EST NON AFRICAN AMERICAN: 38 mL/min — AB (ref >90)
Glucose, Bld: 116 mg/dL — ABNORMAL HIGH (ref 70–99)
POTASSIUM: 4.9 meq/L (ref 3.7–5.3)
Sodium: 128 mEq/L — ABNORMAL LOW (ref 137–147)
Total Bilirubin: 1.2 mg/dL (ref 0.3–1.2)
Total Protein: 6.8 g/dL (ref 6.0–8.3)

## 2014-05-09 LAB — CBC
HCT: 37.2 % (ref 36.0–46.0)
HEMOGLOBIN: 12.5 g/dL (ref 12.0–15.0)
MCH: 31.6 pg (ref 26.0–34.0)
MCHC: 33.6 g/dL (ref 30.0–36.0)
MCV: 94.2 fL (ref 78.0–100.0)
Platelets: 168 10*3/uL (ref 150–400)
RBC: 3.95 MIL/uL (ref 3.87–5.11)
RDW: 14.6 % (ref 11.5–15.5)
WBC: 15.3 10*3/uL — ABNORMAL HIGH (ref 4.0–10.5)

## 2014-05-09 LAB — TRIGLYCERIDES: Triglycerides: 51 mg/dL (ref <150)

## 2014-05-09 LAB — GLUCOSE, CAPILLARY
Glucose-Capillary: 124 mg/dL — ABNORMAL HIGH (ref 70–99)
Glucose-Capillary: 119 mg/dL — ABNORMAL HIGH (ref 70–99)

## 2014-05-09 LAB — PHOSPHORUS: Phosphorus: 3 mg/dL (ref 2.3–4.6)

## 2014-05-09 MED ORDER — HYDROCODONE-ACETAMINOPHEN 5-325 MG PO TABS
1.0000 | ORAL_TABLET | ORAL | Status: DC | PRN
  Administered 2014-05-09 – 2014-05-10 (×4): 2 via ORAL
  Filled 2014-05-09 (×4): qty 2

## 2014-05-09 MED ORDER — MIDAZOLAM HCL 2 MG/2ML IJ SOLN
INTRAMUSCULAR | Status: AC
  Filled 2014-05-09: qty 6

## 2014-05-09 MED ORDER — MIDAZOLAM HCL 2 MG/2ML IJ SOLN
INTRAMUSCULAR | Status: AC | PRN
  Administered 2014-05-09 (×3): 1 mg via INTRAVENOUS
  Administered 2014-05-09: 0.5 mg via INTRAVENOUS

## 2014-05-09 MED ORDER — FENTANYL CITRATE 0.05 MG/ML IJ SOLN
INTRAMUSCULAR | Status: AC
  Filled 2014-05-09: qty 6

## 2014-05-09 MED ORDER — FENTANYL CITRATE 0.05 MG/ML IJ SOLN
INTRAMUSCULAR | Status: AC | PRN
  Administered 2014-05-09 (×2): 25 ug via INTRAVENOUS
  Administered 2014-05-09: 50 ug via INTRAVENOUS

## 2014-05-09 MED ORDER — SPIRONOLACTONE 50 MG PO TABS
50.0000 mg | ORAL_TABLET | Freq: Every day | ORAL | Status: DC
  Administered 2014-05-09: 50 mg via ORAL
  Filled 2014-05-09 (×2): qty 1

## 2014-05-09 MED ORDER — LIDOCAINE HCL 1 % IJ SOLN
INTRAMUSCULAR | Status: AC
  Filled 2014-05-09: qty 20

## 2014-05-09 MED ORDER — TRACE MINERALS CR-CU-F-FE-I-MN-MO-SE-ZN IV SOLN
INTRAVENOUS | Status: DC
  Administered 2014-05-09: 18:00:00 via INTRAVENOUS
  Filled 2014-05-09: qty 1000

## 2014-05-09 MED ORDER — FAT EMULSION 20 % IV EMUL
250.0000 mL | INTRAVENOUS | Status: DC
  Administered 2014-05-09: 250 mL via INTRAVENOUS
  Filled 2014-05-09: qty 250

## 2014-05-09 NOTE — H&P (Signed)
Chief Complaint: "Shortness of breath.' Referring Physician: Dr. Rockne Menghini HPI: [name] is an 80 y.o. female with hepatocellular carcinoma and recurrent right pleural effusions s/p multiple thoracentesis. Last thoracentesis 05/07/14 1.1 liters removed and patient complains of increased shortness of breath starting the day after procedure. She has had a total of (4) right sided thoracentesis procedures the month of august. IR received request for image guided tunneled right pleural catheter placement. The patient and her daughter are present today for discussion of catheter and would like to proceed. She denies any active fever or chills. She will be discharged to SNF. Oncology and TRH are involved and in agreement.   Past Medical History:  Past Medical History  Diagnosis Date  . Anemia, iron deficiency 08/07/2011  . Angiodysplasia of intestinal tract 08/07/2011  . Hypertension   . Arthritis   . Diverticulitis   . Phlebitis   . Macular degeneration   . TIA (transient ischemic attack)   . MI (myocardial infarction)   . Hiatal hernia   . AVM (arteriovenous malformation) of colon with hemorrhage   . Varicose veins of legs   . Cataract   . GERD (gastroesophageal reflux disease)   . DJD (degenerative joint disease)   . Diabetes mellitus     borderline  . History of blood transfusion   . PONV (postoperative nausea and vomiting)   . hepatocellar ca dx'd 08/2012  . Edema leg   . Fatty liver   . Vitamin D deficiency     Past Surgical History:  Past Surgical History  Procedure Laterality Date  . 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 Abdominal hysterectomy    . Hemorrhoid surgery    . Esophagogastroduodenoscopy  08/30/2012    Procedure: ESOPHAGOGASTRODUODENOSCOPY (EGD);  Surgeon: Jeryl Columbia, MD;  Location: [facility];  Service: Endoscopy;  Laterality: N/A;  . Radiofrequency ablation liver tumor Right 01/2013    HFA ablation right liver lesion  . Colonoscopy  06/2009     Dr. Watt Climes, due 5 years  . Endoscopic vein laser treatment Left 2014    Dr. Kellie Simmering    Family History:  Family History  Problem Relation Age of Onset  . Colon cancer Sister   . Coronary artery disease Father   . Heart disease Father     Social History:  reports that she quit smoking about 23 years ago. Her smoking use included Cigarettes. She started smoking about 63 years ago. She has a 200 pack-year smoking history. She has never used smokeless tobacco. She reports that she does not drink alcohol or use illicit drugs.  Allergies:  Allergies  Allergen Reactions  . Avelox [Moxifloxacin Hcl In Nacl] Anaphylaxis  . Statins Other (See Comments)    Liver function elevations  . Sulfa Antibiotics Hives and Itching  . Ace Inhibitors     Unknown  . Doxycycline     Unknown  . Infed [Iron Dextran] Other (See Comments)    IV IRON, Unknown  . Infergen [Interferon Alfacon-1]   . Vancomycin Other (See Comments)    Flushing, erythema to neck and face, tolerated vanc at lower infusion rate  . Ciprofloxacin Other (See Comments)    Complains of sore mouth  . Fenofibrate Other (See Comments)    constipation    Medications:   Medication List    ASK your doctor about these medications       ALPRAZolam 1 MG tablet  Commonly known as:  XANAX  Take 0.5  tablets (0.5 mg total) by mouth at bedtime as needed for sleep or anxiety.     atenolol 50 MG tablet  Commonly known as:  TENORMIN  Take 1 tablet (50 mg total) by mouth daily.     cephALEXin 500 MG capsule  Commonly known as:  KEFLEX  Take 1 capsule (500 mg total) by mouth 3 (three) times daily.     cholecalciferol 1000 UNITS tablet  Commonly known as:  VITAMIN D  Take 1,000 Units by mouth daily.     cyclobenzaprine 10 MG tablet  Commonly known as:  FLEXERIL  Take 10 mg by mouth 2 (two) times daily as needed for muscle spasms.     furosemide 40 MG tablet  Commonly known as:  LASIX  Take 1 tablet (40 mg total) by mouth daily.      lactose free nutrition Liqd  Take 237 mLs by mouth 2 (two) times daily between meals.     NITROSTAT 0.4 MG SL tablet  Generic drug:  nitroGLYCERIN  Place 0.4 mg under the tongue every 5 (five) minutes as needed for chest pain.     omeprazole 40 MG capsule  Commonly known as:  PRILOSEC  Take 40 mg by mouth daily as needed (for reflux).     Pancrelipase (Lip-Prot-Amyl) 24000 UNITS Cpep  Commonly known as:  CREON  Take 1 capsule (24,000 Units total) by mouth 3 (three) times daily before meals.     PROBIOTIC DAILY PO  Take 1 tablet by mouth every morning.     spironolactone 50 MG tablet  Commonly known as:  ALDACTONE  Take 50 mg by mouth every morning.     triamcinolone cream 0.1 %  Commonly known as:  KENALOG  Apply 1 application topically 2 (two) times daily as needed. Eczema       Please HPI for pertinent positives, otherwise complete 10 system ROS negative.  Physical Exam: BP 137/53  Pulse 95  Temp(Src) 98 F (36.7 C) (Oral)  Resp 28  Ht 5\' 6"  (1.676 m)  Wt 171 lb 8 oz (77.792 kg)  BMI 27.69 kg/m2  SpO2 97% Body mass index is 27.69 kg/(m^2).  General Appearance:  Alert, cooperative, appears weak  Head:  Normocephalic, without obvious abnormality, atraumatic  Neck: Supple, symmetrical, trachea midline  Lungs:   Right diminished BS, left CTA  Chest Wall:  No tenderness or deformity  Heart:  Regular rate and rhythm, S1, S2 normal, no murmur, rub or gallop.  Abdomen:   Soft, non-tender, non distended.  Extremities: Extremities normal, atraumatic, no cyanosis or edema  Neurologic: Normal affect, no gross deficits.   Results for orders placed during the hospital encounter of 04/25/14 (from the past 48 hour(s))  GLUCOSE, CAPILLARY     Status: Abnormal   Collection Time    05/07/14  4:39 PM      Result Value Ref Range   Glucose-Capillary 160 (*) 70 - 99 mg/dL  BASIC METABOLIC PANEL     Status: Abnormal   Collection Time    05/08/14  5:25 AM      Result Value Ref  Range   Sodium 131 (*) 137 - 147 mEq/L   Potassium 5.4 (*) 3.7 - 5.3 mEq/L   Chloride 101  96 - 112 mEq/L   CO2 22  19 - 32 mEq/L   Glucose, Bld 116 (*) 70 - 99 mg/dL   BUN 46 (*) 6 - 23 mg/dL   Creatinine, Ser 05/10/14 (*) 0.50 - 1.10 mg/dL  Calcium 10.1  8.4 - 10.5 mg/dL   GFR calc non Af Amer 36 (*) >90 mL/min   GFR calc Af Amer 42 (*) >90 mL/min   Comment: (NOTE)     The eGFR has been calculated using the CKD EPI equation.     This calculation has not been validated in all clinical situations.     eGFR's persistently <90 mL/min signify possible Chronic Kidney     Disease.   Anion gap 8  5 - 15  PRO B NATRIURETIC PEPTIDE     Status: None   Collection Time    05/08/14  5:25 AM      Result Value Ref Range   Pro B Natriuretic peptide (BNP) 71.3  0 - 450 pg/mL  GLUCOSE, CAPILLARY     Status: Abnormal   Collection Time    05/08/14  8:21 AM      Result Value Ref Range   Glucose-Capillary 147 (*) 70 - 99 mg/dL  GLUCOSE, CAPILLARY     Status: Abnormal   Collection Time    05/08/14 11:59 AM      Result Value Ref Range   Glucose-Capillary 112 (*) 70 - 99 mg/dL  GLUCOSE, CAPILLARY     Status: Abnormal   Collection Time    05/08/14  4:46 PM      Result Value Ref Range   Glucose-Capillary 134 (*) 70 - 99 mg/dL  COMPREHENSIVE METABOLIC PANEL     Status: Abnormal   Collection Time    05/09/14  5:52 AM      Result Value Ref Range   Sodium 128 (*) 137 - 147 mEq/L   Potassium 4.9  3.7 - 5.3 mEq/L   Chloride 99  96 - 112 mEq/L   CO2 21  19 - 32 mEq/L   Glucose, Bld 116 (*) 70 - 99 mg/dL   BUN 47 (*) 6 - 23 mg/dL   Creatinine, Ser 1.27 (*) 0.50 - 1.10 mg/dL   Calcium 9.7  8.4 - 10.5 mg/dL   Total Protein 6.8  6.0 - 8.3 g/dL   Albumin 2.3 (*) 3.5 - 5.2 g/dL   AST 61 (*) 0 - 37 U/L   ALT 31  0 - 35 U/L   Alkaline Phosphatase 136 (*) 39 - 117 U/L   Total Bilirubin 1.2  0.3 - 1.2 mg/dL   GFR calc non Af Amer 38 (*) >90 mL/min   GFR calc Af Amer 44 (*) >90 mL/min   Comment: (NOTE)      The eGFR has been calculated using the CKD EPI equation.     This calculation has not been validated in all clinical situations.     eGFR's persistently <90 mL/min signify possible Chronic Kidney     Disease.   Anion gap 8  5 - 15  MAGNESIUM     Status: None   Collection Time    05/09/14  5:52 AM      Result Value Ref Range   Magnesium 1.7  1.5 - 2.5 mg/dL  PHOSPHORUS     Status: None   Collection Time    05/09/14  5:52 AM      Result Value Ref Range   Phosphorus 3.0  2.3 - 4.6 mg/dL  CBC     Status: Abnormal   Collection Time    05/09/14  5:52 AM      Result Value Ref Range   WBC 15.3 (*) 4.0 - 10.5 K/uL   RBC 3.95  3.87 - 5.11 MIL/uL   Hemoglobin 12.5  12.0 - 15.0 g/dL   HCT 37.2  36.0 - 46.0 %   MCV 94.2  78.0 - 100.0 fL   MCH 31.6  26.0 - 34.0 pg   MCHC 33.6  30.0 - 36.0 g/dL   RDW 14.6  11.5 - 15.5 %   Platelets 168  150 - 400 K/uL  DIFFERENTIAL     Status: Abnormal   Collection Time    05/09/14  5:52 AM      Result Value Ref Range   Neutrophils Relative % 75  43 - 77 %   Neutro Abs 11.4 (*) 1.7 - 7.7 K/uL   Lymphocytes Relative 12  12 - 46 %   Lymphs Abs 1.9  0.7 - 4.0 K/uL   Monocytes Relative 11  3 - 12 %   Monocytes Absolute 1.7 (*) 0.1 - 1.0 K/uL   Eosinophils Relative 2  0 - 5 %   Eosinophils Absolute 0.3  0.0 - 0.7 K/uL   Basophils Relative 0  0 - 1 %   Basophils Absolute 0.0  0.0 - 0.1 K/uL  TRIGLYCERIDES     Status: None   Collection Time    05/09/14  5:52 AM      Result Value Ref Range   Triglycerides 51  <150 mg/dL   Comment: Performed at [facility]     Status: Abnormal   Collection Time    05/09/14  5:52 AM      Result Value Ref Range   Prealbumin 11.8 (*) 17.0 - 34.0 mg/dL   Comment: Performed at [facility], CAPILLARY     Status: Abnormal   Collection Time    05/09/14  7:34 AM      Result Value Ref Range   Glucose-Capillary 124 (*) 70 - 99 mg/dL  GLUCOSE, CAPILLARY     Status: Abnormal   Collection  Time    05/09/14 11:29 AM      Result Value Ref Range   Glucose-Capillary 119 (*) 70 - 99 mg/dL   Dg Chest 1 View  05/07/2014   CLINICAL DATA:  Status post right thoracentesis.  EXAM: CHEST - 1 VIEW  COMPARISON:  05/07/2014 at 8:27 a.m.  FINDINGS: Significant decreased pleural fluid is noted on the right. There still right lung base opacity, likely some residual fluid with atelectasis. No pneumothorax.  IMPRESSION: Significant reduction in the right pleural effusion following thoracentesis. No pneumothorax.   Electronically Signed   By: Lajean Manes M.D.   On: 05/07/2014 15:34   Dg Chest Port 1 View  05/08/2014   CLINICAL DATA:  Worsening shortness of breath.  Cough.  EXAM: PORTABLE CHEST - 1 VIEW  COMPARISON:  05/07/2014 and 05/04/2014 and chest CT dated 05/05/2014  FINDINGS: PICC line appears in good position. Heart and other mediastinal structures are shifted to the left chronically.  Right pleural effusion is essentially unchanged considering what I suspect is a slight change in the patient position. There is a chronic huge hiatal hernia. Pulmonary vascularity is normal. No acute osseous abnormality.  IMPRESSION: No significant change.  Persistent moderate right pleural effusion.   Electronically Signed   By: Rozetta Nunnery M.D.   On: 05/08/2014 17:32   US Thoracentesis Asp Pleural Space W/img Guide  05/07/2014   CLINICAL DATA:  History of hepatocellular carcinoma, cirrhosis. Recurrent right pleural effusion. Request made for therapeutic right thoracentesis.  EXAM: ULTRASOUND GUIDED RIGHT THORACENTESIS  COMPARISON:  Previous thoracentesis  PROCEDURE: An ultrasound guided thoracentesis was thoroughly discussed with the patient and questions answered. The benefits, risks, alternatives and complications were also discussed. The patient understands and wishes to proceed with the procedure. Written consent was obtained.  Ultrasound was performed to localize and mark an adequate pocket of fluid in the right  chest. The area was then prepped and draped in the normal sterile fashion. 1% Lidocaine was used for local anesthesia. Under ultrasound guidance a 19 gauge Yueh catheter was introduced. Thoracentesis was performed. The catheter was removed and a dressing applied.  Complications:  None immediate  FINDINGS: A total of approximately 1.1 L of amber colored fluid was removed. A fluid sample was notsent for laboratory analysis.  IMPRESSION: Successful ultrasound guided right thoracentesis yielding 1.1 L of pleural fluid.  Read by: Ascencion Dike PA-C   Electronically Signed   By: Maryclare Bean M.D.   On: 05/07/2014 15:24    Assessment/Plan Hepatocellular carcinoma Recurrent right pleural effusions s/p multiple thoracentesis, cytology with no malignant cells.  Request for right tunneled pleurX catheter placement with moderate sedation.  Patient has been NPO, no blood thinners given, afebrile. LLE cellulitis on vancomycin Recent C. Diff on flagyl with resolved symptoms.  Risks and Benefits discussed with the patient and her daughter today. All of the patient's questions were answered, patient is agreeable to proceed. Consent signed and in chart.   Tsosie Billing D PA-C 05/09/2014, 1:17 PM

## 2014-05-09 NOTE — Procedures (Signed)
Interventional Radiology Procedure Note  Procedure: Placement of RIGHT Pleur-X tunneled pleural drain.  Complications: None Recommendations: - Drain daily per instructions - Pain control  Signed,  Criselda Peaches, MD [facility]

## 2014-05-09 NOTE — Progress Notes (Signed)
PT Cancellation Note  ___Treatment cancelled today due to medical issues with patient which prohibited therapy  _X_ Treatment cancelled today due to patient receiving procedure........ Pleur-X-Cath placement  ___ Treatment cancelled today due to patient's refusal to participate   ___ Treatment cancelled today due to   Rica Koyanagi  PTA [facility]  Acute  Rehab Pager      [phone]

## 2014-05-09 NOTE — Progress Notes (Signed)
[name]   DOB:09/16/1928   TF#:[id number]   YHC#:[id number]  Subjective: [name] is seen this morning. [name] is moaning, short of breath and anxious. [name] is NPO awaiting a possible Pleur-x catheter placement. Very weak, ill appearing  Scheduled Meds: . benzonatate  100 mg Oral BID  . cholecalciferol  1,000 Units Oral Daily  . fluconazole (DIFLUCAN) IV  100 mg Intravenous Q24H  . insulin aspart  0-9 Units Subcutaneous TID WC  . lactose free nutrition  237 mL Oral TID PC & HS  . megestrol  400 mg Oral BID  . metroNIDAZOLE  500 mg Oral 3 times per day  . nystatin  5 mL Oral QID  . pantoprazole  80 mg Oral Daily  . saccharomyces boulardii  250 mg Oral BID  . sodium chloride  10-40 mL Intracatheter Q12H  . spironolactone  50 mg Oral Daily  . vancomycin  750 mg Intravenous Q24H   Continuous Infusions: . sodium chloride Stopped (05/05/14 2000)  . TPN (CLINIMIX) Adult without lytes 70 mL/hr at 05/08/14 1743   And  . fat emulsion 250 mL (05/08/14 1744)  . Marland KitchenTPN (CLINIMIX-E) Adult     And  . fat emulsion     PRN Meds:.ALPRAZolam, cyclobenzaprine, ipratropium, levalbuterol, lip balm, morphine injection, nitroGLYCERIN, sodium chloride, traMADol, triamcinolone cream   Objective:  Filed Vitals:   05/09/14 0528  BP: 137/53  Pulse: 95  Temp: 98 F (36.7 C)  Resp: 28      Intake/Output Summary (Last 24 hours) at 05/09/14 1228 Last data filed at 05/09/14 1123  Gross per 24 hour  Intake    580 ml  Output   2202 ml  Net  -1622 ml     GENERAL:moaning, eyes open, follows simple commands, anxious. SKIN: skin color, texture, turgor are dry, with venous stasis changes bilaterally.  EYES: normal, conjunctiva are pink and non-injected, sclera clear LUNGS: decreased breath sounds on the right. HEART: regular rate & rhythm and no murmurs and no lower extremity edema ABDOMEN:abdomen somewhat distended, non-tender and normal bowel sounds Musculoskeletal:no cyanosis of digits and no  clubbing. Temporal wasting noted.    CBG (last 3)   Recent Labs  05/08/14 1646 05/09/14 0734 05/09/14 1129  GLUCAP 134* 124* 119*     Labs:   Recent Labs Lab 05/05/14 0500 05/07/14 0414 05/09/14 0552  WBC 8.8 9.4 15.3*  HGB 12.5 12.7 12.5  HCT 36.7 37.9 37.2  PLT 155 163 168  MCV 93.1 93.8 94.2  MCH 31.7 31.4 31.6  MCHC 34.1 33.5 33.6  RDW 14.5 14.4 14.6  LYMPHSABS  --   --  1.9  MONOABS  --   --  1.7*  EOSABS  --   --  0.3  BASOSABS  --   --  0.0     Chemistries:    Recent Labs Lab 05/03/14 0500  05/05/14 0500 05/06/14 0800 05/07/14 0414 05/08/14 0525 05/09/14 0552  NA 136*  < > 137 130* 130* 131* 128*  K 4.3  < > 4.8 4.9 5.2 5.4* 4.9  CL 104  < > 104 100 99 101 99  CO2 24  < > 25 20 22 22 21   GLUCOSE 142*  < > 122* 187* 123* 116* 116*  BUN 26*  < > 37* 42* 43* 46* 47*  CREATININE 1.18*  < > 1.33* 1.26* 1.28* 1.32* 1.27*  CALCIUM 9.2  < > 9.9 9.7 9.9 10.1 9.7  MG 1.9  --  1.9  --   --   --  1.7  AST  --   --  35  --  57*  --  61*  ALT  --   --  19  --  26  --  31  ALKPHOS  --   --  123*  --  140*  --  136*  BILITOT  --   --  0.6  --  0.8  --  1.2  < > = values in this interval not displayed.  GFR Estimated Creatinine Clearance: 35.3 ml/min (by C-G formula based on Cr of 1.27).  Liver Function Tests:  Recent Labs Lab 05/05/14 0500 05/07/14 0414 05/09/14 0552  AST 35 57* 61*  ALT 19 26 31   ALKPHOS 123* 140* 136*  BILITOT 0.6 0.8 1.2  PROT 6.3 6.8 6.8  ALBUMIN 2.2* 2.3* 2.3*   Urine Studies     Component Value Date/Time   COLORURINE AMBER* 04/25/2014 1840   APPEARANCEUR CLOUDY* 04/25/2014 1840   LABSPEC 1.026 04/25/2014 1840   PHURINE 5.0 04/25/2014 1840   GLUCOSEU NEGATIVE 04/25/2014 1840   HGBUR NEGATIVE 04/25/2014 [place] 04/25/2014 [place] 04/25/2014 1840   PROTEINUR NEGATIVE 04/25/2014 1840   UROBILINOGEN 0.2 04/25/2014 1840   NITRITE NEGATIVE 04/25/2014 1840   LEUKOCYTESUR TRACE* 04/25/2014 1840     CBG:  Recent Labs Lab 05/08/14 0821 05/08/14 1159 05/08/14 1646 05/09/14 0734 05/09/14 1129  GLUCAP 147* 112* 134* 124* 119*       Imaging Studies:  Dg Chest 1 View  05/07/2014   CLINICAL DATA:  Status post right thoracentesis.  EXAM: CHEST - 1 VIEW  COMPARISON:  05/07/2014 at 8:27 a.m.  FINDINGS: Significant decreased pleural fluid is noted on the right. There still right lung base opacity, likely some residual fluid with atelectasis. No pneumothorax.  IMPRESSION: Significant reduction in the right pleural effusion following thoracentesis. No pneumothorax.   Electronically Signed   By: Lajean Manes M.D.   On: 05/07/2014 15:34   Dg Chest Port 1 View  05/08/2014   CLINICAL DATA:  Worsening shortness of breath.  Cough.  EXAM: PORTABLE CHEST - 1 VIEW  COMPARISON:  05/07/2014 and 05/04/2014 and chest CT dated 05/05/2014  FINDINGS: PICC line appears in good position. Heart and other mediastinal structures are shifted to the left chronically.  Right pleural effusion is essentially unchanged considering what I suspect is a slight change in the patient position. There is a chronic huge hiatal hernia. Pulmonary vascularity is normal. No acute osseous abnormality.  IMPRESSION: No significant change.  Persistent moderate right pleural effusion.   Electronically Signed   By: Rozetta Nunnery M.D.   On: 05/08/2014 17:32   US Thoracentesis Asp Pleural Space W/img Guide  05/07/2014   CLINICAL DATA:  History of hepatocellular carcinoma, cirrhosis. Recurrent right pleural effusion. Request made for therapeutic right thoracentesis.  EXAM: ULTRASOUND GUIDED RIGHT THORACENTESIS  COMPARISON:  Previous thoracentesis  PROCEDURE: An ultrasound guided thoracentesis was thoroughly discussed with the patient and questions answered. The benefits, risks, alternatives and complications were also discussed. The patient understands and wishes to proceed with the procedure. Written consent was obtained.  Ultrasound was  performed to localize and mark an adequate pocket of fluid in the right chest. The area was then prepped and draped in the normal sterile fashion. 1% Lidocaine was used for local anesthesia. Under ultrasound guidance a 19 gauge Yueh catheter was introduced. Thoracentesis was performed. The catheter was removed and a dressing applied.  Complications:  None immediate  FINDINGS: A total of approximately 1.1 L of amber colored fluid was removed. A fluid sample was notsent for laboratory analysis.  IMPRESSION: Successful ultrasound guided right thoracentesis yielding 1.1 L of pleural fluid.  Read by: Ascencion Dike PA-C   Electronically Signed   By: Maryclare Bean M.D.   On: 05/07/2014 15:24    Assessment/Plan: 80 y.o. 1. History of hepatocellular carcinoma.  [name] has chronic active hepatitis with steatosis, with advanced fibrosis.  [name] has had a radiofrequency ablation procedure. This was done in May of 2014. This was fairly successful. [name] has had no subsequent problems from this. A CT of the abdomen and pelvis was done on April 28, 2014. This showed liver lesion in the right lobe of the liver. It measured 2.5 cm. It previously measured 2.1 cm. [name] had a  moderate right pleural effusion. [name] had portal hypertension. [name] has abdominal ascites. [name] had large right hiatal hernia. [name] has some retroperitoneal lymphadenopathy. Paracentesis may be needed, but appears to be as transudate secondary to cirrhosis (NASH). At this time there was not enough fluid to drain.   2. Ascites/pleural effusion. Effusion may be related to low albumin, cirrhosis with malignant effusion less likely given negative cytology S/p right thoracentesis on 8/10 yielding 1. 1 l of pleural fluid S/p underwent rightthoracentesis with 1.2 L fluid removed. Cytology results were negative for malignancy. Fluid culture showed no growth IR consulted for consideration of a Pleurx catheter due to recurrent fluid accumulation, to possibly undergo Pleurex  catheter.  Continue Tessalon,bronchodilator treatments Pro BNP not elevated.  2-D echocardiogram done 05/05/14, EF 25-36%,UYQIH 1 diastolic dysfunction.   3. Iron deficiency anemia This has been from intermittent GI bleeding from gastrointestinal AV malformations and angiodysplasia.  4. C difficile Colitis [name] received IV fluids and flagyl. Rectal tube out as of 8/11 No diarrhea today.   5. Left lower extremity cellulitis Placed on keflex by primary team.   6. Malnutrition On TNA and Megace TNA to be weaned  7.Oral thrush   On fluconazole and nystatin swish and swallow.  Improving  8.DVT prophylaxis On SCDs   9. Full Code  Other medical issues as per admitting team     **Disclaimer: This note was dictated with voice recognition software. Similar sounding words can inadvertently be transcribed and this note may contain transcription errors which may not have been corrected upon publication of note.Sharene Butters E, PA-C 05/09/2014  12:28 PM  ADDENDUM:  I agree with the above note. I still cannot understand why [name] has this recurrent right pleural effusion. I have to believe that this is from her cirrhosis, but yet, [name] would does not have much in the way of ascites. The fluid has been tested and is negative for malignancy. It has been tested on 2 separate times.  [name] is to have a Pleurx catheter placed. I certainly agree with this. This will help with her quality of life.  [name] still is not eating much.. Dietary has seen her in and I appreciate how much they are helping. The TNA is been gradually weaned off.  I have a hard time believing that this would be from her hepatoma. This was treated with RFA. This really has not been a factor. Her last scans it showed minimal increase in size with no evidence of obvious metastatic disease.  Her nutritional state is still quite poor. Her prealbumin is only 11.8.  We will continue to try to help as much as possible.  Pete  E.  Proverb 17:17

## 2014-05-09 NOTE — Progress Notes (Signed)
PARENTERAL NUTRITION CONSULT NOTE - Follow up  Pharmacy Consult for TPN Indication: severe malnutrition in setting of hepatocellular carcinoma, chronic active hepatitis, and C.diff - associated diarrhea  Allergies  Allergen Reactions  . Avelox [Moxifloxacin Hcl In Nacl] Anaphylaxis  . Statins Other (See Comments)    Liver function elevations  . Sulfa Antibiotics Hives and Itching  . Ace Inhibitors     Unknown  . Doxycycline     Unknown  . Infed [Iron Dextran] Other (See Comments)    IV IRON, Unknown  . Infergen [Interferon [place]-1]   . Vancomycin Other (See Comments)    Flushing, erythema to neck and face, tolerated vanc at lower infusion rate  . Ciprofloxacin Other (See Comments)    Complains of sore mouth  . Fenofibrate Other (See Comments)    constipation   Patient Measurements: Height: _0  (167.6 cm) Weight: 171 lb 8 oz (77.792 kg) IBW/kg (Calculated) : 59.3 No updated weight since 8/3  Vital Signs: Temp: 98 F (36.7 C) (08/17 0528) Temp src: Oral (08/17 0528) BP: 137/53 mmHg (08/17 0528) Pulse Rate: 95 (08/17 0528) Intake/Output from previous day: 08/16 0701 - 08/17 0700 In: 700 [P.O.:340; I.V.:10; IV Piggyback:350] Out: 2203 [Urine:2200; Stool:3]  Intake/Output from this shift:    Labs:  Recent Labs  05/07/14 0414 05/09/14 0552  WBC 9.4 15.3*  HGB 12.7 12.5  HCT 37.9 37.2  PLT 163 168    Recent Labs  05/07/14 0414 05/08/14 0525 05/09/14 0552  NA 130* 131* 128*  K 5.2 5.4* 4.9  CL 99 101 99  CO2 _1 GLUCOSE 123* 116* 116*  BUN 43* 46* 47*  CREATININE 1.28* 1.32* 1.27*  CALCIUM 9.9 10.1 9.7  MG  --   --  1.7  PHOS  --   --  3.0  PROT 6.8  --  6.8  ALBUMIN 2.3*  --  2.3*  AST 57*  --  61*  ALT 26  --  31  ALKPHOS 140*  --  136*  BILITOT 0.8  --  1.2  TRIG  --   --  51  Corrected calcium: 11.1 Estimated Creatinine Clearance: 35.3 ml/min (by C-G formula based on Cr of 1.27).    Recent Labs  05/08/14 1159 05/08/14 1646  05/09/14 0734  GLUCAP 112* 134* 124*    Medications:  Scheduled:  . benzonatate  100 mg Oral BID  . cholecalciferol  1,000 Units Oral Daily  . fluconazole (DIFLUCAN) IV  100 mg Intravenous Q24H  . insulin aspart  0-9 Units Subcutaneous TID WC  . lactose free nutrition  237 mL Oral TID PC & HS  . megestrol  400 mg Oral BID  . metroNIDAZOLE  500 mg Oral 3 times per day  . nystatin  5 mL Oral QID  . pantoprazole  80 mg Oral Daily  . saccharomyces boulardii  250 mg Oral BID  . sodium chloride  10-40 mL Intracatheter Q12H  . spironolactone  50 mg Oral Daily  . vancomycin  750 mg Intravenous Q24H   Infusions:  . sodium chloride Stopped (05/05/14 2000)  . TPN (CLINIMIX) Adult without lytes 70 mL/hr at 05/08/14 1743   And  . fat emulsion 250 mL (05/08/14 1744)    Assessment: 89 yoF with hepatocellular carcinoma and chronic active hepatitis admitted 8/3 after LOC at home associated with recent nausea and loose stools. Was previously admitted 7/26 for cellulitis; received Rocephin x 1 dose, vancomycin x 3 doses, discharged on po  Keflex. Stool tested positive for C.diff 8/4. Poor po intake PTA, recent 10 lb weight loss noted. Orders were received from oncology to begin TPN 8/8 for malnutrition.  Pharmacy dosing assistance was requested.   Nutritional Goals:  RD recs 8/5: 75-85 grams of protein and 1550-1750 kCal per day. Clinimix E 5/15 at a goal rate of 13m/hr + 20% fat emulsion at 162mhr provides 84g/day protein, 1672 Kcal/day.   Significant events: 8/11: TPN advanced to goal. Flexiseal dc'd. Megace started for appetite stimulation. 8/12: Diflucan started for thrush.  8/12: R thoracentesis for drainage of pleural effusion 8/13: Planning paracentesis today 8/14: Paracentesis canceled 8/16: changed to no-electrolyte TNA formula, K elevated  8/16: D#10 TPN TPN Access:  PICC (placed 8/8)  Current Nutrition:  Clinimix E 5/15 at 7070mr + 20% fat emulsion at 67m40m Carb  modified diet Boost Plus t.i.d.  IVF: NS at 10 mL/hr only when administering IV medications  Insulin Requirements in the past 24 hours:  2 units sliding scale Novolog; on sensitive-scale SSI TID   Labs:  Electrolytes - Na slightly low; unable to adjust Na content of premixed Clinimix-E.   K= 4.9 today,  Other lytes WNL.  Renal: SCr elevated,stable 1.27  ; BUN elevated, stable 47 today  Hepatic : Alk Phos up but < 1.5 x ULN.  AST 61, Other LFTs below ULN.  CBGs - 112-134, all < 150  TGs - WNL, last checked 8/16  Prealbumin - 6.1 (8/10), pending today  Dietary intake:683 kcal (44% of minimum estimated needs)                           33g protein (44% of minimum estimated needs) Stools are less frequent and reportedly are formed now.    Plan:   Reduce TNA rate to 40 ml/hr  Change back to Clinimix E 5/15 with lytes low or wnl  Continue 20% fat emulsion at 67ml18m  TPN to contain standard multivitamins and trace elements.  Cont carbohydrate-modified diet.  Follow CBGs    Routine TNA lab panel on Mondays & Thursdays.  BMET in am  GreenMinda DittomD Pager [phone], 11:29 AM

## 2014-05-09 NOTE — Progress Notes (Addendum)
Pt returned from getting a pleurex to her Rt. Side. Dressing dry/intact. Pt medicated for pain, daughter at bedside. Golden asked to get VS q 30 min x3

## 2014-05-09 NOTE — Progress Notes (Signed)
Progress Note   [name] [id number] DOB: 1928/08/16 DOA: 04/25/2014 PCP: Alesia Richards, MD   Brief Narrative:   [name] is an 80 y.o. female with a PMH of hypertension, prior history of MI, TIA, hepatoma who presented to [facility] ED 04/25/2014 status post fall at home. Pt also reported nausea and vomiting and having loose bowel movements. On admission, x-ray studies were done of the lumbar spine and pelvis but did not reveal acute fractures. CT head and CT cervical spine did not show acute intracranial findings. Initial chest x-ray showed no acute cardiopulmonary process. Blood work revealed mild hyponatremia, sodium 136.  Creatinine was 1.45 and calcium 10.6. Urinalysis showed only trace leukocytes. Hospital course has been complicated by the development of C.diff diarrhea (in the setting of recent antibiotic use for treatment of cellulitis) and a recurrent right-sided pleural effusion s/p 4 ultrasound guided thoracentesis since the admission with IR consulting for consideration of PleurX catheter. Her abdomen continues to be distended but based on abdominal US there is not enough ascites for drainage. Pt requires TNA for nutritional support. Plan is to wean her off TNA and hopefully discharge her to [facility] per family preference.   Assessment/Plan:   Principal Problem:  Acute respiratory failure with hypoxia / Persistent right pleural effusion   Initial CXR did not show acute cardiopulmonary findings.  CT abdomen done 04/28/2014 to evaluate abdominal distention, which incidentally showed a moderate right pleural effusion.   On 04/29/2014, pt felt more short of breath so a CXR was done which showed large right pleural effusion. Pt received lasix 20 mg IV and then underwent US guided thoracentesis with 1.2 L fluid removed. Cytology results were negative for malignancy. Fluid culture showed no growth.   The patient has had a total of 4 thoracenteses since admission, and IR  consulted for consideration of a Pleurx catheter.  Patient currently agreeable to have catheter placed, discussed with IR PA, Corrine.   Continue Tessalon capsules for cough twice daily. Continue bronchodilator treatments, Xopenex and Atrovent as needed for shortness of breath or wheezing.  Pro BNP not elevated.  2-D echocardiogram done 05/05/14, EF 65-70% with grade 1 diastolic dysfunction.  Effusion may be related to hypoalbuminemic state, cirrhosis with malignant effusion less likely given negative cytology.  Active Problems:  Recurrent left lower extremity cellulitis  Unfortunately, needs further antibiotics but challenging with history of multiple antibiotic allergies.  Continue Vancomycin (adverse reaction only) and run infusion in at low rate.  Wound care RN evaluation performed 05/08/14. Continue local wound care per recommendations.  Abdominal ascites with history of hepatoma and cirrhosis secondary to NASH   Paracentesis attempted 3 times and per IR not significant amount of ascites seen.  The patient is status post radiofrequency ablation of hepatoma and typically takes spironolactone (hold secondary to hyperkalemia).  Fall, generalized weakness   Likely due to dehydration. Per PT evaluation, pt requires SNF on discharge and she and her family are agreeable to SNF placement.   Ongoing shortness of breath precludes progression of activity.  Oral thrush   Continue fluconazole and nystatin swish and swallow. Oral thrush is improving.  Hypertension   Blood pressure at goal. All antihypertensives on hold.  C.diff enteritis /Abdominal distention   Continue Flagyl. No further reports of diarrhea.  Acute renal failure   Likely secondary to dehydration and diuretic therapy.  Baseline creatinine 0.9-1.0.  Hypokalemia / hyperkalemia   Likely due to GI losses and lasix. Became hyperkalemic  05/08/14 with a potassium of 5.4.  Potassium down to 4.9 today, okay to resume  spironolactone if potassium less than 5.  Hypercalcemia   Likely due to dehydration. Resolved with IV fluids.  Severe protein calorie malnutrition   Nutrition consulted   Severely low prealbumin.   Wean TNA soon. Continue megestrol 400 mg PO BID.  DVT Prophylaxis   SCD's due to initial mild thrombocytopenia. Platelet count normalized as of 05/05/2014.  Code Status: Full. Family Communication: Daughter [name] updated at bedside. Disposition Plan: SNF when stable.   IV Access:    PICC placed 04/30/14.   Procedures and diagnostic studies:    Dg Chest 2 View 04/25/2014 IMPRESSION: No acute cardiopulmonary process. Cardiomegaly with mild central vascular congestion reidentified.   Dg Lumbar Spine Complete 04/25/2014 IMPRESSION: No acute osseous abnormality of the lumbar spine   Dg Pelvis 1-2 Views8/11/2013 IMPRESSION: Negative.   Ct Head Wo Contrast 04/25/2014 IMPRESSION: CT head: No acute intracranial process. Normal noncontrast CT of the head for age. CT cervical spine: Straightened cervical lordosis without acute fracture. Grade 1 C5-6 anterolisthesis on degenerative basis.   Ct Cervical Spine Wo Contrast 04/25/2014 IMPRESSION: CT head: No acute intracranial process. Normal noncontrast CT of the head for age. CT cervical spine: Straightened cervical lordosis without acute fracture. Grade 1 C5-6 anterolisthesis on degenerative basis.   Dg Chest [facility] 04/25/2014 IMPRESSION: Retrocardiac opacity which could represent atelectasis given the degree of hypoaeration although pneumonia could appear similar. If symptoms persist, consider PA and lateral chest radiographs obtained at full inspiration when the patient is clinically able.   CT abdomen 04/28/2014 Morphologic features of the liver compatible with cirrhosis with stigmata of portal venous hypertension. 2. Increase in abdominal ascites. 3. Hepatoma site within the segment 6 of the liver is slightly increased in size from previous exam. 4.  Large right pleural effusion. 5. Large hiatal hernia 6. Atherosclerotic disease 7. Prominent retroperitoneal adenopathy is nonspecific in the setting of cirrhosis and portal venous hypertension. This appears similar to previous exam. 8. Wall thickening of the colon is also nonspecific in the setting of portal venous hypertension. This may be due to a hypoproteinemic state or colitis.   Dg Chest 04/29/2014 Marked improvement RIGHT pleural effusion. No pneumothorax.   Dg Chest [facility] 04/29/2014 Significant increase in a right-sided pleural effusion when compared with the prior exam. Hiatal hernia.   US Thoracentesis Asp Pleural Space W/img Guide 04/29/2014 Successful ultrasound guided diagnostic and therapeutic right thoracentesis yielding 1.2 liters of pleural fluid.   TPN 04/30/2014   Dg Chest Port 05/01/2014 1.Interval placement of right upper extremity PICC with tip terminating in the proximal superior vena cava. 2. Interval accumulation of a moderate to large right-sided pleural effusion. There is also a small left pleural effusion. 3. Bibasilar atelectasis and/or consolidation.   US Thoracentesis Asp Pleural Space W/img Guide 05/02/2014 Successful ultrasound guided diagnostic and therapeutic right thoracentesis yielding 1.1 liters of pleural fluid.   Dg Chest 05/02/2014 Resolution of right pleural effusion, without pneumothorax. Bibasilar airspace disease which could represent atelectasis or infection.   US Abdomen 05/02/2014 No significant amount of ascites to perform a therapeutic paracentesis.   DG abdomen 05/03/2014 - no bowel obstruction, small bilateral pleural effusion, on right seems to be resolving; possible interstitial edema   US Thoracentesis Pleural Space 05/04/2014 Successful ultrasound guided therapeutic right thoracentesis yielding 900 cc's of pleural fluid.   CT chest w/o contrast 05/05/2014 - moderate right pleural effusion, no frank interstitial edema; cirrhosis  DG chest 05/05/2014 -  pleural effusions and basilar atelectasis larger on left. Hiatal hernia.   2-D echocardiogram 05/05/14: EF 65-70%. No regional wall motion abnormalities. Grade 1 diastolic dysfunction.  US Abdomen Limited 05/06/2014 No significant amount of ascites to perform paracentesis.   Dg Chest Port 1 View 05/07/2014 Worsening RIGHT pleural effusion.   Dg Chest Port 1 View 05/08/14: No significant change.    Medical Consultants:    Dr. Burney Gauze, Oncology.  Interventional radiology   Other Consultants:    Dietitian  Physical therapy  Wound care RN   Anti-Infectives:    Flagyl 04/26/14--->  Fluconazole 05/04/14--->  Vancomycin 05/08/14--->  Subjective:   [name] is hungry today, but is n.p.o. for possible placement of a Pleurx catheter. She is more short winded and her cough is worsening, which is likely due to her underlying reaccumulating right pleural effusion. Seems anxious.  Objective:    Filed Vitals:   05/08/14 0450 05/08/14 1433 05/08/14 2032 05/09/14 0528  BP: 135/56 137/59 134/56 137/53  Pulse: 97 96 103 95  Temp: 98 F (36.7 C) 98.2 F (36.8 C) 98.1 F (36.7 C) 98 F (36.7 C)  TempSrc: Oral Oral Oral Oral  Resp: 22 20 24 28   Height:      Weight:      SpO2: 92% 95% 95% 97%    Intake/Output Summary (Last 24 hours) at 05/09/14 0735 Last data filed at 05/09/14 0530  Gross per 24 hour  Intake    700 ml  Output   2203 ml  Net  -1503 ml    Exam: Gen:  NAD, weak Cardiovascular:  RRR, No M/R/G Respiratory:  Lungs diminished right base Gastrointestinal:  Abdomen softly distended, NT, + BS Extremities:  Venous stasis dermatitis changes BLE, with erythema/ulcer to LLE as pictured below, image captured 05/08/14 , no significant changes:         Data Reviewed:    Labs: Basic Metabolic Panel:  Recent Labs Lab 05/03/14 0500  05/05/14 0500 05/06/14 0800 05/07/14 0414 05/08/14 0525 05/09/14 0552  NA 136*  < > 137 130* 130* 131* 128*  K  4.3  < > 4.8 4.9 5.2 5.4* 4.9  CL 104  < > 104 100 99 101 99  CO2 24  < > 25 20 22 22 21   GLUCOSE 142*  < > 122* 187* 123* 116* 116*  BUN 26*  < > 37* 42* 43* 46* 47*  CREATININE 1.18*  < > 1.33* 1.26* 1.28* 1.32* 1.27*  CALCIUM 9.2  < > 9.9 9.7 9.9 10.1 9.7  MG 1.9  --  1.9  --   --   --  1.7  PHOS 2.9  --  3.7  --   --   --  3.0  < > = values in this interval not displayed. GFR Estimated Creatinine Clearance: 35.3 ml/min (by C-G formula based on Cr of 1.27). Liver Function Tests:  Recent Labs Lab 05/05/14 0500 05/07/14 0414 05/09/14 0552  AST 35 57* 61*  ALT 19 26 31   ALKPHOS 123* 140* 136*  BILITOT 0.6 0.8 1.2  PROT 6.3 6.8 6.8  ALBUMIN 2.2* 2.3* 2.3*   CBC:  Recent Labs Lab 05/05/14 0500 05/07/14 0414 05/09/14 0552  WBC 8.8 9.4 15.3*  NEUTROABS  --   --  11.4*  HGB 12.5 12.7 12.5  HCT 36.7 37.9 37.2  MCV 93.1 93.8 94.2  PLT 155 163 168   BNP (last 3 results)  Recent Labs  05/08/14 0525  PROBNP 71.3   CBG:  Recent Labs Lab 05/07/14 1225 05/07/14 1639 05/08/14 0821 05/08/14 1159 05/08/14 1646  GLUCAP 116* 160* 147* 112* 134*    Microbiology Recent Results (from the past 240 hour(s))  BODY FLUID CULTURE     Status: None   Collection Time    04/29/14  2:33 PM      Result Value Ref Range Status   Specimen Description FLUID RIGHT PLEURAL EFFUSION   Final   Special Requests Normal   Final   Gram Stain     Final   Value: NO WBC SEEN     NO ORGANISMS SEEN     Performed at [facility]   Culture     Final   Value: NO GROWTH 3 DAYS     Performed at [facility]   Report Status 05/02/2014 FINAL   Final  URINE CULTURE     Status: None   Collection Time    05/03/14 11:50 AM      Result Value Ref Range Status   Specimen Description URINE, CATHETERIZED   Final   Special Requests flagyl Immunocompromised   Final   Culture  Setup Time     Final   Value: 05/03/2014 14:36     Performed at [facility]     Final    Value: NO GROWTH     Performed at [facility]   Culture     Final   Value: NO GROWTH     Performed at [facility]   Report Status 05/04/2014 FINAL   Final     Medications:   . benzonatate  100 mg Oral BID  . cholecalciferol  1,000 Units Oral Daily  . fluconazole (DIFLUCAN) IV  100 mg Intravenous Q24H  . insulin aspart  0-9 Units Subcutaneous TID WC  . lactose free nutrition  237 mL Oral TID PC & HS  . megestrol  400 mg Oral BID  . metroNIDAZOLE  500 mg Oral 3 times per day  . nystatin  5 mL Oral QID  . pantoprazole  80 mg Oral Daily  . saccharomyces boulardii  250 mg Oral BID  . sodium chloride  10-40 mL Intracatheter Q12H  . vancomycin  750 mg Intravenous Q24H   Continuous Infusions: . sodium chloride Stopped (05/05/14 2000)  . TPN (CLINIMIX) Adult without lytes 70 mL/hr at 05/08/14 1743   And  . fat emulsion 250 mL (05/08/14 1744)    Time spent: 35 minutes with > 50% of time discussing current diagnostic test results, clinical impression and plan of care.    LOS: 14 days   [facility] Pager [phone]. If unable to reach me by pager, please call my cell phone at [phone].  *Please refer to [email or web address], password [email or web address] to get updated schedule on who will round on this patient, as hospitalists switch teams weekly. If 7PM-7AM, please contact night-coverage at [email or web address], password [email or web address] for any overnight needs.  05/09/2014, 7:35 AM    **Disclaimer: This note was dictated with voice recognition software. Similar sounding words can inadvertently be transcribed and this note may contain transcription errors which may not have been corrected upon publication of note.**  Information printed out and reviewed with the patient/family:     In an effort to keep you and your family informed about your hospital stay, I am providing you with this information sheet. If you or your family have any questions,  please do not hesitate to have the  nursing staff page me to set up a meeting time.  Also note that the hospitalist doctors typically change on Tuesdays or Wednesdays to a different hospitalist doctor.  [name] 05/09/2014 14 (Number of days in the hospital)  Treatment team:  Dr. Jacquelynn Cree, Hospitalist (Internist)  Dr. Burney Gauze, Oncologist  Pertinent labs / studies:  A chest x-ray done 05/08/14 showed no significant changes. Moderate right-sided fluid collection in the lung.  Your white blood cell count (cells that fight infection) are a little high today at 15.3.  Your hemoglobin is stable at 12.5.  Your platelets are stable at 168.   Principle Diagnosis: Recurrent right-sided pleural effusion with respiratory difficulty, left lower extremity skin infection, Clostridium difficile diarrhea, malnourishment.  Plan for today:  Decide on whether or not to place Pleurx catheter.  Continue supportive care with nutritional support, antibiotics to treat cellulitis, Flagyl to treat the Clostridium difficile infection.  Anticipated discharge date: Depends on progress.

## 2014-05-09 NOTE — Progress Notes (Signed)
Calorie Count Note  48 hour calorie count ordered, ended evening of 8/15  Diet: CHO modified  Supplements: Boost Plus TID   8/14:  Breakfast: 70 calories, 6g protein  Lunch: 50 calories, 6g protein  Dinner: 150 calories, 11g protein  Supplements: 360 calories, 14g protein  8/15: Breakfast: 124 calories, 5g protein Supplements: 612 calories, 24g protein   Average daily intake: 683 kcal (44% of minimum estimated needs)  33g protein (44% of minimum estimated needs)  Nutrition Dx: Inadequate oral intake related to weakness/loose stools as evidenced by PO intake < 75% for one month, 5% body weight loss in one month - ongoing   Goal: Pt to meet >/= 90% of their estimated nutrition needs - met with TPN   TPN: Receiving Clinimix E 5/15 at a goal rate of 38ml/hr + 20% fat emulsion at 35ml/hr provides 84g/day protein, 1672 Kcal/day - meeting 100% of estimated nutritional needs   Daughter reports pt ate only bites of food yesterday. Had 1.1L fluid removed from right thoracentesis 8/15. Currently NPO for possible pleurx catheter. Has been getting Megace since 8/12. Pt asleep in bed however daughter reports earlier pt c/o being hungry. BMs documented yesterday to be formed.   Intervention: - Diet advancement per MD - Recommend changing nutrition support from TPN to TF as pt appears to be tolerating PO diet if intake continues to be poor  - RD to continue to monitor    Carlis Stable MS, [place], [place] Pager [phone] Weekend/After Hours Pager

## 2014-05-09 NOTE — Progress Notes (Signed)
CSW continuing to follow for disposition planning.  Per MD, pt not yet medically ready for discharge as pt continuing to wean off of TNA and pt have Pleur-X-Cath placement today.  CSW updated [facility] who states that they will have a bed available when pt medically ready for discharge.  CSW received notification from RN that pt daughter requesting to speak with CSW.  CSW met with pt and pt daughter. Pt daughter discussed that pt son is attempting to apply for FMLA, but needing a consent per Dr. Antonieta Pert office. CSW contacted Dr. Antonieta Pert office with pt permission and Dr. Antonieta Pert office stated that pt son, Laverna Peace was not on pt release of information paperwork and pt would have to sign a consent for Dr. Antonieta Pert office to release information to pt son. Dr. Antonieta Pert office faxed needed consent to this CSW. CSW met with pt and pt daughter and had needed consent signed and faxed back to Dr. Antonieta Pert office for consent for Dr. Antonieta Pert office to release needed information for pt son regarding FMLA paperwork.  CSW updated pt and pt daughter that CSW is keeping [facility] update about pt progress and facility is stating that they will have room available when pt medically ready for discharge.  CSW to continue to follow.  [name], MSW, [place] Work [phone]

## 2014-05-09 NOTE — Telephone Encounter (Signed)
Signed order form for PleurX drainage kits faxed to [place]. dph

## 2014-05-10 LAB — BASIC METABOLIC PANEL
Anion gap: 8 (ref 5–15)
BUN: 48 mg/dL — ABNORMAL HIGH (ref 6–23)
CHLORIDE: 100 meq/L (ref 96–112)
CO2: 20 mEq/L (ref 19–32)
CREATININE: 1.24 mg/dL — AB (ref 0.50–1.10)
Calcium: 9.3 mg/dL (ref 8.4–10.5)
GFR, EST AFRICAN AMERICAN: 45 mL/min — AB (ref >90)
GFR, EST NON AFRICAN AMERICAN: 39 mL/min — AB (ref >90)
Glucose, Bld: 108 mg/dL — ABNORMAL HIGH (ref 70–99)
Potassium: 5.2 mEq/L (ref 3.7–5.3)
Sodium: 128 mEq/L — ABNORMAL LOW (ref 137–147)

## 2014-05-10 LAB — GLUCOSE, CAPILLARY
GLUCOSE-CAPILLARY: 121 mg/dL — AB (ref 70–99)
Glucose-Capillary: 103 mg/dL — ABNORMAL HIGH (ref 70–99)
Glucose-Capillary: 116 mg/dL — ABNORMAL HIGH (ref 70–99)

## 2014-05-10 MED ORDER — ADULT MULTIVITAMIN W/MINERALS CH
1.0000 | ORAL_TABLET | Freq: Every day | ORAL | Status: DC
  Administered 2014-05-11: 1 via ORAL
  Filled 2014-05-10: qty 1

## 2014-05-10 MED ORDER — BENZONATATE 100 MG PO CAPS
100.0000 mg | ORAL_CAPSULE | Freq: Two times a day (BID) | ORAL | Status: DC | PRN
  Administered 2014-05-10: 100 mg via ORAL

## 2014-05-10 NOTE — Progress Notes (Signed)
Occupational Therapy Treatment Patient Details Name: [name] MRN: [id number] DOB: Feb 06, 1928 Today's Date: 05/10/2014    History of present illness 80 yo female admitted with fall, diarrhea, LE cellulitis. Hx of HTN, cirrhosis. Pt lives alone in [place].    OT comments  Pt limited by cough, pain and overall feeling tired/weak. She is motivated and did participate in ADL with attempting to don depends over LEs and stand to pull them up. This task was fatiguing and pt needing to rest afterwards. Pt had worked with PT prior to OT arriving. Will benefit from continued OT services to progress strength and independence with ADL.   Follow Up Recommendations  SNF;Supervision/Assistance - 24 hour    Equipment Recommendations  Other (comment) (next venue)    Recommendations for Other Services      Precautions / Restrictions Precautions Precautions: Fall Precaution Comments: bowel frequency/incontinence Restrictions Weight Bearing Restrictions: No       Mobility Bed Mobility                  Transfers Overall transfer level: Needs assistance Equipment used: Rolling walker (2 wheeled) Transfers: Sit to/from Stand Sit to Stand: Min assist;Mod assist         General transfer comment: assist to rise and steady. Pt leaning posteriorly in standing.    Balance                                   ADL                       Lower Body Dressing: Total assistance;Sit to/from stand                 General ADL Comments: Pt up in chair when OT arrived. She wanted to don Depends so assisted pt with task. She was unable to thread LEs through openings due to discomfort in R side and also weakness. Started Depends over her LEs and stood to practice pulling them up. Pt unable to reach down to knees to start pulling up Depends so OT pulled them to thighs for her. Pt attempted to pull them up over hips but was very unsteady and leaning posteriorly.  Required total assist pt 15%. Pt sat back down in chair and was noticeable SOB but sats on RA 95%. Encouraged PLB and rest. Informed nursing of pain in R side area. Stood from chair with min/mod assist. Educated on reacher option to help slide clothing over LEs and pt verbalized understanding of concept. She states she has a Secondary school teacher at home.      Vision                     Perception     Praxis      Cognition   Behavior During Therapy: WFL for tasks assessed/performed Overall Cognitive Status: Within Functional Limits for tasks assessed                       Extremity/Trunk Assessment               Exercises     Shoulder Instructions       General Comments      Pertinent Vitals/ Pain       Pain Assessment: 0-10 Pain Score: 7  Pain Location: R flank/rib area. Pain Descriptors / Indicators: Aching Pain Intervention(s): Patient  requesting pain meds-RN notified  Home Living                                          Prior Functioning/Environment              Frequency Min 2X/week     Progress Toward Goals  OT Goals(current goals can now be found in the care plan section)  Progress towards OT goals: Not progressing toward goals - comment (feels weak, pain, cough)     Plan Discharge plan remains appropriate    Co-evaluation                 End of Session Equipment Utilized During Treatment: Rolling walker   Activity Tolerance Patient limited by fatigue;Patient limited by pain   Patient Left in chair;with call bell/phone within reach;with family/visitor present   Nurse Communication          Time: 0626-9485 OT Time Calculation (min): 25 min  Charges: OT General Charges $OT Visit: 1 Procedure OT Treatments $Self Care/Home Management : 8-22 mins $Therapeutic Activity: 8-22 mins  [name] [phone] 05/10/2014, 12:49 PM

## 2014-05-10 NOTE — Progress Notes (Signed)
Interventional Radiology Progress Note  80 yo female POD#1 s/p placement of a right -sided tunneled Pleur-X catheter.   Pt awake and interactive.  Feeling considerably better currently.  Still has some mild pain in the right chest wall.  Breathing is much better and coughing has resolved.    Tube insertion site looks good.  Bandage C/D/I.  Appropriately TTP.   A/P:  Improving POD#1 s/p placement of a right -sided tunneled Pleur-X catheter.   - Pain will continue to improve day by day, I think she has made it through the worst of the discomfort - Recommend draining 1 bottle daily to prevent symptomatic re-accumulation of pleural fluid   Signed,  Criselda Peaches, MD [facility] Radiology

## 2014-05-10 NOTE — Progress Notes (Signed)
CSW continuing to follow for disposition planning.  CSW updated [facility] via telephone and sent updated clinicals to facility.  CSW received notification from [facility] that pt received bed offer from [facility] and pt daughter is [facility] this afternoon and plans to discuss with pt family and MD following tour.   CSW will continue to follow to assist with disposition planning. Pt family determining choice between [facility] and [place], MSW, [place] Work [phone]

## 2014-05-10 NOTE — Progress Notes (Signed)
NUTRITION FOLLOW UP  Intervention:   - RD to add snacks per pt preference - Consider initiation of TF/TF access placement; as warranted recommend Osmolite 1.2 at 60 ml/hr for 12 hours nocturnal feeds to provide 864 kcal (55% est kcal needs), and 40 gram protein (53% est kcal needs) - Continue TPN wean - Continue with Megace 400 mg and thrush medications for assistance with improved appetite and taste  Nutrition Dx:   Inadequate oral intake related to weakness/loose stools as evidenced by PO intake < 75% for one month, 5% body weight loss in one month.   Goal:  Pt to meet >/= 90% of their estimated nutrition needs   Monitor:  Total protein/energy intake, GI profile, labs, weight, glucose profile  Assessment:   [name] is a 80 y.o. female with prior h/o hypertension, diet controlled DM, recently admitted for cellulitis of the left leg and discharged on the 7/29, brought in by her daughter after she found her on the bathroom floor. As per the patient, she felt Weak, slightly went to the bathroom, and the next thing she knew she was on the floor. She also reports loose bowel Movements since 2 days, associated with nausea, no vomiting  Pt assessed by RD (8/5) who determine pt meet criteria for severe malnutrition.    Pt has had ongoing bouts of diarrhea- rectal tube remains in place.  She has been eating 25-65% of meals and supplementing with Boost twice daily.  She is keeping some foods as snacks, but her intake overall remains inadequate.   She has also started TPN.   Pt is ordered Creon. RD does not see associated diagnosis.   8/11: -Continues with decreased appetite. Pt consuming 25% of breakfast, and approximately 50% of dinner meals.  -Is receiving Boost QID. Pt reported consuming approximately 50% of each supplement -Pt started on Megace 40 mg on 8/11; consider increasing to 400 mg dosage for more therapeutic appetite stimulant effects -Encouraged PO intake, offered additional  snacks or supplements, which pt declined. Pt eager to begin moving and participating in PT/OT as she believes this will also help improve appetite -Receiving Clinimix E5/15 at 60 mlhr, with plan to advance to goal of 70 ml/hr. This would provide 1670 kcal (100% est kcal needs), 84 gram protein (100% est protein needs) -Flexiseal to be removed today 8/11 per MD notes -Phos/K/Mg WNL -CBGs < 150 mg/dl, one episode > 200 -Prealbumin low but trending up. Pre-Albumin is strongly affected by stress response and inflammatory process, therefore, do not expect to see an improvement in this lab value during acute hospitalization.  05/28/2023: -Pt continues with deceased appetite. Consuming 10-25% PO intake. Noted that family has been encouraging pt to consume high protein foods- eggs, peanut butter.  -Has developed thrush, which also inhibits PO intake and decreases appetite. Receiving diflucan. Reviewed nutrition therapy for thrush to assist with tolerance -Medications reviewed; megace dosage has been increased to 400 mg BID suspension -Continues to consume Boost. Per discussion with RN, pt consumes 75% of at least 3 supplements daily. Would possibly benefit from modifying Boost to be available in-between meals to encouraged PO of solids/meal food items -RN reported pt's loose stool improving, was able to have soft, semi-formed stool today. Would benefit from probiotic for gut health -Continues with TPN. Receiving Clinimix E 5/15 at a goal rate of 83ml/hr + 20% fat emulsion at 34ml/hr provides 84g/day protein, 1672 Kcal/day.  8/17: -Calorie count indicated pt consuming approximately 44% est kcal and protein  needs. Family provided RD list with pt's preferred snacks  8/18: -Pt s/p pleurx catheter placement. -Per discussion with pharmacy, pt to continue with TPN wean and d/c tonight at 6PM. -Pt's PO intake minimal. Per discussion with NT, pt declined to order breakfast as she tired from restless night. Pt ordered  milk, yogurt, fruit for lunch and had minimal intake, <25% PO intake. Drinking 50% of Boost Plus, has been consuming two to three supplements daily. -Provided pt with graham crackers and peanut butter snack. Will order snacks BID in [employer] per prepared list as pt no longer with NPO status.  -Pt with thrush, noted this inhibits taste and flavors of foods. Encouraged soft, cold foods and to continue with oral medication regimen.  -Pt's C.diff loose stools improving, consider TF access and initiation of TF for nutrient replenishment as pt tolerating PO intake -As warranted; recommend Osmolite 1.2 at 60 ml/hr for 12 hours nocturnal feeds to provide 864 kcal (55% est kcal needs), and 40 gram protein (53% est kcal needs)   Height: Ht Readings from Last 1 Encounters:  04/25/14 5\' 6"  (1.676 m)    Weight Status:   Wt Readings from Last 1 Encounters:  04/25/14 171 lb 8 oz (77.792 kg)    Re-estimated needs:  Kcal: 1550-1750  Protein: 75-85 gram  Fluid: >/=1600 ml/daily   Skin: non-pitting LE edema, otherwise intact  Diet Order: General   Intake/Output Summary (Last 24 hours) at 05/10/14 1505 Last data filed at 05/10/14 1212  Gross per 24 hour  Intake    625 ml  Output   1600 ml  Net   -975 ml    Last BM: 8/16   Labs:   Recent Labs Lab 05/05/14 0500  05/08/14 0525 05/09/14 0552 05/10/14 0500  NA 137  < > 131* 128* 128*  K 4.8  < > 5.4* 4.9 5.2  CL 104  < > 101 99 100  CO2 25  < > 22 21 20   BUN 37*  < > 46* 47* 48*  CREATININE 1.33*  < > 1.32* 1.27* 1.24*  CALCIUM 9.9  < > 10.1 9.7 9.3  MG 1.9  --   --  1.7  --   PHOS 3.7  --   --  3.0  --   GLUCOSE 122*  < > 116* 116* 108*  < > = values in this interval not displayed.  CBG (last 3)   Recent Labs  05/09/14 1129 05/10/14 0759 05/10/14 1208  GLUCAP 119* 103* 116*    Scheduled Meds: . cholecalciferol  1,000 Units Oral Daily  . lactose free nutrition  237 mL Oral TID PC & HS  . megestrol  400 mg Oral BID   . metroNIDAZOLE  500 mg Oral 3 times per day  . [START ON 05/11/2014] multivitamin with minerals  1 tablet Oral Daily  . nystatin  5 mL Oral QID  . pantoprazole  80 mg Oral Daily  . saccharomyces boulardii  250 mg Oral BID  . sodium chloride  10-40 mL Intracatheter Q12H  . vancomycin  750 mg Intravenous Q24H    Continuous Infusions: . sodium chloride Stopped (05/05/14 2000)   Atlee Abide Dickey [place] Clinical Dietitian [phone]

## 2014-05-10 NOTE — Progress Notes (Signed)
Mrs. [name] now has a Pleurx catheter in the right pleural space. I am still not sure as to what time she chooses to have the right pleural effusion.  She still is not eating all that much. She still is on TNA.  This is a really hard to see a lot of improvement with her. Again, I am not sure as to why she is having a really hard time trying to recover.  She is not able to do much in the way of physical therapy. They have been trying to work with her.  She has had no fever. She's had no chills. There has been no bleeding.  Her vital signs are stable. Blood pressure is 133/50. Temperature is 97.9. Pulse is 86. Her lungs sound clear on the left side. Right side is somewhat decreased. Cardiac exam regular rate and rhythm. She has no murmurs. Abdomen is slightly distended. She has decent bowel sounds. There is no obvious fluid. There is no palpable liver or spleen tip. Extremities shows some stasis-type dermatitis changes in the lower extremities. His heart is say the same type of cellulitis in the left lower leg and foot. Again this may be some stasis dermatitis type changes. Neurological exam is non-focal.  I am just very disappointed that she is not making more progress. I just not sure what else we can do to try to help with her strength. I think it is all about nutrition. Her last prealbumin was about 12.. This is incredibly low.   I appreciate everybody's help with her.

## 2014-05-10 NOTE — Progress Notes (Signed)
Progress Note   KAFI DOTTER PPJ:[id number] DOB: [birth year] DOA: 04/25/2014 PCP: Alesia Richards, MD   Brief Narrative:   [name] is an 80 y.o. female with a PMH of hypertension, prior history of MI, TIA, hepatoma who presented to [facility] ED 04/25/2014 status post fall at home. Pt also reported nausea and vomiting and having loose bowel movements. On admission, x-ray studies were done of the lumbar spine and pelvis but did not reveal acute fractures. CT head and CT cervical spine did not show acute intracranial findings. Initial chest x-ray showed no acute cardiopulmonary process. Blood work revealed mild hyponatremia, sodium 136.  Creatinine was 1.45 and calcium 10.6. Urinalysis showed only trace leukocytes. Hospital course has been complicated by the development of C.diff diarrhea (in the setting of recent antibiotic use for treatment of cellulitis) and a recurrent right-sided pleural effusion s/p 4 ultrasound guided thoracentesis since the admission with IR consulting for consideration of PleurX catheter. Her abdomen continues to be distended but based on abdominal US there is not enough ascites for drainage. Pt requires TNA for nutritional support. Plan is to wean her off TNA and hopefully discharge her to [facility] per family preference.   Assessment/Plan:   Principal Problem:  Acute respiratory failure with hypoxia / Persistent right pleural effusion   Initial CXR did not show acute cardiopulmonary findings. Subsequent CT scan demonstrated a right pleural effusion.  The patient has had a total of 4 thoracenteses since admission (cytology/cultures negative), and IR consulted for consideration of a Pleurx catheter, which was placed 05/09/14, which will be drained daily.    Continue Tessalon capsules for cough twice daily. Continue bronchodilator treatments, Xopenex and Atrovent as needed for shortness of breath or wheezing.  Pro BNP not elevated.  2-D echocardiogram done 05/05/14, EF  65-70% with grade 1 diastolic dysfunction.  Effusion may be related to hypoalbuminemic state and cirrhosis with malignant effusion less likely given negative cytology.  Active Problems:  Recurrent left lower extremity cellulitis  Unfortunately, needs further antibiotics but challenging with history of multiple antibiotic allergies.  Continue Vancomycin (adverse reaction only) and run infusion in at low rate.  Wound care RN evaluation performed 05/08/14. Continue local wound care per recommendations.  Abdominal ascites with history of hepatoma and cirrhosis secondary to NASH   Paracentesis attempted 3 times and per IR not significant amount of ascites seen.  The patient is status post radiofrequency ablation of hepatoma and typically takes spironolactone (hold secondary to hyperkalemia).  Fall, generalized weakness   Likely due to dehydration. Per PT evaluation, pt requires SNF on discharge and she and her family are agreeable to SNF placement.   Ongoing shortness of breath has precluded progression of activity.  Hopefully can progress now that Pleurx in.   Oral thrush   Continue fluconazole and nystatin swish and swallow. Oral thrush is improving.  Hypertension   Blood pressure at goal. All antihypertensives on hold.  C.diff enteritis /Abdominal distention   Continue Flagyl. No further reports of diarrhea.  Acute renal failure   Likely secondary to dehydration and diuretic therapy.  Baseline creatinine 0.9-1.0.  Hypokalemia / hyperkalemia   Likely due to GI losses and lasix. Became hyperkalemic 05/08/14 with a potassium of 5.4.  Hold spironolactone given potassium of 5.2 today.  Hypercalcemia   Likely due to dehydration. Resolved with IV fluids.  Severe protein calorie malnutrition   Being followed by dietitian.  Severely low prealbumin.   Wean TNA & D/C at 6  PM. Continue megestrol 400 mg PO BID.  DVT Prophylaxis   SCD's due to initial mild thrombocytopenia.  Platelet count normalized as of 05/05/2014.  Code Status: Full. Family Communication: Daughter Suanne Marker updated at bedside 05/09/14. Disposition Plan: SNF when stable.   IV Access:    PICC placed 04/30/14.   Procedures and diagnostic studies:    Dg Chest 2 View 04/25/2014 IMPRESSION: No acute cardiopulmonary process. Cardiomegaly with mild central vascular congestion reidentified.   Dg Lumbar Spine Complete 04/25/2014 IMPRESSION: No acute osseous abnormality of the lumbar spine   Dg Pelvis 1-2 Views8/11/2013 IMPRESSION: Negative.   Ct Head Wo Contrast 04/25/2014 IMPRESSION: CT head: No acute intracranial process. Normal noncontrast CT of the head for age. CT cervical spine: Straightened cervical lordosis without acute fracture. Grade 1 C5-6 anterolisthesis on degenerative basis.   Ct Cervical Spine Wo Contrast 04/25/2014 IMPRESSION: CT head: No acute intracranial process. Normal noncontrast CT of the head for age. CT cervical spine: Straightened cervical lordosis without acute fracture. Grade 1 C5-6 anterolisthesis on degenerative basis.   Dg Chest [facility] 04/25/2014 IMPRESSION: Retrocardiac opacity which could represent atelectasis given the degree of hypoaeration although pneumonia could appear similar. If symptoms persist, consider PA and lateral chest radiographs obtained at full inspiration when the patient is clinically able.   CT abdomen 04/28/2014 Morphologic features of the liver compatible with cirrhosis with stigmata of portal venous hypertension. 2. Increase in abdominal ascites. 3. Hepatoma site within the segment 6 of the liver is slightly increased in size from previous exam. 4. Large right pleural effusion. 5. Large hiatal hernia 6. Atherosclerotic disease 7. Prominent retroperitoneal adenopathy is nonspecific in the setting of cirrhosis and portal venous hypertension. This appears similar to previous exam. 8. Wall thickening of the colon is also nonspecific in the setting of portal venous  hypertension. This may be due to a hypoproteinemic state or colitis.   Dg Chest 04/29/2014 Marked improvement RIGHT pleural effusion. No pneumothorax.   Dg Chest [facility] 04/29/2014 Significant increase in a right-sided pleural effusion when compared with the prior exam. Hiatal hernia.   US Thoracentesis Asp Pleural Space W/img Guide 04/29/2014 Successful ultrasound guided diagnostic and therapeutic right thoracentesis yielding 1.2 liters of pleural fluid.   TPN 04/30/2014   Dg Chest Port 05/01/2014 1.Interval placement of right upper extremity PICC with tip terminating in the proximal superior vena cava. 2. Interval accumulation of a moderate to large right-sided pleural effusion. There is also a small left pleural effusion. 3. Bibasilar atelectasis and/or consolidation.   US Thoracentesis Asp Pleural Space W/img Guide 05/02/2014 Successful ultrasound guided diagnostic and therapeutic right thoracentesis yielding 1.1 liters of pleural fluid.   Dg Chest 05/02/2014 Resolution of right pleural effusion, without pneumothorax. Bibasilar airspace disease which could represent atelectasis or infection.   US Abdomen 05/02/2014 No significant amount of ascites to perform a therapeutic paracentesis.   DG abdomen 05/03/2014 - no bowel obstruction, small bilateral pleural effusion, on right seems to be resolving; possible interstitial edema   US Thoracentesis Pleural Space 05/04/2014 Successful ultrasound guided therapeutic right thoracentesis yielding 900 cc's of pleural fluid.   CT chest w/o contrast 05/05/2014 - moderate right pleural effusion, no frank interstitial edema; cirrhosis   DG chest 05/05/2014 - pleural effusions and basilar atelectasis larger on left. Hiatal hernia.   2-D echocardiogram 05/05/14: EF 65-70%. No regional wall motion abnormalities. Grade 1 diastolic dysfunction.  US Abdomen Limited 05/06/2014 No significant amount of ascites to perform paracentesis.   Dg Chest [facility] [address]  05/07/2014 Worsening  RIGHT pleural effusion.   Dg Chest Port 1 View 05/08/14: No significant change.  Ultrasound guided placement of Pleurx catheter 05/09/14: Successful placement of a tunneled right-sided pleural drain. Approximately 1.8 L of clear straw-colored pleural fluid was aspirated following drain placement. Patient should continue to drain 1 bottle daily until fluid reaccumulation decreases.  Medical Consultants:    Dr. Burney Gauze, Oncology.  Dr. Jacqulynn Cadet, Interventional radiology   Other Consultants:    Dietitian  Physical therapy  Wound care RN   Anti-Infectives:    Flagyl 04/26/14--->  Fluconazole 05/04/14--->  Vancomycin 05/08/14--->  Subjective:   [name] is sleeping this morning and was not awakened.  Objective:    Filed Vitals:   05/09/14 1710 05/09/14 1806 05/09/14 2100 05/10/14 0535  BP: 130/58 99/52 130/53 133/50  Pulse: 85 87 92 86  Temp: 97.5 F (36.4 C) 98.3 F (36.8 C) 97.8 F (36.6 C) 97.9 F (36.6 C)  TempSrc: Oral Oral Oral Oral  Resp: 22 24 22 24   Height:      Weight:      SpO2: 95% 96% 90% 93%    Intake/Output Summary (Last 24 hours) at 05/10/14 0751 Last data filed at 05/10/14 0600  Gross per 24 hour  Intake    635 ml  Output   1500 ml  Net   -865 ml    Exam: Gen:  Sleeping soundly Cardiovascular:  RRR, No M/R/G Respiratory:  Lungs diminished right base Gastrointestinal:  Abdomen softly distended, NT, + BS Extremities:  Venous stasis dermatitis changes BLE, dressing to left sided skin abrasion  Data Reviewed:    Labs: Basic Metabolic Panel:  Recent Labs Lab 05/05/14 0500 05/06/14 0800 05/07/14 0414 05/08/14 0525 05/09/14 0552 05/10/14 0500  NA 137 130* 130* 131* 128* 128*  K 4.8 4.9 5.2 5.4* 4.9 5.2  CL 104 100 99 101 99 100  CO2 25 20 22 22 21 20   GLUCOSE 122* 187* 123* 116* 116* 108*  BUN 37* 42* 43* 46* 47* 48*  CREATININE 1.33* 1.26* 1.28* 1.32* 1.27* 1.24*  CALCIUM 9.9 9.7 9.9 10.1 9.7 9.3  MG 1.9  --    --   --  1.7  --   PHOS 3.7  --   --   --  3.0  --    GFR Estimated Creatinine Clearance: 36.2 ml/min (by C-G formula based on Cr of 1.24). Liver Function Tests:  Recent Labs Lab 05/05/14 0500 05/07/14 0414 05/09/14 0552  AST 35 57* 61*  ALT 19 26 31   ALKPHOS 123* 140* 136*  BILITOT 0.6 0.8 1.2  PROT 6.3 6.8 6.8  ALBUMIN 2.2* 2.3* 2.3*   CBC:  Recent Labs Lab 05/05/14 0500 05/07/14 0414 05/09/14 0552  WBC 8.8 9.4 15.3*  NEUTROABS  --   --  11.4*  HGB 12.5 12.7 12.5  HCT 36.7 37.9 37.2  MCV 93.1 93.8 94.2  PLT 155 163 168   BNP (last 3 results)  Recent Labs  05/08/14 0525  PROBNP 71.3   CBG:  Recent Labs Lab 05/08/14 0821 05/08/14 1159 05/08/14 1646 05/09/14 0734 05/09/14 1129  GLUCAP 147* 112* 134* 124* 119*    Microbiology Recent Results (from the past 240 hour(s))  URINE CULTURE     Status: None   Collection Time    05/03/14 11:50 AM      Result Value Ref Range Status   Specimen Description URINE, CATHETERIZED   Final   Special Requests flagyl Immunocompromised  Final   Culture  Setup Time     Final   Value: 05/03/2014 14:36     Performed at [facility]     Final   Value: NO GROWTH     Performed at [facility]   Culture     Final   Value: NO GROWTH     Performed at [facility]   Report Status 05/04/2014 FINAL   Final     Medications:   . benzonatate  100 mg Oral BID  . cholecalciferol  1,000 Units Oral Daily  . fluconazole (DIFLUCAN) IV  100 mg Intravenous Q24H  . insulin aspart  0-9 Units Subcutaneous TID WC  . lactose free nutrition  237 mL Oral TID PC & HS  . megestrol  400 mg Oral BID  . metroNIDAZOLE  500 mg Oral 3 times per day  . nystatin  5 mL Oral QID  . pantoprazole  80 mg Oral Daily  . saccharomyces boulardii  250 mg Oral BID  . sodium chloride  10-40 mL Intracatheter Q12H  . spironolactone  50 mg Oral Daily  . vancomycin  750 mg Intravenous Q24H   Continuous  Infusions: . sodium chloride Stopped (05/05/14 2000)  . Marland KitchenTPN (CLINIMIX-E) Adult 40 mL/hr at 05/09/14 1732   And  . fat emulsion 250 mL (05/09/14 1733)    Time spent: 25 minutes.    LOS: 15 days   [facility] Pager [phone]. If unable to reach me by pager, please call my cell phone at [phone].  *Please refer to [email or web address], password [email or web address] to get updated schedule on who will round on this patient, as hospitalists switch teams weekly. If 7PM-7AM, please contact night-coverage at [email or web address], password [email or web address] for any overnight needs.  05/10/2014, 7:51 AM    Information printed out and reviewed with the patient/family:     In an effort to keep you and your family informed about your hospital stay, I am providing you with this information sheet. If you or your family have any questions, please do not hesitate to have the nursing staff page me to set up a meeting time.  Also note that the hospitalist doctors typically change on Tuesdays or Wednesdays to a different hospitalist doctor.  [name] 05/10/2014 15 (Number of days in the hospital)  Treatment team:  Dr. Jacquelynn Cree, Hospitalist (Internist)  Dr. Burney Gauze, Oncologist  Pertinent labs / studies:  Your potassium was a little high today, so we are going to hold your dose of spironolactone, which can cause you to retain extra potassium.   Principle Diagnosis: Recurrent right-sided pleural effusion with respiratory difficulty, left lower extremity skin infection, Clostridium difficile diarrhea, malnourishment.  Plan for today:  Your Pleurx catheter will be drained daily until the drainage is less than 250 cc, then it can be drained every other day.  We are weaning your IV nutritional fluids in the hopes that your appetite will improve.  This will be stopped at 6 PM.  Continue supportive care with nutritional support, antibiotics to treat cellulitis, Flagyl to treat the Clostridium difficile  infection.  Increase mobility, work with physical therapy in anticipation of discharge to rehabilitation soon.  Anticipated discharge date: Depends on progress.

## 2014-05-10 NOTE — Progress Notes (Signed)
Physical Therapy Treatment Patient Details Name: [name] MRN: [id number] DOB: [birth year] Today's Date: 05/10/2014    History of Present Illness 80 yo female admitted with fall, diarrhea, LE cellulitis. Hx of HTN, cirrhosis. Pt lives alone in [place].     PT Comments    Assisted pt OOb required increased time due to pain c/o R trunk side and max c/o fatigue.  Assisted with amb limited distance with max c/o dry coughing which increase her pain level R trunk/ribs.  Follow Up Recommendations  SNF (via ambulance)     Equipment Recommendations       Recommendations for Other Services       Precautions / Restrictions Precautions Precautions: Fall Precaution Comments: bowel frequency/incontinence Restrictions Weight Bearing Restrictions: No    Mobility  Bed Mobility Overal bed mobility: Needs Assistance Bed Mobility: Supine to Sit     Supine to sit: Min assist;Mod assist     General bed mobility comments: increased time and use of rails  Transfers Overall transfer level: Needs assistance Equipment used: Rolling walker (2 wheeled) Transfers: Sit to/from Stand Sit to Stand: Min assist;Mod assist         General transfer comment: assist to rise and steady. Pt leaning posteriorly in standing.  Very unsteady.  Ambulation/Gait Ambulation/Gait assistance: +2 physical assistance;+2 safety/equipment;Mod assist Ambulation Distance (Feet): 9 Feet Assistive device: Rolling walker (2 wheeled) Gait Pattern/deviations: Step-through pattern;Trunk flexed Gait velocity: decreased   General Gait Details: very unsteady gait with limited activity tolerance.  + 2 for safety.     Stairs            Wheelchair Mobility    Modified Rankin (Stroke Patients Only)       Balance                                    Cognition Arousal/Alertness: Awake/alert Behavior During Therapy: WFL for tasks assessed/performed Overall Cognitive Status: Within  Functional Limits for tasks assessed                      Exercises      General Comments General comments (skin integrity, edema, etc.): mod assist standing balance to pull up Depends.      Pertinent Vitals/Pain Pain Assessment: 0-10 Pain Score: 7  Pain Location: R flank/rib area. Pain Descriptors / Indicators: Aching Pain Intervention(s): Patient requesting pain meds-RN notified    Home Living                      Prior Function            PT Goals (current goals can now be found in the care plan section) Progress towards PT goals: Progressing toward goals    Frequency  Min 3X/week    PT Plan      Co-evaluation             End of Session Equipment Utilized During Treatment: Gait belt Activity Tolerance: Patient limited by fatigue Patient left: in chair;with call bell/phone within reach     Time: 1052-1120 PT Time Calculation (min): 28 min  Charges:  $Gait Training: 8-22 mins $Therapeutic Activity: 8-22 mins                    G Codes:      Rica Koyanagi  PTA [facility]  Acute  Rehab Pager  [phone]  

## 2014-05-10 NOTE — Progress Notes (Signed)
Subjective: Patient is sleeping upon exam however did wake up for a short time to answer that her right drain site is sore.   Objective: Physical Exam: BP 133/50  Pulse 86  Temp(Src) 97.9 F (36.6 C) (Oral)  Resp 24  Ht 5\' 6"  (1.676 m)  Wt 171 lb 8 oz (77.792 kg)  BMI 27.69 kg/m2  SpO2 93%  General: Sleeping, NAD Lungs: CTA bilaterally, Right pleurX catheter intact without evidence of leaking, bleeding, or hematoma.   Labs: CBC  Recent Labs  05/09/14 0552  WBC 15.3*  HGB 12.5  HCT 37.2  PLT 168   BMET  Recent Labs  05/09/14 0552 05/10/14 0500  NA 128* 128*  K 4.9 5.2  CL 99 100  CO2 21 20  GLUCOSE 116* 108*  BUN 47* 48*  CREATININE 1.27* 1.24*  CALCIUM 9.7 9.3   LFT  Recent Labs  05/09/14 0552  PROT 6.8  ALBUMIN 2.3*  AST 61*  ALT 31  ALKPHOS 136*  BILITOT 1.2   PT/INR No results found for this basename: LABPROT, INR,  in the last 72 hours   Studies/Results: Ir US Guide Bx Asp/drain  05/09/2014   CLINICAL DATA:  80 year old female with a history of hepatocellular carcinoma and recurrent symptomatic right-sided pleural effusion. She has undergone 4 right-sided thoracenteses over the last several weeks.  PROCEDURE: 1. Ultrasound guidance 2. Placement of percutaneous pleural drain with indwelling catheter Interventional Radiologist:  Criselda Peaches, MD  MEDICATIONS: Patient is currently on intravenous vancomycin for lower extremity cellulitis. No additional antibiotic prophylaxis was given in addition to her current intravenous regimen.  ANESTHESIA/SEDATION: Moderate (conscious) sedation was used. 2 mg Versed, 50 mcg Fentanyl were administered intravenously. The patient's vital signs were monitored continuously by radiology nursing throughout the procedure.  Sedation Time: 20 minutes  FLUOROSCOPY TIME:  54 seconds  TECHNIQUE: Informed consent was obtained from the patient following explanation of the procedure, risks, benefits and alternatives. The  patient understands, agrees and consents for the procedure. All questions were addressed. A time out was performed.  The right thorax was interrogated with ultrasound. The patient's lung volumes are very low. The lung is relatively high riding at the level of the nipple. A suitable skin entry site inferior and lateral to the right breast was identified and marked. The region was then sterilely prepped and draped in the usual fashion using Betadine skin prep.  Local anesthesia was attained by infiltration with 1% lidocaine. A small dermatotomy was made. Under real-time sonographic guidance, a sheath needle was carefully advanced through the right lateral chest wall and into the pleural space. A 0.035 inch wire was then advanced into the pleural space. A suitable skin entry site inferior and medial to the pleural entry site was selected. Local anesthesia was again attained by infiltration with 1% lidocaine. A small dermatotomy was made. The PleurX catheter was then tunneled retrograde from the skin exit site to the dermatotomy at the pleural entry site. The catheter was cut to length. The skin tract was then dilated and a peel-away sheath advanced over the wire and into the pleural space. This was performed under fluoroscopic guidance. The pleural catheter was then advanced through the peel-away sheath and peel-away sheath discarded.  The pleural drain was connected to a low wall suction and a total of 1.8 L of clear yellow pleural fluid was successfully aspirated. The pleural drain was secured to the skin with 0 Prolene suture. The dermatotomy over the pleural access site was  closed with a single interrupted, inverted 4-0 Vicryl suture. The epidermis was sealed with Dermabond. A sterile bandage was placed.  Overall, the patient tolerated the procedure well. There was no immediate complication.  IMPRESSION: Successful placement of a tunneled right-sided pleural drain.  Approximately 1.8 L of clear straw-colored  pleural fluid was aspirated following drain placement. Patient should continue to drain 1 bottle daily until fluid reaccumulation decreases.  Signed,  Criselda Peaches, MD  [facility]  [facility]   Electronically Signed   By: Jacqulynn Cadet M.D.   On: 05/09/2014 17:35   Dg Chest Port 1 View  05/08/2014   CLINICAL DATA:  Worsening shortness of breath.  Cough.  EXAM: PORTABLE CHEST - 1 VIEW  COMPARISON:  05/07/2014 and 05/04/2014 and chest CT dated 05/05/2014  FINDINGS: PICC line appears in good position. Heart and other mediastinal structures are shifted to the left chronically.  Right pleural effusion is essentially unchanged considering what I suspect is a slight change in the patient position. There is a chronic huge hiatal hernia. Pulmonary vascularity is normal. No acute osseous abnormality.  IMPRESSION: No significant change.  Persistent moderate right pleural effusion.   Electronically Signed   By: Rozetta Nunnery M.D.   On: 05/08/2014 17:32   Ir Perc Pleural Drain W/indwell Cath W/img Guide  05/09/2014   CLINICAL DATA:  80 year old female with a history of hepatocellular carcinoma and recurrent symptomatic right-sided pleural effusion. She has undergone 4 right-sided thoracenteses over the last several weeks.  PROCEDURE: 1. Ultrasound guidance 2. Placement of percutaneous pleural drain with indwelling catheter Interventional Radiologist:  Criselda Peaches, MD  MEDICATIONS: Patient is currently on intravenous vancomycin for lower extremity cellulitis. No additional antibiotic prophylaxis was given in addition to her current intravenous regimen.  ANESTHESIA/SEDATION: Moderate (conscious) sedation was used. 2 mg Versed, 50 mcg Fentanyl were administered intravenously. The patient's vital signs were monitored continuously by radiology nursing throughout the procedure.  Sedation Time: 20 minutes  FLUOROSCOPY TIME:  54 seconds  TECHNIQUE: Informed consent  was obtained from the patient following explanation of the procedure, risks, benefits and alternatives. The patient understands, agrees and consents for the procedure. All questions were addressed. A time out was performed.  The right thorax was interrogated with ultrasound. The patient's lung volumes are very low. The lung is relatively high riding at the level of the nipple. A suitable skin entry site inferior and lateral to the right breast was identified and marked. The region was then sterilely prepped and draped in the usual fashion using Betadine skin prep.  Local anesthesia was attained by infiltration with 1% lidocaine. A small dermatotomy was made. Under real-time sonographic guidance, a sheath needle was carefully advanced through the right lateral chest wall and into the pleural space. A 0.035 inch wire was then advanced into the pleural space. A suitable skin entry site inferior and medial to the pleural entry site was selected. Local anesthesia was again attained by infiltration with 1% lidocaine. A small dermatotomy was made. The PleurX catheter was then tunneled retrograde from the skin exit site to the dermatotomy at the pleural entry site. The catheter was cut to length. The skin tract was then dilated and a peel-away sheath advanced over the wire and into the pleural space. This was performed under fluoroscopic guidance. The pleural catheter was then advanced through the peel-away sheath and peel-away sheath discarded.  The pleural drain was connected to a low wall suction and a total  of 1.8 L of clear yellow pleural fluid was successfully aspirated. The pleural drain was secured to the skin with 0 Prolene suture. The dermatotomy over the pleural access site was closed with a single interrupted, inverted 4-0 Vicryl suture. The epidermis was sealed with Dermabond. A sterile bandage was placed.  Overall, the patient tolerated the procedure well. There was no immediate complication.  IMPRESSION:  Successful placement of a tunneled right-sided pleural drain.  Approximately 1.8 L of clear straw-colored pleural fluid was aspirated following drain placement. Patient should continue to drain 1 bottle daily until fluid reaccumulation decreases.  Signed,  Criselda Peaches, MD  [facility]  [facility]   Electronically Signed   By: Jacqulynn Cadet M.D.   On: 05/09/2014 17:35    Assessment/Plan: HCC Recurrent right pleural effusions S/p right pleurX catheter placement 9/97 without complications, 1.8 liters of fluid removed.  Continue to drain daily until decreased output, then drain when needed for symptomatic relief. Pain control PRN, pain at site should improve, monitor site for signs of infection, dressing changes with each use.  Contact IR with any questions.     LOS: 15 days    Rockney Ghee 05/10/2014 12:27 PM

## 2014-05-10 NOTE — Progress Notes (Signed)
PARENTERAL NUTRITION CONSULT NOTE - Follow up  Pharmacy Consult for TPN Indication: severe malnutrition in setting of hepatocellular carcinoma, chronic active hepatitis, and C.diff - associated diarrhea  Allergies  Allergen Reactions  . Avelox [Moxifloxacin Hcl In Nacl] Anaphylaxis  . Statins Other (See Comments)    Liver function elevations  . Sulfa Antibiotics Hives and Itching  . Ace Inhibitors     Unknown  . Doxycycline     Unknown  . Infed [Iron Dextran] Other (See Comments)    IV IRON, Unknown  . Infergen [Interferon Alfacon-1]   . Vancomycin Other (See Comments)    Flushing, erythema to neck and face, tolerated vanc at lower infusion rate  . Ciprofloxacin Other (See Comments)    Complains of sore mouth  . Fenofibrate Other (See Comments)    constipation   Patient Measurements: Height: 5\' 6"  (167.6 cm) Weight: 171 lb 8 oz (77.792 kg) IBW/kg (Calculated) : 59.3 No updated weight since 8/3  Vital Signs: Temp: 97.9 F (36.6 C) (08/18 0535) Temp src: Oral (08/18 0535) BP: 133/50 mmHg (08/18 0535) Pulse Rate: 86 (08/18 0535) Intake/Output from previous day: 08/17 0701 - 08/18 0700 In: 635 [I.V.:10; TPN:625] Out: 1500 [Urine:1500]  Intake/Output from this shift:    Labs:  Recent Labs  05/09/14 0552  WBC 15.3*  HGB 12.5  HCT 37.2  PLT 168    Recent Labs  05/08/14 0525 05/09/14 0552 05/10/14 0500  NA 131* 128* 128*  K 5.4* 4.9 5.2  CL 101 99 100  CO2 22 21 20   GLUCOSE 116* 116* 108*  BUN 46* 47* 48*  CREATININE 1.32* 1.27* 1.24*  CALCIUM 10.1 9.7 9.3  MG  --  1.7  --   PHOS  --  3.0  --   PROT  --  6.8  --   ALBUMIN  --  2.3*  --   AST  --  61*  --   ALT  --  31  --   ALKPHOS  --  136*  --   BILITOT  --  1.2  --   PREALBUMIN  --  11.8*  --   TRIG  --  51  --   Corrected calcium: 10.7 Estimated Creatinine Clearance: 36.2 ml/min (by C-G formula based on Cr of 1.24).    Recent Labs  05/09/14 0734 05/09/14 1129 05/10/14 0759  GLUCAP  124* 119* 103*    Medications:  Scheduled:  . cholecalciferol  1,000 Units Oral Daily  . lactose free nutrition  237 mL Oral TID PC & HS  . megestrol  400 mg Oral BID  . metroNIDAZOLE  500 mg Oral 3 times per day  . nystatin  5 mL Oral QID  . pantoprazole  80 mg Oral Daily  . saccharomyces boulardii  250 mg Oral BID  . sodium chloride  10-40 mL Intracatheter Q12H  . vancomycin  750 mg Intravenous Q24H   Infusions:  . sodium chloride Stopped (05/05/14 2000)    Assessment: 50 yoF with hepatocellular carcinoma and chronic active hepatitis admitted 8/3 after LOC at home associated with recent nausea and loose stools. Was previously admitted 7/26 for cellulitis; received Rocephin x 1 dose, vancomycin x 3 doses, discharged on po Keflex. Stool tested positive for C.diff 8/4. Poor po intake PTA, recent 10 lb weight loss noted. Orders were received from oncology to begin TPN 8/8 for malnutrition.  Pharmacy dosing assistance was requested.   Nutritional Goals:  RD recs 8/5: 75-85 grams of protein and  1550-1750 kCal per day. Clinimix E 5/15 at a goal rate of 86ml/hr + 20% fat emulsion at 42ml/hr provides 84g/day protein, 1672 Kcal/day.   Significant events: 8/11: TPN advanced to goal. Flexiseal dc'd. Megace started for appetite stimulation. 8/12: Diflucan started for thrush.  8/12: R thoracentesis for drainage of pleural effusion 8/13: Planning paracentesis today 8/14: Paracentesis canceled 8/16: changed to no-electrolyte TNA formula, K elevated 8/17: changed back to Clinimix-E 5/15, rate tapered to 40 mL/hr   8/18: D#11 TPN TPN Access:  PICC (placed 8/8)  Current Nutrition:  Clinimix E 5/15 at 29ml/hr + 20% fat emulsion at 34ml/hr Regular diet Boost Plus TID and HS  IVF: NS at 10 mL/hr only when administering IV medications  Insulin Requirements in the past 24 hours:  None;  on sensitive-scale SSI TID   Labs:  Electrolytes - Na remains slightly low but stable; unable to  adjust Na content of premixed Clinimix-E.  K rising again (spironolactone DCd by MD for now). Other lytes WNL.  Renal: SCr and BUN slightly elevated but stable.   Hepatic : Alk Phos up but < 1.5 x ULN.  AST elevated but <2x ULN. Other LFTs below ULN.  CBGs - 103-134; remaining < 150  TGs - WNL, last checked 8/17  Prealbumin - 6.1 (8/10), 11.8 (8/17)  Orders received from Dr. Rockne Menghini to wean TPN/lipids and discontinue after present bag.  Plan:   At 12 noon, reduce Clinimix-E rate to 20 mL/hr  At 12 noon, reduce Fat Emulsion rate to 5 mL/hr  At 6 pm, discontinue TPN and Fat Emulsion.  Continue regular diet.  Change multivitamins and trace elements to PO multivitamin with minerals.  Discontinue CBGs and sliding scale insulin.  Discontinue TPN labs.   Clayburn Pert, PharmD, BCPS Pager: [phone] 05/10/2014  10:14 AM

## 2014-05-11 ENCOUNTER — Other Ambulatory Visit (HOSPITAL_COMMUNITY): Payer: Self-pay

## 2014-05-11 ENCOUNTER — Inpatient Hospital Stay
Admission: AD | Admit: 2014-05-11 | Discharge: 2014-06-08 | Disposition: A | Discharge status: Skilled Nursing Facility | Payer: Self-pay | Source: Ambulatory Visit | Attending: Internal Medicine | Admitting: Internal Medicine

## 2014-05-11 LAB — CBC
HCT: 36.9 % (ref 36.0–46.0)
Hemoglobin: 12.5 g/dL (ref 12.0–15.0)
MCH: 32.1 pg (ref 26.0–34.0)
MCHC: 33.9 g/dL (ref 30.0–36.0)
MCV: 94.9 fL (ref 78.0–100.0)
PLATELETS: 166 10*3/uL (ref 150–400)
RBC: 3.89 MIL/uL (ref 3.87–5.11)
RDW: 14.8 % (ref 11.5–15.5)
WBC: 7.9 10*3/uL (ref 4.0–10.5)

## 2014-05-11 LAB — BASIC METABOLIC PANEL
Anion gap: 10 (ref 5–15)
BUN: 50 mg/dL — ABNORMAL HIGH (ref 6–23)
CALCIUM: 9.3 mg/dL (ref 8.4–10.5)
CO2: 19 meq/L (ref 19–32)
CREATININE: 1.3 mg/dL — AB (ref 0.50–1.10)
Chloride: 99 mEq/L (ref 96–112)
GFR calc non Af Amer: 37 mL/min — ABNORMAL LOW (ref >90)
GFR, EST AFRICAN AMERICAN: 43 mL/min — AB (ref >90)
Glucose, Bld: 89 mg/dL (ref 70–99)
Potassium: 5.3 mEq/L (ref 3.7–5.3)
Sodium: 128 mEq/L — ABNORMAL LOW (ref 137–147)

## 2014-05-11 MED ORDER — MEGESTROL ACETATE 40 MG/ML PO SUSP: 400.0000 mg | Freq: Two times a day (BID) | ORAL | Status: DC

## 2014-05-11 MED ORDER — BENZONATATE 100 MG PO CAPS: 100.0000 mg | ORAL_CAPSULE | Freq: Two times a day (BID) | ORAL | Status: DC | PRN

## 2014-05-11 MED ORDER — NYSTATIN 100000 UNIT/ML MT SUSP: 5.0000 mL | Freq: Four times a day (QID) | OROMUCOSAL | Status: AC

## 2014-05-11 MED ORDER — HYDROCODONE-ACETAMINOPHEN 5-325 MG PO TABS: 1.0000 | ORAL_TABLET | ORAL | Status: DC | PRN

## 2014-05-11 MED ORDER — ADULT MULTIVITAMIN W/MINERALS CH: 1.0000 | ORAL_TABLET | Freq: Every day | ORAL | Status: AC

## 2014-05-11 MED ORDER — IPRATROPIUM BROMIDE 0.02 % IN SOLN: 0.5000 mg | Freq: Four times a day (QID) | RESPIRATORY_TRACT | Status: DC | PRN

## 2014-05-11 MED ORDER — SACCHAROMYCES BOULARDII 250 MG PO CAPS: 250.0000 mg | ORAL_CAPSULE | Freq: Two times a day (BID) | ORAL | Status: AC

## 2014-05-11 MED ORDER — LIP MEDEX EX OINT: TOPICAL_OINTMENT | CUTANEOUS | Status: DC | PRN

## 2014-05-11 MED ORDER — METRONIDAZOLE 500 MG PO TABS: 500.0000 mg | ORAL_TABLET | Freq: Three times a day (TID) | ORAL | Status: DC

## 2014-05-11 MED ORDER — MORPHINE SULFATE 2 MG/ML IJ SOLN: 1.0000 mg | INTRAMUSCULAR | Status: DC | PRN

## 2014-05-11 MED ORDER — LEVALBUTEROL HCL 1.25 MG/0.5ML IN NEBU: 1.2500 mg | INHALATION_SOLUTION | Freq: Four times a day (QID) | RESPIRATORY_TRACT | Status: DC | PRN

## 2014-05-11 MED ORDER — VANCOMYCIN HCL IN DEXTROSE 750-5 MG/150ML-% IV SOLN: 750.0000 mg | INTRAVENOUS | Status: DC

## 2014-05-11 NOTE — Progress Notes (Signed)
CSW continuing to follow for disposition planning.   CSW received notification from [facility] that RNCM spoke with pt daughter, Suanne Marker who was able to tour [facility] and want to accept bed offer from [facility] [place] contacted [facility] and left message. CSW will make sure that [facility] is updated that pt discharging to [place] discharging to [facility] today.  No further social work needs identified at this time.  CSW signing off.   Alison Murray, MSW, [place] Work [phone]

## 2014-05-11 NOTE — Discharge Summary (Signed)
Physician Discharge Summary  [name] HYQ:[id number] DOB: Feb 05, 1928 DOA: 04/25/2014  PCP: Alesia Richards, MD  Admit date: 04/25/2014 Discharge date: 05/11/2014   Recommendations for Outpatient Follow-Up:   1. The patient is being discharged to [place]. 2. Recommend daily drainage of PleurX catheter (may need to drain limited amounts multiple times per day secondary to patient discomfort during drainage). 3. The patient's course of Flagyl therapy will end on 05/13/14. Continue Flagyl through this date. 4. The patient will complete a seven-day course of vancomycin for cellulitis on 05/14/14. 5. Recommend ongoing wound care/assessment of her left lower extremity anterior skin abrasion.   Discharge Diagnosis:   Principal Problem:    Acute respiratory failure with hypoxia secondary to recurrent rapidly reaccumulating right pleural effusion status post Pleurx drainage catheter placement Active Problems:    Hypertension    Vitamin D Deficiency    Dehydration    Acute renal failure    Fall    Malnutrition of moderate degree    Recurrent right pleural effusion    Ascites    Generalized weakness    Oral thrush    Enteritis due to Clostridium difficile    Hypokalemia    Hypercalcemia    Protein-calorie malnutrition, severe    Cellulitis of leg, left   Discharge Condition: Improved.  Diet recommendation: Regular.   History of Present Illness:   [name] is an 80 y.o. female with a PMH of hypertension, prior history of MI, TIA, hepatoma who presented to [facility] ED 04/25/2014 status post fall at home. Pt also reported nausea and vomiting and having loose bowel movements. On admission, x-ray studies were done of the lumbar spine and pelvis but did not reveal acute fractures. CT head and CT cervical spine did not show acute intracranial findings. Initial chest x-ray showed no acute cardiopulmonary process. Blood work revealed mild hyponatremia, sodium 136.  Creatinine was 1.45 and calcium 10.6. Urinalysis showed only trace leukocytes. Hospital course has been complicated by the development of C.diff diarrhea (in the setting of recent antibiotic use for treatment of cellulitis) and a recurrent right-sided pleural effusion s/p 4 ultrasound guided thoracentesis since the admission with ultimate placement of a PleurX drainage catheter. Her abdomen continues to be distended but based on abdominal US there is not enough ascites for drainage. She has also required TNA for nutritional support, which was discontinued 05/10/14.  Hospital Course by Problem:   Principal Problem:  Acute respiratory failure with hypoxia / Persistent right pleural effusion  The patient has had a total of 4 thoracenteses since admission (cytology/cultures negative), and IR consulted for consideration of a Pleurx catheter, which was placed 05/09/14, which will be drained daily.  Continue Tessalon capsules for cough twice daily. Continue bronchodilator treatments, Xopenex and Atrovent as needed for shortness of breath or wheezing.  Pro BNP not elevated. 2-D echocardiogram done 05/05/14, EF 65-70% with grade 1 diastolic dysfunction. Effusion thought to be related to hypoalbuminemic state and cirrhosis with malignant effusion less likely given negative cytology.  Active Problems:  Recurrent left lower extremity cellulitis  Unfortunately, needed further antibiotics but challenging with history of multiple antibiotic allergies. Continue Vancomycin (adverse reaction only) and run infusion in at low rate for a seven-day course which will be completed on 05/14/14.  Wound care RN evaluation performed 05/08/14. Continue local wound care per recommendations.  Abdominal ascites with history of hepatoma and cirrhosis secondary to NASH  Paracentesis attempted 3 times and per IR not significant amount of ascites  seen.  The patient is status post radiofrequency ablation of hepatoma and typically takes  spironolactone (hold secondary to hyperkalemia).  Fall, generalized weakness  Likely due to dehydration. Per PT evaluation, pt requires SNF on discharge and she and her family are agreeable to SNF placement.  Ongoing shortness of breath has precluded progression of activity. Hopefully can progress now that Pleurx in.   Oral thrush  Continue nystatin swish and swallow. Status post 5 days of therapy with Diflucan. Oral thrush is improving.  Hypertension  Blood pressure at goal. All antihypertensives on hold. Okay to resume metoprolol at discharge but would continue to hold Lasix/spironolactone.  C.diff enteritis /Abdominal distention  Continue Flagyl through 05/13/14 for a two-week course of therapy. No further reports of diarrhea.  Acute renal failure  Likely secondary to dehydration and diuretic therapy.  Baseline creatinine 0.9-1.0. Continue to hold outpatient doses of Lasix and spironolactone until renal function back to baseline values.  Hypokalemia / hyperkalemia  Likely due to GI losses and lasix. Became hyperkalemic 05/08/14 with a potassium of 5.4.  Continue to hold spironolactone given potassium of greater than 5.  Hypercalcemia  Likely due to dehydration. Resolved with IV fluids.  Severe protein calorie malnutrition  Being followed by dietitian, who is now recommending tube feeding.  Severely low prealbumin.  TNA discontinued 05/10/14. Continue megestrol 400 mg PO BID. Appetite picking up, would hold off on any further supplemental tube feeding for now.    Medical Consultants:   Dr. Burney Gauze, Oncology.  Dr. Jacqulynn Cadet, Interventional radiology  Discharge Exam:   Filed Vitals:   05/11/14 0732  BP: 124/54  Pulse: 85  Temp: 98.4 F (36.9 C)  Resp: 20   Filed Vitals:   05/09/14 2100 05/10/14 0535 05/10/14 2318 05/11/14 0732  BP: 130/53 133/50 105/53 124/54  Pulse: 92 86 78 85  Temp: 97.8 F (36.6 C) 97.9 F (36.6 C) 97.1 F (36.2 C) 98.4 F (36.9  C)  TempSrc: Oral Oral Axillary Oral  Resp: 22 24 20 20   Height:      Weight:      SpO2: 90% 93% 92% 92%    Gen:  NAD Cardiovascular:  RRR, No M/R/G Respiratory: Lungs diminished in the right base, but improved overall Gastrointestinal: Abdomen soft, NT/ND with normal active bowel sounds. Extremities: Venous stasis changes bilateral lower extremities with dressing intact to the left-sided skin abrasion. Please see progress note 05/09/14 for documentation/picture of findings.   The results of significant diagnostics from this hospitalization (including imaging, microbiology, ancillary and laboratory) are listed below for reference.     Procedures and Diagnostic Studies:   Dg Chest 2 View 04/25/2014 IMPRESSION: No acute cardiopulmonary process. Cardiomegaly with mild central vascular congestion reidentified.  Dg Lumbar Spine Complete 04/25/2014 IMPRESSION: No acute osseous abnormality of the lumbar spine  Dg Pelvis 1-2 Views8/11/2013 IMPRESSION: Negative.  Ct Head Wo Contrast 04/25/2014 IMPRESSION: CT head: No acute intracranial process. Normal noncontrast CT of the head for age. CT cervical spine: Straightened cervical lordosis without acute fracture. Grade 1 C5-6 anterolisthesis on degenerative basis.  Ct Cervical Spine Wo Contrast 04/25/2014 IMPRESSION: CT head: No acute intracranial process. Normal noncontrast CT of the head for age. CT cervical spine: Straightened cervical lordosis without acute fracture. Grade 1 C5-6 anterolisthesis on degenerative basis.  Dg Chest [facility] 04/25/2014 IMPRESSION: Retrocardiac opacity which could represent atelectasis given the degree of hypoaeration although pneumonia could appear similar. If symptoms persist, consider PA and lateral chest radiographs obtained at full inspiration  when the patient is clinically able.  CT abdomen 04/28/2014 Morphologic features of the liver compatible with cirrhosis with stigmata of portal venous hypertension. 2. Increase in abdominal  ascites. 3. Hepatoma site within the segment 6 of the liver is slightly increased in size from previous exam. 4. Large right pleural effusion. 5. Large hiatal hernia 6. Atherosclerotic disease 7. Prominent retroperitoneal adenopathy is nonspecific in the setting of cirrhosis and portal venous hypertension. This appears similar to previous exam. 8. Wall thickening of the colon is also nonspecific in the setting of portal venous hypertension. This may be due to a hypoproteinemic state or colitis.  Dg Chest 04/29/2014 Marked improvement RIGHT pleural effusion. No pneumothorax.  Dg Chest [facility] 04/29/2014 Significant increase in a right-sided pleural effusion when compared with the prior exam. Hiatal hernia.  US Thoracentesis Asp Pleural Space W/img Guide 04/29/2014 Successful ultrasound guided diagnostic and therapeutic right thoracentesis yielding 1.2 liters of pleural fluid.  TPN 04/30/2014  Dg Chest Port 05/01/2014 1.Interval placement of right upper extremity PICC with tip terminating in the proximal superior vena cava. 2. Interval accumulation of a moderate to large right-sided pleural effusion. There is also a small left pleural effusion. 3. Bibasilar atelectasis and/or consolidation.  US Thoracentesis Asp Pleural Space W/img Guide 05/02/2014 Successful ultrasound guided diagnostic and therapeutic right thoracentesis yielding 1.1 liters of pleural fluid.  Dg Chest 05/02/2014 Resolution of right pleural effusion, without pneumothorax. Bibasilar airspace disease which could represent atelectasis or infection.  US Abdomen 05/02/2014 No significant amount of ascites to perform a therapeutic paracentesis.  DG abdomen 05/03/2014 - no bowel obstruction, small bilateral pleural effusion, on right seems to be resolving; possible interstitial edema  US Thoracentesis Pleural Space 05/04/2014 Successful ultrasound guided therapeutic right thoracentesis yielding 900 cc's of pleural fluid.  CT chest w/o contrast 05/05/2014 - moderate  right pleural effusion, no frank interstitial edema; cirrhosis  DG chest 05/05/2014 - pleural effusions and basilar atelectasis larger on left. Hiatal hernia.  2-D echocardiogram 05/05/14: EF 65-70%. No regional wall motion abnormalities. Grade 1 diastolic dysfunction.  US Abdomen Limited 05/06/2014 No significant amount of ascites to perform paracentesis.  Dg Chest Port 1 View 05/07/2014 Worsening RIGHT pleural effusion.  Dg Chest Port 1 View 05/08/14: No significant change. Ultrasound guided placement of Pleurx catheter 05/09/14: Successful placement of a tunneled right-sided pleural drain. Approximately 1.8 L of clear straw-colored pleural fluid was aspirated following drain placement. Patient should continue to drain 1 bottle daily until fluid reaccumulation decreases.   Labs:   Basic Metabolic Panel:  Recent Labs Lab 05/05/14 0500  05/07/14 0414 05/08/14 0525 05/09/14 0552 05/10/14 0500 05/11/14 0515  NA 137  < > 130* 131* 128* 128* 128*  K 4.8  < > 5.2 5.4* 4.9 5.2 5.3  CL 104  < > 99 101 99 100 99  CO2 25  < > 22 22 21 20 19   GLUCOSE 122*  < > 123* 116* 116* 108* 89  BUN 37*  < > 43* 46* 47* 48* 50*  CREATININE 1.33*  < > 1.28* 1.32* 1.27* 1.24* 1.30*  CALCIUM 9.9  < > 9.9 10.1 9.7 9.3 9.3  MG 1.9  --   --   --  1.7  --   --   PHOS 3.7  --   --   --  3.0  --   --   < > = values in this interval not displayed. GFR Estimated Creatinine Clearance: 34.5 ml/min (by C-G formula based on Cr of  1.3). Liver Function Tests:  Recent Labs Lab 05/05/14 0500 05/07/14 0414 05/09/14 0552  AST 35 57* 61*  ALT 19 26 31   ALKPHOS 123* 140* 136*  BILITOT 0.6 0.8 1.2  PROT 6.3 6.8 6.8  ALBUMIN 2.2* 2.3* 2.3*   CBC:  Recent Labs Lab 05/05/14 0500 05/07/14 0414 05/09/14 0552 05/11/14 0515  WBC 8.8 9.4 15.3* 7.9  NEUTROABS  --   --  11.4*  --   HGB 12.5 12.7 12.5 12.5  HCT 36.7 37.9 37.2 36.9  MCV 93.1 93.8 94.2 94.9  PLT 155 163 168 166   CBG:  Recent Labs Lab  05/09/14 0734 05/09/14 1129 05/10/14 0759 05/10/14 1208 05/10/14 1813  GLUCAP 124* 119* 103* 116* 121*   Lipid Profile  Recent Labs  05/09/14 0552  TRIG 51   Microbiology Recent Results (from the past 240 hour(s))  URINE CULTURE     Status: None   Collection Time    05/03/14 11:50 AM      Result Value Ref Range Status   Specimen Description URINE, CATHETERIZED   Final   Special Requests flagyl Immunocompromised   Final   Culture  Setup Time     Final   Value: 05/03/2014 14:36     Performed at [place]     Final   Value: NO GROWTH     Performed at [facility]   Culture     Final   Value: NO GROWTH     Performed at [facility]   Report Status 05/04/2014 FINAL   Final     Discharge Instructions:      Medication List    STOP taking these medications       cephALEXin 500 MG capsule  Commonly known as:  KEFLEX     furosemide 40 MG tablet  Commonly known as:  LASIX     omeprazole 40 MG capsule  Commonly known as:  PRILOSEC     PROBIOTIC DAILY PO     spironolactone 50 MG tablet  Commonly known as:  ALDACTONE      TAKE these medications       ALPRAZolam 1 MG tablet  Commonly known as:  XANAX  Take 0.5 tablets (0.5 mg total) by mouth at bedtime as needed for sleep or anxiety.     atenolol 50 MG tablet  Commonly known as:  TENORMIN  Take 1 tablet (50 mg total) by mouth daily.     benzonatate 100 MG capsule  Commonly known as:  TESSALON  Take 1 capsule (100 mg total) by mouth 2 (two) times daily as needed for cough.     cholecalciferol 1000 UNITS tablet  Commonly known as:  VITAMIN D  Take 1,000 Units by mouth daily.     cyclobenzaprine 10 MG tablet  Commonly known as:  FLEXERIL  Take 10 mg by mouth 2 (two) times daily as needed for muscle spasms.     HYDROcodone-acetaminophen 5-325 MG per tablet  Commonly known as:  NORCO/VICODIN  Take 1-2 tablets by mouth every 4 (four) hours as needed for moderate  pain.     ipratropium 0.02 % nebulizer solution  Commonly known as:  ATROVENT  Take 2.5 mLs (0.5 mg total) by nebulization every 6 (six) hours as needed for wheezing or shortness of breath.     lactose free nutrition Liqd  Take 237 mLs by mouth 2 (two) times daily between meals.     levalbuterol 1.25  MG/0.5ML nebulizer solution  Commonly known as:  XOPENEX  Take 1.25 mg by nebulization every 6 (six) hours as needed for wheezing or shortness of breath.     lip balm ointment  Apply topically as needed for lip care.     megestrol 40 MG/ML suspension  Commonly known as:  MEGACE  Take 10 mLs (400 mg total) by mouth 2 (two) times daily.     metroNIDAZOLE 500 MG tablet  Commonly known as:  FLAGYL  Take 1 tablet (500 mg total) by mouth every 8 (eight) hours.     morphine 2 MG/ML injection  Inject 0.5 mLs (1 mg total) into the vein every 3 (three) hours as needed.     multivitamin with minerals Tabs tablet  Take 1 tablet by mouth daily.     NITROSTAT 0.4 MG SL tablet  Generic drug:  nitroGLYCERIN  Place 0.4 mg under the tongue every 5 (five) minutes as needed for chest pain.     nystatin 100000 UNIT/ML suspension  Commonly known as:  MYCOSTATIN  Take 5 mLs (500,000 Units total) by mouth 4 (four) times daily.     Pancrelipase (Lip-Prot-Amyl) 24000 UNITS Cpep  Commonly known as:  CREON  Take 1 capsule (24,000 Units total) by mouth 3 (three) times daily before meals.     saccharomyces boulardii 250 MG capsule  Commonly known as:  FLORASTOR  Take 1 capsule (250 mg total) by mouth 2 (two) times daily.     triamcinolone cream 0.1 %  Commonly known as:  KENALOG  Apply 1 application topically 2 (two) times daily as needed. Eczema     Vancomycin 750 MG/150ML Soln  Commonly known as:  VANCOCIN  Inject 150 mLs (750 mg total) into the vein daily.          Time coordinating discharge: 45 minutes.  Signed:  Aayush Gelpi  Pager [phone] [facility] 05/11/2014, 11:17  AM

## 2014-05-11 NOTE — Progress Notes (Signed)
ANTIBIOTIC CONSULT NOTE - Follow up  Pharmacy Consult for vancomycin Indication: cellulitis  Allergies  Allergen Reactions  . Avelox [Moxifloxacin Hcl In Nacl] Anaphylaxis  . Statins Other (See Comments)    Liver function elevations  . Sulfa Antibiotics Hives and Itching  . Ace Inhibitors     Unknown  . Doxycycline     Unknown  . Infed [Iron Dextran] Other (See Comments)    IV IRON, Unknown  . Infergen [Interferon Alfacon-1]   . Vancomycin Other (See Comments)    Flushing, erythema to neck and face, tolerated vanc at lower infusion rate  . Ciprofloxacin Other (See Comments)    Complains of sore mouth  . Fenofibrate Other (See Comments)    constipation    Patient Measurements: Height: 5\' 6"  (167.6 cm) Weight: 171 lb 8 oz (77.792 kg) IBW/kg (Calculated) : 59.3 Adjusted Body Weight: 67kg   Assessment: [name] admitted 8/3 after a fall and c/o of fever. Patient is well known to pharmacy for [facility] consult. Patient currently being treated for C.difficile secondary to recent admission (7/26-7/29) antibiotic course for cellulitis, patient was treated with vancomycin inpatient and then sent home on a course of cephalexin. Antibiotics were held on admission. Pharmacy has now been consulted to dose vancomycin for cellulitis. Noted patient with adverse reaction of flushing to vancomycin but can tolerate administration with lower infusion rate  Antiinfectives 8/7 >> fluconazole x1 8/4 >> metronidazole (MD) >> 8/12 >> fluconazole (MD) >> 8/17 8/16 >> vancomycin >>  Labs / vitals Tmax: afebrile WBCs: improved to WNL Renal: SCr remains elevated to 1.3 (baseline ~1.0), Cl~ 35 CG/N  Microbiology 8/3 blood x2: NGF 8/3 urine: NGF  8/3 GI pathogen panel: +C.diff toxin A/B, all others negative 8/3 stool: no salmonella, shigella, campylobacter, yersinia, or E.coli 0157:H7 8/3 urine: 50K Klebsiella ornithinolytica (R=amp, Ancef, CTX, Zosyn; I=macrobid; S=cipro, gent, tobra, Bactrim) 8/4  C.diff PCR: POSITIVE 8/7 R pleural effusion fluid: NGF 8/11 urine: NGF  8/19: D4 vancomycin for cellulitis, unsure of LOT. Vancomycin running at half of normal rate due to adverse flushing reaction experienced previously but can tolerate at reduced rate. D16 metronidazole per MD for C.difficile diarrhea. S/p 9 days fluconazole per MD for oral thrush, continues on nystatin rinse   Goal of Therapy:  Vancomycin trough level 10-15 mcg/ml  Plan:  - continue vancomycin 750mg  IV q24h, each dose to run over 120 min - vancomycin trough 8/20 at steady state if conitnues - follow-up clinical course, culture results, renal function - follow-up antibiotic de-escalation and length of therapy  Thank you for the consult.  Currie Paris, PharmD, BCPS Pager: [phone] Pharmacy: [phone] 05/11/2014 11:12 AM

## 2014-05-12 LAB — URINE MICROSCOPIC-ADD ON

## 2014-05-12 LAB — URINALYSIS, ROUTINE W REFLEX MICROSCOPIC
Bilirubin Urine: NEGATIVE
Glucose, UA: NEGATIVE mg/dL
KETONES UR: NEGATIVE mg/dL
NITRITE: NEGATIVE
PROTEIN: NEGATIVE mg/dL
Specific Gravity, Urine: 1.017 (ref 1.005–1.030)
Urobilinogen, UA: 0.2 mg/dL (ref 0.0–1.0)
pH: 5.5 (ref 5.0–8.0)

## 2014-05-12 LAB — CBC WITH DIFFERENTIAL/PLATELET
BASOS ABS: 0.1 10*3/uL (ref 0.0–0.1)
BASOS PCT: 1 % (ref 0–1)
EOS PCT: 2 % (ref 0–5)
Eosinophils Absolute: 0.1 10*3/uL (ref 0.0–0.7)
HCT: 36.5 % (ref 36.0–46.0)
Hemoglobin: 12.6 g/dL (ref 12.0–15.0)
Lymphocytes Relative: 21 % (ref 12–46)
Lymphs Abs: 1.6 10*3/uL (ref 0.7–4.0)
MCH: 32.3 pg (ref 26.0–34.0)
MCHC: 34.5 g/dL (ref 30.0–36.0)
MCV: 93.6 fL (ref 78.0–100.0)
Monocytes Absolute: 1 10*3/uL (ref 0.1–1.0)
Monocytes Relative: 14 % — ABNORMAL HIGH (ref 3–12)
Neutro Abs: 4.7 10*3/uL (ref 1.7–7.7)
Neutrophils Relative %: 62 % (ref 43–77)
PLATELETS: 193 10*3/uL (ref 150–400)
RBC: 3.9 MIL/uL (ref 3.87–5.11)
RDW: 15.2 % (ref 11.5–15.5)
WBC: 7.4 10*3/uL (ref 4.0–10.5)

## 2014-05-12 LAB — COMPREHENSIVE METABOLIC PANEL
ALBUMIN: 2.2 g/dL — AB (ref 3.5–5.2)
ALT: 30 U/L (ref 0–35)
AST: 57 U/L — AB (ref 0–37)
Alkaline Phosphatase: 165 U/L — ABNORMAL HIGH (ref 39–117)
Anion gap: 12 (ref 5–15)
BUN: 42 mg/dL — ABNORMAL HIGH (ref 6–23)
CALCIUM: 9.3 mg/dL (ref 8.4–10.5)
CO2: 19 meq/L (ref 19–32)
CREATININE: 1.14 mg/dL — AB (ref 0.50–1.10)
Chloride: 105 mEq/L (ref 96–112)
GFR calc Af Amer: 50 mL/min — ABNORMAL LOW (ref >90)
GFR, EST NON AFRICAN AMERICAN: 43 mL/min — AB (ref >90)
Glucose, Bld: 87 mg/dL (ref 70–99)
Potassium: 4.4 mEq/L (ref 3.7–5.3)
SODIUM: 136 meq/L — AB (ref 137–147)
TOTAL PROTEIN: 6.7 g/dL (ref 6.0–8.3)
Total Bilirubin: 1.5 mg/dL — ABNORMAL HIGH (ref 0.3–1.2)

## 2014-05-12 LAB — PROTIME-INR
INR: 1.59 — AB (ref 0.00–1.49)
Prothrombin Time: 19 seconds — ABNORMAL HIGH (ref 11.6–15.2)

## 2014-05-12 LAB — FERRITIN: FERRITIN: 238 ng/mL (ref 10–291)

## 2014-05-12 LAB — TSH: TSH: 2.76 u[IU]/mL (ref 0.350–4.500)

## 2014-05-12 LAB — HEMOGLOBIN A1C
HEMOGLOBIN A1C: 5.7 % — AB (ref <5.7)
Mean Plasma Glucose: 117 mg/dL — ABNORMAL HIGH (ref <117)

## 2014-05-12 LAB — PROCALCITONIN: Procalcitonin: 0.23 ng/mL

## 2014-05-12 LAB — LIPID PANEL
CHOLESTEROL: 68 mg/dL (ref 0–200)
HDL: 16 mg/dL — ABNORMAL LOW (ref >39)
LDL Cholesterol: 42 mg/dL (ref 0–99)
Total CHOL/HDL Ratio: 4.3 RATIO
Triglycerides: 52 mg/dL (ref <150)
VLDL: 10 mg/dL (ref 0–40)

## 2014-05-12 LAB — PHOSPHORUS: PHOSPHORUS: 2.9 mg/dL (ref 2.3–4.6)

## 2014-05-12 LAB — T4, FREE: Free T4: 1.24 ng/dL (ref 0.80–1.80)

## 2014-05-12 LAB — VANCOMYCIN, TROUGH: VANCOMYCIN TR: 12.8 ug/mL (ref 10.0–20.0)

## 2014-05-12 LAB — PREALBUMIN: Prealbumin: 12.7 mg/dL — ABNORMAL LOW (ref 17.0–34.0)

## 2014-05-12 LAB — MAGNESIUM: MAGNESIUM: 2 mg/dL (ref 1.5–2.5)

## 2014-05-13 ENCOUNTER — Other Ambulatory Visit (HOSPITAL_COMMUNITY): Payer: Self-pay

## 2014-05-13 LAB — CBC WITH DIFFERENTIAL/PLATELET
BASOS ABS: 0 10*3/uL (ref 0.0–0.1)
BASOS PCT: 1 % (ref 0–1)
Eosinophils Absolute: 0.2 10*3/uL (ref 0.0–0.7)
Eosinophils Relative: 2 % (ref 0–5)
HCT: 38.3 % (ref 36.0–46.0)
Hemoglobin: 13.1 g/dL (ref 12.0–15.0)
Lymphocytes Relative: 23 % (ref 12–46)
Lymphs Abs: 1.5 10*3/uL (ref 0.7–4.0)
MCH: 33.2 pg (ref 26.0–34.0)
MCHC: 34.2 g/dL (ref 30.0–36.0)
MCV: 97.2 fL (ref 78.0–100.0)
MONO ABS: 0.9 10*3/uL (ref 0.1–1.0)
Monocytes Relative: 14 % — ABNORMAL HIGH (ref 3–12)
NEUTROS ABS: 4 10*3/uL (ref 1.7–7.7)
NEUTROS PCT: 60 % (ref 43–77)
Platelets: 187 10*3/uL (ref 150–400)
RBC: 3.94 MIL/uL (ref 3.87–5.11)
RDW: 15.4 % (ref 11.5–15.5)
WBC: 6.6 10*3/uL (ref 4.0–10.5)

## 2014-05-13 LAB — BASIC METABOLIC PANEL
ANION GAP: 10 (ref 5–15)
BUN: 38 mg/dL — ABNORMAL HIGH (ref 6–23)
CHLORIDE: 105 meq/L (ref 96–112)
CO2: 20 mEq/L (ref 19–32)
CREATININE: 1.15 mg/dL — AB (ref 0.50–1.10)
Calcium: 9.3 mg/dL (ref 8.4–10.5)
GFR calc non Af Amer: 43 mL/min — ABNORMAL LOW (ref >90)
GFR, EST AFRICAN AMERICAN: 50 mL/min — AB (ref >90)
Glucose, Bld: 87 mg/dL (ref 70–99)
POTASSIUM: 4.5 meq/L (ref 3.7–5.3)
SODIUM: 135 meq/L — AB (ref 137–147)

## 2014-05-13 LAB — URINE CULTURE
Colony Count: NO GROWTH
Culture: NO GROWTH

## 2014-05-14 LAB — TSH: TSH: 2.18 u[IU]/mL (ref 0.350–4.500)

## 2014-05-14 LAB — BASIC METABOLIC PANEL
ANION GAP: 12 (ref 5–15)
BUN: 35 mg/dL — ABNORMAL HIGH (ref 6–23)
CALCIUM: 9.2 mg/dL (ref 8.4–10.5)
CHLORIDE: 106 meq/L (ref 96–112)
CO2: 19 mEq/L (ref 19–32)
CREATININE: 1.14 mg/dL — AB (ref 0.50–1.10)
GFR calc Af Amer: 50 mL/min — ABNORMAL LOW (ref >90)
GFR calc non Af Amer: 43 mL/min — ABNORMAL LOW (ref >90)
Glucose, Bld: 80 mg/dL (ref 70–99)
Potassium: 4.6 mEq/L (ref 3.7–5.3)
Sodium: 137 mEq/L (ref 137–147)

## 2014-05-15 ENCOUNTER — Other Ambulatory Visit (HOSPITAL_COMMUNITY): Payer: Self-pay

## 2014-05-15 LAB — CLOSTRIDIUM DIFFICILE BY PCR: Toxigenic C. Difficile by PCR: POSITIVE — AB

## 2014-05-16 LAB — CBC WITH DIFFERENTIAL/PLATELET
BASOS ABS: 0 10*3/uL (ref 0.0–0.1)
BASOS PCT: 0 % (ref 0–1)
EOS ABS: 0.2 10*3/uL (ref 0.0–0.7)
EOS PCT: 3 % (ref 0–5)
HCT: 37.9 % (ref 36.0–46.0)
Hemoglobin: 12.9 g/dL (ref 12.0–15.0)
Lymphocytes Relative: 26 % (ref 12–46)
Lymphs Abs: 1.9 10*3/uL (ref 0.7–4.0)
MCH: 32.8 pg (ref 26.0–34.0)
MCHC: 34 g/dL (ref 30.0–36.0)
MCV: 96.4 fL (ref 78.0–100.0)
Monocytes Absolute: 1 10*3/uL (ref 0.1–1.0)
Monocytes Relative: 13 % — ABNORMAL HIGH (ref 3–12)
Neutro Abs: 4.3 10*3/uL (ref 1.7–7.7)
Neutrophils Relative %: 58 % (ref 43–77)
PLATELETS: 154 10*3/uL (ref 150–400)
RBC: 3.93 MIL/uL (ref 3.87–5.11)
RDW: 15.2 % (ref 11.5–15.5)
WBC: 7.3 10*3/uL (ref 4.0–10.5)

## 2014-05-16 LAB — COMPREHENSIVE METABOLIC PANEL
ALT: 44 U/L — ABNORMAL HIGH (ref 0–35)
AST: 76 U/L — AB (ref 0–37)
Albumin: 2.2 g/dL — ABNORMAL LOW (ref 3.5–5.2)
Alkaline Phosphatase: 189 U/L — ABNORMAL HIGH (ref 39–117)
Anion gap: 8 (ref 5–15)
BUN: 35 mg/dL — ABNORMAL HIGH (ref 6–23)
CALCIUM: 8.6 mg/dL (ref 8.4–10.5)
CO2: 20 mEq/L (ref 19–32)
Chloride: 105 mEq/L (ref 96–112)
Creatinine, Ser: 1.09 mg/dL (ref 0.50–1.10)
GFR calc Af Amer: 53 mL/min — ABNORMAL LOW (ref >90)
GFR calc non Af Amer: 46 mL/min — ABNORMAL LOW (ref >90)
Glucose, Bld: 90 mg/dL (ref 70–99)
Potassium: 4.4 mEq/L (ref 3.7–5.3)
SODIUM: 133 meq/L — AB (ref 137–147)
TOTAL PROTEIN: 6.6 g/dL (ref 6.0–8.3)
Total Bilirubin: 1.5 mg/dL — ABNORMAL HIGH (ref 0.3–1.2)

## 2014-05-16 LAB — PREALBUMIN: Prealbumin: 16 mg/dL — ABNORMAL LOW (ref 17.0–34.0)

## 2014-05-17 LAB — BASIC METABOLIC PANEL
ANION GAP: 9 (ref 5–15)
BUN: 35 mg/dL — ABNORMAL HIGH (ref 6–23)
CO2: 20 meq/L (ref 19–32)
Calcium: 9.5 mg/dL (ref 8.4–10.5)
Chloride: 105 mEq/L (ref 96–112)
Creatinine, Ser: 1.16 mg/dL — ABNORMAL HIGH (ref 0.50–1.10)
GFR calc Af Amer: 49 mL/min — ABNORMAL LOW (ref >90)
GFR, EST NON AFRICAN AMERICAN: 42 mL/min — AB (ref >90)
Glucose, Bld: 100 mg/dL — ABNORMAL HIGH (ref 70–99)
POTASSIUM: 4.7 meq/L (ref 3.7–5.3)
SODIUM: 134 meq/L — AB (ref 137–147)

## 2014-05-18 ENCOUNTER — Other Ambulatory Visit (HOSPITAL_COMMUNITY): Payer: Self-pay

## 2014-05-18 LAB — BASIC METABOLIC PANEL
Anion gap: 11 (ref 5–15)
BUN: 36 mg/dL — ABNORMAL HIGH (ref 6–23)
CHLORIDE: 104 meq/L (ref 96–112)
CO2: 20 meq/L (ref 19–32)
CREATININE: 1.12 mg/dL — AB (ref 0.50–1.10)
Calcium: 9.1 mg/dL (ref 8.4–10.5)
GFR calc Af Amer: 51 mL/min — ABNORMAL LOW (ref >90)
GFR calc non Af Amer: 44 mL/min — ABNORMAL LOW (ref >90)
Glucose, Bld: 87 mg/dL (ref 70–99)
Potassium: 4.3 mEq/L (ref 3.7–5.3)
Sodium: 135 mEq/L — ABNORMAL LOW (ref 137–147)

## 2014-05-18 LAB — CBC
HEMATOCRIT: 39.3 % (ref 36.0–46.0)
Hemoglobin: 13.5 g/dL (ref 12.0–15.0)
MCH: 32.8 pg (ref 26.0–34.0)
MCHC: 34.4 g/dL (ref 30.0–36.0)
MCV: 95.6 fL (ref 78.0–100.0)
PLATELETS: 167 10*3/uL (ref 150–400)
RBC: 4.11 MIL/uL (ref 3.87–5.11)
RDW: 15.5 % (ref 11.5–15.5)
WBC: 7 10*3/uL (ref 4.0–10.5)

## 2014-05-19 ENCOUNTER — Telehealth: Payer: Self-pay | Admitting: Hematology & Oncology

## 2014-05-19 LAB — OCCULT BLOOD X 1 CARD TO LAB, STOOL: Fecal Occult Bld: NEGATIVE

## 2014-05-19 LAB — ABO/RH: ABO/RH(D): O NEG

## 2014-05-19 LAB — PREPARE RBC (CROSSMATCH)

## 2014-05-19 LAB — HEMOGLOBIN AND HEMATOCRIT, BLOOD
HEMATOCRIT: 38 % (ref 36.0–46.0)
Hemoglobin: 12.9 g/dL (ref 12.0–15.0)

## 2014-05-19 NOTE — Telephone Encounter (Signed)
[employer] FMLA papers faxed today for pts son [name]).    F: [phone] P: [place] phone: [phone]      COPY SCANNED

## 2014-05-20 LAB — RENAL FUNCTION PANEL
Albumin: 2.2 g/dL — ABNORMAL LOW (ref 3.5–5.2)
Anion gap: 9 (ref 5–15)
BUN: 38 mg/dL — ABNORMAL HIGH (ref 6–23)
CALCIUM: 9 mg/dL (ref 8.4–10.5)
CO2: 22 meq/L (ref 19–32)
Chloride: 106 mEq/L (ref 96–112)
Creatinine, Ser: 1.15 mg/dL — ABNORMAL HIGH (ref 0.50–1.10)
GFR calc Af Amer: 50 mL/min — ABNORMAL LOW (ref >90)
GFR, EST NON AFRICAN AMERICAN: 43 mL/min — AB (ref >90)
Glucose, Bld: 95 mg/dL (ref 70–99)
Phosphorus: 2.9 mg/dL (ref 2.3–4.6)
Potassium: 4.8 mEq/L (ref 3.7–5.3)
SODIUM: 137 meq/L (ref 137–147)

## 2014-05-20 LAB — URINALYSIS, ROUTINE W REFLEX MICROSCOPIC
Bilirubin Urine: NEGATIVE
GLUCOSE, UA: NEGATIVE mg/dL
Ketones, ur: NEGATIVE mg/dL
NITRITE: NEGATIVE
Protein, ur: NEGATIVE mg/dL
SPECIFIC GRAVITY, URINE: 1.02 (ref 1.005–1.030)
Urobilinogen, UA: 0.2 mg/dL (ref 0.0–1.0)
pH: 5.5 (ref 5.0–8.0)

## 2014-05-20 LAB — HEMOGLOBIN AND HEMATOCRIT, BLOOD
HCT: 40.9 % (ref 36.0–46.0)
HEMATOCRIT: 37.1 % (ref 36.0–46.0)
HEMOGLOBIN: 13.6 g/dL (ref 12.0–15.0)
Hemoglobin: 12.7 g/dL (ref 12.0–15.0)

## 2014-05-20 LAB — URINE MICROSCOPIC-ADD ON

## 2014-05-21 LAB — CBC WITH DIFFERENTIAL/PLATELET
BASOS ABS: 0 10*3/uL (ref 0.0–0.1)
BASOS PCT: 0 % (ref 0–1)
EOS PCT: 2 % (ref 0–5)
Eosinophils Absolute: 0.1 10*3/uL (ref 0.0–0.7)
HEMATOCRIT: 37.3 % (ref 36.0–46.0)
Hemoglobin: 12.7 g/dL (ref 12.0–15.0)
Lymphocytes Relative: 20 % (ref 12–46)
Lymphs Abs: 1.6 10*3/uL (ref 0.7–4.0)
MCH: 32.8 pg (ref 26.0–34.0)
MCHC: 34 g/dL (ref 30.0–36.0)
MCV: 96.4 fL (ref 78.0–100.0)
MONO ABS: 0.9 10*3/uL (ref 0.1–1.0)
Monocytes Relative: 11 % (ref 3–12)
Neutro Abs: 5.5 10*3/uL (ref 1.7–7.7)
Neutrophils Relative %: 67 % (ref 43–77)
PLATELETS: 160 10*3/uL (ref 150–400)
RBC: 3.87 MIL/uL (ref 3.87–5.11)
RDW: 16.3 % — AB (ref 11.5–15.5)
WBC: 8.2 10*3/uL (ref 4.0–10.5)

## 2014-05-21 LAB — BASIC METABOLIC PANEL
ANION GAP: 8 (ref 5–15)
BUN: 38 mg/dL — ABNORMAL HIGH (ref 6–23)
CALCIUM: 9 mg/dL (ref 8.4–10.5)
CO2: 20 mEq/L (ref 19–32)
Chloride: 107 mEq/L (ref 96–112)
Creatinine, Ser: 1.1 mg/dL (ref 0.50–1.10)
GFR calc non Af Amer: 45 mL/min — ABNORMAL LOW (ref >90)
GFR, EST AFRICAN AMERICAN: 52 mL/min — AB (ref >90)
Glucose, Bld: 120 mg/dL — ABNORMAL HIGH (ref 70–99)
Potassium: 4.9 mEq/L (ref 3.7–5.3)
Sodium: 135 mEq/L — ABNORMAL LOW (ref 137–147)

## 2014-05-21 LAB — HEMOGLOBIN AND HEMATOCRIT, BLOOD
HEMATOCRIT: 36.9 % (ref 36.0–46.0)
HEMATOCRIT: 38.4 % (ref 36.0–46.0)
HEMOGLOBIN: 12.4 g/dL (ref 12.0–15.0)
HEMOGLOBIN: 12.9 g/dL (ref 12.0–15.0)

## 2014-05-21 LAB — URINE CULTURE

## 2014-05-22 ENCOUNTER — Other Ambulatory Visit (HOSPITAL_COMMUNITY): Payer: Self-pay

## 2014-05-23 LAB — TYPE AND SCREEN
ABO/RH(D): O NEG
Antibody Screen: NEGATIVE
Unit division: 0
Unit division: 0

## 2014-05-23 LAB — CBC WITH DIFFERENTIAL/PLATELET
BASOS ABS: 0 10*3/uL (ref 0.0–0.1)
BASOS PCT: 0 % (ref 0–1)
Eosinophils Absolute: 0 10*3/uL (ref 0.0–0.7)
Eosinophils Relative: 0 % (ref 0–5)
HEMATOCRIT: 38 % (ref 36.0–46.0)
Hemoglobin: 12.8 g/dL (ref 12.0–15.0)
Lymphocytes Relative: 17 % (ref 12–46)
Lymphs Abs: 1.4 10*3/uL (ref 0.7–4.0)
MCH: 33.2 pg (ref 26.0–34.0)
MCHC: 33.7 g/dL (ref 30.0–36.0)
MCV: 98.4 fL (ref 78.0–100.0)
MONO ABS: 0.8 10*3/uL (ref 0.1–1.0)
Monocytes Relative: 10 % (ref 3–12)
NEUTROS ABS: 6.1 10*3/uL (ref 1.7–7.7)
Neutrophils Relative %: 73 % (ref 43–77)
Platelets: 123 10*3/uL — ABNORMAL LOW (ref 150–400)
RBC: 3.86 MIL/uL — ABNORMAL LOW (ref 3.87–5.11)
RDW: 16.5 % — AB (ref 11.5–15.5)
WBC: 8.4 10*3/uL (ref 4.0–10.5)

## 2014-05-23 LAB — COMPREHENSIVE METABOLIC PANEL
ALT: 53 U/L — ABNORMAL HIGH (ref 0–35)
AST: 73 U/L — AB (ref 0–37)
Albumin: 2.3 g/dL — ABNORMAL LOW (ref 3.5–5.2)
Alkaline Phosphatase: 199 U/L — ABNORMAL HIGH (ref 39–117)
Anion gap: 10 (ref 5–15)
BILIRUBIN TOTAL: 0.8 mg/dL (ref 0.3–1.2)
BUN: 42 mg/dL — ABNORMAL HIGH (ref 6–23)
CALCIUM: 9.1 mg/dL (ref 8.4–10.5)
CHLORIDE: 106 meq/L (ref 96–112)
CO2: 21 mEq/L (ref 19–32)
CREATININE: 1.14 mg/dL — AB (ref 0.50–1.10)
GFR calc Af Amer: 50 mL/min — ABNORMAL LOW (ref >90)
GFR calc non Af Amer: 43 mL/min — ABNORMAL LOW (ref >90)
Glucose, Bld: 111 mg/dL — ABNORMAL HIGH (ref 70–99)
Potassium: 4.7 mEq/L (ref 3.7–5.3)
Sodium: 137 mEq/L (ref 137–147)
Total Protein: 6.6 g/dL (ref 6.0–8.3)

## 2014-05-23 LAB — MAGNESIUM: MAGNESIUM: 2.1 mg/dL (ref 1.5–2.5)

## 2014-05-23 LAB — PHOSPHORUS: PHOSPHORUS: 3.1 mg/dL (ref 2.3–4.6)

## 2014-05-23 LAB — PREALBUMIN: Prealbumin: 18.6 mg/dL (ref 17.0–34.0)

## 2014-05-25 ENCOUNTER — Other Ambulatory Visit (HOSPITAL_COMMUNITY): Payer: Self-pay

## 2014-05-26 LAB — CBC WITH DIFFERENTIAL/PLATELET
BASOS ABS: 0 10*3/uL (ref 0.0–0.1)
Basophils Relative: 0 % (ref 0–1)
EOS ABS: 0.1 10*3/uL (ref 0.0–0.7)
EOS PCT: 2 % (ref 0–5)
HCT: 39.1 % (ref 36.0–46.0)
Hemoglobin: 13.3 g/dL (ref 12.0–15.0)
Lymphocytes Relative: 18 % (ref 12–46)
Lymphs Abs: 1.5 10*3/uL (ref 0.7–4.0)
MCH: 32.8 pg (ref 26.0–34.0)
MCHC: 34 g/dL (ref 30.0–36.0)
MCV: 96.3 fL (ref 78.0–100.0)
Monocytes Absolute: 1 10*3/uL (ref 0.1–1.0)
Monocytes Relative: 12 % (ref 3–12)
Neutro Abs: 5.5 10*3/uL (ref 1.7–7.7)
Neutrophils Relative %: 68 % (ref 43–77)
PLATELETS: 128 10*3/uL — AB (ref 150–400)
RBC: 4.06 MIL/uL (ref 3.87–5.11)
RDW: 16.6 % — AB (ref 11.5–15.5)
WBC: 8.1 10*3/uL (ref 4.0–10.5)

## 2014-05-26 LAB — BASIC METABOLIC PANEL
ANION GAP: 12 (ref 5–15)
BUN: 36 mg/dL — ABNORMAL HIGH (ref 6–23)
CALCIUM: 8.5 mg/dL (ref 8.4–10.5)
CO2: 20 mEq/L (ref 19–32)
CREATININE: 1.1 mg/dL (ref 0.50–1.10)
Chloride: 105 mEq/L (ref 96–112)
GFR calc Af Amer: 52 mL/min — ABNORMAL LOW (ref >90)
GFR, EST NON AFRICAN AMERICAN: 45 mL/min — AB (ref >90)
Glucose, Bld: 98 mg/dL (ref 70–99)
Potassium: 3.9 mEq/L (ref 3.7–5.3)
Sodium: 137 mEq/L (ref 137–147)

## 2014-05-26 LAB — PHOSPHORUS: Phosphorus: 2.9 mg/dL (ref 2.3–4.6)

## 2014-05-26 LAB — MAGNESIUM: Magnesium: 2.2 mg/dL (ref 1.5–2.5)

## 2014-05-30 LAB — COMPREHENSIVE METABOLIC PANEL
ALBUMIN: 2.3 g/dL — AB (ref 3.5–5.2)
ALK PHOS: 213 U/L — AB (ref 39–117)
ALT: 54 U/L — ABNORMAL HIGH (ref 0–35)
AST: 67 U/L — ABNORMAL HIGH (ref 0–37)
Anion gap: 9 (ref 5–15)
BUN: 42 mg/dL — ABNORMAL HIGH (ref 6–23)
CALCIUM: 8.5 mg/dL (ref 8.4–10.5)
CO2: 22 mEq/L (ref 19–32)
Chloride: 105 mEq/L (ref 96–112)
Creatinine, Ser: 1.01 mg/dL (ref 0.50–1.10)
GFR calc non Af Amer: 50 mL/min — ABNORMAL LOW (ref >90)
GFR, EST AFRICAN AMERICAN: 58 mL/min — AB (ref >90)
Glucose, Bld: 116 mg/dL — ABNORMAL HIGH (ref 70–99)
POTASSIUM: 4.5 meq/L (ref 3.7–5.3)
Sodium: 136 mEq/L — ABNORMAL LOW (ref 137–147)
Total Bilirubin: 0.9 mg/dL (ref 0.3–1.2)
Total Protein: 6.3 g/dL (ref 6.0–8.3)

## 2014-05-30 LAB — PREALBUMIN: PREALBUMIN: 19.2 mg/dL (ref 17.0–34.0)

## 2014-05-30 LAB — CBC WITH DIFFERENTIAL/PLATELET
BASOS PCT: 0 % (ref 0–1)
Basophils Absolute: 0 10*3/uL (ref 0.0–0.1)
EOS ABS: 0.2 10*3/uL (ref 0.0–0.7)
EOS PCT: 3 % (ref 0–5)
HCT: 41.2 % (ref 36.0–46.0)
HEMOGLOBIN: 13.8 g/dL (ref 12.0–15.0)
Lymphocytes Relative: 17 % (ref 12–46)
Lymphs Abs: 1.5 10*3/uL (ref 0.7–4.0)
MCH: 33.5 pg (ref 26.0–34.0)
MCHC: 33.5 g/dL (ref 30.0–36.0)
MCV: 100 fL (ref 78.0–100.0)
MONOS PCT: 12 % (ref 3–12)
Monocytes Absolute: 1.1 10*3/uL — ABNORMAL HIGH (ref 0.1–1.0)
NEUTROS PCT: 69 % (ref 43–77)
Neutro Abs: 6.2 10*3/uL (ref 1.7–7.7)
Platelets: 113 10*3/uL — ABNORMAL LOW (ref 150–400)
RBC: 4.12 MIL/uL (ref 3.87–5.11)
RDW: 16.5 % — ABNORMAL HIGH (ref 11.5–15.5)
WBC: 9 10*3/uL (ref 4.0–10.5)

## 2014-05-30 LAB — URINALYSIS, ROUTINE W REFLEX MICROSCOPIC
Bilirubin Urine: NEGATIVE
Glucose, UA: NEGATIVE mg/dL
HGB URINE DIPSTICK: NEGATIVE
Ketones, ur: NEGATIVE mg/dL
LEUKOCYTES UA: NEGATIVE
NITRITE: NEGATIVE
PROTEIN: NEGATIVE mg/dL
SPECIFIC GRAVITY, URINE: 1.021 (ref 1.005–1.030)
UROBILINOGEN UA: 0.2 mg/dL (ref 0.0–1.0)
pH: 5.5 (ref 5.0–8.0)

## 2014-05-31 LAB — BASIC METABOLIC PANEL
ANION GAP: 9 (ref 5–15)
BUN: 45 mg/dL — AB (ref 6–23)
CHLORIDE: 106 meq/L (ref 96–112)
CO2: 22 mEq/L (ref 19–32)
Calcium: 8.6 mg/dL (ref 8.4–10.5)
Creatinine, Ser: 1.07 mg/dL (ref 0.50–1.10)
GFR, EST AFRICAN AMERICAN: 54 mL/min — AB (ref >90)
GFR, EST NON AFRICAN AMERICAN: 47 mL/min — AB (ref >90)
Glucose, Bld: 100 mg/dL — ABNORMAL HIGH (ref 70–99)
POTASSIUM: 4.5 meq/L (ref 3.7–5.3)
Sodium: 137 mEq/L (ref 137–147)

## 2014-06-01 ENCOUNTER — Other Ambulatory Visit (HOSPITAL_COMMUNITY): Payer: Self-pay

## 2014-06-01 LAB — CLOSTRIDIUM DIFFICILE BY PCR: Toxigenic C. Difficile by PCR: NEGATIVE

## 2014-06-02 ENCOUNTER — Other Ambulatory Visit (HOSPITAL_COMMUNITY): Payer: Self-pay

## 2014-06-02 LAB — CBC WITH DIFFERENTIAL/PLATELET
BASOS PCT: 0 % (ref 0–1)
Basophils Absolute: 0 10*3/uL (ref 0.0–0.1)
EOS PCT: 1 % (ref 0–5)
Eosinophils Absolute: 0.1 10*3/uL (ref 0.0–0.7)
HEMATOCRIT: 43.7 % (ref 36.0–46.0)
Hemoglobin: 14.7 g/dL (ref 12.0–15.0)
Lymphocytes Relative: 16 % (ref 12–46)
Lymphs Abs: 1.5 10*3/uL (ref 0.7–4.0)
MCH: 33.6 pg (ref 26.0–34.0)
MCHC: 33.6 g/dL (ref 30.0–36.0)
MCV: 99.8 fL (ref 78.0–100.0)
MONO ABS: 0.8 10*3/uL (ref 0.1–1.0)
Monocytes Relative: 9 % (ref 3–12)
NEUTROS ABS: 6.8 10*3/uL (ref 1.7–7.7)
Neutrophils Relative %: 74 % (ref 43–77)
PLATELETS: 107 10*3/uL — AB (ref 150–400)
RBC: 4.38 MIL/uL (ref 3.87–5.11)
RDW: 17.2 % — ABNORMAL HIGH (ref 11.5–15.5)
WBC: 9.3 10*3/uL (ref 4.0–10.5)

## 2014-06-02 LAB — BASIC METABOLIC PANEL
ANION GAP: 12 (ref 5–15)
BUN: 44 mg/dL — ABNORMAL HIGH (ref 6–23)
CALCIUM: 8.9 mg/dL (ref 8.4–10.5)
CO2: 21 mEq/L (ref 19–32)
CREATININE: 1.07 mg/dL (ref 0.50–1.10)
Chloride: 105 mEq/L (ref 96–112)
GFR calc non Af Amer: 47 mL/min — ABNORMAL LOW (ref >90)
GFR, EST AFRICAN AMERICAN: 54 mL/min — AB (ref >90)
Glucose, Bld: 102 mg/dL — ABNORMAL HIGH (ref 70–99)
Potassium: 4.7 mEq/L (ref 3.7–5.3)
SODIUM: 138 meq/L (ref 137–147)

## 2014-06-03 DIAGNOSIS — J90 Pleural effusion, not elsewhere classified: Secondary | ICD-10-CM

## 2014-06-03 NOTE — Progress Notes (Signed)
  Subjective: Patient examined, CT scan of abdomen and chest as well as echocardiogram reviewed. Asked to evaluate patient regarding right pleural effusion for which a Pleurx catheter was placed about 4 weeks ago. The catheter is functioning well and is draining 500 mL is to 1 L daily. The patient has experienced some pain with the drainage sessions after more than 1 L is removed. The last chest x-ray shows the catheter to be in good position and there is a mild right pleural effusion.  The pleural fluid has a negative cytology. There is no evidence of infection. The patient symptomatically has responded well to the Pleurx catheter without shortness of breath. Prior to the Pleurx catheter placement she required multiple thoracentesis procedures. Her last ultrasound and CT scan of the abdomen shows only mild ascites.  The etiology right pleural effusion is secondary to hepatic disease--she has significant cirrhosis as seen by a small nodular liver on CT scan with abdominal varices noted.  Echocardiogram demonstrates good biventricular function, no pericardial effusion and only mild tricuspid regurgitation.  The patient had a hepatoma previously treated with radiofrequency ablation by interventional radiology with fairly good response does not followup scans. Her LFTs are mildly elevated. Her pre= albumin is normal.  Objective: Vital signs in last 24 hours:   patient is in a sinus rhythm and is afebrile  Hemodynamic parameters for last 24 hours:   stable  Intake/Output from previous day:   last Pleurx drainage was 1 L Intake/Output this shift:   Not recorded  Exam Elderly female breathing comfortable in bed supine and 30 angle HEENT-nonicteric sclera dentition good Neck-mild JVD Thorax-right Pleurx catheter in good position no signs of infection breath sounds clear Cardiac regular rhythm without murmur Abdomen mildly distended Neuro alert and responsive moves all extremities but  weak   Lab Results:  Recent Labs  06/02/14 0635  WBC 9.3  HGB 14.7  HCT 43.7  PLT 107*   BMET:  Recent Labs  06/02/14 0635  NA 138  K 4.7  CL 105  CO2 21  GLUCOSE 102*  BUN 44*  CREATININE 1.07  CALCIUM 8.9    PT/INR: No results found for this basename: LABPROT, INR,  in the last 72 hours ABG    Component Value Date/Time   HCO3 25.0* 06/08/2007 1806   TCO2 26 06/08/2007 1806   ACIDBASEDEF 2.0 06/08/2007 1806   CBG (last 3)  No results found for this basename: GLUCAP,  in the last 72 hours  Assessment/Plan: I'll recommend leaving the Pleurx catheter in place. The catheter has achieved a good symptomatic response and the patient's nutritional status remains good. No evidence of hepatio- renal syndrome.  The patient would not tolerate general anesthesia with VATS and talc pleurodesiis because of her advanced stage cirrhosis. With significant cirrhosis talc pleurodesis has a low expectoration of success for recurrent right pleural effusion.   LOS: 23 days    VAN TRIGT III,[name] 06/03/2014

## 2014-06-06 ENCOUNTER — Other Ambulatory Visit (HOSPITAL_COMMUNITY): Payer: Self-pay

## 2014-06-06 LAB — CBC WITH DIFFERENTIAL/PLATELET
BASOS ABS: 0 10*3/uL (ref 0.0–0.1)
Basophils Relative: 0 % (ref 0–1)
Eosinophils Absolute: 0.1 10*3/uL (ref 0.0–0.7)
Eosinophils Relative: 1 % (ref 0–5)
HCT: 44.7 % (ref 36.0–46.0)
Hemoglobin: 15.2 g/dL — ABNORMAL HIGH (ref 12.0–15.0)
LYMPHS PCT: 22 % (ref 12–46)
Lymphs Abs: 2.3 10*3/uL (ref 0.7–4.0)
MCH: 33.9 pg (ref 26.0–34.0)
MCHC: 34 g/dL (ref 30.0–36.0)
MCV: 99.6 fL (ref 78.0–100.0)
Monocytes Absolute: 0.9 10*3/uL (ref 0.1–1.0)
Monocytes Relative: 8 % (ref 3–12)
NEUTROS ABS: 7.2 10*3/uL (ref 1.7–7.7)
Neutrophils Relative %: 69 % (ref 43–77)
Platelets: 94 10*3/uL — ABNORMAL LOW (ref 150–400)
RBC: 4.49 MIL/uL (ref 3.87–5.11)
RDW: 17.3 % — AB (ref 11.5–15.5)
WBC: 10.5 10*3/uL (ref 4.0–10.5)

## 2014-06-06 LAB — URINALYSIS, ROUTINE W REFLEX MICROSCOPIC
BILIRUBIN URINE: NEGATIVE
GLUCOSE, UA: NEGATIVE mg/dL
Ketones, ur: NEGATIVE mg/dL
Nitrite: NEGATIVE
PH: 5.5 (ref 5.0–8.0)
Protein, ur: NEGATIVE mg/dL
SPECIFIC GRAVITY, URINE: 1.022 (ref 1.005–1.030)
Urobilinogen, UA: 0.2 mg/dL (ref 0.0–1.0)

## 2014-06-06 LAB — COMPREHENSIVE METABOLIC PANEL
ALK PHOS: 256 U/L — AB (ref 39–117)
ALT: 72 U/L — ABNORMAL HIGH (ref 0–35)
AST: 78 U/L — AB (ref 0–37)
Albumin: 2.4 g/dL — ABNORMAL LOW (ref 3.5–5.2)
Anion gap: 12 (ref 5–15)
BILIRUBIN TOTAL: 1.3 mg/dL — AB (ref 0.3–1.2)
BUN: 48 mg/dL — ABNORMAL HIGH (ref 6–23)
CHLORIDE: 106 meq/L (ref 96–112)
CO2: 23 mEq/L (ref 19–32)
Calcium: 8.9 mg/dL (ref 8.4–10.5)
Creatinine, Ser: 1.2 mg/dL — ABNORMAL HIGH (ref 0.50–1.10)
GFR calc Af Amer: 47 mL/min — ABNORMAL LOW (ref >90)
GFR calc non Af Amer: 41 mL/min — ABNORMAL LOW (ref >90)
Glucose, Bld: 114 mg/dL — ABNORMAL HIGH (ref 70–99)
POTASSIUM: 4.8 meq/L (ref 3.7–5.3)
Sodium: 141 mEq/L (ref 137–147)
Total Protein: 6.5 g/dL (ref 6.0–8.3)

## 2014-06-06 LAB — URINE MICROSCOPIC-ADD ON

## 2014-06-06 LAB — PREALBUMIN: PREALBUMIN: 22.7 mg/dL (ref 17.0–34.0)

## 2014-06-07 ENCOUNTER — Other Ambulatory Visit (HOSPITAL_COMMUNITY): Payer: Medicare Other

## 2014-06-09 ENCOUNTER — Encounter: Payer: Self-pay | Admitting: Internal Medicine

## 2014-06-09 ENCOUNTER — Non-Acute Institutional Stay (SKILLED_NURSING_FACILITY): Payer: Medicare Other | Admitting: Internal Medicine

## 2014-06-09 DIAGNOSIS — R5381 Other malaise: Secondary | ICD-10-CM

## 2014-06-09 DIAGNOSIS — R5383 Other fatigue: Secondary | ICD-10-CM

## 2014-06-09 DIAGNOSIS — J90 Pleural effusion, not elsewhere classified: Secondary | ICD-10-CM

## 2014-06-09 DIAGNOSIS — B370 Candidal stomatitis: Secondary | ICD-10-CM

## 2014-06-09 DIAGNOSIS — T83511S Infection and inflammatory reaction due to indwelling urethral catheter, sequela: Secondary | ICD-10-CM

## 2014-06-09 DIAGNOSIS — E43 Unspecified severe protein-calorie malnutrition: Secondary | ICD-10-CM

## 2014-06-09 DIAGNOSIS — C220 Liver cell carcinoma: Secondary | ICD-10-CM

## 2014-06-09 DIAGNOSIS — R531 Weakness: Secondary | ICD-10-CM

## 2014-06-09 DIAGNOSIS — C228 Malignant neoplasm of liver, primary, unspecified as to type: Secondary | ICD-10-CM

## 2014-06-09 DIAGNOSIS — I10 Essential (primary) hypertension: Secondary | ICD-10-CM

## 2014-06-09 DIAGNOSIS — A0472 Enterocolitis due to Clostridium difficile, not specified as recurrent: Secondary | ICD-10-CM

## 2014-06-09 DIAGNOSIS — T83511A Infection and inflammatory reaction due to indwelling urethral catheter, initial encounter: Secondary | ICD-10-CM

## 2014-06-09 DIAGNOSIS — N390 Urinary tract infection, site not specified: Secondary | ICD-10-CM

## 2014-06-09 LAB — URINE CULTURE

## 2014-06-09 NOTE — Progress Notes (Signed)
Patient ID: [name], female   DOB: 1927/12/20, 80 y.o.   MRN: [id number]     Facility: [facility]   PCP: Alesia Richards, MD   Allergies  Allergen Reactions  . Avelox [Moxifloxacin Hcl In Nacl] Anaphylaxis  . Statins Other (See Comments)    Liver function elevations  . Sulfa Antibiotics Hives and Itching  . Ace Inhibitors     Unknown  . Doxycycline     Unknown  . Infed [Iron Dextran] Other (See Comments)    IV IRON, Unknown  . Infergen [Interferon Alfacon-1]   . Vancomycin Other (See Comments)    Flushing, erythema to neck and face, tolerated vanc at lower infusion rate  . Ciprofloxacin Other (See Comments)    Complains of sore mouth  . Fenofibrate Other (See Comments)    constipation    Chief Complaint: new admit  HPI:  80 y/o female patient is here for STR after hospital admission from 04/25/14-05/11/14 with acute respiratory failure in setting of recurrent right sided pleural effusion and had pleurx placed. cytology and cultures were negative. She was also treated for c.diff colitis and put on TNA for nutritional support until 05/10/14. She was started on treatment for oral thrush in hospital. She was also treated for cellulitis in her left leg. Oncology and IR were consulted. After this she was sent to [facility] and was there from 05/11/14- 06/08/14. She was diagnosed with UTI and has been on ceftriaxone on discharge from [facility] She has history of hypertension, MI, TIA, hepatoma with NASH s/p radiofrequency ablation of hepatoma She is seen in her room today. She complaints of pain in right chest area on draining of the tube and mid back pain on right side. As per staff, when offered pain med pt does not take it.  Review of Systems:  Constitutional: Negative for fever, chills, diaphoresis. feels tired HENT: Negative for congestion, hearing loss and sore throat.   Eyes: Negative for eye pain, blurred vision, double vision and discharge.  Respiratory:  Negative for cough, sputum production, wheezing.   Cardiovascular: Negative for chest pain, palpitations, orthopnea and leg swelling.  Gastrointestinal: Negative for heartburn, nausea, vomiting, abdominal pain, diarrhea and constipation.  Genitourinary: negative for flank pain. has a foley in place Musculoskeletal: Negative for falls, joint pain and myalgias.  Skin: Negative for itching and rash.  Neurological: Negative for dizziness, focal weakness and headaches.  Psychiatric/Behavioral: Negative for depression.     Past Medical History  Diagnosis Date  . Anemia, iron deficiency 08/07/2011  . Angiodysplasia of intestinal tract 08/07/2011  . Hypertension   . Arthritis   . Diverticulitis   . Phlebitis   . Macular degeneration   . TIA (transient ischemic attack)   . MI (myocardial infarction)   . Hiatal hernia   . AVM (arteriovenous malformation) of colon with hemorrhage   . Varicose veins of legs   . Cataract   . GERD (gastroesophageal reflux disease)   . DJD (degenerative joint disease)   . Diabetes mellitus     borderline  . History of blood transfusion   . PONV (postoperative nausea and vomiting)   . hepatocellar ca dx'd 08/2012  . Edema leg   . Fatty liver   . Vitamin D deficiency    Past Surgical History  Procedure Laterality Date  . 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 Abdominal hysterectomy    . Hemorrhoid surgery    . Esophagogastroduodenoscopy  08/30/2012  Procedure: ESOPHAGOGASTRODUODENOSCOPY (EGD);  Surgeon: Jeryl Columbia, MD;  Location: [facility];  Service: Endoscopy;  Laterality: N/A;  . Radiofrequency ablation liver tumor Right 01/2013    HFA ablation right liver lesion  . Colonoscopy  06/2009    Dr. Watt Climes, due 5 years  . Endoscopic vein laser treatment Left 2014    Dr. Kellie Simmering   Social History:   reports that she quit smoking about 23 years ago. Her smoking use included Cigarettes. She started smoking about 63 years ago. She has a 200  pack-year smoking history. She has never used smokeless tobacco. She reports that she does not drink alcohol or use illicit drugs.  Family History  Problem Relation Age of Onset  . Colon cancer Sister   . Coronary artery disease Father   . Heart disease Father     Medications: Patient's Medications  New Prescriptions   No medications on file  Previous Medications   ALPRAZOLAM (XANAX) 1 MG TABLET    Take 0.5 tablets (0.5 mg total) by mouth at bedtime as needed for sleep or anxiety.   CEFTRIAXONE 1 G IN DEXTROSE 5 % 50 ML    Inject 1 g into the vein daily. Until 06/11/14   CHOLECALCIFEROL (VITAMIN D) 1000 UNITS TABLET    Take 1,000 Units by mouth daily.   FLUCONAZOLE (DIFLUCAN) 200 MG TABLET    Take 200 mg by mouth daily.   IPRATROPIUM (ATROVENT) 0.02 % NEBULIZER SOLUTION    Take 2.5 mLs (0.5 mg total) by nebulization every 6 (six) hours as needed for wheezing or shortness of breath.   LEVALBUTEROL (XOPENEX) 1.25 MG/0.5ML NEBULIZER SOLUTION    Take 1.25 mg by nebulization every 6 (six) hours as needed for wheezing or shortness of breath.   MEGESTROL (MEGACE) 40 MG/ML SUSPENSION    Take 10 mLs (400 mg total) by mouth 2 (two) times daily.   MIRTAZAPINE (REMERON) 15 MG TABLET    Take 15 mg by mouth at bedtime.   MULTIPLE VITAMIN (MULTIVITAMIN WITH MINERALS) TABS TABLET    Take 1 tablet by mouth daily.   NYSTATIN (MYCOSTATIN) 100000 UNIT/ML SUSPENSION    Take 5 mLs (500,000 Units total) by mouth 4 (four) times daily.   OXYCODONE (OXY-IR) 5 MG CAPSULE    Take 5 mg by mouth every 6 (six) hours as needed.   PANTOPRAZOLE (PROTONIX) 40 MG TABLET    Take 40 mg by mouth 2 (two) times daily.   SACCHAROMYCES BOULARDII (FLORASTOR) 250 MG CAPSULE    Take 1 capsule (250 mg total) by mouth 2 (two) times daily.  Modified Medications   No medications on file  Discontinued Medications   ATENOLOL (TENORMIN) 50 MG TABLET    Take 1 tablet (50 mg total) by mouth daily.   BENZONATATE (TESSALON) 100 MG CAPSULE     Take 1 capsule (100 mg total) by mouth 2 (two) times daily as needed for cough.   CYCLOBENZAPRINE (FLEXERIL) 10 MG TABLET    Take 10 mg by mouth 2 (two) times daily as needed for muscle spasms.    HYDROCODONE-ACETAMINOPHEN (NORCO/VICODIN) 5-325 MG PER TABLET    Take 1-2 tablets by mouth every 4 (four) hours as needed for moderate pain.   LACTOSE FREE NUTRITION (BOOST PLUS) LIQD    Take 237 mLs by mouth 2 (two) times daily between meals.   LIP BALM (CARMEX) OINTMENT    Apply topically as needed for lip care.   METRONIDAZOLE (FLAGYL) 500 MG TABLET    Take  1 tablet (500 mg total) by mouth every 8 (eight) hours.   MORPHINE 2 MG/ML INJECTION    Inject 0.5 mLs (1 mg total) into the vein every 3 (three) hours as needed.   NITROSTAT 0.4 MG SL TABLET    Place 0.4 mg under the tongue every 5 (five) minutes as needed for chest pain.    PANCRELIPASE, LIP-PROT-AMYL, (CREON) 24000 UNITS CPEP    Take 1 capsule (24,000 Units total) by mouth 3 (three) times daily before meals.   TRIAMCINOLONE CREAM (KENALOG) 0.1 %    Apply 1 application topically 2 (two) times daily as needed. Eczema   VANCOMYCIN (VANCOCIN) 750 MG/150ML SOLN    Inject 150 mLs (750 mg total) into the vein daily.     Physical Exam: Filed Vitals:   06/09/14 1353  BP: 116/62  Pulse: 80  Temp: 98.1 F (36.7 C)  Resp: 18  Height: 5\' 5"  (1.651 m)  Weight: 162 lb (73.483 kg)  SpO2: 93%    General- elderly female in no acute distress, frail Head- atraumatic, normocephalic Eyes- PERRLA, EOMI, no pallor, no icterus, no discharge Neck- no lymphadenopathy Throat- moist mucus membrane, no thrush noted Cardiovascular- normal s1,s2, no murmurs Respiratory- bilateral clear to auscultation, no wheeze, no rhonchi, no crackles, no use of accessory muscles, right sided pleurx in place Abdomen- bowel sounds present, soft, non tender Musculoskeletal- able to move all 4 extremities, no spinal and paraspinal tenderness, no leg edema, weakness  generalized Neurological- no focal deficit Skin- warm and dry Psychiatry- alert and oriented, normal mood and affect   Labs reviewed: Basic Metabolic Panel:  Recent Labs  05/12/14 0630  05/20/14 0728  05/23/14 0535 05/26/14 0640  06/02/14 0635 06/06/14 0710 06/10/14 0640  NA 136*  < > 137  < > 137 137  < > 138 141 141  K 4.4  < > 4.8  < > 4.7 3.9  < > 4.7 4.8 4.6  CL 105  < > 106  < > 106 105  < > 105 106 109  CO2 19  < > 22  < > 21 20  < > 21 23 20   GLUCOSE 87  < > 95  < > 111* 98  < > 102* 114* 111*  BUN 42*  < > 38*  < > 42* 36*  < > 44* 48* 51*  CREATININE 1.14*  < > 1.15*  < > 1.14* 1.10  < > 1.07 1.20* 1.20*  CALCIUM 9.3  < > 9.0  < > 9.1 8.5  < > 8.9 8.9 9.4  MG 2.0  --   --   --  2.1 2.2  --   --   --   --   PHOS 2.9  --  2.9  --  3.1 2.9  --   --   --   --   < > = values in this interval not displayed. Liver Function Tests:  Recent Labs  05/30/14 0340 06/06/14 0710 06/10/14 0640  AST 67* 78* 78*  ALT 54* 72* 71*  ALKPHOS 213* 256* 246*  BILITOT 0.9 1.3* 0.8  PROT 6.3 6.5 6.4  ALBUMIN 2.3* 2.4* 2.5*    Recent Labs  04/25/14 1706  LIPASE 53   No results found for this basename: AMMONIA,  in the last 8760 hours CBC:  Recent Labs  06/02/14 0635 06/06/14 0710 06/10/14 0640  WBC 9.3 10.5 10.8*  NEUTROABS 6.8 7.2 8.1*  HGB 14.7 15.2* 14.8  HCT 43.7 44.7  42.6  MCV 99.8 99.6 101.6*  PLT 107* 94* 90*   Cardiac Enzymes:  Recent Labs  09/05/13 0642 04/17/14 1121 04/25/14 1706  TROPONINI <0.30 <0.30 <0.30   BNP: No components found with this basename: POCBNP,  CBG:  Recent Labs  05/10/14 0759 05/10/14 1208 05/10/14 1813  GLUCAP 103* 116* 121*    Radiological Exams:  Dg Chest 2 View 04/25/2014 IMPRESSION: No acute cardiopulmonary process. Cardiomegaly with mild central vascular congestion reidentified.    Dg Lumbar Spine Complete 04/25/2014 IMPRESSION: No acute osseous abnormality of the lumbar spine    Dg Pelvis 1-2 Views8/11/2013  IMPRESSION: Negative.    Ct Head Wo Contrast 04/25/2014 IMPRESSION: CT head: No acute intracranial process. Normal noncontrast CT of the head for age. CT cervical spine: Straightened cervical lordosis without acute fracture. Grade 1 C5-6 anterolisthesis on degenerative basis.    Ct Cervical Spine Wo Contrast 04/25/2014 IMPRESSION: CT head: No acute intracranial process. Normal noncontrast CT of the head for age. CT cervical spine: Straightened cervical lordosis without acute fracture. Grade 1 C5-6 anterolisthesis on degenerative basis.    Dg Chest [facility] 04/25/2014 IMPRESSION: Retrocardiac opacity which could represent atelectasis given the degree of hypoaeration although pneumonia could appear similar. If symptoms persist, consider PA and lateral chest radiographs obtained at full inspiration when the patient is clinically able.    CT abdomen 04/28/2014 Morphologic features of the liver compatible with cirrhosis with stigmata of portal venous hypertension. 2. Increase in abdominal ascites. 3. Hepatoma site within the segment 6 of the liver is slightly increased in size from previous exam. 4. Large right pleural effusion. 5. Large hiatal hernia 6. Atherosclerotic disease 7. Prominent retroperitoneal adenopathy is nonspecific in the setting of cirrhosis and portal venous hypertension. This appears similar to previous exam. 8. Wall thickening of the colon is also nonspecific in the setting of portal venous hypertension. This may be due to a hypoproteinemic state or colitis.    Dg Chest 04/29/2014 Marked improvement RIGHT pleural effusion. No pneumothorax.    Dg Chest [facility] 04/29/2014 Significant increase in a right-sided pleural effusion when compared with the prior exam. Hiatal hernia.    US Thoracentesis Asp Pleural Space W/img Guide 04/29/2014 Successful ultrasound guided diagnostic and therapeutic right thoracentesis yielding 1.2 liters of pleural fluid.    TPN 04/30/2014    Dg Chest Port 05/01/2014 1.Interval  placement of right upper extremity PICC with tip terminating in the proximal superior vena cava. 2. Interval accumulation of a moderate to large right-sided pleural effusion. There is also a small left pleural effusion. 3. Bibasilar atelectasis and/or consolidation.    US Thoracentesis Asp Pleural Space W/img Guide 05/02/2014 Successful ultrasound guided diagnostic and therapeutic right thoracentesis yielding 1.1 liters of pleural fluid.    Dg Chest 05/02/2014 Resolution of right pleural effusion, without pneumothorax. Bibasilar airspace disease which could represent atelectasis or infection.    US Abdomen 05/02/2014 No significant amount of ascites to perform a therapeutic paracentesis.    DG abdomen 05/03/2014 - no bowel obstruction, small bilateral pleural effusion, on right seems to be resolving; possible interstitial edema    US Thoracentesis Pleural Space 05/04/2014 Successful ultrasound guided therapeutic right thoracentesis yielding 900 cc's of pleural fluid.    CT chest w/o contrast 05/05/2014 - moderate right pleural effusion, no frank interstitial edema; cirrhosis    DG chest 05/05/2014 - pleural effusions and basilar atelectasis larger on left. Hiatal hernia.    2-D echocardiogram 05/05/14: EF 65-70%. No regional wall motion abnormalities. Grade 1  diastolic dysfunction.    US Abdomen Limited 05/06/2014 No significant amount of ascites to perform paracentesis.    Dg Chest Port 1 View 05/07/2014 Worsening RIGHT pleural effusion.    Dg Chest Port 1 View 05/08/14: No significant change.  Ultrasound guided placement of Pleurx catheter 05/09/14: Successful placement of a tunneled right-sided pleural drain. Approximately 1.8 L of clear straw-colored pleural fluid was aspirated following drain placement. Patient should continue to drain 1 bottle daily until fluid reaccumulation decreases.   Assessment/Plan  Generalized weakness From deconditioning with her prolonged hospital and LTAC stay. Will  have pt work with physical therapy and occupational therapy team to help with gait training and muscle strengthening exercises.fall precautions. Skin care. Encourage to be out of bed.   Right sided pleural effusion had a total of 4 thoracenteses since admission (cytology/cultures negative), is now s/p Pleurx catheter drained daily. Advised her on taking pain med prior to drainage and will have her on tylenol 650 mg q8 scheduled to help with pain. Continue prn oxycodone. Breathing stable and is now on room air.   UTI Complete course of rocephin on 06/11/14, continue foley care  Oral thrush Improved, on fluconazole, no documented esophagitis. Will d/c po fluconazole and continue nystatin  Hepatoma To follow with gi oncology. Monitor for significant ascites.   Protein calorie malnutrition Continue protein supplement, have SLP follow. cotninue megace and remeron. Continue protonix  C.diff colitis Completed treatment, no further loose stool  HTN bp under control without any medications, monitor bp for now and introduce medication if needed   Family/ staff Communication: reviewed care plan with patient and nursing supervisor  Goals of care: short term rehabilitation  Labs/tests ordered: cbc with diff, cmp    Blanchie Serve, MD  [facility] [phone] (Monday-Friday 8 am - 5 pm) [phone] (afterhours)

## 2014-06-10 ENCOUNTER — Other Ambulatory Visit: Payer: Self-pay | Admitting: Internal Medicine

## 2014-06-10 LAB — COMPREHENSIVE METABOLIC PANEL
ALT: 71 U/L — ABNORMAL HIGH (ref 0–35)
AST: 78 U/L — ABNORMAL HIGH (ref 0–37)
Albumin: 2.5 g/dL — ABNORMAL LOW (ref 3.5–5.2)
Alkaline Phosphatase: 246 U/L — ABNORMAL HIGH (ref 39–117)
BUN: 51 mg/dL — ABNORMAL HIGH (ref 6–23)
CO2: 20 mEq/L (ref 19–32)
Calcium: 9.4 mg/dL (ref 8.4–10.5)
Chloride: 109 mEq/L (ref 96–112)
Creat: 1.2 mg/dL — ABNORMAL HIGH (ref 0.50–1.10)
Glucose, Bld: 111 mg/dL — ABNORMAL HIGH (ref 70–99)
Potassium: 4.6 mEq/L (ref 3.5–5.3)
Sodium: 141 mEq/L (ref 135–145)
Total Bilirubin: 0.8 mg/dL (ref 0.2–1.2)
Total Protein: 6.4 g/dL (ref 6.0–8.3)

## 2014-06-10 LAB — CBC WITH DIFFERENTIAL/PLATELET
HCT: 42.6 % (ref 36.0–46.0)
Hemoglobin: 14.8 g/dL (ref 12.0–15.0)
Lymphocytes Relative: 17 % (ref 12–46)
Lymphs Abs: 1.8 10*3/uL (ref 0.7–4.0)
MCH: 35.3 pg — ABNORMAL HIGH (ref 26.0–34.0)
MCHC: 34.8 g/dL (ref 30.0–36.0)
MCV: 101.6 fL — ABNORMAL HIGH (ref 78.0–100.0)
Monocytes Absolute: 0.9 10*3/uL (ref 0.1–1.0)
Monocytes Relative: 8 % (ref 3–12)
Neutro Abs: 8.1 10*3/uL — ABNORMAL HIGH (ref 1.7–7.7)
Neutrophils Relative %: 75 % (ref 43–77)
Platelets: 90 10*3/uL — ABNORMAL LOW (ref 150–400)
RBC: 4.19 MIL/uL (ref 3.87–5.11)
RDW: 16.8 % — ABNORMAL HIGH (ref 11.5–15.5)
WBC: 10.8 10*3/uL — ABNORMAL HIGH (ref 4.0–10.5)

## 2014-06-13 ENCOUNTER — Other Ambulatory Visit: Payer: Self-pay | Admitting: *Deleted

## 2014-06-13 MED ORDER — OXYCODONE HCL 5 MG PO CAPS: ORAL_CAPSULE | ORAL | Status: DC

## 2014-06-13 NOTE — Telephone Encounter (Signed)
[facility] 

## 2014-06-14 ENCOUNTER — Inpatient Hospital Stay (HOSPITAL_COMMUNITY)
Admission: EM | Admit: 2014-06-14 | Discharge: 2014-06-22 | DRG: 371 | Disposition: A | Discharge status: Skilled Nursing Facility | Payer: Medicare Other | Attending: Internal Medicine | Admitting: Internal Medicine

## 2014-06-14 ENCOUNTER — Non-Acute Institutional Stay (SKILLED_NURSING_FACILITY): Payer: Medicare Other | Admitting: Internal Medicine

## 2014-06-14 ENCOUNTER — Emergency Department (HOSPITAL_COMMUNITY): Payer: Medicare Other

## 2014-06-14 ENCOUNTER — Encounter (HOSPITAL_COMMUNITY): Payer: Self-pay | Admitting: Emergency Medicine

## 2014-06-14 ENCOUNTER — Encounter: Payer: Self-pay | Admitting: Internal Medicine

## 2014-06-14 DIAGNOSIS — Z6828 Body mass index (BMI) 28.0-28.9, adult: Secondary | ICD-10-CM | POA: Diagnosis not present

## 2014-06-14 DIAGNOSIS — Z79899 Other long term (current) drug therapy: Secondary | ICD-10-CM | POA: Diagnosis not present

## 2014-06-14 DIAGNOSIS — F329 Major depressive disorder, single episode, unspecified: Secondary | ICD-10-CM | POA: Diagnosis present

## 2014-06-14 DIAGNOSIS — G9340 Encephalopathy, unspecified: Secondary | ICD-10-CM | POA: Diagnosis present

## 2014-06-14 DIAGNOSIS — J90 Pleural effusion, not elsewhere classified: Secondary | ICD-10-CM

## 2014-06-14 DIAGNOSIS — N179 Acute kidney failure, unspecified: Secondary | ICD-10-CM | POA: Diagnosis present

## 2014-06-14 DIAGNOSIS — E860 Dehydration: Secondary | ICD-10-CM | POA: Diagnosis present

## 2014-06-14 DIAGNOSIS — R109 Unspecified abdominal pain: Secondary | ICD-10-CM

## 2014-06-14 DIAGNOSIS — A0472 Enterocolitis due to Clostridium difficile, not specified as recurrent: Secondary | ICD-10-CM | POA: Diagnosis not present

## 2014-06-14 DIAGNOSIS — H269 Unspecified cataract: Secondary | ICD-10-CM | POA: Diagnosis present

## 2014-06-14 DIAGNOSIS — R531 Weakness: Secondary | ICD-10-CM

## 2014-06-14 DIAGNOSIS — C220 Liver cell carcinoma: Secondary | ICD-10-CM | POA: Diagnosis present

## 2014-06-14 DIAGNOSIS — C228 Malignant neoplasm of liver, primary, unspecified as to type: Secondary | ICD-10-CM

## 2014-06-14 DIAGNOSIS — F3289 Other specified depressive episodes: Secondary | ICD-10-CM | POA: Diagnosis present

## 2014-06-14 DIAGNOSIS — Z978 Presence of other specified devices: Secondary | ICD-10-CM

## 2014-06-14 DIAGNOSIS — R188 Other ascites: Secondary | ICD-10-CM

## 2014-06-14 DIAGNOSIS — D696 Thrombocytopenia, unspecified: Secondary | ICD-10-CM | POA: Diagnosis present

## 2014-06-14 DIAGNOSIS — I10 Essential (primary) hypertension: Secondary | ICD-10-CM | POA: Diagnosis present

## 2014-06-14 DIAGNOSIS — Z66 Do not resuscitate: Secondary | ICD-10-CM | POA: Diagnosis present

## 2014-06-14 DIAGNOSIS — E43 Unspecified severe protein-calorie malnutrition: Secondary | ICD-10-CM | POA: Diagnosis present

## 2014-06-14 DIAGNOSIS — F05 Delirium due to known physiological condition: Secondary | ICD-10-CM

## 2014-06-14 DIAGNOSIS — B370 Candidal stomatitis: Secondary | ICD-10-CM | POA: Diagnosis present

## 2014-06-14 DIAGNOSIS — R627 Adult failure to thrive: Secondary | ICD-10-CM | POA: Diagnosis present

## 2014-06-14 DIAGNOSIS — N289 Disorder of kidney and ureter, unspecified: Secondary | ICD-10-CM

## 2014-06-14 DIAGNOSIS — Z9889 Other specified postprocedural states: Secondary | ICD-10-CM

## 2014-06-14 DIAGNOSIS — E119 Type 2 diabetes mellitus without complications: Secondary | ICD-10-CM | POA: Diagnosis present

## 2014-06-14 DIAGNOSIS — R410 Disorientation, unspecified: Secondary | ICD-10-CM

## 2014-06-14 DIAGNOSIS — R5383 Other fatigue: Secondary | ICD-10-CM | POA: Diagnosis not present

## 2014-06-14 DIAGNOSIS — F32A Depression, unspecified: Secondary | ICD-10-CM

## 2014-06-14 DIAGNOSIS — E876 Hypokalemia: Secondary | ICD-10-CM | POA: Diagnosis present

## 2014-06-14 DIAGNOSIS — D72829 Elevated white blood cell count, unspecified: Secondary | ICD-10-CM | POA: Diagnosis present

## 2014-06-14 DIAGNOSIS — Z87891 Personal history of nicotine dependence: Secondary | ICD-10-CM | POA: Diagnosis not present

## 2014-06-14 DIAGNOSIS — K219 Gastro-esophageal reflux disease without esophagitis: Secondary | ICD-10-CM | POA: Diagnosis present

## 2014-06-14 DIAGNOSIS — K7460 Unspecified cirrhosis of liver: Secondary | ICD-10-CM | POA: Diagnosis present

## 2014-06-14 DIAGNOSIS — I252 Old myocardial infarction: Secondary | ICD-10-CM | POA: Diagnosis not present

## 2014-06-14 DIAGNOSIS — K766 Portal hypertension: Secondary | ICD-10-CM | POA: Diagnosis present

## 2014-06-14 DIAGNOSIS — R5381 Other malaise: Secondary | ICD-10-CM | POA: Diagnosis not present

## 2014-06-14 DIAGNOSIS — Z8673 Personal history of transient ischemic attack (TIA), and cerebral infarction without residual deficits: Secondary | ICD-10-CM | POA: Diagnosis not present

## 2014-06-14 DIAGNOSIS — K529 Noninfective gastroenteritis and colitis, unspecified: Secondary | ICD-10-CM

## 2014-06-14 DIAGNOSIS — Z960 Presence of urogenital implants: Secondary | ICD-10-CM

## 2014-06-14 LAB — CBC WITH DIFFERENTIAL/PLATELET
Basophils Absolute: 0 10*3/uL (ref 0.0–0.1)
Basophils Relative: 0 % (ref 0–1)
EOS PCT: 0 % (ref 0–5)
Eosinophils Absolute: 0 10*3/uL (ref 0.0–0.7)
HEMATOCRIT: 48.6 % — AB (ref 36.0–46.0)
HEMOGLOBIN: 16.8 g/dL — AB (ref 12.0–15.0)
LYMPHS ABS: 0.9 10*3/uL (ref 0.7–4.0)
LYMPHS PCT: 7 % — AB (ref 12–46)
MCH: 34.1 pg — ABNORMAL HIGH (ref 26.0–34.0)
MCHC: 34.6 g/dL (ref 30.0–36.0)
MCV: 98.6 fL (ref 78.0–100.0)
MONOS PCT: 3 % (ref 3–12)
Monocytes Absolute: 0.5 10*3/uL (ref 0.1–1.0)
Neutro Abs: 12.2 10*3/uL — ABNORMAL HIGH (ref 1.7–7.7)
Neutrophils Relative %: 90 % — ABNORMAL HIGH (ref 43–77)
PLATELETS: 126 10*3/uL — AB (ref 150–400)
RBC: 4.93 MIL/uL (ref 3.87–5.11)
RDW: 17.2 % — ABNORMAL HIGH (ref 11.5–15.5)
WBC: 13.5 10*3/uL — AB (ref 4.0–10.5)

## 2014-06-14 LAB — COMPREHENSIVE METABOLIC PANEL
ALT: 92 U/L — AB (ref 0–35)
AST: 103 U/L — AB (ref 0–37)
Albumin: 2.7 g/dL — ABNORMAL LOW (ref 3.5–5.2)
Alkaline Phosphatase: 5 U/L — ABNORMAL LOW (ref 39–117)
Anion gap: 21 — ABNORMAL HIGH (ref 5–15)
BUN: 67 mg/dL — ABNORMAL HIGH (ref 6–23)
CO2: 18 meq/L — AB (ref 19–32)
Calcium: 9.7 mg/dL (ref 8.4–10.5)
Chloride: 96 mEq/L (ref 96–112)
Creatinine, Ser: 1.42 mg/dL — ABNORMAL HIGH (ref 0.50–1.10)
GFR calc Af Amer: 38 mL/min — ABNORMAL LOW (ref >90)
GFR, EST NON AFRICAN AMERICAN: 33 mL/min — AB (ref >90)
Glucose, Bld: 132 mg/dL — ABNORMAL HIGH (ref 70–99)
POTASSIUM: 4.5 meq/L (ref 3.7–5.3)
SODIUM: 135 meq/L — AB (ref 137–147)
TOTAL PROTEIN: 7.4 g/dL (ref 6.0–8.3)
Total Bilirubin: <0.2 mg/dL — ABNORMAL LOW (ref 0.3–1.2)

## 2014-06-14 LAB — PROTIME-INR
INR: 1.26 (ref 0.00–1.49)
Prothrombin Time: 15.8 seconds — ABNORMAL HIGH (ref 11.6–15.2)

## 2014-06-14 LAB — URINE MICROSCOPIC-ADD ON

## 2014-06-14 LAB — URINALYSIS, ROUTINE W REFLEX MICROSCOPIC
GLUCOSE, UA: NEGATIVE mg/dL
Hgb urine dipstick: NEGATIVE
Ketones, ur: NEGATIVE mg/dL
Nitrite: NEGATIVE
PH: 5 (ref 5.0–8.0)
Protein, ur: NEGATIVE mg/dL
SPECIFIC GRAVITY, URINE: 1.026 (ref 1.005–1.030)
Urobilinogen, UA: 1 mg/dL (ref 0.0–1.0)

## 2014-06-14 LAB — CBG MONITORING, ED: Glucose-Capillary: 129 mg/dL — ABNORMAL HIGH (ref 70–99)

## 2014-06-14 LAB — AMMONIA: Ammonia: 55 umol/L (ref 11–60)

## 2014-06-14 MED ORDER — SODIUM CHLORIDE 0.9 % IV BOLUS (SEPSIS)
500.0000 mL | Freq: Once | INTRAVENOUS | Status: AC
  Administered 2014-06-14: 500 mL via INTRAVENOUS

## 2014-06-14 MED ORDER — IOHEXOL 300 MG/ML  SOLN
80.0000 mL | Freq: Once | INTRAMUSCULAR | Status: AC | PRN
  Administered 2014-06-14: 80 mL via INTRAVENOUS

## 2014-06-14 MED ORDER — METRONIDAZOLE IN NACL 5-0.79 MG/ML-% IV SOLN
500.0000 mg | Freq: Once | INTRAVENOUS | Status: AC
  Administered 2014-06-14: 500 mg via INTRAVENOUS
  Filled 2014-06-14: qty 100

## 2014-06-14 MED ORDER — MORPHINE SULFATE 4 MG/ML IJ SOLN
4.0000 mg | Freq: Once | INTRAMUSCULAR | Status: AC
  Administered 2014-06-14: 4 mg via INTRAVENOUS
  Filled 2014-06-14: qty 1

## 2014-06-14 MED ORDER — FENTANYL CITRATE 0.05 MG/ML IJ SOLN
50.0000 ug | Freq: Once | INTRAMUSCULAR | Status: AC
  Administered 2014-06-14: 50 ug via INTRAVENOUS
  Filled 2014-06-14: qty 2

## 2014-06-14 NOTE — ED Provider Notes (Signed)
CSN: [id number]     Arrival date & time 06/14/14  1630 History   First MD Initiated Contact with Patient 06/14/14 1646     Chief Complaint  Patient presents with  . Weakness     (Consider location/radiation/quality/duration/timing/severity/associated sxs/prior Treatment) Patient is a 80 y.o. female presenting with weakness. The history is provided by the patient.  Weakness This is a new problem. Pertinent negatives include no chest pain.   Patient was sent in for generalized weakness. Is in nursing home. Reportedly has had decreased oral intake. Has a Pleurx catheter on the right. Patient states he was last drink 2 weeks ago. She has a Foley catheter in place also. No fevers. She states decreased appetite. She has NASH. Sent in from nursing home after family requested. No headache. No chest pain. Past Medical History  Diagnosis Date  . Anemia, iron deficiency 08/07/2011  . Angiodysplasia of intestinal tract 08/07/2011  . Hypertension   . Arthritis   . Diverticulitis   . Phlebitis   . Macular degeneration   . TIA (transient ischemic attack)   . MI (myocardial infarction)   . Hiatal hernia   . AVM (arteriovenous malformation) of colon with hemorrhage   . Varicose veins of legs   . Cataract   . GERD (gastroesophageal reflux disease)   . DJD (degenerative joint disease)   . Diabetes mellitus     borderline  . History of blood transfusion   . PONV (postoperative nausea and vomiting)   . hepatocellar ca dx'd 08/2012  . Edema leg   . Fatty liver   . Vitamin D deficiency    Past Surgical History  Procedure Laterality Date  . 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 Abdominal hysterectomy    . Hemorrhoid surgery    . Esophagogastroduodenoscopy  08/30/2012    Procedure: ESOPHAGOGASTRODUODENOSCOPY (EGD);  Surgeon: Jeryl Columbia, MD;  Location: [facility];  Service: Endoscopy;  Laterality: N/A;  . Radiofrequency ablation liver tumor Right 01/2013    HFA ablation right  liver lesion  . Colonoscopy  06/2009    Dr. Watt Climes, due 5 years  . Endoscopic vein laser treatment Left 2014    Dr. Kellie Simmering   Family History  Problem Relation Age of Onset  . Colon cancer Sister   . Coronary artery disease Father   . Heart disease Father    History  Substance Use Topics  . Smoking status: Former Smoker -- 5.00 packs/day for 40 years    Types: Cigarettes    Start date: 04/14/1951    Quit date: 07/23/1990  . Smokeless tobacco: Never Used     Comment: quit smoking 23 years ago  . Alcohol Use: No   OB History   Grav Para Term Preterm Abortions TAB SAB Ect Mult Living                 Review of Systems  Constitutional: Positive for appetite change and fatigue. Negative for fever.  Cardiovascular: Negative for chest pain.  Genitourinary: Negative for flank pain.  Musculoskeletal: Negative for back pain.  Skin: Negative for wound.  Neurological: Positive for weakness.  Psychiatric/Behavioral: Negative for behavioral problems.      Allergies  Avelox; Statins; Sulfa antibiotics; Ace inhibitors; Doxycycline; Infed; Infergen; Vancomycin; Ciprofloxacin; and Fenofibrate  Home Medications   Prior to Admission medications   Medication Sig Start Date End Date Taking? Authorizing Provider  acetaminophen (TYLENOL) 325 MG tablet Take 650 mg by mouth every 8 (eight)  hours.   Yes Historical Provider, MD  cholecalciferol (VITAMIN D) 1000 UNITS tablet Take 1,000 Units by mouth daily.   Yes Historical Provider, MD  megestrol (MEGACE) 40 MG/ML suspension Take 10 mLs (400 mg total) by mouth 2 (two) times daily. 05/11/14  Yes Christina P [name], MD  mirtazapine (REMERON) 15 MG tablet Take 15 mg by mouth at bedtime.   Yes Historical Provider, MD  Multiple Vitamin (MULTIVITAMIN WITH MINERALS) TABS tablet Take 1 tablet by mouth daily. 05/11/14  Yes Christina P [name], MD  nystatin (MYCOSTATIN) 100000 UNIT/ML suspension Take 5 mLs (500,000 Units total) by mouth 4 (four) times daily.  05/11/14  Yes Venetia Maxon [name], MD  oxycodone (OXY-IR) 5 MG capsule Take one tablet by mouth every 6 hours as needed for pain 06/13/14  Yes Tiffany L Reed, DO  pantoprazole (PROTONIX) 40 MG tablet Take 40 mg by mouth 2 (two) times daily.   Yes Historical Provider, MD  saccharomyces boulardii (FLORASTOR) 250 MG capsule Take 1 capsule (250 mg total) by mouth 2 (two) times daily. 05/11/14  Yes Venetia Maxon [name], MD  Sodium Chloride Flush (NORMAL SALINE FLUSH) 0.9 % SOLN Inject 10 mLs into the vein every 8 (eight) hours. For uti, flush both ports   Yes Historical Provider, MD   BP 120/60  Pulse 108  Temp(Src) 100.3 F (37.9 C) (Rectal)  Resp 20  SpO2 94% Physical Exam  Constitutional: She appears well-developed.  HENT:  Head: Normocephalic.  Eyes: Pupils are equal, round, and reactive to light.  Cardiovascular:  Mild tachycardia  Pulmonary/Chest: Effort normal.  Pleurx catheter right chest wall. Mild decreased breath sounds on right.  Abdominal: There is no tenderness.  Musculoskeletal: She exhibits no tenderness.  Neurological: She is alert.   Patient is awake and appropriate, but somewhat slow to answer  Skin: Skin is warm.    ED Course  Procedures (including critical care time) Labs Review Labs Reviewed  URINALYSIS, ROUTINE W REFLEX MICROSCOPIC - Abnormal; Notable for the following:    Color, Urine AMBER (*)    APPearance CLOUDY (*)    Bilirubin Urine SMALL (*)    Leukocytes, UA SMALL (*)    All other components within normal limits  CBC WITH DIFFERENTIAL - Abnormal; Notable for the following:    WBC 13.5 (*)    Hemoglobin 16.8 (*)    HCT 48.6 (*)    MCH 34.1 (*)    RDW 17.2 (*)    Platelets 126 (*)    Neutrophils Relative % 90 (*)    Neutro Abs 12.2 (*)    Lymphocytes Relative 7 (*)    All other components within normal limits  PROTIME-INR - Abnormal; Notable for the following:    Prothrombin Time 15.8 (*)    All other components within normal limits  COMPREHENSIVE  METABOLIC PANEL - Abnormal; Notable for the following:    Sodium 135 (*)    CO2 18 (*)    Glucose, Bld 132 (*)    BUN 67 (*)    Creatinine, Ser 1.42 (*)    Albumin 2.7 (*)    AST 103 (*)    ALT 92 (*)    Alkaline Phosphatase 5 (*)    Total Bilirubin <0.2 (*)    GFR calc non Af Amer 33 (*)    GFR calc Af Amer 38 (*)    Anion gap 21 (*)    All other components within normal limits  URINE MICROSCOPIC-ADD ON - Abnormal; Notable for the  following:    Casts HYALINE CASTS (*)    All other components within normal limits  CBG MONITORING, ED - Abnormal; Notable for the following:    Glucose-Capillary 129 (*)    All other components within normal limits  AMMONIA    Imaging Review Dg Chest 2 View  06/14/2014   CLINICAL DATA:  Pleural effusion, altered mental status  EXAM: CHEST  2 VIEW  COMPARISON:  06/07/2014  FINDINGS: Right-sided chest tube directed towards the apex in unchanged position. No pneumothorax. Small right pleural effusion.  The left lung is clear.  No left pleural effusion or pneumothorax.  Stable cardiomediastinal silhouette. Thoracic aortic atherosclerosis. Unremarkable osseous structures.  IMPRESSION: 1. Right-sided chest tube in satisfactory position without a pneumothorax. Small right pleural effusion.   Electronically Signed   By: Kathreen Devoid   On: 06/14/2014 18:36   Ct Abdomen Pelvis W Contrast  06/14/2014   CLINICAL DATA:  Abdominal pain.  EXAM: CT ABDOMEN AND PELVIS WITH CONTRAST  TECHNIQUE: Multidetector CT imaging of the abdomen and pelvis was performed using the standard protocol following bolus administration of intravenous contrast.  CONTRAST:  57mL OMNIPAQUE IOHEXOL 300 MG/ML  SOLN  COMPARISON:  04/28/2014  FINDINGS: BODY WALL: Unremarkable.  LOWER CHEST: Large sliding-type hiatal hernia. Avid enhancement of the lower esophagus compatible with varices. There is a small right pleural effusion with pleural drain that appears well positioned. Mild dependent atelectasis.   ABDOMEN/PELVIS:  Liver: Cirrhotic liver morphology. Stable appearance of subcapsular mass in the inferior right liver measuring 25 mm in maximal diameter, a hepatoma ablation site. On delayed phase in segment 6 there is a 10 mm area of low density (image 8) not seen on the earlier phase. On the same image, more medially is a chronic 7 mm low-density nodule on delayed phase  Biliary: Distended gallbladder. No evidence of active inflammation or calcified stone.  Pancreas: Unremarkable.  Spleen: No splenomegaly.  Prominent splenule near the hilum.  Adrenals: Unremarkable.  Kidneys and ureters: Mild renal cortical thinning. No hydronephrosis or inflammatory change.  Bladder: Decompressed by Foley catheter.  Reproductive: Hysterectomy.  Ovaries unremarkable for age.  Bowel: IMA varices with hemorrhoids. There is diffuse thickening of of the distal colonic wall, best seen in the descending portion. No bowel obstruction. No pericecal inflammation.  Retroperitoneum: Retroperitoneal edema which is chronic and likely from portal hypertension. Chronic mild enlargement of lymph nodes within the upper abdominal ligaments, likely reactive to the patient's cirrhosis.  Peritoneum: Small volume ascites.  Vascular: No acute abnormality.  OSSEOUS: No acute abnormalities.  IMPRESSION: 1. Colonic wall thickening which could be from mild colitis or venous congestion. 2. Cirrhosis with portal hypertension and treated hepatoma. Newly seen 1 cm nodule in segment 6 on delayed phase. Recommend nonemergent liver MRI to evaluate for new hepatocellular carcinoma. 3. Chronic findings described above.   Electronically Signed   By: [name] M.D.   On: 06/14/2014 22:20     EKG Interpretation   Date/Time:  Tuesday June 14 2014 16:46:38 EDT Ventricular Rate:  118 PR Interval:  180 QRS Duration: 95 QT Interval:  339 QTC Calculation: 475 R Axis:   -57 Text Interpretation:  Sinus tachycardia Left anterior fascicular block   Anterior infarct, old Nonspecific T abnormalities, lateral leads SINCE  LAST TRACING HEART RATE HAS INCREASED Confirmed by Alvino Chapel  MD, Ovid Curd  [id number]) on 06/14/2014 5:08:34 PM      MDM   Final diagnoses:  Abdominal pain, unspecified abdominal location  Recurrent  right pleural effusion  Renal insufficiency   Patient with generalized weakness. Abdominal pain decreased oral intake. Recently treated for UTI. No UTI on urinalysis here. CT scan done due to abdominal pain that showed possible colitis. With her abdominal pain we'll start antibiotics admit to internal medicine    Jasper Riling. Alvino Chapel, MD 06/14/14 2358

## 2014-06-14 NOTE — ED Notes (Signed)
Bed: RESA Expected date:  Expected time:  Means of arrival:  Comments: EMS- elderly, weakness

## 2014-06-14 NOTE — Progress Notes (Signed)
Patient ID: [name], female   DOB: 08-07-1928, 80 y.o.   MRN: [id number]  Location: [facility] Provider:  Jonelle Sidle L. Mariea Clonts, D.O., C.M.D.  Code Status:  DNR  Chief Complaint  Patient presents with  . Acute Visit    pain with pleurex drainage, family wants tylenol stopped, still has foley, concerns about depression    HPI:  80 yo female with hepatoma, NASH with cirrhosis here for short term rehab s/p hospitalization for acute respiratory failure due to right pleural effusion (benign thoracentesis).  She has a pleurex catheter in place that is giving her pain when drained (not new).  She has oxycodone for this procedure and her daughter requested the xanax be given at that time, as well.  She has been more depressed.  She is on remeron for this and appetite.  She is on megace for appetite stimulation.  This should not be continued beyond 12 weeks due to lack of benefit beyond that timeframe. Tylenol was stopped per her family's request (does have nash with cirrhosis so seems reasonable to avoid).  She continues with a foley from the hospital and I/Os no longer being strictly monitored here so unnecessary.    Review of Systems:  Review of Systems  Constitutional: Positive for malaise/fatigue. Negative for fever.  HENT: Negative for congestion.   Respiratory: Negative for cough and shortness of breath.   Cardiovascular: Negative for chest pain.  Gastrointestinal: Positive for abdominal pain.  Genitourinary:       Foley in place with yellow urine  Neurological: Positive for weakness.  Psychiatric/Behavioral: Positive for depression.       Lethargic    Medications: Patient's Medications  New Prescriptions   No medications on file  Previous Medications   ALPRAZOLAM (XANAX) 1 MG TABLET    Take 0.5 tablets (0.5 mg total) by mouth at bedtime as needed for sleep or anxiety.   CHOLECALCIFEROL (VITAMIN D) 1000 UNITS TABLET    Take 1,000 Units by mouth daily.   MEGESTROL  (MEGACE) 40 MG/ML SUSPENSION    Take 10 mLs (400 mg total) by mouth 2 (two) times daily.   MIRTAZAPINE (REMERON) 15 MG TABLET    Take 15 mg by mouth at bedtime.   MULTIPLE VITAMIN (MULTIVITAMIN WITH MINERALS) TABS TABLET    Take 1 tablet by mouth daily.   NYSTATIN (MYCOSTATIN) 100000 UNIT/ML SUSPENSION    Take 5 mLs (500,000 Units total) by mouth 4 (four) times daily.   OXYCODONE (OXY-IR) 5 MG CAPSULE    Take one tablet by mouth every 6 hours as needed for pain   PANTOPRAZOLE (PROTONIX) 40 MG TABLET    Take 40 mg by mouth 2 (two) times daily.   SACCHAROMYCES BOULARDII (FLORASTOR) 250 MG CAPSULE    Take 1 capsule (250 mg total) by mouth 2 (two) times daily.  Modified Medications   No medications on file  Discontinued Medications   CEFTRIAXONE 1 G IN DEXTROSE 5 % 50 ML    Inject 1 g into the vein daily. Until 06/11/14   FLUCONAZOLE (DIFLUCAN) 200 MG TABLET    Take 200 mg by mouth daily.   IPRATROPIUM (ATROVENT) 0.02 % NEBULIZER SOLUTION    Take 2.5 mLs (0.5 mg total) by nebulization every 6 (six) hours as needed for wheezing or shortness of breath.   LEVALBUTEROL (XOPENEX) 1.25 MG/0.5ML NEBULIZER SOLUTION    Take 1.25 mg by nebulization every 6 (six) hours as needed for wheezing or shortness of breath.  Physical Exam: Filed Vitals:   06/14/14 1157  BP: 101/61  Pulse: 101  Temp: 97.2 F (36.2 C)  Resp: 18  Height: 5\' 5"  (1.651 m)  Weight: 165 lb (74.844 kg)  SpO2: 93%  Physical Exam  Neck: Neck supple. No JVD present.  Cardiovascular: Regular rhythm, normal heart sounds and intact distal pulses.   Mild tachy from 100s-114  Pulmonary/Chest: She has no wheezes. She has no rales.  Increased effort  Abdominal: Soft. Bowel sounds are normal. She exhibits no mass. There is no tenderness.  ascites  Musculoskeletal: Normal range of motion.  Neurological:  Lethargic, slow to respond to questions, but answers appropriately, soft spoken  Skin:  Has DTI to sacrum   Psychiatric:  Flat  affect    Labs reviewed: Basic Metabolic Panel:  Recent Labs  05/12/14 0630  05/20/14 0728  05/23/14 0535 05/26/14 0640  06/02/14 0635 06/06/14 0710 06/10/14 0640  NA 136*  < > 137  < > 137 137  < > 138 141 141  K 4.4  < > 4.8  < > 4.7 3.9  < > 4.7 4.8 4.6  CL 105  < > 106  < > 106 105  < > 105 106 109  CO2 19  < > 22  < > 21 20  < > 21 23 20   GLUCOSE 87  < > 95  < > 111* 98  < > 102* 114* 111*  BUN 42*  < > 38*  < > 42* 36*  < > 44* 48* 51*  CREATININE 1.14*  < > 1.15*  < > 1.14* 1.10  < > 1.07 1.20* 1.20*  CALCIUM 9.3  < > 9.0  < > 9.1 8.5  < > 8.9 8.9 9.4  MG 2.0  --   --   --  2.1 2.2  --   --   --   --   PHOS 2.9  --  2.9  --  3.1 2.9  --   --   --   --   < > = values in this interval not displayed.  Liver Function Tests:  Recent Labs  05/30/14 0340 06/06/14 0710 06/10/14 0640  AST 67* 78* 78*  ALT 54* 72* 71*  ALKPHOS 213* 256* 246*  BILITOT 0.9 1.3* 0.8  PROT 6.3 6.5 6.4  ALBUMIN 2.3* 2.4* 2.5*    CBC:  Recent Labs  06/02/14 0635 06/06/14 0710 06/10/14 0640  WBC 9.3 10.5 10.8*  NEUTROABS 6.8 7.2 8.1*  HGB 14.7 15.2* 14.8  HCT 43.7 44.7 42.6  MCV 99.8 99.6 101.6*  PLT 107* 94* 90*  06/10/14:  Wbc 10.8, h/h 14.8/42.6, plts 90, Na 141, K 4.6, cr 1.2, tb 0.8, AP 246, AST 78, ALT 71, tp 6.4, alb 2.5, Ca 9.4  Assessment/Plan 1. Recurrent right pleural effusion -with pleurx in place, painful to drain for patient -says her breathing is ok at this time  2. Hepatoma -has NASH with cirrhosis so may have hepatic encephalopathy as cause of delirium at this time--wanted to do stat ammonia level  3. Foley catheter in place - planned to d/c but going to hospital now  4. Protein-calorie malnutrition, severe -on supplements as well as megace and remeron -would d/c megace if intake no better anyway -said she did not eat lunch well today--does not feel well -may need hydration, but need labs to further assess and could be dangerous with her effusion  5.  Depression -had hoped to start SSRI in  addition to remeron for her  6. Acute hypoactive delirium due to another medical condition -suspect cirrhosis related, but could also be an acute infection--needs labs  Initial plan based on concerns:   d/c foley and monitor output over next few days to make sure she is not retaining D/c tylenol orders Begin zoloft 50mg  for a week, then 100mg  thereafter for depression Cont oxycodone and xanax before pleurex catheter drainage  Plan after pt seen and found to be hypoactively delirious with mild tachycardia:  I wanted to do stat labs with ammonia, cbc, cmp and VS q shift while awake, but her family requested she be sent out immediately for evaluation   Family/ staff Communication: seen with unit supervisor  Goals of care: supportive, prognosis is poor  Labs/tests ordered:  No additional at this time

## 2014-06-14 NOTE — ED Notes (Signed)
Clamped off foley cath in order to obtain urine specimen.

## 2014-06-14 NOTE — ED Notes (Addendum)
Per EMS, Pt, from [facility], c/o increased weakness and loss of appetite x 1 week.  Family reported that Pt was diagnosed w/ UTI x 1 week ago.  Pt has a chest tube and facility reported that it has been draining more than normal.  Chest tube was placed 8/17.  EMS reports Pt was seen by DO at facility and sent here for lab work.  Upon assessment, Pt reports "I just feel awful."  Denies pain.  When asked if she feels any better since beginning antibiotics, Pt responded "not really."

## 2014-06-15 ENCOUNTER — Encounter (HOSPITAL_COMMUNITY): Payer: Self-pay | Admitting: Emergency Medicine

## 2014-06-15 DIAGNOSIS — K5289 Other specified noninfective gastroenteritis and colitis: Secondary | ICD-10-CM

## 2014-06-15 DIAGNOSIS — K7460 Unspecified cirrhosis of liver: Secondary | ICD-10-CM

## 2014-06-15 DIAGNOSIS — J90 Pleural effusion, not elsewhere classified: Secondary | ICD-10-CM

## 2014-06-15 DIAGNOSIS — R109 Unspecified abdominal pain: Secondary | ICD-10-CM

## 2014-06-15 DIAGNOSIS — I10 Essential (primary) hypertension: Secondary | ICD-10-CM

## 2014-06-15 DIAGNOSIS — E43 Unspecified severe protein-calorie malnutrition: Secondary | ICD-10-CM

## 2014-06-15 LAB — COMPREHENSIVE METABOLIC PANEL
ALBUMIN: 2.3 g/dL — AB (ref 3.5–5.2)
ALK PHOS: 269 U/L — AB (ref 39–117)
ALT: 77 U/L — ABNORMAL HIGH (ref 0–35)
ALT: 72 U/L — ABNORMAL HIGH (ref 0–35)
ANION GAP: 16 — AB (ref 5–15)
AST: 74 U/L — ABNORMAL HIGH (ref 0–37)
AST: 78 U/L — AB (ref 0–37)
Albumin: 2.2 g/dL — ABNORMAL LOW (ref 3.5–5.2)
Alkaline Phosphatase: 333 U/L — ABNORMAL HIGH (ref 39–117)
Anion gap: 17 — ABNORMAL HIGH (ref 5–15)
BUN: 66 mg/dL — ABNORMAL HIGH (ref 6–23)
BUN: 68 mg/dL — AB (ref 6–23)
CHLORIDE: 103 meq/L (ref 96–112)
CO2: 16 mEq/L — ABNORMAL LOW (ref 19–32)
CO2: 18 meq/L — AB (ref 19–32)
CREATININE: 1.26 mg/dL — AB (ref 0.50–1.10)
CREATININE: 1.3 mg/dL — AB (ref 0.50–1.10)
Calcium: 9.1 mg/dL (ref 8.4–10.5)
Calcium: 9 mg/dL (ref 8.4–10.5)
Chloride: 103 mEq/L (ref 96–112)
GFR calc Af Amer: 44 mL/min — ABNORMAL LOW (ref >90)
GFR, EST AFRICAN AMERICAN: 43 mL/min — AB (ref >90)
GFR, EST NON AFRICAN AMERICAN: 37 mL/min — AB (ref >90)
GFR, EST NON AFRICAN AMERICAN: 38 mL/min — AB (ref >90)
GLUCOSE: 133 mg/dL — AB (ref 70–99)
Glucose, Bld: 135 mg/dL — ABNORMAL HIGH (ref 70–99)
POTASSIUM: 4.1 meq/L (ref 3.7–5.3)
Potassium: 4.2 mEq/L (ref 3.7–5.3)
Sodium: 136 mEq/L — ABNORMAL LOW (ref 137–147)
Sodium: 137 mEq/L (ref 137–147)
Total Bilirubin: 1.8 mg/dL — ABNORMAL HIGH (ref 0.3–1.2)
Total Bilirubin: 1.6 mg/dL — ABNORMAL HIGH (ref 0.3–1.2)
Total Protein: 6.4 g/dL (ref 6.0–8.3)
Total Protein: 6.1 g/dL (ref 6.0–8.3)

## 2014-06-15 LAB — CBC
HCT: 44.7 % (ref 36.0–46.0)
Hemoglobin: 14.9 g/dL (ref 12.0–15.0)
MCH: 33.3 pg (ref 26.0–34.0)
MCHC: 33.3 g/dL (ref 30.0–36.0)
MCV: 100 fL (ref 78.0–100.0)
PLATELETS: ADEQUATE 10*3/uL (ref 150–400)
RBC: 4.47 MIL/uL (ref 3.87–5.11)
RDW: 17.3 % — AB (ref 11.5–15.5)
WBC: 11 10*3/uL — AB (ref 4.0–10.5)

## 2014-06-15 LAB — PROTIME-INR
INR: 1.42 (ref 0.00–1.49)
Prothrombin Time: 17.4 seconds — ABNORMAL HIGH (ref 11.6–15.2)

## 2014-06-15 LAB — TSH: TSH: 2.18 u[IU]/mL (ref 0.350–4.500)

## 2014-06-15 MED ORDER — MIRTAZAPINE 15 MG PO TABS
15.0000 mg | ORAL_TABLET | Freq: Every day | ORAL | Status: DC
  Filled 2014-06-15 (×2): qty 1

## 2014-06-15 MED ORDER — OXYCODONE HCL 5 MG PO TABS
2.5000 mg | ORAL_TABLET | Freq: Four times a day (QID) | ORAL | Status: DC | PRN
  Administered 2014-06-19: 2.5 mg via ORAL
  Filled 2014-06-15 (×2): qty 1

## 2014-06-15 MED ORDER — MIRTAZAPINE 7.5 MG PO TABS
7.5000 mg | ORAL_TABLET | Freq: Every day | ORAL | Status: DC
  Administered 2014-06-15 – 2014-06-19 (×5): 7.5 mg via ORAL
  Filled 2014-06-15 (×6): qty 1

## 2014-06-15 MED ORDER — SODIUM CHLORIDE 0.9 % IV SOLN
INTRAVENOUS | Status: DC
  Administered 2014-06-15: 02:00:00 via INTRAVENOUS

## 2014-06-15 MED ORDER — PANTOPRAZOLE SODIUM 40 MG PO TBEC
40.0000 mg | DELAYED_RELEASE_TABLET | Freq: Two times a day (BID) | ORAL | Status: DC
Start: 2014-06-15 — End: 2014-06-16
  Administered 2014-06-15 – 2014-06-16 (×2): 40 mg via ORAL
  Filled 2014-06-15 (×4): qty 1

## 2014-06-15 MED ORDER — MORPHINE SULFATE 2 MG/ML IJ SOLN
2.0000 mg | INTRAMUSCULAR | Status: DC | PRN
  Administered 2014-06-15 (×2): 2 mg via INTRAVENOUS
  Filled 2014-06-15 (×2): qty 1

## 2014-06-15 MED ORDER — ONDANSETRON HCL 4 MG PO TABS: 4.0000 mg | ORAL_TABLET | Freq: Four times a day (QID) | ORAL | Status: DC | PRN

## 2014-06-15 MED ORDER — MORPHINE SULFATE 2 MG/ML IJ SOLN
1.0000 mg | Freq: Four times a day (QID) | INTRAMUSCULAR | Status: DC | PRN
  Administered 2014-06-17 – 2014-06-20 (×3): 1 mg via INTRAVENOUS
  Filled 2014-06-15 (×4): qty 1

## 2014-06-15 MED ORDER — SODIUM CHLORIDE 0.9 % IV SOLN
3.0000 g | Freq: Three times a day (TID) | INTRAVENOUS | Status: DC
  Administered 2014-06-15 – 2014-06-16 (×4): 3 g via INTRAVENOUS
  Filled 2014-06-15 (×4): qty 3

## 2014-06-15 MED ORDER — ONDANSETRON HCL 4 MG/2ML IJ SOLN
4.0000 mg | Freq: Four times a day (QID) | INTRAMUSCULAR | Status: DC | PRN
  Administered 2014-06-15: 4 mg via INTRAVENOUS
  Filled 2014-06-15: qty 2

## 2014-06-15 MED ORDER — NYSTATIN 100000 UNIT/ML MT SUSP
5.0000 mL | Freq: Four times a day (QID) | OROMUCOSAL | Status: DC
  Administered 2014-06-15 – 2014-06-22 (×23): 500000 [IU] via ORAL
  Filled 2014-06-15 (×32): qty 5

## 2014-06-15 MED ORDER — SODIUM CHLORIDE 0.9 % IV SOLN
INTRAVENOUS | Status: AC
  Administered 2014-06-16: 03:00:00 via INTRAVENOUS

## 2014-06-15 MED ORDER — OXYCODONE HCL 5 MG PO TABS: 5.0000 mg | ORAL_TABLET | Freq: Four times a day (QID) | ORAL | Status: DC | PRN

## 2014-06-15 MED ORDER — SACCHAROMYCES BOULARDII 250 MG PO CAPS
250.0000 mg | ORAL_CAPSULE | Freq: Two times a day (BID) | ORAL | Status: DC
Start: 2014-06-15 — End: 2014-06-22
  Administered 2014-06-15 – 2014-06-22 (×14): 250 mg via ORAL
  Filled 2014-06-15 (×16): qty 1

## 2014-06-15 NOTE — Progress Notes (Signed)
ANTIBIOTIC CONSULT NOTE - INITIAL  Pharmacy Consult for unasyn Indication: Intra-abdominal Infection  Allergies  Allergen Reactions  . Avelox [Moxifloxacin Hcl In Nacl] Anaphylaxis  . Statins Other (See Comments)    Liver function elevations  . Sulfa Antibiotics Hives and Itching  . Ace Inhibitors     Unknown  . Doxycycline     Unknown  . Infed [Iron Dextran] Other (See Comments)    IV IRON, Unknown  . Infergen [Interferon Alfacon-1]   . Vancomycin Other (See Comments)    Flushing, erythema to neck and face, tolerated vanc at lower infusion rate  . Ciprofloxacin Other (See Comments)    Complains of sore mouth  . Fenofibrate Other (See Comments)    constipation    Patient Measurements: Height: 5\' 5"  (165.1 cm) Weight: 164 lb 11.2 oz (74.707 kg) IBW/kg (Calculated) : 57 Adjusted Body Weight:   Vital Signs: Temp: 98.9 F (37.2 C) (09/23 0432) Temp src: Oral (09/23 0432) BP: 118/59 mmHg (09/23 0432) Pulse Rate: 104 (09/23 0432) Intake/Output from previous day:   Intake/Output from this shift:    Labs:  Recent Labs  06/14/14 1725 06/15/14 0045 06/15/14 0419  WBC 13.5*  --  11.0*  HGB 16.8*  --  14.9  PLT 126*  --  PLATELET CLUMPS NOTED ON SMEAR, COUNT APPEARS ADEQUATE  CREATININE 1.42* 1.26* 1.30*   Estimated Creatinine Clearance: 33.2 ml/min (by C-G formula based on Cr of 1.3). No results found for this basename: Letta Median, VANCORANDOM, GENTTROUGH, GENTPEAK, GENTRANDOM, TOBRATROUGH, TOBRAPEAK, TOBRARND, AMIKACINPEAK, AMIKACINTROU, AMIKACIN,  in the last 72 hours   Microbiology: Recent Results (from the past 720 hour(s))  URINE CULTURE     Status: None   Collection Time    05/20/14  2:30 PM      Result Value Ref Range Status   Specimen Description URINE, RANDOM   Final   Special Requests NONE   Final   Culture  Setup Time     Final   Value: 05/20/2014 23:19     Performed at [place]     Final   Value: 75,000  COLONIES/ML     Performed at [facility]   Culture     Final   Value: LACTOBACILLUS SPECIES     Note: Standardized susceptibility testing for this organism is not available.     Performed at [facility]   Report Status 05/21/2014 FINAL   Final  CLOSTRIDIUM DIFFICILE BY PCR     Status: None   Collection Time    06/01/14  8:25 AM      Result Value Ref Range Status   C difficile by pcr NEGATIVE  NEGATIVE Final  URINE CULTURE     Status: None   Collection Time    06/06/14  3:07 PM      Result Value Ref Range Status   Specimen Description URINE, RANDOM   Final   Special Requests NONE   Final   Culture  Setup Time     Final   Value: 06/06/2014 16:21     Performed at [place]     Final   Value: >=100,000 COLONIES/ML     Performed at [facility]   Culture     Final   Value: KLEBSIELLA PNEUMONIAE     Note: Two isolates with different morphologies were identified as the same organism.The most resistant organism was reported.     Performed at  [facility]   Report Status 06/09/2014 FINAL   Final   Organism ID, Bacteria KLEBSIELLA PNEUMONIAE   Final    Medical History: Past Medical History  Diagnosis Date  . Anemia, iron deficiency 08/07/2011  . Angiodysplasia of intestinal tract 08/07/2011  . Hypertension   . Arthritis   . Diverticulitis   . Phlebitis   . Macular degeneration   . TIA (transient ischemic attack)   . MI (myocardial infarction)   . Hiatal hernia   . AVM (arteriovenous malformation) of colon with hemorrhage   . Varicose veins of legs   . Cataract   . GERD (gastroesophageal reflux disease)   . DJD (degenerative joint disease)   . Diabetes mellitus     borderline  . History of blood transfusion   . PONV (postoperative nausea and vomiting)   . Edema leg   . Fatty liver   . Vitamin D deficiency   . hepatocellar ca dx'd 08/2012    Medications:  Anti-infectives   Start     Dose/Rate Route Frequency  Ordered Stop   06/15/14 0645  Ampicillin-Sulbactam (UNASYN) 3 g in sodium chloride 0.9 % 100 mL IVPB     3 g 100 mL/hr over 60 Minutes Intravenous 3 times per day 06/15/14 0630     06/14/14 2330  metroNIDAZOLE (FLAGYL) IVPB 500 mg     500 mg 100 mL/hr over 60 Minutes Intravenous  Once 06/14/14 2321 06/15/14 0027     Assessment: Patient with Intra-abdominal Infection.  Renal function is reduced.  Goal of Therapy:  Unasyn dosed based on patient weight and renal function   Plan:  Follow up culture results Unasyn 3gm iv q8hr  Nani Skillern Crowford 06/15/2014,6:32 AM

## 2014-06-15 NOTE — Progress Notes (Signed)
PROGRESS NOTE    [name] XNA:[id number] DOB: September 11, 1928 DOA: 06/14/2014 PCP: Alesia Richards, MD  HPI/Brief narrative 80 year old female with history of anemia, HTN, DM 2, recurrent diverticulitis, recent C. difficile colitis, cirrhosis with portal hypertension, right Pleurx catheter placed for pleural effusion related to hepatic disease, presented with abdominal pain, poor appetite and generalized weakness. Patient noted to have loose stools in the hospital. CT abdomen shows colonic wall thickening.   Assessment/Plan:  1. Abdominal pain: Unclear etiology. CT abdomen shows colonic wall thickening. Given prior history of C. difficile, ongoing loose stools and mild leukocytosis-rule out C. difficile. Check C. difficile and GI pathogen panel PCR. For now, continue IV Unasyn. UA-negative for UTI features. 2. Generalized weakness/failure to thrive: Secondary to multiple medical complications complicating advanced age. 3. Dehydration: IV fluids 4. Acute kidney injury: Likely prerenal. IV fluids and follow BMP. 5. Right pleural effusion secondary to hepatic disease: Status post Pleurx-drained nightly. Small pleural effusion on chest x-ray. 6. Hypertension: Controlled off medications. 7. DM 2: Check CBGs. 8. Cirrhosis with portal hypertension: Small volume ascites on CT. 9. History of C. difficile colitis: Management as above. Continue probiotics. 10. Altered mental status: Ammonia normal.? Baseline.? Secondary to acute illness. Treat above medical conditions and follow clinically. No focal deficits.   Code Status: DO NOT RESUSCITATE Family Communication: Discussed with daughter Ms. Laurita Quint Disposition Plan: To be determined   Consultants:  None  Procedures:  None  Antibiotics:  IV Unasyn   Subjective: Appears slightly confused-not much history available from patient. As per nursing, 2-3 loose BMs this morning.  Objective: Filed Vitals:   06/14/14 2330 06/15/14  0143 06/15/14 0432 06/15/14 1500  BP: 120/60 122/67 118/59 120/64  Pulse: 108 111 104 103  Temp:  99.4 F (37.4 C) 98.9 F (37.2 C) 98.7 F (37.1 C)  TempSrc:  Oral Oral Oral  Resp: 20 18 20 15   Height:  5\' 5"  (1.651 m)    Weight:  74.707 kg (164 lb 11.2 oz)    SpO2: 94% 96% 93% 95%    Intake/Output Summary (Last 24 hours) at 06/15/14 1740 Last data filed at 06/15/14 1330  Gross per 24 hour  Intake      0 ml  Output    650 ml  Net   -650 ml   Filed Weights   06/15/14 0143  Weight: 74.707 kg (164 lb 11.2 oz)     Exam:  General exam: Elderly frail chronically ill-looking female lying comfortably in bed Respiratory system: Poor inspiratory effort, diminished breath sounds in the bases but otherwise clear to auscultation. No increased work of breathing. Right Pleurx intact. Cardiovascular system: S1 & S2 heard, RRR. No JVD, murmurs, gallops, clicks or pedal edema. Gastrointestinal system: Abdomen is mildly distended, soft and nontender. Normal bowel sounds heard. Central nervous system: Alert and oriented. No focal neurological deficits. Extremities: Symmetric 5 x 5 power.   Data Reviewed: Basic Metabolic Panel:  Recent Labs Lab 06/10/14 0640 06/14/14 1725 06/15/14 0045 06/15/14 0419  NA 141 135* 136* 137  K 4.6 4.5 4.2 4.1  CL 109 96 103 103  CO2 20 18* 16* 18*  GLUCOSE 111* 132* 135* 133*  BUN 51* 67* 66* 68*  CREATININE 1.20* 1.42* 1.26* 1.30*  CALCIUM 9.4 9.7 9.1 9.0   Liver Function Tests:  Recent Labs Lab 06/10/14 0640 06/14/14 1725 06/15/14 0045 06/15/14 0419  AST 78* 103* 78* 74*  ALT 71* 92* 77* 72*  ALKPHOS 246* 5* 333* 269*  BILITOT 0.8 <0.2* 1.8* 1.6*  PROT 6.4 7.4 6.4 6.1  ALBUMIN 2.5* 2.7* 2.3* 2.2*   No results found for this basename: LIPASE, AMYLASE,  in the last 168 hours  Recent Labs Lab 06/14/14 1727  AMMONIA 55   CBC:  Recent Labs Lab 06/10/14 0640 06/14/14 1725 06/15/14 0419  WBC 10.8* 13.5* 11.0*  NEUTROABS 8.1*  12.2*  --   HGB 14.8 16.8* 14.9  HCT 42.6 48.6* 44.7  MCV 101.6* 98.6 100.0  PLT 90* 126* PLATELET CLUMPS NOTED ON SMEAR, COUNT APPEARS ADEQUATE   Cardiac Enzymes: No results found for this basename: CKTOTAL, CKMB, CKMBINDEX, TROPONINI,  in the last 168 hours BNP (last 3 results)  Recent Labs  05/08/14 0525  PROBNP 71.3   CBG:  Recent Labs Lab 06/14/14 1650  GLUCAP 129*    Recent Results (from the past 240 hour(s))  URINE CULTURE     Status: None   Collection Time    06/06/14  3:07 PM      Result Value Ref Range Status   Specimen Description URINE, RANDOM   Final   Special Requests NONE   Final   Culture  Setup Time     Final   Value: 06/06/2014 16:21     Performed at [facility]     Final   Value: >=100,000 COLONIES/ML     Performed at [facility]   Culture     Final   Value: KLEBSIELLA PNEUMONIAE     Note: Two isolates with different morphologies were identified as the same organism.The most resistant organism was reported.     Performed at [facility]   Report Status 06/09/2014 FINAL   Final   Organism ID, Bacteria KLEBSIELLA PNEUMONIAE   Final           Studies: Dg Chest 2 View  06/14/2014   CLINICAL DATA:  Pleural effusion, altered mental status  EXAM: CHEST  2 VIEW  COMPARISON:  06/07/2014  FINDINGS: Right-sided chest tube directed towards the apex in unchanged position. No pneumothorax. Small right pleural effusion.  The left lung is clear.  No left pleural effusion or pneumothorax.  Stable cardiomediastinal silhouette. Thoracic aortic atherosclerosis. Unremarkable osseous structures.  IMPRESSION: 1. Right-sided chest tube in satisfactory position without a pneumothorax. Small right pleural effusion.   Electronically Signed   By: Kathreen Devoid   On: 06/14/2014 18:36   Ct Abdomen Pelvis W Contrast  06/14/2014   CLINICAL DATA:  Abdominal pain.  EXAM: CT ABDOMEN AND PELVIS WITH CONTRAST  TECHNIQUE: Multidetector CT  imaging of the abdomen and pelvis was performed using the standard protocol following bolus administration of intravenous contrast.  CONTRAST:  9mL OMNIPAQUE IOHEXOL 300 MG/ML  SOLN  COMPARISON:  04/28/2014  FINDINGS: BODY WALL: Unremarkable.  LOWER CHEST: Large sliding-type hiatal hernia. Avid enhancement of the lower esophagus compatible with varices. There is a small right pleural effusion with pleural drain that appears well positioned. Mild dependent atelectasis.  ABDOMEN/PELVIS:  Liver: Cirrhotic liver morphology. Stable appearance of subcapsular mass in the inferior right liver measuring 25 mm in maximal diameter, a hepatoma ablation site. On delayed phase in segment 6 there is a 10 mm area of low density (image 8) not seen on the earlier phase. On the same image, more medially is a chronic 7 mm low-density nodule on delayed phase  Biliary: Distended gallbladder. No evidence of active inflammation or calcified stone.  Pancreas: Unremarkable.  Spleen: No splenomegaly.  Prominent splenule near the hilum.  Adrenals: Unremarkable.  Kidneys and ureters: Mild renal cortical thinning. No hydronephrosis or inflammatory change.  Bladder: Decompressed by Foley catheter.  Reproductive: Hysterectomy.  Ovaries unremarkable for age.  Bowel: IMA varices with hemorrhoids. There is diffuse thickening of of the distal colonic wall, best seen in the descending portion. No bowel obstruction. No pericecal inflammation.  Retroperitoneum: Retroperitoneal edema which is chronic and likely from portal hypertension. Chronic mild enlargement of lymph nodes within the upper abdominal ligaments, likely reactive to the patient's cirrhosis.  Peritoneum: Small volume ascites.  Vascular: No acute abnormality.  OSSEOUS: No acute abnormalities.  IMPRESSION: 1. Colonic wall thickening which could be from mild colitis or venous congestion. 2. Cirrhosis with portal hypertension and treated hepatoma. Newly seen 1 cm nodule in segment 6 on delayed  phase. Recommend nonemergent liver MRI to evaluate for new hepatocellular carcinoma. 3. Chronic findings described above.   Electronically Signed   By: [name] M.D.   On: 06/14/2014 22:20        Scheduled Meds: . ampicillin-sulbactam (UNASYN) IV  3 g Intravenous 3 times per day  . mirtazapine  7.5 mg Oral QHS  . nystatin  5 mL Oral QID  . pantoprazole  40 mg Oral BID AC  . saccharomyces boulardii  250 mg Oral BID   Continuous Infusions: . sodium chloride 100 mL/hr at 06/15/14 1730    Principal Problem:   Colitis Active Problems:   Hypertension   Cirrhosis   Portal hypertension   Hepatoma   Dehydration   Recurrent right pleural effusion   Oral thrush   Protein-calorie malnutrition, severe   Abdominal pain    Time spent: 45 minutes.    Vernell Leep, MD, FACP, FHM. [facility] Pager [phone]  If 7PM-7AM, please contact night-coverage [email or web address] Password [email or web address] 06/15/2014, 5:40 PM    LOS: 1 day

## 2014-06-15 NOTE — Progress Notes (Signed)
Clinical Social Work Department BRIEF PSYCHOSOCIAL ASSESSMENT 06/15/2014  Patient:  [name], MCSWAIN     Account Number:  [id number]     Admit date:  06/14/2014  Clinical Social Worker:  Ulyess Blossom  Date/Time:  06/15/2014 04:00 PM  Referred by:  Physician  Date Referred:  06/15/2014 Referred for  SNF Placement   Other Referral:   Interview type:  Family Other interview type:    PSYCHOSOCIAL DATA Living Status:  FACILITY Admitted from facility:  [place], [place] Level of care:  [place] Primary support name:  Suanne Marker Joyce/[phone] Primary support relationship to patient:  CHILD, ADULT Degree of support available:   strong    CURRENT CONCERNS Current Concerns  Post-Acute Placement   Other Concerns:    SOCIAL WORK ASSESSMENT / PLAN CSW received referral that pt admitted from [facility].    CSW met with pt son and pt daughter alone in conference room at pt family request. Pt oriented to person only and unable to adequately participate in assessment. CSW introduced self and explained role, but pt daughter familiar with CSW from pt previous admission. CSW provided emotional support as pt daughter discussed that pt was at [facility] for 28 days and the transitioned to [facility]. Pt daughter shared that pt daughter was hopeful that pt would have been able to go to [facility], but [facility] was unable to manage pt pleurex catherter and the amount that it needed drained. Pt daughter expressed that she has been satisfied at [facility] and would be agreeable to pt returning if pt continues to need daily drainage from pleurex drain. Emotional support provided as pt son and pt daughter expressed that that they saw some improvement in pt when pt transitioned to [facility], but feel that today the pt looks the worst that they have seen her in a while and  expressed great concern about her. Pt son and pt daughter share that they continue to take things day by day and are hopeful to meet with the doctor tomorrow to get a better picture of pt acute needs.    CSW updated [facility] and will continue to update facility on pt progress.    CSW to continue to follow to provide support and assist with pt discharge planning needs.   Assessment/plan status:  Psychosocial Support/Ongoing Assessment of Needs Other assessment/ plan:   discharge planning   Information/referral to community resources:   Referral back to [facility].    PATIENT'S/FAMILY'S RESPONSE TO PLAN OF CARE: Pt oriented to person only and unable to engage in assessment. Pt children supportive and actively involved in pt care. Pt children are strong advocates for pt and want what is best for the pt. Pt son and pt daughter appreciative of CSW support and assistance.    Alison Murray, MSW, [place] Work [phone]

## 2014-06-15 NOTE — H&P (Addendum)
[facility] History and Physical  Patient: [name]  ZJQ:[id number]  DOB: October 14, 1927  DOS: the patient was seen and examined on 06/15/2014 PCP: Alesia Richards, MD  Chief Complaint: Abdominal pain and generalized weakness  HPI: [name] is a 80 y.o. female with Past medical history of anemia, hypertension, angiodysplasia of the GI tract, GERD, diabetes, TIA, diverticulosis with recurrent diverticulitis and recent C. difficile colitis. The patient is presenting with complaints of abdominal pain and generalized weakness. History was obtained from patient's daughter who mentions that patient was improving after her recent admission and discharge from the hospital to the nursing home. Since last 2 days patient has been having progressively decreasing appetite with decreasing activity and generalized weakness and as per the daughter she has not been able to participate adequately in her physical therapy. Since this morning the patient has stopped eating and has been lying in the bed throughout the day. No episodes of vomiting no episodes of diarrhea. Patient has been found to have formed stool. No active bleeding or black color bowel movement reported. No fever no chills no cough no chest pain reported. No other recent change in patient's medication reported. Daughter reports that the patient has been mentioning about feeling depressed and has reportedly stated that "she is going to die"  The patient is coming from skilled nursing facility. And at her baseline dependent for most of her ADL.  Review of Systems: as mentioned in the history of present illness.  A Comprehensive review of the other systems is negative.  Past Medical History  Diagnosis Date  . Anemia, iron deficiency 08/07/2011  . Angiodysplasia of intestinal tract 08/07/2011  . Hypertension   . Arthritis   . Diverticulitis   . Phlebitis   . Macular degeneration   . TIA (transient ischemic attack)   . MI  (myocardial infarction)   . Hiatal hernia   . AVM (arteriovenous malformation) of colon with hemorrhage   . Varicose veins of legs   . Cataract   . GERD (gastroesophageal reflux disease)   . DJD (degenerative joint disease)   . Diabetes mellitus     borderline  . History of blood transfusion   . PONV (postoperative nausea and vomiting)   . hepatocellar ca dx'd 08/2012  . Edema leg   . Fatty liver   . Vitamin D deficiency    Past Surgical History  Procedure Laterality Date  . 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 Abdominal hysterectomy    . Hemorrhoid surgery    . Esophagogastroduodenoscopy  08/30/2012    Procedure: ESOPHAGOGASTRODUODENOSCOPY (EGD);  Surgeon: Jeryl Columbia, MD;  Location: [facility];  Service: Endoscopy;  Laterality: N/A;  . Radiofrequency ablation liver tumor Right 01/2013    HFA ablation right liver lesion  . Colonoscopy  06/2009    Dr. Watt Climes, due 5 years  . Endoscopic vein laser treatment Left 2014    Dr. Kellie Simmering   Social History:  reports that she quit smoking about 23 years ago. Her smoking use included Cigarettes. She started smoking about 63 years ago. She has a 200 pack-year smoking history. She has never used smokeless tobacco. She reports that she does not drink alcohol or use illicit drugs.  Allergies  Allergen Reactions  . Avelox [Moxifloxacin Hcl In Nacl] Anaphylaxis  . Statins Other (See Comments)    Liver function elevations  . Sulfa Antibiotics Hives and Itching  . Ace Inhibitors     Unknown  .  Doxycycline     Unknown  . Infed [Iron Dextran] Other (See Comments)    IV IRON, Unknown  . Infergen [Interferon Al[place]acon-1]   . Vancomycin Other (See Comments)    Flushing, erythema to neck and face, tolerated vanc at lower infusion rate  . Ciprofloxacin Other (See Comments)    Complains of sore mouth  . Fenofibrate Other (See Comments)    constipation    Family History  Problem Relation Age of Onset  . Colon cancer Sister    . Coronary artery disease Father   . Heart disease Father     Prior to Admission medications   Medication Sig Start Date End Date Taking? Authorizing Provider  acetaminophen (TYLENOL) 325 MG tablet Take 650 mg by mouth every 8 (eight) hours.   Yes Historical Provider, MD  cholecalciferol (VITAMIN D) 1000 UNITS tablet Take 1,000 Units by mouth daily.   Yes Historical Provider, MD  megestrol (MEGACE) 40 MG/ML suspension Take 10 mLs (400 mg total) by mouth 2 (two) times daily. 05/11/14  Yes Christina P [name], MD  mirtazapine (REMERON) 15 MG tablet Take 15 mg by mouth at bedtime.   Yes Historical Provider, MD  Multiple Vitamin (MULTIVITAMIN WITH MINERALS) TABS tablet Take 1 tablet by mouth daily. 05/11/14  Yes Christina P [name], MD  nystatin (MYCOSTATIN) 100000 UNIT/ML suspension Take 5 mLs (500,000 Units total) by mouth 4 (four) times daily. 05/11/14  Yes Venetia Maxon [name], MD  oxycodone (OXY-IR) 5 MG capsule Take one tablet by mouth every 6 hours as needed for pain 06/13/14  Yes Tiffany L Reed, DO  pantoprazole (PROTONIX) 40 MG tablet Take 40 mg by mouth 2 (two) times daily.   Yes Historical Provider, MD  saccharomyces boulardii (FLORASTOR) 250 MG capsule Take 1 capsule (250 mg total) by mouth 2 (two) times daily. 05/11/14  Yes Venetia Maxon [name], MD  Sodium Chloride Flush (NORMAL SALINE FLUSH) 0.9 % SOLN Inject 10 mLs into the vein every 8 (eight) hours. For uti, flush both ports   Yes Historical Provider, MD    Physical Exam: Filed Vitals:   06/14/14 2200 06/14/14 2230 06/14/14 2300 06/14/14 2330  BP: 114/52 125/60 127/69 120/60  Pulse: 114 121 109 108  Temp:      TempSrc:      Resp: 20 26 20 20   SpO2: 95% 96% 95% 94%    General: Alert, Awake and Oriented to Time, Place and Person. Appear in mild distress Eyes: PERRL ENT: Oral Mucosa clear dry. Neck: No JVD Cardiovascular: S1 and S2 Present, aortic systolic Murmur, Peripheral Pulses Present Respiratory: Bilateral Air entry equal and  Decreased, Clear to Auscultation, no Crackles, no wheezes Abdomen: Bowel Sound present, Soft and distended, diffuse tender Skin: No Rash Extremities: Trace Pedal edema, no calf tenderness Neurologic: Grossly no focal neuro deficit.  Labs on Admission:  CBC:  Recent Labs Lab 06/10/14 0640 06/14/14 1725  WBC 10.8* 13.5*  NEUTROABS 8.1* 12.2*  HGB 14.8 16.8*  HCT 42.6 48.6*  MCV 101.6* 98.6  PLT 90* 126*    CMP     Component Value Date/Time   NA 135* 06/14/2014 1725   NA 134 04/13/2014 1019   K 4.5 06/14/2014 1725   K 3.7 04/13/2014 1019   CL 96 06/14/2014 1725   CL 101 04/13/2014 1019   CO2 18* 06/14/2014 1725   CO2 29 04/13/2014 1019   GLUCOSE 132* 06/14/2014 1725   GLUCOSE 127* 04/13/2014 1019   BUN 67* 06/14/2014 1725  BUN 11 04/13/2014 1019   CREATININE 1.42* 06/14/2014 1725   CREATININE 1.20* 06/10/2014 0640   CALCIUM 9.7 06/14/2014 1725   CALCIUM 9.1 04/13/2014 1019   PROT 7.4 06/14/2014 1725   ALBUMIN 2.7* 06/14/2014 1725   AST 103* 06/14/2014 1725   ALT 92* 06/14/2014 1725   ALKPHOS 5* 06/14/2014 1725   BILITOT <0.2* 06/14/2014 1725   GFRNONAA 33* 06/14/2014 1725   GFRNONAA 58* 03/31/2014 1211   GFRAA 38* 06/14/2014 1725   GFRAA 67 03/31/2014 1211    No results found for this basename: LIPASE, AMYLASE,  in the last 168 hours  Recent Labs Lab 06/14/14 1727  AMMONIA 55    No results found for this basename: CKTOTAL, CKMB, CKMBINDEX, TROPONINI,  in the last 168 hours BNP (last 3 results)  Recent Labs  05/08/14 0525  PROBNP 71.3    Radiological Exams on Admission: Dg Chest 2 View  06/14/2014   CLINICAL DATA:  Pleural effusion, altered mental status  EXAM: CHEST  2 VIEW  COMPARISON:  06/07/2014  FINDINGS: Right-sided chest tube directed towards the apex in unchanged position. No pneumothorax. Small right pleural effusion.  The left lung is clear.  No left pleural effusion or pneumothorax.  Stable cardiomediastinal silhouette. Thoracic aortic atherosclerosis. Unremarkable  osseous structures.  IMPRESSION: 1. Right-sided chest tube in satisfactory position without a pneumothorax. Small right pleural effusion.   Electronically Signed   By: Kathreen Devoid   On: 06/14/2014 18:36   Ct Abdomen Pelvis W Contrast  06/14/2014   CLINICAL DATA:  Abdominal pain.  EXAM: CT ABDOMEN AND PELVIS WITH CONTRAST  TECHNIQUE: Multidetector CT imaging of the abdomen and pelvis was performed using the standard protocol following bolus administration of intravenous contrast.  CONTRAST:  3mL OMNIPAQUE IOHEXOL 300 MG/ML  SOLN  COMPARISON:  04/28/2014  FINDINGS: BODY WALL: Unremarkable.  LOWER CHEST: Large sliding-type hiatal hernia. Avid enhancement of the lower esophagus compatible with varices. There is a small right pleural effusion with pleural drain that appears well positioned. Mild dependent atelectasis.  ABDOMEN/PELVIS:  Liver: Cirrhotic liver morphology. Stable appearance of subcapsular mass in the inferior right liver measuring 25 mm in maximal diameter, a hepatoma ablation site. On delayed phase in segment 6 there is a 10 mm area of low density (image 8) not seen on the earlier phase. On the same image, more medially is a chronic 7 mm low-density nodule on delayed phase  Biliary: Distended gallbladder. No evidence of active inflammation or calcified stone.  Pancreas: Unremarkable.  Spleen: No splenomegaly.  Prominent splenule near the hilum.  Adrenals: Unremarkable.  Kidneys and ureters: Mild renal cortical thinning. No hydronephrosis or inflammatory change.  Bladder: Decompressed by Foley catheter.  Reproductive: Hysterectomy.  Ovaries unremarkable for age.  Bowel: IMA varices with hemorrhoids. There is diffuse thickening of of the distal colonic wall, best seen in the descending portion. No bowel obstruction. No pericecal inflammation.  Retroperitoneum: Retroperitoneal edema which is chronic and likely from portal hypertension. Chronic mild enlargement of lymph nodes within the upper abdominal  ligaments, likely reactive to the patient's cirrhosis.  Peritoneum: Small volume ascites.  Vascular: No acute abnormality.  OSSEOUS: No acute abnormalities.  IMPRESSION: 1. Colonic wall thickening which could be from mild colitis or venous congestion. 2. Cirrhosis with portal hypertension and treated hepatoma. Newly seen 1 cm nodule in segment 6 on delayed phase. Recommend nonemergent liver MRI to evaluate for new hepatocellular carcinoma. 3. Chronic findings described above.   Electronically Signed   By:  [name] M.D.   On: 06/14/2014 22:20    Assessment/Plan Principal Problem:   Colitis Active Problems:   Hypertension   Cirrhosis   Portal hypertension   Hepatoma   Dehydration   Recurrent right pleural effusion   Oral thrush   Protein-calorie malnutrition, severe   Abdominal pain   1. Colitis The patient is presenting with complaints of abdominal pain with generalized weakness. A CT scan of the abdomen was performed which isn't showing circumferential thickening suggesting colitis. Patient recently had C. difficile colitis but she does not have any balding dilatation as well as any diarrhea recently therefore possibility of C. difficile is less likely. With this I would treat her with Unasyn due to her allergy with Cipro. I will give her IV hydration. IV nausea medication IV morphine for pain as needed.  2. Abdominal distention Pleural effusion Patient has a Pleurx catheter, would continue drainage every night. CT scan is not showing any significant ascites.  3. Dehydration. Continue IV hydration.  4. Cirrhosis and portal hypertension. At present stable. Continue to monitor.  5. Malnutrition. At present patient will be remaining n.p.o. until her pain is improving better As soon as her pain is improving patient to be started on diet.  6. Depression: At present will manage current episode of acute colitis and may start her on zoloft once her acute issue is improving.    DVT Prophylaxis: Mechanical Nutrition: N.p.o. except medication  Code Status: DNR/DNI  Family Communication: Family was present at bedside, opportunity was given to ask question and all questions were answered satisfactorily at the time of interview. Disposition: Admitted to inpatient in med-surge unit.  Author: Berle Mull, MD [facility] Pager: [phone] 06/15/2014, 12:32 AM    If 7PM-7AM, please contact night-coverage [email or web address] Password [email or web address]

## 2014-06-15 NOTE — Progress Notes (Deleted)
PROGRESS NOTE    [name] BTD:[id number] DOB: 01/12/1928 DOA: 06/14/2014 PCP: Alesia Richards, MD  HPI/Brief narrative 80 year old female with history of anemia, HTN, DM 2, recurrent diverticulitis, recent C. difficile colitis, cirrhosis with portal hypertension, right Pleurx catheter placed for pleural effusion related to hepatic disease, presented with abdominal pain, poor appetite and generalized weakness. Patient noted to have loose stools in the hospital. CT abdomen shows colonic wall thickening.   Assessment/Plan:  1. Abdominal pain: Unclear etiology. CT abdomen shows colonic wall thickening. Given prior history of C. difficile, ongoing loose stools and mild leukocytosis-rule out C. difficile. Check C. difficile and GI pathogen panel PCR. For now, continue IV Unasyn. UA-negative for UTI features. 2. Generalized weakness/failure to thrive: Secondary to multiple medical complications complicating advanced age. 3. Dehydration: IV fluids 4. Acute kidney injury: Likely prerenal. IV fluids and follow BMP. 5. Right pleural effusion secondary to hepatic disease: Status post Pleurx-drained nightly. Small pleural effusion on chest x-ray. 6. Hypertension: Controlled off medications. 7. DM 2: Check CBGs. 8. Cirrhosis with portal hypertension: Small volume ascites on CT. 9. History of C. difficile colitis: Management as above. Continue probiotics. 10. Altered mental status: Ammonia normal.? Baseline.? Secondary to acute illness. Treat above medical conditions and follow clinically. No focal deficits.   Code Status: DO NOT RESUSCITATE Family Communication: None at bedside Disposition Plan: To be determined   Consultants:  None  Procedures:  None  Antibiotics:  IV Unasyn   Subjective: Appears slightly confused-not much history available from patient. As per nursing, 2-3 loose BMs this morning.  Objective: Filed Vitals:   06/14/14 2330 06/15/14 0143 06/15/14 0432  06/15/14 1500  BP: 120/60 122/67 118/59 120/64  Pulse: 108 111 104 103  Temp:  99.4 F (37.4 C) 98.9 F (37.2 C) 98.7 F (37.1 C)  TempSrc:  Oral Oral Oral  Resp: 20 18 20 15   Height:  5\' 5"  (1.651 m)    Weight:  74.707 kg (164 lb 11.2 oz)    SpO2: 94% 96% 93% 95%    Intake/Output Summary (Last 24 hours) at 06/15/14 1714 Last data filed at 06/15/14 1330  Gross per 24 hour  Intake      0 ml  Output    650 ml  Net   -650 ml   Filed Weights   06/15/14 0143  Weight: 74.707 kg (164 lb 11.2 oz)     Exam:  General exam: Elderly frail chronically ill-looking female lying comfortably in bed Respiratory system: Poor inspiratory effort, diminished breath sounds in the bases but otherwise clear to auscultation. No increased work of breathing. Right Pleurx intact. Cardiovascular system: S1 & S2 heard, RRR. No JVD, murmurs, gallops, clicks or pedal edema. Gastrointestinal system: Abdomen is mildly distended, soft and nontender. Normal bowel sounds heard. Central nervous system: Alert and oriented. No focal neurological deficits. Extremities: Symmetric 5 x 5 power.   Data Reviewed: Basic Metabolic Panel:  Recent Labs Lab 06/10/14 0640 06/14/14 1725 06/15/14 0045 06/15/14 0419  NA 141 135* 136* 137  K 4.6 4.5 4.2 4.1  CL 109 96 103 103  CO2 20 18* 16* 18*  GLUCOSE 111* 132* 135* 133*  BUN 51* 67* 66* 68*  CREATININE 1.20* 1.42* 1.26* 1.30*  CALCIUM 9.4 9.7 9.1 9.0   Liver Function Tests:  Recent Labs Lab 06/10/14 0640 06/14/14 1725 06/15/14 0045 06/15/14 0419  AST 78* 103* 78* 74*  ALT 71* 92* 77* 72*  ALKPHOS 246* 5* 333* 269*  BILITOT 0.8 <  0.2* 1.8* 1.6*  PROT 6.4 7.4 6.4 6.1  ALBUMIN 2.5* 2.7* 2.3* 2.2*   No results found for this basename: LIPASE, AMYLASE,  in the last 168 hours  Recent Labs Lab 06/14/14 1727  AMMONIA 55   CBC:  Recent Labs Lab 06/10/14 0640 06/14/14 1725 06/15/14 0419  WBC 10.8* 13.5* 11.0*  NEUTROABS 8.1* 12.2*  --   HGB  14.8 16.8* 14.9  HCT 42.6 48.6* 44.7  MCV 101.6* 98.6 100.0  PLT 90* 126* PLATELET CLUMPS NOTED ON SMEAR, COUNT APPEARS ADEQUATE   Cardiac Enzymes: No results found for this basename: CKTOTAL, CKMB, CKMBINDEX, TROPONINI,  in the last 168 hours BNP (last 3 results)  Recent Labs  05/08/14 0525  PROBNP 71.3   CBG:  Recent Labs Lab 06/14/14 1650  GLUCAP 129*    Recent Results (from the past 240 hour(s))  URINE CULTURE     Status: None   Collection Time    06/06/14  3:07 PM      Result Value Ref Range Status   Specimen Description URINE, RANDOM   Final   Special Requests NONE   Final   Culture  Setup Time     Final   Value: 06/06/2014 16:21     Performed at [facility]     Final   Value: >=100,000 COLONIES/ML     Performed at [facility]   Culture     Final   Value: KLEBSIELLA PNEUMONIAE     Note: Two isolates with different morphologies were identified as the same organism.The most resistant organism was reported.     Performed at [facility]   Report Status 06/09/2014 FINAL   Final   Organism ID, Bacteria KLEBSIELLA PNEUMONIAE   Final           Studies: Dg Chest 2 View  06/14/2014   CLINICAL DATA:  Pleural effusion, altered mental status  EXAM: CHEST  2 VIEW  COMPARISON:  06/07/2014  FINDINGS: Right-sided chest tube directed towards the apex in unchanged position. No pneumothorax. Small right pleural effusion.  The left lung is clear.  No left pleural effusion or pneumothorax.  Stable cardiomediastinal silhouette. Thoracic aortic atherosclerosis. Unremarkable osseous structures.  IMPRESSION: 1. Right-sided chest tube in satisfactory position without a pneumothorax. Small right pleural effusion.   Electronically Signed   By: Kathreen Devoid   On: 06/14/2014 18:36   Ct Abdomen Pelvis W Contrast  06/14/2014   CLINICAL DATA:  Abdominal pain.  EXAM: CT ABDOMEN AND PELVIS WITH CONTRAST  TECHNIQUE: Multidetector CT imaging of the  abdomen and pelvis was performed using the standard protocol following bolus administration of intravenous contrast.  CONTRAST:  52mL OMNIPAQUE IOHEXOL 300 MG/ML  SOLN  COMPARISON:  04/28/2014  FINDINGS: BODY WALL: Unremarkable.  LOWER CHEST: Large sliding-type hiatal hernia. Avid enhancement of the lower esophagus compatible with varices. There is a small right pleural effusion with pleural drain that appears well positioned. Mild dependent atelectasis.  ABDOMEN/PELVIS:  Liver: Cirrhotic liver morphology. Stable appearance of subcapsular mass in the inferior right liver measuring 25 mm in maximal diameter, a hepatoma ablation site. On delayed phase in segment 6 there is a 10 mm area of low density (image 8) not seen on the earlier phase. On the same image, more medially is a chronic 7 mm low-density nodule on delayed phase  Biliary: Distended gallbladder. No evidence of active inflammation or calcified stone.  Pancreas: Unremarkable.  Spleen: No splenomegaly.  Prominent  splenule near the hilum.  Adrenals: Unremarkable.  Kidneys and ureters: Mild renal cortical thinning. No hydronephrosis or inflammatory change.  Bladder: Decompressed by Foley catheter.  Reproductive: Hysterectomy.  Ovaries unremarkable for age.  Bowel: IMA varices with hemorrhoids. There is diffuse thickening of of the distal colonic wall, best seen in the descending portion. No bowel obstruction. No pericecal inflammation.  Retroperitoneum: Retroperitoneal edema which is chronic and likely from portal hypertension. Chronic mild enlargement of lymph nodes within the upper abdominal ligaments, likely reactive to the patient's cirrhosis.  Peritoneum: Small volume ascites.  Vascular: No acute abnormality.  OSSEOUS: No acute abnormalities.  IMPRESSION: 1. Colonic wall thickening which could be from mild colitis or venous congestion. 2. Cirrhosis with portal hypertension and treated hepatoma. Newly seen 1 cm nodule in segment 6 on delayed phase.  Recommend nonemergent liver MRI to evaluate for new hepatocellular carcinoma. 3. Chronic findings described above.   Electronically Signed   By: [name] M.D.   On: 06/14/2014 22:20        Scheduled Meds: . ampicillin-sulbactam (UNASYN) IV  3 g Intravenous 3 times per day  . mirtazapine  15 mg Oral QHS  . nystatin  5 mL Oral QID  . pantoprazole  40 mg Oral BID AC  . saccharomyces boulardii  250 mg Oral BID   Continuous Infusions: . sodium chloride 75 mL/hr at 06/15/14 0148    Principal Problem:   Colitis Active Problems:   Hypertension   Cirrhosis   Portal hypertension   Hepatoma   Dehydration   Recurrent right pleural effusion   Oral thrush   Protein-calorie malnutrition, severe   Abdominal pain    Time spent: 45 minutes.    Vernell Leep, MD, FACP, FHM. [facility] Pager [phone]  If 7PM-7AM, please contact night-coverage [email or web address] Password [email or web address] 06/15/2014, 5:14 PM    LOS: 1 day

## 2014-06-15 NOTE — Progress Notes (Signed)
INITIAL NUTRITION ASSESSMENT  DOCUMENTATION CODES Per approved criteria  -Non-severe (moderate) malnutrition in the context of chronic illness  Pt meets criteria for moderate MALNUTRITION in the context of chronic illness as evidenced by moderate muscle wasting and subcutaneous fat loss in occipital, clavicle, and temporal regions, 7.3% body weight loss in 3 months.   INTERVENTION: -Recommend Ensure Complete po BID, each supplement provides 350 kcal and 13 grams of protein with diet advancement -Recommend (Vegetarian) Dys3/chopped as tolerated -Consider addition of Megace as warranted (was on 400 mg BID on previous admit in 04/2014) -RD to continue to monitor  NUTRITION DIAGNOSIS: Inadequate oral intake related to abd distension/weakness as evidenced by decreased PO intake, refusing meals.   Goal: Pt to meet >/= 90% of their estimated nutrition needs    Monitor:  Diet order, total protein/energy intake, labs, weights, GI profile, swallow profile  Reason for Assessment: MST/Low Braden  80 y.o. female  Admitting Dx: Colitis  ASSESSMENT: The patient is presenting with complaints of abdominal pain and generalized weakness. Since last 2 days patient has been having progressively decreasing appetite with decreasing activity and generalized weakness and as per the daughter she has not been able to participate adequately in her physical therapy.  -Pt's granddaughter reported pt has had improved appetite at SNF. Has been eating balanced meals regularly with Ensure supplementation. Pt tolerates soft foods, with some foods chopped. Pt is strict vegetarian, does not eat eggs, fish, chicken or red meat. Will honor pt's diet as tolerated -Appetite has decreased two days ago d/t weakness, abd distension, and has started to refuse meals -Has hx of severe malnutrition during previous admit in 04/2014; required TPN and was on Megace 400 mg BID. TPN as d/c'd, supplements and snacks were encouraged upon  discharge -Endorsed weight loss, family unable to quantify amount; however granddaughter noted severe muscle wasting and subcutaneous fat loss particularly around temporal, clavicle, occipital regions.  -Previous medical records indicate pt lost 12 lbs in past three months (7.3% body weight loss, significant for time frame) -Pt on clear liquid d/t possible colitis per MD. Will continue to monitor and modify diet and add supplements as tolerated  Height: Ht Readings from Last 1 Encounters:  06/15/14 5\' 5"  (1.651 m)    Weight: Wt Readings from Last 1 Encounters:  06/15/14 164 lb 11.2 oz (74.707 kg)    Ideal Body Weight: 125 lb  % Ideal Body Weight: 131%  Wt Readings from Last 10 Encounters:  06/15/14 164 lb 11.2 oz (74.707 kg)  06/14/14 165 lb (74.844 kg)  06/09/14 162 lb (73.483 kg)  04/25/14 171 lb 8 oz (77.792 kg)  04/17/14 176 lb 0.2 oz (79.84 kg)  04/13/14 176 lb (79.833 kg)  03/31/14 181 lb (82.101 kg)  01/20/14 182 lb 12.8 oz (82.918 kg)  12/16/13 174 lb 3.2 oz (79.017 kg)  11/29/13 173 lb 6.4 oz (78.654 kg)    Usual Body Weight: 176 lb  % Usual Body Weight: 93%  BMI:  Body mass index is 27.41 kg/(m^2). Overweight  Estimated Nutritional Needs: Kcal: 1800-2000 Protein: 85-95 gram Fluid: >/=1800 ml/daily  Skin: WDL  Diet Order: Clear Liquid  EDUCATION NEEDS: -No education needs identified at this time   Intake/Output Summary (Last 24 hours) at 06/15/14 2041 Last data filed at 06/15/14 1330  Gross per 24 hour  Intake      0 ml  Output    650 ml  Net   -650 ml    Last BM: 9/23  Labs:   Recent Labs Lab 06/14/14 1725 06/15/14 0045 06/15/14 0419  NA 135* 136* 137  K 4.5 4.2 4.1  CL 96 103 103  CO2 18* 16* 18*  BUN 67* 66* 68*  CREATININE 1.42* 1.26* 1.30*  CALCIUM 9.7 9.1 9.0  GLUCOSE 132* 135* 133*    CBG (last 3)   Recent Labs  06/14/14 1650  GLUCAP 129*    Scheduled Meds: . ampicillin-sulbactam (UNASYN) IV  3 g Intravenous 3  times per day  . mirtazapine  7.5 mg Oral QHS  . nystatin  5 mL Oral QID  . pantoprazole  40 mg Oral BID AC  . saccharomyces boulardii  250 mg Oral BID    Continuous Infusions: . sodium chloride 100 mL/hr at 06/15/14 1730    Past Medical History  Diagnosis Date  . Anemia, iron deficiency 08/07/2011  . Angiodysplasia of intestinal tract 08/07/2011  . Hypertension   . Arthritis   . Diverticulitis   . Phlebitis   . Macular degeneration   . TIA (transient ischemic attack)   . MI (myocardial infarction)   . Hiatal hernia   . AVM (arteriovenous malformation) of colon with hemorrhage   . Varicose veins of legs   . Cataract   . GERD (gastroesophageal reflux disease)   . DJD (degenerative joint disease)   . Diabetes mellitus     borderline  . History of blood transfusion   . PONV (postoperative nausea and vomiting)   . Edema leg   . Fatty liver   . Vitamin D deficiency   . hepatocellar ca dx'd 08/2012    Past Surgical History  Procedure Laterality Date  . 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 Abdominal hysterectomy    . Hemorrhoid surgery    . Esophagogastroduodenoscopy  08/30/2012    Procedure: ESOPHAGOGASTRODUODENOSCOPY (EGD);  Surgeon: Jeryl Columbia, MD;  Location: [facility];  Service: Endoscopy;  Laterality: N/A;  . Radiofrequency ablation liver tumor Right 01/2013    HFA ablation right liver lesion  . Colonoscopy  06/2009    Dr. Watt Climes, due 5 years  . Endoscopic vein laser treatment Left 2014    Dr. Beryl Meager MS RD LDN Clinical Dietitian [phone]

## 2014-06-16 DIAGNOSIS — E860 Dehydration: Secondary | ICD-10-CM

## 2014-06-16 DIAGNOSIS — N179 Acute kidney failure, unspecified: Secondary | ICD-10-CM

## 2014-06-16 DIAGNOSIS — R5383 Other fatigue: Secondary | ICD-10-CM

## 2014-06-16 DIAGNOSIS — A0472 Enterocolitis due to Clostridium difficile, not specified as recurrent: Secondary | ICD-10-CM | POA: Diagnosis present

## 2014-06-16 DIAGNOSIS — R5381 Other malaise: Secondary | ICD-10-CM

## 2014-06-16 LAB — COMPREHENSIVE METABOLIC PANEL
ALBUMIN: 2 g/dL — AB (ref 3.5–5.2)
ALT: 74 U/L — AB (ref 0–35)
AST: 76 U/L — AB (ref 0–37)
Alkaline Phosphatase: 229 U/L — ABNORMAL HIGH (ref 39–117)
Anion gap: 16 — ABNORMAL HIGH (ref 5–15)
BUN: 77 mg/dL — ABNORMAL HIGH (ref 6–23)
CO2: 17 mEq/L — ABNORMAL LOW (ref 19–32)
Calcium: 8.5 mg/dL (ref 8.4–10.5)
Chloride: 110 mEq/L (ref 96–112)
Creatinine, Ser: 1.26 mg/dL — ABNORMAL HIGH (ref 0.50–1.10)
GFR, EST AFRICAN AMERICAN: 44 mL/min — AB (ref >90)
GFR, EST NON AFRICAN AMERICAN: 38 mL/min — AB (ref >90)
Glucose, Bld: 120 mg/dL — ABNORMAL HIGH (ref 70–99)
Potassium: 3.8 mEq/L (ref 3.7–5.3)
SODIUM: 143 meq/L (ref 137–147)
TOTAL PROTEIN: 5.8 g/dL — AB (ref 6.0–8.3)
Total Bilirubin: 1.3 mg/dL — ABNORMAL HIGH (ref 0.3–1.2)

## 2014-06-16 LAB — CBC
HCT: 43.4 % (ref 36.0–46.0)
Hemoglobin: 14.8 g/dL (ref 12.0–15.0)
MCH: 33.8 pg (ref 26.0–34.0)
MCHC: 34.1 g/dL (ref 30.0–36.0)
MCV: 99.1 fL (ref 78.0–100.0)
Platelets: 101 10*3/uL — ABNORMAL LOW (ref 150–400)
RBC: 4.38 MIL/uL (ref 3.87–5.11)
RDW: 17.6 % — AB (ref 11.5–15.5)
WBC: 7.7 10*3/uL (ref 4.0–10.5)

## 2014-06-16 LAB — GLUCOSE, CAPILLARY
Glucose-Capillary: 116 mg/dL — ABNORMAL HIGH (ref 70–99)
Glucose-Capillary: 115 mg/dL — ABNORMAL HIGH (ref 70–99)
Glucose-Capillary: 108 mg/dL — ABNORMAL HIGH (ref 70–99)
Glucose-Capillary: 117 mg/dL — ABNORMAL HIGH (ref 70–99)

## 2014-06-16 LAB — GI PATHOGEN PANEL BY PCR, STOOL
C difficile toxin A/B: POSITIVE
CRYPTOSPORIDIUM BY PCR: NEGATIVE
Campylobacter by PCR: NEGATIVE
E COLI 0157 BY PCR: NEGATIVE
E coli (ETEC) LT/ST: NEGATIVE
E coli (STEC): NEGATIVE
G LAMBLIA BY PCR: NEGATIVE
NOROVIRUS G1/G2: NEGATIVE
Rotavirus A by PCR: NEGATIVE
SHIGELLA BY PCR: NEGATIVE
Salmonella by PCR: NEGATIVE

## 2014-06-16 LAB — CLOSTRIDIUM DIFFICILE BY PCR: CDIFFPCR: POSITIVE — AB

## 2014-06-16 MED ORDER — VANCOMYCIN 50 MG/ML ORAL SOLUTION
125.0000 mg | Freq: Four times a day (QID) | ORAL | Status: DC
  Administered 2014-06-16 – 2014-06-19 (×12): 125 mg via ORAL
  Filled 2014-06-16 (×16): qty 2.5

## 2014-06-16 MED ORDER — FAMOTIDINE 20 MG PO TABS
20.0000 mg | ORAL_TABLET | Freq: Two times a day (BID) | ORAL | Status: DC
  Administered 2014-06-16 – 2014-06-22 (×13): 20 mg via ORAL
  Filled 2014-06-16 (×14): qty 1

## 2014-06-16 MED ORDER — SODIUM CHLORIDE 0.9 % IV SOLN
INTRAVENOUS | Status: DC
  Administered 2014-06-16 – 2014-06-17 (×2): via INTRAVENOUS

## 2014-06-16 NOTE — Progress Notes (Signed)
CSW continuing to follow for disposition planning.   Pt admitted from [facility] and plan is for pt to return upon being medically stable for discharge.  CSW visited pt room. Pt resting at this time. No family present at bedside.  CSW contacted pt daughter, Laurita Quint via telephone and left voice message.  CSW updated [facility] that pt C.Diff PCR positive and pt now on isolation precautions for C. Diff. [facility] confirmed that they will be able to accept pt back to facility. CSW sent updated clinicals.  CSW to continue to follow to provide support and assist with pt discharge planning needs.   Alison Murray, MSW, [place] [employer] [phone]

## 2014-06-16 NOTE — Evaluation (Signed)
Physical Therapy Evaluation Patient Details Name: [name] MRN: [id number] DOB: [birth year] Today's Date: 06/16/2014   History of Present Illness  80 year old female with history of anemia, HTN, DM 2, recurrent diverticulitis, cirrhosis with portal hypertension, right Pleurx catheter placed for pleural effusion related to hepatic disease, admitted with abdominal pain, c. diff and generalized weakness.  Clinical Impression  Pt currently with functional limitations due to the deficits listed below (see PT Problem List).  Pt will benefit from skilled PT to increase their independence and safety with mobility to allow discharge to the venue listed below.  Pt requiring extensive assist for bed mobility and did not feel able to stand today.  Pt also expressed, "this is no way to live."     Follow Up Recommendations SNF;Supervision/Assistance - 24 hour    Equipment Recommendations  None recommended by PT    Recommendations for Other Services       Precautions / Restrictions Precautions Precautions: Fall      Mobility  Bed Mobility Overal bed mobility: Needs Assistance;+2 for physical assistance Bed Mobility: Supine to Sit;Sit to Supine;Rolling Rolling: Max assist;+2 for physical assistance   Supine to sit: Max assist;+2 for physical assistance Sit to supine: Max assist;+2 for physical assistance   General bed mobility comments: pt inititated movement however required assist to complete bed mobility, utilized bed pad for scoot hips  Transfers Overall transfer level:  (pt felt unable)                  Ambulation/Gait                Stairs            Wheelchair Mobility    Modified Rankin (Stroke Patients Only)       Balance Overall balance assessment: Needs assistance Sitting-balance support: Feet supported;Bilateral upper extremity supported Sitting balance-Leahy Scale: Poor Sitting balance - Comments: requires min assist for trunk support when  sitting EOB, fatigued very quickly and requested return to supine                                     Pertinent Vitals/Pain Pain Assessment: Faces Pain Score: 6  Pain Location: abdominal Pain Descriptors / Indicators: Constant;Discomfort Pain Intervention(s): Monitored during session;Repositioned    Home Living Family/patient expects to be discharged to:: Skilled nursing facility                      Prior Function Level of Independence: Needs assistance   Gait / Transfers Assistance Needed: from SNF     Comments: a month ago was from independent living, now requiring increased assist     Hand Dominance        Extremity/Trunk Assessment   Upper Extremity Assessment: Generalized weakness           Lower Extremity Assessment: Generalized weakness         Communication   Communication: HOH  Cognition Arousal/Alertness: Awake/alert Behavior During Therapy: Flat affect Overall Cognitive Status: No family/caregiver present to determine baseline cognitive functioning                      General Comments      Exercises        Assessment/Plan    PT Assessment Patient needs continued PT services  PT Diagnosis Generalized weakness   PT Problem List Decreased strength;Decreased activity  tolerance;Decreased mobility;Decreased knowledge of use of DME;Decreased balance  PT Treatment Interventions DME instruction;Gait training;Functional mobility training;Therapeutic activities;Therapeutic exercise;Patient/family education;Balance training   PT Goals (Current goals can be found in the Care Plan section) Acute Rehab PT Goals PT Goal Formulation: With patient Time For Goal Achievement: 06/30/14 Potential to Achieve Goals: Fair    Frequency Min 3X/week   Barriers to discharge        Co-evaluation               End of Session   Activity Tolerance: Patient limited by fatigue;Patient limited by pain Patient left: in  bed;with call bell/phone within reach;with bed alarm set Nurse Communication:  (foul odor coming from sacral dressing)         Time: 4970-2637 PT Time Calculation (min): 15 min   Charges:   PT Evaluation $Initial PT Evaluation Tier I: 1 Procedure PT Treatments $Therapeutic Activity: 8-22 mins   PT G Codes:          Izaiah Tabb,KATHrine E 06/16/2014, 2:35 PM Carmelia Bake, PT, DPT 06/16/2014 Pager: [phone]

## 2014-06-16 NOTE — Progress Notes (Signed)
PROGRESS NOTE    [name] TIW:[id number] DOB: 24-Oct-1927 DOA: 06/14/2014 PCP: Alesia Richards, MD  HPI/Brief narrative 80 year old female with history of anemia, HTN, DM 2, recurrent diverticulitis, recent C. difficile colitis, cirrhosis with portal hypertension, right Pleurx catheter placed for pleural effusion related to hepatic disease, presented with abdominal pain, poor appetite and generalized weakness. Patient noted to have loose stools in the hospital. CT abdomen shows colonic wall thickening. C. difficile positive.   Assessment/Plan:  1. Abdominal pain/C. difficile colitis: CT abdomen shows colonic wall thickening. Given prior history of C. difficile, ongoing loose stools and mild leukocytosis-there was high index of suspicion for recurrent C. difficile. C. difficile PCR positive. She had a negative PCR on 06/01/2014. DC empirically started IV Unasyn. Start oral vancomycin-first recurrence-? Taper. UA-negative for UTI features. Avoid unnecessary antibiotics and consider DC PPIs. Continue probiotics. 2. Generalized weakness/failure to thrive: Secondary to multiple medical complications complicating advanced age. 3. Dehydration: IV fluids-continue for additional 24 hours. Clinically better. 4. Acute kidney injury: Likely prerenal. IV fluids and follow BMP. Creatinine slightly better. 5. Right pleural effusion secondary to hepatic disease: Status post Pleurx-drained nightly. Small pleural effusion on chest x-ray. 6. Hypertension: Controlled off medications. 7. DM 2: Check CBGs. Controlled. 8. Cirrhosis with portal hypertension: Small volume ascites on CT. 9. Altered mental status/acute encephalopathy: Ammonia normal.? Baseline.? Secondary to acute illness complicating possible underlying dementia. Treat above medical conditions and follow clinically. No focal deficits. Patient definitely much more alert this morning but confused. Did cut back on pain medications and  Remeron. 10. Thrombocytopenia: May be secondary to cirrhosis. No reported bleeding. Follow CBCs.   Code Status: DO NOT RESUSCITATE Family Communication: Discussed with daughter Ms. Laurita Quint 9/22 Disposition Plan: Return to SNF when medically stable.   Consultants:  None  Procedures:  None  Antibiotics:  IV Unasyn -discontinued 9/24  PO vancomycin 9/24 >  Subjective: Patient alert but confused. Denied abdominal pain. As per nursing, had a large loose BM last night.  Objective: Filed Vitals:   06/15/14 0432 06/15/14 1500 06/15/14 2144 06/16/14 0637  BP: 118/59 120/64 126/64 134/66  Pulse: 104 103 101 109  Temp: 98.9 F (37.2 C) 98.7 F (37.1 C) 97.8 F (36.6 C) 98.1 F (36.7 C)  TempSrc: Oral Oral Oral Oral  Resp: 20 15 18 20   Height:      Weight:      SpO2: 93% 95% 95% 92%    Intake/Output Summary (Last 24 hours) at 06/16/14 1039 Last data filed at 06/16/14 1007  Gross per 24 hour  Intake   2372 ml  Output   1529 ml  Net    843 ml   Filed Weights   06/15/14 0143  Weight: 74.707 kg (164 lb 11.2 oz)     Exam:  General exam: Elderly frail chronically ill-looking female lying comfortably in bed. Appears much improved compared to yesterday. Alert and confused. Respiratory system: Poor inspiratory effort, diminished breath sounds in the bases but otherwise clear to auscultation. No increased work of breathing. Right Pleurx intact. Cardiovascular system: S1 & S2 heard, RRR. No JVD, murmurs, gallops, clicks or pedal edema. Gastrointestinal system: Abdomen is mildly distended, soft and nontender. Normal bowel sounds heard. Central nervous system: Alert and oriented only to self. No focal neurological deficits. Extremities: Symmetric 5 x 5 power.   Data Reviewed: Basic Metabolic Panel:  Recent Labs Lab 06/10/14 0640 06/14/14 1725 06/15/14 0045 06/15/14 0419 06/16/14 0433  NA 141 135* 136* 137 143  K 4.6 4.5 4.2 4.1 3.8  CL 109 96 103 103 110   CO2 20 18* 16* 18* 17*  GLUCOSE 111* 132* 135* 133* 120*  BUN 51* 67* 66* 68* 77*  CREATININE 1.20* 1.42* 1.26* 1.30* 1.26*  CALCIUM 9.4 9.7 9.1 9.0 8.5   Liver Function Tests:  Recent Labs Lab 06/10/14 0640 06/14/14 1725 06/15/14 0045 06/15/14 0419 06/16/14 0433  AST 78* 103* 78* 74* 76*  ALT 71* 92* 77* 72* 74*  ALKPHOS 246* 5* 333* 269* 229*  BILITOT 0.8 <0.2* 1.8* 1.6* 1.3*  PROT 6.4 7.4 6.4 6.1 5.8*  ALBUMIN 2.5* 2.7* 2.3* 2.2* 2.0*   No results found for this basename: LIPASE, AMYLASE,  in the last 168 hours  Recent Labs Lab 06/14/14 1727  AMMONIA 55   CBC:  Recent Labs Lab 06/10/14 0640 06/14/14 1725 06/15/14 0419 06/16/14 0433  WBC 10.8* 13.5* 11.0* 7.7  NEUTROABS 8.1* 12.2*  --   --   HGB 14.8 16.8* 14.9 14.8  HCT 42.6 48.6* 44.7 43.4  MCV 101.6* 98.6 100.0 99.1  PLT 90* 126* PLATELET CLUMPS NOTED ON SMEAR, COUNT APPEARS ADEQUATE 101*   Cardiac Enzymes: No results found for this basename: CKTOTAL, CKMB, CKMBINDEX, TROPONINI,  in the last 168 hours BNP (last 3 results)  Recent Labs  05/08/14 0525  PROBNP 71.3   CBG:  Recent Labs Lab 06/14/14 1650 06/16/14 0321  GLUCAP 129* 116*    Recent Results (from the past 240 hour(s))  URINE CULTURE     Status: None   Collection Time    06/06/14  3:07 PM      Result Value Ref Range Status   Specimen Description URINE, RANDOM   Final   Special Requests NONE   Final   Culture  Setup Time     Final   Value: 06/06/2014 16:21     Performed at [facility] Count     Final   Value: >=100,000 COLONIES/ML     Performed at [facility]   Culture     Final   Value: KLEBSIELLA PNEUMONIAE     Note: Two isolates with different morphologies were identified as the same organism.The most resistant organism was reported.     Performed at [facility]   Report Status 06/09/2014 FINAL   Final   Organism ID, Bacteria KLEBSIELLA PNEUMONIAE   Final  CLOSTRIDIUM DIFFICILE BY PCR      Status: Abnormal   Collection Time    06/15/14  3:05 PM      Result Value Ref Range Status   C difficile by pcr POSITIVE (*) NEGATIVE Final   Comment: CRITICAL RESULT CALLED TO, READ BACK BY AND VERIFIED WITH:     HOLLAND C RN 06/16/14 0838 COSTELLO B     Performed at [facility]           Studies: Dg Chest 2 View  06/14/2014   CLINICAL DATA:  Pleural effusion, altered mental status  EXAM: CHEST  2 VIEW  COMPARISON:  06/07/2014  FINDINGS: Right-sided chest tube directed towards the apex in unchanged position. No pneumothorax. Small right pleural effusion.  The left lung is clear.  No left pleural effusion or pneumothorax.  Stable cardiomediastinal silhouette. Thoracic aortic atherosclerosis. Unremarkable osseous structures.  IMPRESSION: 1. Right-sided chest tube in satisfactory position without a pneumothorax. Small right pleural effusion.   Electronically Signed   By: Kathreen Devoid   On: 06/14/2014 18:36   Ct  Abdomen Pelvis W Contrast  06/14/2014   CLINICAL DATA:  Abdominal pain.  EXAM: CT ABDOMEN AND PELVIS WITH CONTRAST  TECHNIQUE: Multidetector CT imaging of the abdomen and pelvis was performed using the standard protocol following bolus administration of intravenous contrast.  CONTRAST:  56mL OMNIPAQUE IOHEXOL 300 MG/ML  SOLN  COMPARISON:  04/28/2014  FINDINGS: BODY WALL: Unremarkable.  LOWER CHEST: Large sliding-type hiatal hernia. Avid enhancement of the lower esophagus compatible with varices. There is a small right pleural effusion with pleural drain that appears well positioned. Mild dependent atelectasis.  ABDOMEN/PELVIS:  Liver: Cirrhotic liver morphology. Stable appearance of subcapsular mass in the inferior right liver measuring 25 mm in maximal diameter, a hepatoma ablation site. On delayed phase in segment 6 there is a 10 mm area of low density (image 8) not seen on the earlier phase. On the same image, more medially is a chronic 7 mm low-density nodule on delayed phase   Biliary: Distended gallbladder. No evidence of active inflammation or calcified stone.  Pancreas: Unremarkable.  Spleen: No splenomegaly.  Prominent splenule near the hilum.  Adrenals: Unremarkable.  Kidneys and ureters: Mild renal cortical thinning. No hydronephrosis or inflammatory change.  Bladder: Decompressed by Foley catheter.  Reproductive: Hysterectomy.  Ovaries unremarkable for age.  Bowel: IMA varices with hemorrhoids. There is diffuse thickening of of the distal colonic wall, best seen in the descending portion. No bowel obstruction. No pericecal inflammation.  Retroperitoneum: Retroperitoneal edema which is chronic and likely from portal hypertension. Chronic mild enlargement of lymph nodes within the upper abdominal ligaments, likely reactive to the patient's cirrhosis.  Peritoneum: Small volume ascites.  Vascular: No acute abnormality.  OSSEOUS: No acute abnormalities.  IMPRESSION: 1. Colonic wall thickening which could be from mild colitis or venous congestion. 2. Cirrhosis with portal hypertension and treated hepatoma. Newly seen 1 cm nodule in segment 6 on delayed phase. Recommend nonemergent liver MRI to evaluate for new hepatocellular carcinoma. 3. Chronic findings described above.   Electronically Signed   By: [name] M.D.   On: 06/14/2014 22:20        Scheduled Meds: . mirtazapine  7.5 mg Oral QHS  . nystatin  5 mL Oral QID  . pantoprazole  40 mg Oral BID AC  . saccharomyces boulardii  250 mg Oral BID  . vancomycin  125 mg Oral 4 times per day   Continuous Infusions: . sodium chloride 75 mL/hr at 06/16/14 1015    Principal Problem:   Colitis Active Problems:   Hypertension   Cirrhosis   Portal hypertension   Hepatoma   Dehydration   Recurrent right pleural effusion   Oral thrush   Protein-calorie malnutrition, severe   Abdominal pain    Time spent: 35 minutes.    Vernell Leep, MD, FACP, FHM. [facility] Pager [phone]  If 7PM-7AM, please  contact night-coverage [email or web address] Password [facility] 06/16/2014, 10:39 AM    LOS: 2 days

## 2014-06-17 DIAGNOSIS — E876 Hypokalemia: Secondary | ICD-10-CM

## 2014-06-17 LAB — BASIC METABOLIC PANEL
Anion gap: 17 — ABNORMAL HIGH (ref 5–15)
BUN: 72 mg/dL — AB (ref 6–23)
CHLORIDE: 106 meq/L (ref 96–112)
CO2: 16 meq/L — AB (ref 19–32)
Calcium: 8.3 mg/dL — ABNORMAL LOW (ref 8.4–10.5)
Creatinine, Ser: 1.15 mg/dL — ABNORMAL HIGH (ref 0.50–1.10)
GFR calc non Af Amer: 43 mL/min — ABNORMAL LOW (ref >90)
GFR, EST AFRICAN AMERICAN: 50 mL/min — AB (ref >90)
Glucose, Bld: 93 mg/dL (ref 70–99)
POTASSIUM: 3.4 meq/L — AB (ref 3.7–5.3)
Sodium: 139 mEq/L (ref 137–147)

## 2014-06-17 LAB — GLUCOSE, CAPILLARY
GLUCOSE-CAPILLARY: 99 mg/dL (ref 70–99)
Glucose-Capillary: 88 mg/dL (ref 70–99)
Glucose-Capillary: 109 mg/dL — ABNORMAL HIGH (ref 70–99)
Glucose-Capillary: 92 mg/dL (ref 70–99)

## 2014-06-17 LAB — CBC
HEMATOCRIT: 41.1 % (ref 36.0–46.0)
HEMOGLOBIN: 14.2 g/dL (ref 12.0–15.0)
MCH: 33.3 pg (ref 26.0–34.0)
MCHC: 34.5 g/dL (ref 30.0–36.0)
MCV: 96.5 fL (ref 78.0–100.0)
Platelets: 103 10*3/uL — ABNORMAL LOW (ref 150–400)
RBC: 4.26 MIL/uL (ref 3.87–5.11)
RDW: 17.4 % — ABNORMAL HIGH (ref 11.5–15.5)
WBC: 8 10*3/uL (ref 4.0–10.5)

## 2014-06-17 MED ORDER — CETYLPYRIDINIUM CHLORIDE 0.05 % MT LIQD
7.0000 mL | Freq: Two times a day (BID) | OROMUCOSAL | Status: DC
  Administered 2014-06-17 – 2014-06-22 (×9): 7 mL via OROMUCOSAL

## 2014-06-17 MED ORDER — STERILE WATER FOR INJECTION IV SOLN
INTRAVENOUS | Status: AC
  Administered 2014-06-17 – 2014-06-18 (×2): via INTRAVENOUS
  Filled 2014-06-17 (×2): qty 850

## 2014-06-17 MED ORDER — POTASSIUM CHLORIDE CRYS ER 20 MEQ PO TBCR
40.0000 meq | EXTENDED_RELEASE_TABLET | Freq: Once | ORAL | Status: AC
Start: 2014-06-17 — End: 2014-06-17
  Administered 2014-06-17: 40 meq via ORAL
  Filled 2014-06-17: qty 2

## 2014-06-17 NOTE — Progress Notes (Signed)
Clinical Social Work  CSW continues to follow to assist with DC planning. SNF is agreeable to accept patient whenever medically stable and can admit over the weekend.  Sindy Messing, LCSW (Coverage for [employer])

## 2014-06-17 NOTE — Care Management Note (Signed)
CARE MANAGEMENT NOTE 06/17/2014  Patient:  [name], [name]   Account Number:  [id number]  Date Initiated:  06/17/2014  Documentation initiated by:  Marney Doctor  Subjective/Objective Assessment:   80 yo female admitted with C-Diff     Action/Plan:   From [facility] and wants to go back there.   Anticipated DC Date:  06/19/2014   Anticipated DC Plan:  SKILLED NURSING FACILITY  In-house referral  Clinical Social Worker      DC Planning Services  CM consult      Choice offered to / List presented to:             Status of service:  In process, will continue to follow Medicare Important Message given?  YES (If response is "NO", the following Medicare IM given date fields will be blank) Date Medicare IM given:  06/17/2014 Medicare IM given by:  Marney Doctor Date Additional Medicare IM given:   Additional Medicare IM given by:    Discharge Disposition:    Per UR Regulation:  Reviewed for med. necessity/level of care/duration of stay  If discussed at [place] of Stay Meetings, dates discussed:    Comments:  Marney Doctor RN, BSN, NCM

## 2014-06-17 NOTE — Progress Notes (Signed)
PROGRESS NOTE    [name] AJG:[id number] DOB: June 11, 1928 DOA: 06/14/2014 PCP: Alesia Richards, MD  HPI/Brief narrative 80 year old female with history of anemia, HTN, DM 2, recurrent diverticulitis, recent C. difficile colitis, cirrhosis with portal hypertension, right Pleurx catheter placed for pleural effusion related to hepatic disease, presented with abdominal pain, poor appetite and generalized weakness. Patient noted to have loose stools in the hospital. CT abdomen shows colonic wall thickening. C. difficile positive. Mental status and diarrhea improving. Possible DC to SNF in 1-2 days.   Assessment/Plan:  1. Abdominal pain/C. difficile colitis: CT abdomen shows colonic wall thickening. Given prior history of C. difficile, ongoing loose stools and mild leukocytosis-there was high index of suspicion for recurrent C. difficile. C. difficile PCR positive. She had a negative PCR on 06/01/2014. DC empirically started IV Unasyn. Start oral vancomycin-first recurrence- Complete 14 days course UA-negative for UTI features. Avoid unnecessary antibiotics and consider DC PPIs. Continue probiotics. Diarrhea said to be improving. 2. Generalized weakness/failure to thrive: Secondary to multiple medical complications complicating advanced age. 3. Dehydration: IV fluids-continue for additional 24 hours. Clinically better. 4. Acute kidney injury: Likely prerenal. IV fluids and follow BMP. Creatinine improving. We'll change IV fluids to bicarbonate drip for 24 hours secondary to bicarbonate 16.  5. Right pleural effusion secondary to hepatic disease: Status post Pleurx-drained nightly. Small pleural effusion on chest x-ray. 6. Hypertension: Controlled off medications. 7. DM 2: Check CBGs. Controlled. 8. Cirrhosis with portal hypertension: Small volume ascites on CT. 9. Altered mental status/acute encephalopathy: Ammonia normal.? Baseline.? Secondary to acute illness complicating possible  underlying dementia. Treat above medical conditions and follow clinically. No focal deficits. Did cut back on pain medications and Remeron. Mental status definitely improving per exam and family report. 10. Thrombocytopenia: May be secondary to cirrhosis. No reported bleeding. Stable 11. Hypokalemia: Replete and follow BMP. 12. Abnormal LFTs:? Secondary to cirrhosis. Stable. Follow in a.m.   Code Status: DO NOT RESUSCITATE Family Communication: Discussed with son Laverna Peace at bedside. Disposition Plan: Return to SNF possibly in the next 24-48 hours.   Consultants:  None  Procedures:  None  Antibiotics:  IV Unasyn -discontinued 9/24  PO vancomycin 9/24 >  Subjective: Patient alert but confused. Denied abdominal pain. As per nursing, decreasing diarrhea. As per son, mental status has definitely improved compared to 2 days ago.  Objective: Filed Vitals:   06/16/14 0637 06/16/14 1400 06/16/14 2107 06/17/14 0506  BP: 134/66 140/54 111/57 125/61  Pulse: 109 95 106 100  Temp: 98.1 F (36.7 C) 98.3 F (36.8 C) 97.5 F (36.4 C) 98.1 F (36.7 C)  TempSrc: Oral Oral Oral Oral  Resp: 20 18 20 20   Height:      Weight:    74.027 kg (163 lb 3.2 oz)  SpO2: 92% 96% 94% 97%    Intake/Output Summary (Last 24 hours) at 06/17/14 1541 Last data filed at 06/17/14 1418  Gross per 24 hour  Intake  765.5 ml  Output   1005 ml  Net -239.5 ml   Filed Weights   06/15/14 0143 06/17/14 0506  Weight: 74.707 kg (164 lb 11.2 oz) 74.027 kg (163 lb 3.2 oz)     Exam:  General exam: Elderly frail chronically ill-looking female sitting up comfortably in bed. Respiratory system: Poor inspiratory effort, diminished breath sounds in the bases but otherwise clear to auscultation. No increased work of breathing. Right Pleurx intact. Cardiovascular system: S1 & S2 heard, RRR. No JVD, murmurs, gallops, clicks or pedal edema.  Gastrointestinal system: Abdomen is non-distended, soft and nontender. Normal  bowel sounds heard. Central nervous system: Alert and oriented to self and partly to place. No focal neurological deficits. Extremities: Symmetric 5 x 5 power.   Data Reviewed: Basic Metabolic Panel:  Recent Labs Lab 06/14/14 1725 06/15/14 0045 06/15/14 0419 06/16/14 0433 06/17/14 0537  NA 135* 136* 137 143 139  K 4.5 4.2 4.1 3.8 3.4*  CL 96 103 103 110 106  CO2 18* 16* 18* 17* 16*  GLUCOSE 132* 135* 133* 120* 93  BUN 67* 66* 68* 77* 72*  CREATININE 1.42* 1.26* 1.30* 1.26* 1.15*  CALCIUM 9.7 9.1 9.0 8.5 8.3*   Liver Function Tests:  Recent Labs Lab 06/14/14 1725 06/15/14 0045 06/15/14 0419 06/16/14 0433  AST 103* 78* 74* 76*  ALT 92* 77* 72* 74*  ALKPHOS 5* 333* 269* 229*  BILITOT <0.2* 1.8* 1.6* 1.3*  PROT 7.4 6.4 6.1 5.8*  ALBUMIN 2.7* 2.3* 2.2* 2.0*   No results found for this basename: LIPASE, AMYLASE,  in the last 168 hours  Recent Labs Lab 06/14/14 1727  AMMONIA 55   CBC:  Recent Labs Lab 06/14/14 1725 06/15/14 0419 06/16/14 0433 06/17/14 0537  WBC 13.5* 11.0* 7.7 8.0  NEUTROABS 12.2*  --   --   --   HGB 16.8* 14.9 14.8 14.2  HCT 48.6* 44.7 43.4 41.1  MCV 98.6 100.0 99.1 96.5  PLT 126* PLATELET CLUMPS NOTED ON SMEAR, COUNT APPEARS ADEQUATE 101* 103*   Cardiac Enzymes: No results found for this basename: CKTOTAL, CKMB, CKMBINDEX, TROPONINI,  in the last 168 hours BNP (last 3 results)  Recent Labs  05/08/14 0525  PROBNP 71.3   CBG:  Recent Labs Lab 06/16/14 1149 06/16/14 1659 06/16/14 2114 06/17/14 0733 06/17/14 1227  GLUCAP 115* 108* 117* 88 109*    Recent Results (from the past 240 hour(s))  CLOSTRIDIUM DIFFICILE BY PCR     Status: Abnormal   Collection Time    06/15/14  3:05 PM      Result Value Ref Range Status   C difficile by pcr POSITIVE (*) NEGATIVE Final   Comment: CRITICAL RESULT CALLED TO, READ BACK BY AND VERIFIED WITH:     HOLLAND C RN 06/16/14 0838 COSTELLO B     Performed at [facility]            Studies: No results found.      Scheduled Meds: . antiseptic oral rinse  7 mL Mouth Rinse BID  . famotidine  20 mg Oral BID  . mirtazapine  7.5 mg Oral QHS  . nystatin  5 mL Oral QID  . saccharomyces boulardii  250 mg Oral BID  . vancomycin  125 mg Oral 4 times per day   Continuous Infusions: .  sodium bicarbonate 150 mEq in sterile water 1000 mL infusion 60 mL/hr at 06/17/14 1047    Principal Problem:   C. difficile colitis Active Problems:   Hypertension   Cirrhosis   Portal hypertension   Hepatoma   Dehydration   Recurrent right pleural effusion   Oral thrush   Protein-calorie malnutrition, severe   Abdominal pain   Colitis    Time spent: 35 minutes.    Vernell Leep, MD, FACP, FHM. [facility] Pager [phone]  If 7PM-7AM, please contact night-coverage [email or web address] Password [email or web address] 06/17/2014, 3:41 PM    LOS: 3 days

## 2014-06-18 LAB — BASIC METABOLIC PANEL
Anion gap: 16 — ABNORMAL HIGH (ref 5–15)
BUN: 65 mg/dL — ABNORMAL HIGH (ref 6–23)
CO2: 19 mEq/L (ref 19–32)
Calcium: 8.3 mg/dL — ABNORMAL LOW (ref 8.4–10.5)
Chloride: 103 mEq/L (ref 96–112)
Creatinine, Ser: 1.09 mg/dL (ref 0.50–1.10)
GFR calc Af Amer: 53 mL/min — ABNORMAL LOW (ref >90)
GFR calc non Af Amer: 46 mL/min — ABNORMAL LOW (ref >90)
Glucose, Bld: 93 mg/dL (ref 70–99)
Potassium: 3.5 mEq/L — ABNORMAL LOW (ref 3.7–5.3)
SODIUM: 138 meq/L (ref 137–147)

## 2014-06-18 LAB — GLUCOSE, CAPILLARY
Glucose-Capillary: 74 mg/dL (ref 70–99)
Glucose-Capillary: 123 mg/dL — ABNORMAL HIGH (ref 70–99)
Glucose-Capillary: 98 mg/dL (ref 70–99)
Glucose-Capillary: 104 mg/dL — ABNORMAL HIGH (ref 70–99)

## 2014-06-18 LAB — CBC
HCT: 41.6 % (ref 36.0–46.0)
Hemoglobin: 14.2 g/dL (ref 12.0–15.0)
MCH: 32.6 pg (ref 26.0–34.0)
MCHC: 34.1 g/dL (ref 30.0–36.0)
MCV: 95.4 fL (ref 78.0–100.0)
Platelets: 106 10*3/uL — ABNORMAL LOW (ref 150–400)
RBC: 4.36 MIL/uL (ref 3.87–5.11)
RDW: 17.2 % — ABNORMAL HIGH (ref 11.5–15.5)
WBC: 9.6 10*3/uL (ref 4.0–10.5)

## 2014-06-18 LAB — HEPATIC FUNCTION PANEL
ALK PHOS: 294 U/L — AB (ref 39–117)
ALT: 106 U/L — ABNORMAL HIGH (ref 0–35)
AST: 121 U/L — ABNORMAL HIGH (ref 0–37)
Albumin: 2.1 g/dL — ABNORMAL LOW (ref 3.5–5.2)
Bilirubin, Direct: 0.8 mg/dL — ABNORMAL HIGH (ref 0.0–0.3)
Indirect Bilirubin: 0.8 mg/dL (ref 0.3–0.9)
TOTAL PROTEIN: 5.8 g/dL — AB (ref 6.0–8.3)
Total Bilirubin: 1.6 mg/dL — ABNORMAL HIGH (ref 0.3–1.2)

## 2014-06-18 MED ORDER — SODIUM CHLORIDE 0.9 % IV SOLN: INTRAVENOUS | Status: DC

## 2014-06-18 MED ORDER — SODIUM CHLORIDE 0.9 % IV SOLN
INTRAVENOUS | Status: DC
  Administered 2014-06-18: 22:00:00 via INTRAVENOUS

## 2014-06-18 MED ORDER — LORAZEPAM 2 MG/ML IJ SOLN
0.2500 mg | INTRAMUSCULAR | Status: DC | PRN
  Administered 2014-06-18: 0.25 mg via INTRAVENOUS
  Filled 2014-06-18 (×2): qty 1

## 2014-06-18 NOTE — Progress Notes (Signed)
Clinical Social Work  Per MD, patient is not medically stable to DC today. CSW will continue to follow.  Sindy Messing, LCSW (Weekend Coverage)

## 2014-06-18 NOTE — Progress Notes (Addendum)
PROGRESS NOTE    [name]:[id number] DOB: [birth year] DOA: 06/14/2014 PCP: Alesia Richards, MD  HPI/Brief narrative 80 year old female with history of anemia, HTN, DM 2, recurrent diverticulitis, recent C. difficile colitis, cirrhosis with portal hypertension, right Pleurx catheter placed for pleural effusion related to hepatic disease, presented with abdominal pain, poor appetite and generalized weakness. Patient noted to have loose stools in the hospital. CT abdomen shows colonic wall thickening. C. difficile positive. Mental status and diarrhea improving.    Assessment/Plan:  1. Abdominal pain/C. difficile colitis: CT abdomen shows colonic wall thickening. Given prior history of C. difficile, ongoing loose stools and mild leukocytosis-there was high index of suspicion for recurrent C. difficile. C. difficile PCR positive. She had a negative PCR on 06/01/2014. DC empirically started IV Unasyn. Start oral vancomycin-first recurrence- Complete 14 days course UA-negative for UTI features. Avoid unnecessary antibiotics and consider DC PPIs. Continue probiotics. Patient having increased diarrhea overnight and today, rectal tube placed 2. Generalized weakness/failure to thrive: Secondary to multiple medical complications complicating advanced age. 3. Dehydration: Patient was given IV fluids, still has poor PO intake 4. Acute kidney injury: Likely prerenal. IV fluids and follow BMP. Creatinine improving. Now on bicarbonate drip, with increased diarrhea and min PO intake will continue IV fluids for now.  5. Right pleural effusion secondary to hepatic disease: Status post Pleurx-drained nightly. Small pleural effusion on chest x-ray. 6. Hypertension: Controlled off medications. 7. DM 2: Check CBGs. Controlled. 8. Cirrhosis with portal hypertension: Small volume ascites on CT. 9. Altered mental status/acute encephalopathy: Ammonia normal.? Baseline.? Secondary to acute illness complicating  possible underlying dementia. Treat above medical conditions and follow clinically. No focal deficits. Did cut back on pain medications and Remeron. Mental status definitely improving per exam and family report. 10. Thrombocytopenia: May be secondary to cirrhosis. No reported bleeding. Stable 11. Hypokalemia: Replete and follow BMP. 12. Abnormal LFTs:? Secondary to cirrhosis. Stable. Follow in a.m.   Code Status: DO NOT RESUSCITATE Family Communication: Discussed with son Laverna Peace at bedside. Disposition Plan: Return to SNF possibly in the next 24-48 hours.   Consultants:  None  Procedures:  None  Antibiotics:  IV Unasyn -discontinued 9/24  PO vancomycin 9/24 >  Subjective: Patient alert but confused. Denied abdominal pain. Having multiple episodes of liquid consistency BM's over night and this morning.   Objective: Filed Vitals:   06/17/14 0506 06/17/14 1400 06/17/14 2134 06/18/14 0405  BP: 125/61 120/52 117/48 111/58  Pulse: 100 102 100 101  Temp: 98.1 F (36.7 C) 98 F (36.7 C) 98.3 F (36.8 C) 97.6 F (36.4 C)  TempSrc: Oral Oral Oral Oral  Resp: 20 18 18 16   Height:      Weight: 74.027 kg (163 lb 3.2 oz)     SpO2: 97% 97% 97% 96%    Intake/Output Summary (Last 24 hours) at 06/18/14 1430 Last data filed at 06/18/14 0900  Gross per 24 hour  Intake    240 ml  Output   1223 ml  Net   -983 ml   Filed Weights   06/15/14 0143 06/17/14 0506  Weight: 74.707 kg (164 lb 11.2 oz) 74.027 kg (163 lb 3.2 oz)     Exam:  General exam: Elderly frail chronically ill-looking female sitting up comfortably in bed. Respiratory system: Poor inspiratory effort, diminished breath sounds in the bases but otherwise clear to auscultation. No increased work of breathing. Right Pleurx intact. Cardiovascular system: S1 & S2 heard, RRR. No JVD, murmurs, gallops, clicks  or pedal edema. Gastrointestinal system: Abdomen is non-distended, soft and nontender. Normal bowel sounds  heard. Central nervous system: Alert and oriented to self and partly to place. No focal neurological deficits. Extremities: Symmetric 5 x 5 power. Skin. Has a 4 cm x 2 cm stage 2 sacral decub   Data Reviewed: Basic Metabolic Panel:  Recent Labs Lab 06/15/14 0045 06/15/14 0419 06/16/14 0433 06/17/14 0537 06/18/14 0425  NA 136* 137 143 139 138  K 4.2 4.1 3.8 3.4* 3.5*  CL 103 103 110 106 103  CO2 16* 18* 17* 16* 19  GLUCOSE 135* 133* 120* 93 93  BUN 66* 68* 77* 72* 65*  CREATININE 1.26* 1.30* 1.26* 1.15* 1.09  CALCIUM 9.1 9.0 8.5 8.3* 8.3*   Liver Function Tests:  Recent Labs Lab 06/14/14 1725 06/15/14 0045 06/15/14 0419 06/16/14 0433 06/18/14 0425  AST 103* 78* 74* 76* 121*  ALT 92* 77* 72* 74* 106*  ALKPHOS 5* 333* 269* 229* 294*  BILITOT <0.2* 1.8* 1.6* 1.3* 1.6*  PROT 7.4 6.4 6.1 5.8* 5.8*  ALBUMIN 2.7* 2.3* 2.2* 2.0* 2.1*   No results found for this basename: LIPASE, AMYLASE,  in the last 168 hours  Recent Labs Lab 06/14/14 1727  AMMONIA 55   CBC:  Recent Labs Lab 06/14/14 1725 06/15/14 0419 06/16/14 0433 06/17/14 0537 06/18/14 0425  WBC 13.5* 11.0* 7.7 8.0 9.6  NEUTROABS 12.2*  --   --   --   --   HGB 16.8* 14.9 14.8 14.2 14.2  HCT 48.6* 44.7 43.4 41.1 41.6  MCV 98.6 100.0 99.1 96.5 95.4  PLT 126* PLATELET CLUMPS NOTED ON SMEAR, COUNT APPEARS ADEQUATE 101* 103* 106*   Cardiac Enzymes: No results found for this basename: CKTOTAL, CKMB, CKMBINDEX, TROPONINI,  in the last 168 hours BNP (last 3 results)  Recent Labs  05/08/14 0525  PROBNP 71.3   CBG:  Recent Labs Lab 06/17/14 1227 06/17/14 1706 06/17/14 2117 06/18/14 0732 06/18/14 1157  GLUCAP 109* 92 99 74 123*    Recent Results (from the past 240 hour(s))  CLOSTRIDIUM DIFFICILE BY PCR     Status: Abnormal   Collection Time    06/15/14  3:05 PM      Result Value Ref Range Status   C difficile by pcr POSITIVE (*) NEGATIVE Final   Comment: CRITICAL RESULT CALLED TO, READ BACK  BY AND VERIFIED WITH:     HOLLAND C RN 06/16/14 0838 COSTELLO B     Performed at [facility]           Studies: No results found.      Scheduled Meds: . antiseptic oral rinse  7 mL Mouth Rinse BID  . famotidine  20 mg Oral BID  . mirtazapine  7.5 mg Oral QHS  . nystatin  5 mL Oral QID  . saccharomyces boulardii  250 mg Oral BID  . vancomycin  125 mg Oral 4 times per day   Continuous Infusions:    Principal Problem:   C. difficile colitis Active Problems:   Hypertension   Cirrhosis   Portal hypertension   Hepatoma   Dehydration   Recurrent right pleural effusion   Oral thrush   Protein-calorie malnutrition, severe   Abdominal pain   Colitis    Time spent: 30 minutes.    Kelvin Cellar, MD, FACP, FHM. [facility] Pager [phone]  If 7PM-7AM, please contact night-coverage [email or web address] Password [email or web address] 06/18/2014, 2:30 PM    LOS: 4 days

## 2014-06-19 LAB — GLUCOSE, CAPILLARY
GLUCOSE-CAPILLARY: 76 mg/dL (ref 70–99)
GLUCOSE-CAPILLARY: 107 mg/dL — AB (ref 70–99)
GLUCOSE-CAPILLARY: 116 mg/dL — AB (ref 70–99)
GLUCOSE-CAPILLARY: 124 mg/dL — AB (ref 70–99)

## 2014-06-19 LAB — BASIC METABOLIC PANEL
ANION GAP: 14 (ref 5–15)
BUN: 55 mg/dL — AB (ref 6–23)
CHLORIDE: 101 meq/L (ref 96–112)
CO2: 22 mEq/L (ref 19–32)
Calcium: 8.2 mg/dL — ABNORMAL LOW (ref 8.4–10.5)
Creatinine, Ser: 1.12 mg/dL — ABNORMAL HIGH (ref 0.50–1.10)
GFR calc Af Amer: 51 mL/min — ABNORMAL LOW (ref >90)
GFR calc non Af Amer: 44 mL/min — ABNORMAL LOW (ref >90)
Glucose, Bld: 91 mg/dL (ref 70–99)
Potassium: 3.2 mEq/L — ABNORMAL LOW (ref 3.7–5.3)
SODIUM: 137 meq/L (ref 137–147)

## 2014-06-19 LAB — CBC
HEMATOCRIT: 42.7 % (ref 36.0–46.0)
Hemoglobin: 14.6 g/dL (ref 12.0–15.0)
MCH: 32.7 pg (ref 26.0–34.0)
MCHC: 34.2 g/dL (ref 30.0–36.0)
MCV: 95.5 fL (ref 78.0–100.0)
Platelets: 109 10*3/uL — ABNORMAL LOW (ref 150–400)
RBC: 4.47 MIL/uL (ref 3.87–5.11)
RDW: 17.4 % — AB (ref 11.5–15.5)
WBC: 11.7 10*3/uL — AB (ref 4.0–10.5)

## 2014-06-19 MED ORDER — VANCOMYCIN 50 MG/ML ORAL SOLUTION
250.0000 mg | Freq: Four times a day (QID) | ORAL | Status: DC
  Administered 2014-06-19 – 2014-06-22 (×13): 250 mg via ORAL
  Filled 2014-06-19 (×17): qty 5

## 2014-06-19 MED ORDER — POTASSIUM CHLORIDE CRYS ER 20 MEQ PO TBCR
40.0000 meq | EXTENDED_RELEASE_TABLET | Freq: Four times a day (QID) | ORAL | Status: AC
  Administered 2014-06-19 (×2): 40 meq via ORAL
  Filled 2014-06-19 (×2): qty 2

## 2014-06-19 MED ORDER — SODIUM CHLORIDE 0.9 % IV SOLN
INTRAVENOUS | Status: DC
  Administered 2014-06-20 – 2014-06-21 (×3): via INTRAVENOUS

## 2014-06-19 NOTE — Progress Notes (Signed)
PROGRESS NOTE    [name] CWC:[id number] DOB: [birth year] DOA: 06/14/2014 PCP: Alesia Richards, MD  HPI/Brief narrative 80 year old female with history of anemia, HTN, DM 2, recurrent diverticulitis, recent C. difficile colitis, cirrhosis with portal hypertension, right Pleurx catheter placed for pleural effusion related to hepatic disease, presented with abdominal pain, poor appetite and generalized weakness. Patient noted to have loose stools in the hospital. CT abdomen shows colonic wall thickening. C. difficile positive. Mental status and diarrhea improving.    Assessment/Plan:  1. Abdominal pain/C. difficile colitis: CT abdomen shows colonic wall thickening. Given prior history of C. difficile, ongoing loose stools and mild leukocytosis-there was high index of suspicion for recurrent C. difficile. C. difficile PCR positive. She had a negative PCR on 06/01/2014. DC empirically started IV Unasyn. Start oral vancomycin-first recurrence- Complete 14 days course UA-negative for UTI features. Avoid unnecessary antibiotics and consider DC PPIs. Continue probiotics. Patient having increased diarrhea overnight and today       On 06/19/2014 she seems improved, family reporting eating a littler more.  2. Generalized weakness/failure to thrive: Secondary to multiple medical complications complicating advanced age. 3. Dehydration: Patient was given IV fluids, still has poor PO intake 4. Acute kidney injury: Likely prerenal. IV fluids and follow BMP. Her BUN slowly trends down, since she continues to have diarrhea will continue running NS for the next 24 hours.  5. Right pleural effusion secondary to hepatic disease: Status post Pleurx-drained nightly. Small pleural effusion on chest x-ray. 6. Hypertension: Controlled off medications. 7. DM 2: Check CBGs. Controlled. 8. Cirrhosis with portal hypertension: Small volume ascites on CT. 9. Altered mental status/acute encephalopathy: Ammonia  normal.? Baseline.? Secondary to acute illness complicating possible underlying dementia. Treat above medical conditions and follow clinically. No focal deficits. Did cut back on pain medications and Remeron. Mental status definitely improving per exam and family report. 10. Thrombocytopenia: May be secondary to cirrhosis. No reported bleeding. Stable 11. Hypokalemia: replacing today, likely secondary to diarrhea.  12. Abnormal LFTs:? Secondary to cirrhosis. Stable. Follow in a.m.   Code Status: DO NOT RESUSCITATE Family Communication: Discussed with son Laverna Peace at bedside. Disposition Plan: Return to SNF possibly in the next 24-48 hours.   Consultants:  None  Procedures:  None  Antibiotics:  IV Unasyn -discontinued 9/24  PO vancomycin 9/24 >  Subjective: Patient alert, remains confused. Son present at bedside. Overall seems a little better this morning.   Objective: Filed Vitals:   06/18/14 0405 06/18/14 1440 06/18/14 2028 06/19/14 0503  BP: 111/58 121/60 127/67 123/64  Pulse: 101 100 104 106  Temp: 97.6 F (36.4 C) 98.3 F (36.8 C) 98 F (36.7 C) 98.3 F (36.8 C)  TempSrc: Oral Oral Oral Oral  Resp: 16 18 16 18   Height:      Weight:    73.936 kg (163 lb)  SpO2: 96% 95% 98% 98%    Intake/Output Summary (Last 24 hours) at 06/19/14 1207 Last data filed at 06/19/14 0900  Gross per 24 hour  Intake 504.17 ml  Output    525 ml  Net -20.83 ml   Filed Weights   06/15/14 0143 06/17/14 0506 06/19/14 0503  Weight: 74.707 kg (164 lb 11.2 oz) 74.027 kg (163 lb 3.2 oz) 73.936 kg (163 lb)     Exam:  General exam: Elderly frail chronically ill-looking female sitting up comfortably in bed. Respiratory system: Poor inspiratory effort, diminished breath sounds in the bases but otherwise clear to auscultation. No increased work of breathing.  Right Pleurx intact. Cardiovascular system: S1 & S2 heard, RRR. No JVD, murmurs, gallops, clicks or pedal edema. Gastrointestinal  system: Abdomen is non-distended, soft and nontender. Normal bowel sounds heard. Central nervous system: Alert and oriented to self and partly to place. No focal neurological deficits. Extremities: Symmetric 5 x 5 power. Skin. Has a 4 cm x 2 cm stage 2 sacral decub   Data Reviewed: Basic Metabolic Panel:  Recent Labs Lab 06/15/14 0419 06/16/14 0433 06/17/14 0537 06/18/14 0425 06/19/14 0532  NA 137 143 139 138 137  K 4.1 3.8 3.4* 3.5* 3.2*  CL 103 110 106 103 101  CO2 18* 17* 16* 19 22  GLUCOSE 133* 120* 93 93 91  BUN 68* 77* 72* 65* 55*  CREATININE 1.30* 1.26* 1.15* 1.09 1.12*  CALCIUM 9.0 8.5 8.3* 8.3* 8.2*   Liver Function Tests:  Recent Labs Lab 06/14/14 1725 06/15/14 0045 06/15/14 0419 06/16/14 0433 06/18/14 0425  AST 103* 78* 74* 76* 121*  ALT 92* 77* 72* 74* 106*  ALKPHOS 5* 333* 269* 229* 294*  BILITOT <0.2* 1.8* 1.6* 1.3* 1.6*  PROT 7.4 6.4 6.1 5.8* 5.8*  ALBUMIN 2.7* 2.3* 2.2* 2.0* 2.1*   No results found for this basename: LIPASE, AMYLASE,  in the last 168 hours  Recent Labs Lab 06/14/14 1727  AMMONIA 55   CBC:  Recent Labs Lab 06/14/14 1725 06/15/14 0419 06/16/14 0433 06/17/14 0537 06/18/14 0425 06/19/14 0532  WBC 13.5* 11.0* 7.7 8.0 9.6 11.7*  NEUTROABS 12.2*  --   --   --   --   --   HGB 16.8* 14.9 14.8 14.2 14.2 14.6  HCT 48.6* 44.7 43.4 41.1 41.6 42.7  MCV 98.6 100.0 99.1 96.5 95.4 95.5  PLT 126* PLATELET CLUMPS NOTED ON SMEAR, COUNT APPEARS ADEQUATE 101* 103* 106* 109*   Cardiac Enzymes: No results found for this basename: CKTOTAL, CKMB, CKMBINDEX, TROPONINI,  in the last 168 hours BNP (last 3 results)  Recent Labs  05/08/14 0525  PROBNP 71.3   CBG:  Recent Labs Lab 06/18/14 0732 06/18/14 1157 06/18/14 1725 06/18/14 2128 06/19/14 0754  GLUCAP 74 123* 98 104* 76    Recent Results (from the past 240 hour(s))  CLOSTRIDIUM DIFFICILE BY PCR     Status: Abnormal   Collection Time    06/15/14  3:05 PM      Result Value  Ref Range Status   C difficile by pcr POSITIVE (*) NEGATIVE Final   Comment: CRITICAL RESULT CALLED TO, READ BACK BY AND VERIFIED WITH:     HOLLAND C RN 06/16/14 0838 COSTELLO B     Performed at [facility]           Studies: No results found.      Scheduled Meds: . antiseptic oral rinse  7 mL Mouth Rinse BID  . famotidine  20 mg Oral BID  . mirtazapine  7.5 mg Oral QHS  . nystatin  5 mL Oral QID  . potassium chloride  40 mEq Oral Q6H  . saccharomyces boulardii  250 mg Oral BID  . vancomycin  250 mg Oral 4 times per day   Continuous Infusions: . sodium chloride      Principal Problem:   C. difficile colitis Active Problems:   Hypertension   Cirrhosis   Portal hypertension   Hepatoma   Dehydration   Recurrent right pleural effusion   Oral thrush   Protein-calorie malnutrition, severe   Abdominal pain   Colitis  Time spent: 25 minutes.    Kelvin Cellar, MD, FACP, FHM. [facility] Pager [phone]  If 7PM-7AM, please contact night-coverage [email or web address] Password [email or web address] 06/19/2014, 12:07 PM    LOS: 5 days

## 2014-06-19 NOTE — Progress Notes (Signed)
Patient discussed with Dr. Coralyn Pear. Stated that patient is not medically stable for d/c and continues to have diarrhea.  CSW spoke with [name]- who stated that if patient required a pleurex cath at d/c- they could manage it.  CSW services will continue to monitor for possible date of stability.  [name]. [name], [name] (weekend coverge)

## 2014-06-19 NOTE — Plan of Care (Signed)
Problem: Phase I Progression Outcomes Goal: Voiding-avoid urinary catheter unless indicated Outcome: Not Progressing Catheter PTA

## 2014-06-20 DIAGNOSIS — R404 Transient alteration of awareness: Secondary | ICD-10-CM

## 2014-06-20 LAB — BASIC METABOLIC PANEL
ANION GAP: 13 (ref 5–15)
BUN: 45 mg/dL — AB (ref 6–23)
CHLORIDE: 106 meq/L (ref 96–112)
CO2: 21 mEq/L (ref 19–32)
CREATININE: 1.07 mg/dL (ref 0.50–1.10)
Calcium: 7.7 mg/dL — ABNORMAL LOW (ref 8.4–10.5)
GFR calc Af Amer: 54 mL/min — ABNORMAL LOW (ref >90)
GFR, EST NON AFRICAN AMERICAN: 47 mL/min — AB (ref >90)
Glucose, Bld: 101 mg/dL — ABNORMAL HIGH (ref 70–99)
Potassium: 4 mEq/L (ref 3.7–5.3)
Sodium: 140 mEq/L (ref 137–147)

## 2014-06-20 LAB — GLUCOSE, CAPILLARY
GLUCOSE-CAPILLARY: 89 mg/dL (ref 70–99)
Glucose-Capillary: 99 mg/dL (ref 70–99)
Glucose-Capillary: 124 mg/dL — ABNORMAL HIGH (ref 70–99)
Glucose-Capillary: 119 mg/dL — ABNORMAL HIGH (ref 70–99)

## 2014-06-20 MED ORDER — QUETIAPINE 12.5 MG HALF TABLET
12.5000 mg | ORAL_TABLET | Freq: Two times a day (BID) | ORAL | Status: DC | PRN
  Filled 2014-06-20: qty 1

## 2014-06-20 MED ORDER — ENSURE COMPLETE PO LIQD
237.0000 mL | Freq: Three times a day (TID) | ORAL | Status: DC
  Administered 2014-06-20 – 2014-06-22 (×6): 237 mL via ORAL

## 2014-06-20 NOTE — Progress Notes (Signed)
Physical Therapy Treatment Patient Details Name: [name] MRN: [id number] DOB: 1927/12/26 Today's Date: 06/20/2014    History of Present Illness 80 year old female with history of anemia, HTN, DM 2, recurrent diverticulitis, cirrhosis with portal hypertension, right Pleurx catheter placed for pleural effusion related to hepatic disease, admitted with abdominal pain, c. diff and generalized weakness.    PT Comments    Pt con to have loose stools.  Assisted OOB + 2 to [facility].  Pt was not able to stand upright and great difficulty completing 1/4 turn.  Assisted off BSC back to bed with much effort.  Very weak and deconditioned.  Positioned with pillows on L side.  Follow Up Recommendations  SNF     Equipment Recommendations       Recommendations for Other Services       Precautions / Restrictions Precautions Precautions: Fall Restrictions Weight Bearing Restrictions: No    Mobility  Bed Mobility Overal bed mobility: +2 for physical assistance;Needs Assistance Bed Mobility: Supine to Sit;Sit to Supine     Supine to sit: Max assist;+2 for physical assistance Sit to supine: Max assist;+2 for physical assistance   General bed mobility comments: pt inititated movement however required assist to complete bed mobility, utilized bed pad for scoot hips.  Pt sat EOB 2 min at mod assist before c/o fatigue  Transfers Overall transfer level: Needs assistance Equipment used: Rolling walker (2 wheeled) Transfers: Sit to/from Stand Sit to Stand: +2 physical assistance;+2 safety/equipment;Total assist         General transfer comment: assited 1/4 turn from elevated bed to [facility] then again from [facility] to bed.  Total assist + 2 pt 15% with incomplete turns and partial upright posture.  Very weak.  Ambulation/Gait         Gait velocity: unable to attempt due to low perfomance with transfers       Stairs            Wheelchair Mobility    Modified Rankin (Stroke Patients  Only)       Balance                                    Cognition                            Exercises      General Comments        Pertinent Vitals/Pain      Home Living                      Prior Function            PT Goals (current goals can now be found in the care plan section) Progress towards PT goals: Progressing toward goals    Frequency       PT Plan      Co-evaluation             End of Session Equipment Utilized During Treatment: Gait belt Activity Tolerance: Patient limited by fatigue;Patient limited by pain       Time: 1400-1425 PT Time Calculation (min): 25 min  Charges:  $Therapeutic Activity: 23-37 mins                    G Codes:      Rica Koyanagi  PTA [facility]  Acute  Rehab Pager  [phone]  

## 2014-06-20 NOTE — Progress Notes (Addendum)
IV due to be changed,site still looks good. One attempt was made unsuccessfully. IV team called to take a look. PO intake is poor,along with output. Encouraging pt to increase PO intake. She likes ensure. Daughters at bedside today.Pt states she feels SOB this am. VS stable, O2 sat 96%. Pt given .25 mg Ativan. Dr. Coralyn Pear called.Daughter states pt has been acting agitated at times and hallucinating or having strange dreams and she wanted to ask the MD about that. Dr. Coralyn Pear informed and he made some medication changes. Pt appears comfortable denies any pain. IV nurse unable to start new IV, will keep present IV, tubing changed.

## 2014-06-20 NOTE — Progress Notes (Signed)
PROGRESS NOTE    [name] YQM:[id number] DOB: 01-12-1928 DOA: 06/14/2014 PCP: Alesia Richards, MD  HPI/Brief narrative 80 year old female with history of anemia, HTN, DM 2, recurrent diverticulitis, recent C. difficile colitis, cirrhosis with portal hypertension, right Pleurx catheter placed for pleural effusion related to hepatic disease, presented with abdominal pain, poor appetite and generalized weakness. Patient noted to have loose stools in the hospital. CT abdomen shows colonic wall thickening. C. difficile positive. Mental status and diarrhea improving.    Assessment/Plan:  1. Abdominal pain/C. difficile colitis: CT abdomen shows colonic wall thickening. Given prior history of C. difficile, ongoing loose stools and mild leukocytosis-there was high index of suspicion for recurrent C. difficile. C. difficile PCR positive. She had a negative PCR on 06/01/2014. DC empirically started IV Unasyn. Start oral vancomycin-first recurrence- Complete 14 days course UA-negative for UTI features. Avoid unnecessary antibiotics and consider DC PPIs. Continue probiotics.        Given increase in diarrhea over the weekend, her oral vancomycin dose was increased to 250 mg PO QID. She continues to       have diarrhea although there has been some improvement.  2. Generalized weakness/failure to thrive: Secondary to multiple medical complications complicating advanced age, plan to discharge to SNF when medically stable 3. Dehydration: Patient was given IV fluids, still has poor PO intake 4. Acute kidney injury: Likely prerenal. IV fluids and follow BMP. Her BUN slowly trends down, since she continues to have diarrhea will continue running NS for the next 24 hours. BUN trending down to 45 from 72 on 06/17/2014.  5. Right pleural effusion secondary to hepatic disease: Status post Pleurx-drained nightly. Small pleural effusion on chest x-ray. 6. Hypertension: Controlled off medications. 7. DM 2: Check  CBGs. Controlled. 8. Cirrhosis with portal hypertension: Small volume ascites on CT. 9. Altered mental status/acute encephalopathy: Ammonia normal.? Baseline.? Secondary to acute illness complicating possible underlying dementia. Treat above medical conditions and follow clinically. Likely having delirium, will stop IV ativan and Remeron start as needed seroquel 12.5 PO BID for acute agitation.  10. Thrombocytopenia: May be secondary to cirrhosis. No reported bleeding. Stable 11. Hypokalemia: Potassium normalized, likely secondary to diarrhea.  12. Abnormal LFTs: Stable   Code Status: DO NOT RESUSCITATE Family Communication: Discussed with son and daughter Disposition Plan: Return to SNF possibly in the next 24-48 hours.   Consultants:  None  Procedures:  None  Antibiotics:  IV Unasyn -discontinued 9/24  PO vancomycin 9/24 >  Subjective: Patient alert, remains confused. Son present at bedside. Overall seems a little better this morning.   Objective: Filed Vitals:   06/19/14 1400 06/19/14 2057 06/20/14 0533 06/20/14 1400  BP: 120/63 136/61 112/58 113/68  Pulse: 100 90 93 93  Temp: 98.2 F (36.8 C) 98.5 F (36.9 C) 97.8 F (36.6 C) 97.8 F (36.6 C)  TempSrc: Oral Oral Oral Oral  Resp: 16 15 18 14   Height:      Weight:   81.2 kg (179 lb 0.2 oz)   SpO2: 98% 96% 99% 100%    Intake/Output Summary (Last 24 hours) at 06/20/14 1444 Last data filed at 06/20/14 1400  Gross per 24 hour  Intake 1501.25 ml  Output    425 ml  Net 1076.25 ml   Filed Weights   06/17/14 0506 06/19/14 0503 06/20/14 0533  Weight: 74.027 kg (163 lb 3.2 oz) 73.936 kg (163 lb) 81.2 kg (179 lb 0.2 oz)     Exam:  General exam: Elderly  frail chronically ill-looking female sitting up comfortably in bed. Respiratory system: Poor inspiratory effort, diminished breath sounds in the bases but otherwise clear to auscultation. No increased work of breathing. Right Pleurx intact. Cardiovascular system:  S1 & S2 heard, RRR. No JVD, murmurs, gallops, clicks or pedal edema. Gastrointestinal system: Abdomen is non-distended, soft and nontender. Normal bowel sounds heard. Central nervous system: Alert and oriented to self and partly to place. No focal neurological deficits. Extremities: Symmetric 5 x 5 power. Skin. Has a 4 cm x 2 cm stage 2 sacral decub   Data Reviewed: Basic Metabolic Panel:  Recent Labs Lab 06/16/14 0433 06/17/14 0537 06/18/14 0425 06/19/14 0532 06/20/14 0748  NA 143 139 138 137 140  K 3.8 3.4* 3.5* 3.2* 4.0  CL 110 106 103 101 106  CO2 17* 16* 19 22 21   GLUCOSE 120* 93 93 91 101*  BUN 77* 72* 65* 55* 45*  CREATININE 1.26* 1.15* 1.09 1.12* 1.07  CALCIUM 8.5 8.3* 8.3* 8.2* 7.7*   Liver Function Tests:  Recent Labs Lab 06/14/14 1725 06/15/14 0045 06/15/14 0419 06/16/14 0433 06/18/14 0425  AST 103* 78* 74* 76* 121*  ALT 92* 77* 72* 74* 106*  ALKPHOS 5* 333* 269* 229* 294*  BILITOT <0.2* 1.8* 1.6* 1.3* 1.6*  PROT 7.4 6.4 6.1 5.8* 5.8*  ALBUMIN 2.7* 2.3* 2.2* 2.0* 2.1*   No results found for this basename: LIPASE, AMYLASE,  in the last 168 hours  Recent Labs Lab 06/14/14 1727  AMMONIA 55   CBC:  Recent Labs Lab 06/14/14 1725 06/15/14 0419 06/16/14 0433 06/17/14 0537 06/18/14 0425 06/19/14 0532  WBC 13.5* 11.0* 7.7 8.0 9.6 11.7*  NEUTROABS 12.2*  --   --   --   --   --   HGB 16.8* 14.9 14.8 14.2 14.2 14.6  HCT 48.6* 44.7 43.4 41.1 41.6 42.7  MCV 98.6 100.0 99.1 96.5 95.4 95.5  PLT 126* PLATELET CLUMPS NOTED ON SMEAR, COUNT APPEARS ADEQUATE 101* 103* 106* 109*   Cardiac Enzymes: No results found for this basename: CKTOTAL, CKMB, CKMBINDEX, TROPONINI,  in the last 168 hours BNP (last 3 results)  Recent Labs  05/08/14 0525  PROBNP 71.3   CBG:  Recent Labs Lab 06/19/14 1152 06/19/14 1715 06/19/14 2146 06/20/14 0805 06/20/14 1235  GLUCAP 107* 116* 124* 99 124*    Recent Results (from the past 240 hour(s))  CLOSTRIDIUM  DIFFICILE BY PCR     Status: Abnormal   Collection Time    06/15/14  3:05 PM      Result Value Ref Range Status   C difficile by pcr POSITIVE (*) NEGATIVE Final   Comment: CRITICAL RESULT CALLED TO, READ BACK BY AND VERIFIED WITH:     HOLLAND C RN 06/16/14 0838 COSTELLO B     Performed at [facility]           Studies: No results found.      Scheduled Meds: . antiseptic oral rinse  7 mL Mouth Rinse BID  . famotidine  20 mg Oral BID  . feeding supplement (ENSURE COMPLETE)  237 mL Oral TID BM  . nystatin  5 mL Oral QID  . saccharomyces boulardii  250 mg Oral BID  . vancomycin  250 mg Oral 4 times per day   Continuous Infusions: . sodium chloride 75 mL/hr at 06/20/14 9622    Principal Problem:   C. difficile colitis Active Problems:   Hypertension   Cirrhosis   Portal hypertension  Hepatoma   Dehydration   Recurrent right pleural effusion   Oral thrush   Protein-calorie malnutrition, severe   Abdominal pain   Colitis    Time spent: 25 minutes.    Kelvin Cellar, MD, FACP, FHM. [facility] Pager [phone]  If 7PM-7AM, please contact night-coverage [email or web address] Password [email or web address] 06/20/2014, 2:44 PM    LOS: 6 days

## 2014-06-20 NOTE — Progress Notes (Signed)
NUTRITION FOLLOW UP  Intervention:   -Recommend Ensure Complete BID, each supplement provides 350 kcal, and 13 gram protein -Modify to Soft Diet/Vegetarian -RD to continue to monitor  Nutrition Dx:   Inadequate oral intake related to abd distension/weakness as evidenced by decreased PO intake, refusing meals; progressing   Goal:   Pt to meet >/= 90% of their estimated nutrition needs; progressing   Monitor:   Total protein/kcal intake, GI profile, labs, weights  Assessment:   9/23: -Pt's granddaughter reported pt has had improved appetite at SNF. Has been eating balanced meals regularly with Ensure supplementation. Pt tolerates soft foods, with some foods chopped. Pt is strict vegetarian, does not eat eggs, fish, chicken or red meat. Will honor pt's diet as tolerated  -Appetite has decreased two days ago d/t weakness, abd distension, and has started to refuse meals  -Has hx of severe malnutrition during previous admit in 04/2014; required TPN and was on Megace 400 mg BID. TPN as d/c'd, supplements and snacks were encouraged upon discharge  -Endorsed weight loss, family unable to quantify amount; however granddaughter noted severe muscle wasting and subcutaneous fat loss particularly around temporal, clavicle, occipital regions.  -Previous medical records indicate pt lost 12 lbs in past three months (7.3% body weight loss, significant for time frame)  -Pt on clear liquid d/t possible colitis per MD. Will continue to monitor and modify diet and add supplements as tolerated  9/28: -Diet advanced to soft on 9/25, PO intake 5% -Pt reported eating more than she was pta; however pt's breakfast tray was untouched during time of RD follow up.  -RD offered alternative breakfast to encourage PO intake; however, pt appeared confused as she noted that she forgot tray was there -Offered nutrition supplements, pt in agreement for chocolate Ensure as she tries to drink two Ensure daily at SNF -Pt  reported being a vegetarian, but does allow herself eggs and fish. -Continues with loose stools, which is also likely inhibiting appetite  Height: Ht Readings from Last 1 Encounters:  06/15/14 5\' 5"  (1.651 m)    Weight Status:   Wt Readings from Last 1 Encounters:  06/20/14 179 lb 0.2 oz (81.2 kg)  06/15/14 164 lb   Re-estimated needs:  Kcal: 1800-2000  Protein: 85-95 gram  Fluid: >/=1800 ml/daily   Skin: WDL  Diet Order: Criss Rosales   Intake/Output Summary (Last 24 hours) at 06/20/14 1218 Last data filed at 06/20/14 0600  Gross per 24 hour  Intake 901.25 ml  Output    225 ml  Net 676.25 ml    Last BM: 9/27   Labs:   Recent Labs Lab 06/18/14 0425 06/19/14 0532 06/20/14 0748  NA 138 137 140  K 3.5* 3.2* 4.0  CL 103 101 106  CO2 19 22 21   BUN 65* 55* 45*  CREATININE 1.09 1.12* 1.07  CALCIUM 8.3* 8.2* 7.7*  GLUCOSE 93 91 101*    CBG (last 3)   Recent Labs  06/19/14 1715 06/19/14 2146 06/20/14 0805  GLUCAP 116* 124* 99    Scheduled Meds: . antiseptic oral rinse  7 mL Mouth Rinse BID  . famotidine  20 mg Oral BID  . feeding supplement (ENSURE COMPLETE)  237 mL Oral TID BM  . mirtazapine  7.5 mg Oral QHS  . nystatin  5 mL Oral QID  . saccharomyces boulardii  250 mg Oral BID  . vancomycin  250 mg Oral 4 times per day    Continuous Infusions: . sodium chloride 75 mL/hr  at 06/20/14 Hanna Clinical Dietitian [phone]

## 2014-06-20 NOTE — Progress Notes (Signed)
CSW continuing to follow for disposition needs.  Pt from [facility] and plan is for pt to return when medically ready. Per MD, pt not yet medically ready for discharge.  CSW received notification from pt RN that pt daughter present at bedside and requesting to speak with CSW.   CSW visited pt room and pt daughter stated that she wished to meet along with CSW.  CSW provided emotional counseling to pt daughter as she expressed her concerns surrounding her mother's sickness and hospitalization. Pt daughter expressed fear surrounding pt returning to [facility] given that MD see's pt once per week. CSW validated pt daughters feelings and discussed that pt discharge information will be very thorough regarding pt needs. CSW discussed with pt daughter that [facility] also has a Academic librarian that can come visit pt during hospitalization in order to communicate to the facility about pt needs. Pt daughter states that she feels that this will be helpful and may relieve some of her concerns once pt returns to [facility]  CSW notified [facility] liaison, [place] who plans to visit pt and communicate with pt daughter.   Pt daughter appreciative of CSW support and assistance throughout hospitalization.  CSW to continue to follow to provide support and assist with pt transition back to [facility] when pt medically stable for discharge.  Alison Murray, MSW, [facility] [phone]

## 2014-06-21 LAB — GLUCOSE, CAPILLARY
GLUCOSE-CAPILLARY: 135 mg/dL — AB (ref 70–99)
Glucose-Capillary: 81 mg/dL (ref 70–99)
Glucose-Capillary: 73 mg/dL (ref 70–99)
Glucose-Capillary: 138 mg/dL — ABNORMAL HIGH (ref 70–99)

## 2014-06-21 NOTE — Progress Notes (Signed)
PROGRESS NOTE    [name] KVQ:[id number] DOB: May 19, 1928 DOA: 06/14/2014 PCP: Alesia Richards, MD  HPI/Brief narrative 80 year old female with history of anemia, HTN, DM 2, recurrent diverticulitis, recent C. difficile colitis, cirrhosis with portal hypertension, right Pleurx catheter placed for pleural effusion related to hepatic disease, presented with abdominal pain, poor appetite and generalized weakness. Patient noted to have loose stools in the hospital. CT abdomen shows colonic wall thickening. C. difficile positive. Mental status and diarrhea improving.    Assessment/Plan:  1. Abdominal pain/C. difficile colitis: CT abdomen shows colonic wall thickening. Given prior history of C. difficile, ongoing loose stools and mild leukocytosis-there was high index of suspicion for recurrent C. difficile. C. difficile PCR positive. She had a negative PCR on 06/01/2014. DC empirically started IV Unasyn. Start oral vancomycin-first recurrence- Complete 14 days course UA-negative for UTI features. Avoid unnecessary antibiotics and consider DC PPIs. Continue probiotics.        Given increase in diarrhea over the weekend, her oral vancomycin dose was increased to 250 mg PO QID.       She continues to have diarrhea although there  has been some improvement.        On 06/21/2014 patient showing gradual improvement, still has little appetite. Had 2 watery bowel movements this morning. Anticipate discharge in the next 24 hours if remains stable 2. Generalized weakness/failure to thrive: Secondary to multiple medical complications complicating advanced age, plan to discharge to SNF when medically stable 3. Dehydration: Patient was given IV fluids, still has poor PO intake 4. Acute kidney injury: Likely prerenal. IV fluids and follow BMP. Her BUN slowly trends down, since she continues to have diarrhea will continue running NS for the next 24 hours. BUN trending down to 45 from 72 on 06/17/2014.   5. Right pleural effusion secondary to hepatic disease: Status post Pleurx-drained nightly. Small pleural effusion on chest x-ray. 6. Hypertension: Controlled off medications. 7. DM 2: Check CBGs. Controlled. 8. Cirrhosis with portal hypertension: Small volume ascites on CT. 9. Altered mental status/acute encephalopathy: Ammonia normal.? Baseline.? Secondary to acute illness complicating possible underlying dementia. Treat above medical conditions and follow clinically. Likely having delirium, stopped IV ativan and Remeron on 06/21/2014 and started as needed seroquel 12.5 PO BID for acute agitation.  10. Thrombocytopenia: May be secondary to cirrhosis. No reported bleeding. Stable 11. Hypokalemia: Potassium normalized, likely secondary to diarrhea.  12. Abnormal LFTs: Stable   Code Status: DO NOT RESUSCITATE Family Communication: Discussed with son and daughter Disposition Plan: Return to SNF possibly in the next 24   Consultants:  None  Procedures:  None  Antibiotics:  IV Unasyn -discontinued 9/24  PO vancomycin 9/24 >  Subjective: Patient alert, seems less agitated, RN informed me that she had 2 watery bowel movements this morning.   Objective: Filed Vitals:   06/20/14 0533 06/20/14 1400 06/20/14 2145 06/21/14 0506  BP: 112/58 113/68 139/73 124/68  Pulse: 93 93 102 106  Temp: 97.8 F (36.6 C) 97.8 F (36.6 C) 97.6 F (36.4 C) 97.9 F (36.6 C)  TempSrc: Oral Oral Oral Oral  Resp: 18 14 16 24   Height:      Weight: 81.2 kg (179 lb 0.2 oz)   77.7 kg (171 lb 4.8 oz)  SpO2: 99% 100% 96% 95%    Intake/Output Summary (Last 24 hours) at 06/21/14 1213 Last data filed at 06/21/14 0850  Gross per 24 hour  Intake 2265.6 ml  Output   2375 ml  Net -  109.4 ml   Filed Weights   06/19/14 0503 06/20/14 0533 06/21/14 0506  Weight: 73.936 kg (163 lb) 81.2 kg (179 lb 0.2 oz) 77.7 kg (171 lb 4.8 oz)     Exam:  General exam: Elderly frail chronically ill-looking female  sitting up comfortably in bed. Respiratory system: Poor inspiratory effort, diminished breath sounds in the bases but otherwise clear to auscultation. No increased work of breathing. Right Pleurx intact. Cardiovascular system: S1 & S2 heard, RRR. No JVD, murmurs, gallops, clicks or pedal edema. Gastrointestinal system: Abdomen is non-distended, soft and nontender. Normal bowel sounds heard. Central nervous system: Alert and oriented to self and partly to place. No focal neurological deficits. Extremities: Symmetric 5 x 5 power. Skin. Has a 4 cm x 2 cm stage 2 sacral decub   Data Reviewed: Basic Metabolic Panel:  Recent Labs Lab 06/16/14 0433 06/17/14 0537 06/18/14 0425 06/19/14 0532 06/20/14 0748  NA 143 139 138 137 140  K 3.8 3.4* 3.5* 3.2* 4.0  CL 110 106 103 101 106  CO2 17* 16* 19 22 21   GLUCOSE 120* 93 93 91 101*  BUN 77* 72* 65* 55* 45*  CREATININE 1.26* 1.15* 1.09 1.12* 1.07  CALCIUM 8.5 8.3* 8.3* 8.2* 7.7*   Liver Function Tests:  Recent Labs Lab 06/14/14 1725 06/15/14 0045 06/15/14 0419 06/16/14 0433 06/18/14 0425  AST 103* 78* 74* 76* 121*  ALT 92* 77* 72* 74* 106*  ALKPHOS 5* 333* 269* 229* 294*  BILITOT <0.2* 1.8* 1.6* 1.3* 1.6*  PROT 7.4 6.4 6.1 5.8* 5.8*  ALBUMIN 2.7* 2.3* 2.2* 2.0* 2.1*   No results found for this basename: LIPASE, AMYLASE,  in the last 168 hours  Recent Labs Lab 06/14/14 1727  AMMONIA 55   CBC:  Recent Labs Lab 06/14/14 1725 06/15/14 0419 06/16/14 0433 06/17/14 0537 06/18/14 0425 06/19/14 0532  WBC 13.5* 11.0* 7.7 8.0 9.6 11.7*  NEUTROABS 12.2*  --   --   --   --   --   HGB 16.8* 14.9 14.8 14.2 14.2 14.6  HCT 48.6* 44.7 43.4 41.1 41.6 42.7  MCV 98.6 100.0 99.1 96.5 95.4 95.5  PLT 126* PLATELET CLUMPS NOTED ON SMEAR, COUNT APPEARS ADEQUATE 101* 103* 106* 109*   Cardiac Enzymes: No results found for this basename: CKTOTAL, CKMB, CKMBINDEX, TROPONINI,  in the last 168 hours BNP (last 3 results)  Recent Labs   05/08/14 0525  PROBNP 71.3   CBG:  Recent Labs Lab 06/20/14 1235 06/20/14 1655 06/20/14 2226 06/21/14 0810 06/21/14 1150  GLUCAP 124* 89 119* 81 73    Recent Results (from the past 240 hour(s))  CLOSTRIDIUM DIFFICILE BY PCR     Status: Abnormal   Collection Time    06/15/14  3:05 PM      Result Value Ref Range Status   C difficile by pcr POSITIVE (*) NEGATIVE Final   Comment: CRITICAL RESULT CALLED TO, READ BACK BY AND VERIFIED WITH:     HOLLAND C RN 06/16/14 0838 COSTELLO B     Performed at [facility]           Studies: No results found.      Scheduled Meds: . antiseptic oral rinse  7 mL Mouth Rinse BID  . famotidine  20 mg Oral BID  . feeding supplement (ENSURE COMPLETE)  237 mL Oral TID BM  . nystatin  5 mL Oral QID  . saccharomyces boulardii  250 mg Oral BID  . vancomycin  250 mg  Oral 4 times per day   Continuous Infusions: . sodium chloride 75 mL/hr at 06/21/14 1106    Principal Problem:   C. difficile colitis Active Problems:   Hypertension   Cirrhosis   Portal hypertension   Hepatoma   Dehydration   Recurrent right pleural effusion   Oral thrush   Protein-calorie malnutrition, severe   Abdominal pain   Colitis    Time spent: 25 minutes.    Kelvin Cellar, MD, FACP, FHM. [facility] Pager [phone]  If 7PM-7AM, please contact night-coverage [email or web address] Password [email or web address] 06/21/2014, 12:13 PM    LOS: 7 days

## 2014-06-22 LAB — GLUCOSE, CAPILLARY
Glucose-Capillary: 94 mg/dL (ref 70–99)
Glucose-Capillary: 150 mg/dL — ABNORMAL HIGH (ref 70–99)

## 2014-06-22 MED ORDER — VANCOMYCIN 50 MG/ML ORAL SOLUTION: ORAL | Status: AC

## 2014-06-22 MED ORDER — QUETIAPINE 12.5 MG HALF TABLET: 12.5000 mg | ORAL_TABLET | Freq: Two times a day (BID) | ORAL | Status: AC | PRN

## 2014-06-22 MED ORDER — OXYCODONE HCL 5 MG PO TABS: 2.5000 mg | ORAL_TABLET | Freq: Four times a day (QID) | ORAL | Status: AC | PRN

## 2014-06-22 MED ORDER — ENSURE COMPLETE PO LIQD: 237.0000 mL | Freq: Three times a day (TID) | ORAL | Status: AC

## 2014-06-22 MED ORDER — VANCOMYCIN 50 MG/ML ORAL SOLUTION: ORAL | Status: DC

## 2014-06-22 NOTE — Progress Notes (Signed)
Pt having c/o of pain at IV site. Site assessed with no signs of redness or infiltration. Site flushed with no difficulty. IVF stopped at this time due to pt c/o of pain.

## 2014-06-22 NOTE — Discharge Summary (Addendum)
Physician Discharge Summary  [name] ASN:[id number] DOB: 02-23-1928 DOA: 06/14/2014  PCP: Alesia Richards, MD  Admit date: 06/14/2014 Discharge date: 06/22/2014  Time spent: 35 minutes  Recommendations for Outpatient Follow-up:  1. Please a BMP and CBC in 2-3 days 2. Pleurx catheter drainage each bedtime. May do q daily as needed for increased shortness of breath  Discharge Diagnoses:  Principal Problem:   C. difficile colitis Active Problems:   Hypertension   Cirrhosis   Portal hypertension   Hepatoma   Dehydration   Recurrent right pleural effusion   Oral thrush   Protein-calorie malnutrition, severe   Abdominal pain   Colitis   Discharge Condition: Stable/Improved  Diet recommendation: Protein boost 3 times a day, regular diet  Filed Weights   06/19/14 0503 06/20/14 0533 06/21/14 0506  Weight: 73.936 kg (163 lb) 81.2 kg (179 lb 0.2 oz) 77.7 kg (171 lb 4.8 oz)    History of present illness:  [name] is a 80 y.o. female with Past medical history of anemia, hypertension, angiodysplasia of the GI tract, GERD, diabetes, TIA, diverticulosis with recurrent diverticulitis and recent C. difficile colitis.  The patient is presenting with complaints of abdominal pain and generalized weakness.  History was obtained from patient's daughter who mentions that patient was improving after her recent admission and discharge from the hospital to the nursing home.  Since last 2 days patient has been having progressively decreasing appetite with decreasing activity and generalized weakness and as per the daughter she has not been able to participate adequately in her physical therapy.  Since this morning the patient has stopped eating and has been lying in the bed throughout the day.  Hospital Course:  Patient is an 80 year old female with a past medical history of recurrent C. difficile colitis, cirrhosis with portal hypertension, status post right Pleurx catheter placement  or pleural effusions related to hepatic disease, hypertension, type 2 diabetes mellitus who was admitted to medicine service on 06/14/2014. She presented with functional decline/failure to thrive and complaints of diarrhea. Patient worked up with CT scan of abdomen and pelvis which showed colonic wall thickening. Was found to be C. difficile positive. She was started on oral vancomycin therapy. Given persistent diarrhea her oral vancomycin dose was increased to 250 mg by mouth 4 times a day. She was also administered IV fluids during this hospitalization. Physical therapy was consulted as she showed clinical improvement. By 06/22/2014 nursing staff reporting just one bowel movement overnight. She was working well with physical therapy. Patient felt to be stable for transfer to skilled nursing facility.   1. Abdominal pain/C. difficile colitis: CT abdomen shows colonic wall thickening. Given prior history of C. difficile, ongoing loose stools and mild leukocytosis-there was high index of suspicion for recurrent C. difficile. C. difficile PCR positive. She had a negative PCR on 06/01/2014. DC empirically started IV Unasyn. Started on oral vancomycin course  .  Given increase in diarrhea, her oral vancomycin dose was increased to 250 mg PO QID. She continues to have diarrhea although there has been some improvement.  On 06/21/2014 patient showing gradual improvement, still has little appetite. Had 2 watery bowel movements this morning. Anticipate discharge in the next 24 hours if remains stable  By 06/22/2014 patient showing much improvement with nursing staff reporting only one loose bowel movement overnight. She was tolerating by mouth intake.  2. Generalized weakness/failure to thrive: Secondary to multiple medical complications complicating advanced age, plan to discharge to SNF when medically  stable 3. Dehydration: Patient was given IV fluids, still has poor PO intake 4. Acute kidney injury: Likely  prerenal. During this hospitalization she was administered IV fluids. By 06/12/2014 her BUN had trended down from 77 to 45. 5. Right pleural effusion secondary to hepatic disease: Status post Pleurx-drained nightly. Small pleural effusion on chest x-ray. Patient undergoing Pleurx catheter drainage each bedtime 6. Hypertension: Controlled off medications. 7. DM 2: Check CBGs. Controlled. 8. Cirrhosis with portal hypertension: Small volume ascites on CT. 9. Altered mental status/acute encephalopathy: Ammonia normal.? Baseline.? Secondary to acute illness complicating possible underlying dementia. Treat above medical conditions and follow clinically. Likely having delirium, stopped IV ativan and Remeron on 06/21/2014 and started as needed seroquel 12.5 PO BID for acute agitation.  10. Thrombocytopenia: May be secondary to cirrhosis. No reported bleeding. Stable 11. Hypokalemia: Potassium normalized, likely secondary to diarrhea.  12. Abnormal LFTs: Stable   Consultations:  Physical therapy  Social work  Discharge Exam: Filed Vitals:   06/22/14 0545  BP: 120/79  Pulse: 101  Temp: 98.2 F (36.8 C)  Resp: 20  General exam: Elderly frail chronically ill-looking female sitting up comfortably in bed.  Respiratory system: Poor inspiratory effort, diminished breath sounds in the bases but otherwise clear to auscultation. No increased work of breathing. Right Pleurx intact.  Cardiovascular system: S1 & S2 heard, RRR. No JVD, murmurs, gallops, clicks or pedal edema.  Gastrointestinal system: Abdomen is non-distended, soft and nontender. Normal bowel sounds heard.  Central nervous system: Alert and oriented to self and partly to place. No focal neurological deficits.  Extremities: Symmetric 5 x 5 power.  Skin. Has a 4 cm x 2 cm stage 2 sacral decub   Discharge Instructions You were cared for by a hospitalist during your hospital stay. If you have any questions about your discharge medications or  the care you received while you were in the hospital after you are discharged, you can call the unit and asked to speak with the hospitalist on call if the hospitalist that took care of you is not available. Once you are discharged, your primary care physician will handle any further medical issues. Please note that NO REFILLS for any discharge medications will be authorized once you are discharged, as it is imperative that you return to your primary care physician (or establish a relationship with a primary care physician if you do not have one) for your aftercare needs so that they can reassess your need for medications and monitor your lab values.  Discharge Instructions   Call MD for:  difficulty breathing, headache or visual disturbances    Complete by:  As directed      Call MD for:  extreme fatigue    Complete by:  As directed      Call MD for:  persistant dizziness or light-headedness    Complete by:  As directed      Call MD for:  persistant nausea and vomiting    Complete by:  As directed      Call MD for:  redness, tenderness, or signs of infection (pain, swelling, redness, odor or green/yellow discharge around incision site)    Complete by:  As directed      Call MD for:  severe uncontrolled pain    Complete by:  As directed      Call MD for:  temperature >100.4    Complete by:  As directed      Diet - low sodium heart healthy  Complete by:  As directed      Increase activity slowly    Complete by:  As directed           Current Discharge Medication List    START taking these medications   Details  feeding supplement, ENSURE COMPLETE, (ENSURE COMPLETE) LIQD Take 237 mLs by mouth 3 (three) times daily between meals. Qty: 20 Bottle, Refills: 1    oxyCODONE (OXY IR/ROXICODONE) 5 MG immediate release tablet Take 0.5 tablets (2.5 mg total) by mouth every 6 (six) hours as needed for moderate pain. Qty: 30 tablet, Refills: 0    QUEtiapine (SEROQUEL) 12.5 mg TABS tablet Take  0.5 tablets (12.5 mg total) by mouth 2 (two) times daily as needed (Agitation). Qty: 60 tablet, Refills: 0    vancomycin (VANCOCIN) 50 mg/mL oral solution Take 125 mg PO QID for 2 weeks, followed by 125 mg PO BID for two weeks, followed 125 mg mg PO q daily for 1 week, followed by 125 mg QOD for 1 week then stop Qty: 1 mL, Refills: 0      CONTINUE these medications which have NOT CHANGED   Details  acetaminophen (TYLENOL) 325 MG tablet Take 650 mg by mouth every 8 (eight) hours.    cholecalciferol (VITAMIN D) 1000 UNITS tablet Take 1,000 Units by mouth daily.    Multiple Vitamin (MULTIVITAMIN WITH MINERALS) TABS tablet Take 1 tablet by mouth daily.    nystatin (MYCOSTATIN) 100000 UNIT/ML suspension Take 5 mLs (500,000 Units total) by mouth 4 (four) times daily. Qty: 60 mL, Refills: 0    pantoprazole (PROTONIX) 40 MG tablet Take 40 mg by mouth 2 (two) times daily.    saccharomyces boulardii (FLORASTOR) 250 MG capsule Take 1 capsule (250 mg total) by mouth 2 (two) times daily.    Sodium Chloride Flush (NORMAL SALINE FLUSH) 0.9 % SOLN Inject 10 mLs into the vein every 8 (eight) hours. For uti, flush both ports      STOP taking these medications     megestrol (MEGACE) 40 MG/ML suspension      mirtazapine (REMERON) 15 MG tablet      oxycodone (OXY-IR) 5 MG capsule        Allergies  Allergen Reactions  . Avelox [Moxifloxacin Hcl In Nacl] Anaphylaxis  . Statins Other (See Comments)    Liver function elevations  . Sulfa Antibiotics Hives and Itching  . Ace Inhibitors     Unknown  . Doxycycline     Unknown  . Infed [Iron Dextran] Other (See Comments)    IV IRON, Unknown  . Infergen [Interferon Alfacon-1]   . Vancomycin Other (See Comments)    Flushing, erythema to neck and face, tolerated vanc at lower infusion rate  . Ciprofloxacin Other (See Comments)    Complains of sore mouth  . Fenofibrate Other (See Comments)    constipation   Follow-up Information   Follow up  with MCKEOWN,WILLIAM DAVID, MD In 1 week.   Specialty:  Internal Medicine   Contact information:   [address] [place] [address] [phone]        The results of significant diagnostics from this hospitalization (including imaging, microbiology, ancillary and laboratory) are listed below for reference.    Significant Diagnostic Studies: Dg Chest 2 View  06/14/2014   CLINICAL DATA:  Pleural effusion, altered mental status  EXAM: CHEST  2 VIEW  COMPARISON:  06/07/2014  FINDINGS: Right-sided chest tube directed towards the apex in unchanged position. No pneumothorax.  Small right pleural effusion.  The left lung is clear.  No left pleural effusion or pneumothorax.  Stable cardiomediastinal silhouette. Thoracic aortic atherosclerosis. Unremarkable osseous structures.  IMPRESSION: 1. Right-sided chest tube in satisfactory position without a pneumothorax. Small right pleural effusion.   Electronically Signed   By: Kathreen Devoid   On: 06/14/2014 18:36   Ct Head Wo Contrast  06/06/2014   CLINICAL DATA:  Altered mental status  EXAM: CT HEAD WITHOUT CONTRAST  TECHNIQUE: Contiguous axial images were obtained from the base of the skull through the vertex without intravenous contrast.  COMPARISON:  April 25, 2014  FINDINGS: There is no midline shift, hydrocephalus, or mass. No acute hemorrhage or acute transcortical infarct is identified. There is chronic diffuse atrophy. Chronic bilateral periventricular white matter small vessel ischemic change is noted. The bony calvarium is intact. The visualized sinuses are clear.  IMPRESSION: No focal acute intracranial abnormality identified. Chronic diffuse atrophy. Chronic bilateral periventricular white matter small vessel ischemic change.   Electronically Signed   By: Abelardo Diesel M.D.   On: 06/06/2014 13:56   Ct Abdomen Pelvis W Contrast  06/14/2014   CLINICAL DATA:  Abdominal pain.  EXAM: CT ABDOMEN AND PELVIS WITH CONTRAST  TECHNIQUE:  Multidetector CT imaging of the abdomen and pelvis was performed using the standard protocol following bolus administration of intravenous contrast.  CONTRAST:  26mL OMNIPAQUE IOHEXOL 300 MG/ML  SOLN  COMPARISON:  04/28/2014  FINDINGS: BODY WALL: Unremarkable.  LOWER CHEST: Large sliding-type hiatal hernia. Avid enhancement of the lower esophagus compatible with varices. There is a small right pleural effusion with pleural drain that appears well positioned. Mild dependent atelectasis.  ABDOMEN/PELVIS:  Liver: Cirrhotic liver morphology. Stable appearance of subcapsular mass in the inferior right liver measuring 25 mm in maximal diameter, a hepatoma ablation site. On delayed phase in segment 6 there is a 10 mm area of low density (image 8) not seen on the earlier phase. On the same image, more medially is a chronic 7 mm low-density nodule on delayed phase  Biliary: Distended gallbladder. No evidence of active inflammation or calcified stone.  Pancreas: Unremarkable.  Spleen: No splenomegaly.  Prominent splenule near the hilum.  Adrenals: Unremarkable.  Kidneys and ureters: Mild renal cortical thinning. No hydronephrosis or inflammatory change.  Bladder: Decompressed by Foley catheter.  Reproductive: Hysterectomy.  Ovaries unremarkable for age.  Bowel: IMA varices with hemorrhoids. There is diffuse thickening of of the distal colonic wall, best seen in the descending portion. No bowel obstruction. No pericecal inflammation.  Retroperitoneum: Retroperitoneal edema which is chronic and likely from portal hypertension. Chronic mild enlargement of lymph nodes within the upper abdominal ligaments, likely reactive to the patient's cirrhosis.  Peritoneum: Small volume ascites.  Vascular: No acute abnormality.  OSSEOUS: No acute abnormalities.  IMPRESSION: 1. Colonic wall thickening which could be from mild colitis or venous congestion. 2. Cirrhosis with portal hypertension and treated hepatoma. Newly seen 1 cm nodule in  segment 6 on delayed phase. Recommend nonemergent liver MRI to evaluate for new hepatocellular carcinoma. 3. Chronic findings described above.   Electronically Signed   By: [name] M.D.   On: 06/14/2014 22:20   Dg Chest Port 1 View  06/07/2014   CLINICAL DATA:  Improved cough.  Short of breath.  Low grade fever.  EXAM: PORTABLE CHEST - 1 VIEW  COMPARISON:  06/02/2014.  FINDINGS: Stable right chest tube with its tip near the right apex. No pneumothorax.  Hazy right base opacity obscures  hemidiaphragm consistent with pleural fluid. There is likely associated atelectasis. Infiltrate is not excluded. Mild opacity at the left lung base is suggested but not well evaluated due to the superimposed cardiac silhouette. Atelectasis is suspected. Infiltrate is not excluded.  No pulmonary edema. No convincing left pleural effusion. Cardiac silhouette is normal in size. No mediastinal or hilar masses.  IMPRESSION: 1. Stable chest tube on the right.  No pneumothorax. 2. Right pleural effusion. Probable bibasilar atelectasis. Lung base pneumonia/infiltrate is possible. 3. No pulmonary edema. 4. Findings similar to the prior exam.   Electronically Signed   By: Lajean Manes M.D.   On: 06/07/2014 14:05   Dg Chest Port 1 View  06/02/2014   CLINICAL DATA:  Right pleural drain  EXAM: PORTABLE CHEST - 1 VIEW  COMPARISON:  06/01/2014  FINDINGS: Interval decrease in right pleural effusion. Right Chest tube remains in place extending into the lung apex. No pneumothorax.  Bibasilar atelectasis unchanged. Small left effusion is present and a small to moderate right effusion remains.  IMPRESSION: Interval improvement in right pleural effusion.  No pneumothorax.   Electronically Signed   By: Franchot Gallo M.D.   On: 06/02/2014 08:01   Dg Chest Port 1 View  06/01/2014   CLINICAL DATA:  Increased shortness of Breath  EXAM: PORTABLE CHEST - 1 VIEW  COMPARISON:  05/25/2014  FINDINGS: A PleurX catheter remains in the right hemi  thorax. Increasing density is noted consistent with an enlarging pleural effusion. The cardiac shadow is stable. The left lung remains clear. No bony abnormality is noted.  IMPRESSION: Increasing right-sided pleural effusion.   Electronically Signed   By: Inez Catalina M.D.   On: 06/01/2014 15:34   Dg Chest Port 1 View  05/25/2014   CLINICAL DATA:  Pleural effusion  EXAM: PORTABLE CHEST - 1 VIEW  COMPARISON:  05/22/2014  FINDINGS: Cardiomediastinal silhouette is stable. There is right chest tube with tip in right apex . Small right pleural effusion with right basilar atelectasis or infiltrate. Left lung is clear.  IMPRESSION: Right chest tube in place. Small right pleural effusion right basilar atelectasis or infiltrate.   Electronically Signed   By: Lahoma Crocker M.D.   On: 05/25/2014 12:49    Microbiology: Recent Results (from the past 240 hour(s))  CLOSTRIDIUM DIFFICILE BY PCR     Status: Abnormal   Collection Time    06/15/14  3:05 PM      Result Value Ref Range Status   C difficile by pcr POSITIVE (*) NEGATIVE Final   Comment: CRITICAL RESULT CALLED TO, READ BACK BY AND VERIFIED WITH:     HOLLAND C RN 06/16/14 0838 COSTELLO B     Performed at [facility]: Basic Metabolic Panel:  Recent Labs Lab 06/16/14 0433 06/17/14 0537 06/18/14 0425 06/19/14 0532 06/20/14 0748  NA 143 139 138 137 140  K 3.8 3.4* 3.5* 3.2* 4.0  CL 110 106 103 101 106  CO2 17* 16* 19 22 21   GLUCOSE 120* 93 93 91 101*  BUN 77* 72* 65* 55* 45*  CREATININE 1.26* 1.15* 1.09 1.12* 1.07  CALCIUM 8.5 8.3* 8.3* 8.2* 7.7*   Liver Function Tests:  Recent Labs Lab 06/16/14 0433 06/18/14 0425  AST 76* 121*  ALT 74* 106*  ALKPHOS 229* 294*  BILITOT 1.3* 1.6*  PROT 5.8* 5.8*  ALBUMIN 2.0* 2.1*   No results found for this basename: LIPASE, AMYLASE,  in the last 168 hours No  results found for this basename: AMMONIA,  in the last 168 hours CBC:  Recent Labs Lab 06/16/14 0433 06/17/14 0537  06/18/14 0425 06/19/14 0532  WBC 7.7 8.0 9.6 11.7*  HGB 14.8 14.2 14.2 14.6  HCT 43.4 41.1 41.6 42.7  MCV 99.1 96.5 95.4 95.5  PLT 101* 103* 106* 109*   Cardiac Enzymes: No results found for this basename: CKTOTAL, CKMB, CKMBINDEX, TROPONINI,  in the last 168 hours BNP: BNP (last 3 results)  Recent Labs  05/08/14 0525  PROBNP 71.3   CBG:  Recent Labs Lab 06/21/14 0810 06/21/14 1150 06/21/14 1719 06/21/14 2112 06/22/14 0721  GLUCAP 81 73 138* 135* 94       Signed:  Ramonia Mcclaran  [facility] 06/22/2014, 11:32 AM

## 2014-06-22 NOTE — Plan of Care (Signed)
Problem: Discharge Progression Outcomes Goal: Discharge plan in place and appropriate [facility] living report given to [facility]

## 2014-06-22 NOTE — Progress Notes (Signed)
Pt complaining of a lot of noise in her ears. Dr. Coralyn Pear in to look at her ears and stated she had a lot of wax in the canal. Told to [place] living when report given. Daughter is also aware.

## 2014-06-22 NOTE — Progress Notes (Signed)
Physical Therapy Treatment Patient Details Name: [name] MRN: [id number] DOB: November 27, 1927 Today's Date: 06/22/2014    History of Present Illness 80 year old female with history of anemia, HTN, DM 2, recurrent diverticulitis, cirrhosis with portal hypertension, right Pleurx catheter placed for pleural effusion related to hepatic disease, admitted with abdominal pain, c. diff and generalized weakness.    PT Comments    Pt in bed AxO x 3 pleasant.  Stated feeling "better".  Assisted pt from supine to EOB.  Pt was more able to self assist.  Attempt sit to stand + 1 using RW however unable to push up enough to clear hips off bed.  Still too weak.  Using "[facility]" stand pivot assisted from elevated bed 1/4 turn to [facility].  Pt cont to have BM's but appears more formed.  Total assist for hygiene.  Same transfer tech assisted off BSC to recliner.  Pt 25%.  Follow Up Recommendations  SNF     Equipment Recommendations       Recommendations for Other Services       Precautions / Restrictions Precautions Precautions: Fall Precaution Comments: loose stools Restrictions Weight Bearing Restrictions: No    Mobility  Bed Mobility Overal bed mobility: Needs Assistance Bed Mobility: Supine to Sit     Supine to sit: Max assist     General bed mobility comments: much improved.  Pt was able to use rail and transfer self from supine to partial sitting.  HOB elevated nearly 75 degrees.  Required increased time and use of pad to scoot hips to EOB.  Pt was able to Indep sit EOB x 5 min.    Transfers Overall transfer level: Needs assistance Equipment used: None Transfers: Stand Pivot Transfers   Stand pivot transfers: Max assist       General transfer comment: attempted sit to stand + 1 with RW however pt unable to push up enough to clear hips.  So assisted from elevated bed to [facility] using "bear hug" squat pivot/partial 1/4 turn.  Pt still very weak.    Ambulation/Gait         Gait  velocity: unable to attempt due to low perfomance with transfers       Stairs            Wheelchair Mobility    Modified Rankin (Stroke Patients Only)       Balance                                    Cognition                            Exercises      General Comments        Pertinent Vitals/Pain      Home Living                      Prior Function            PT Goals (current goals can now be found in the care plan section) Progress towards PT goals: Progressing toward goals    Frequency  Min 3X/week    PT Plan      Co-evaluation             End of Session Equipment Utilized During Treatment: Gait belt Activity Tolerance: Patient limited by fatigue Patient left: in chair;with  call bell/phone within reach     Time: 1100-1125 PT Time Calculation (min): 25 min  Charges:  $Therapeutic Activity: 23-37 mins                    G Codes:      Rica Koyanagi  PTA [facility]  Acute  Rehab Pager      [phone]

## 2014-06-22 NOTE — Progress Notes (Signed)
Pt for discharge to [facility].  CSW facilitated pt discharge needs including contacting facility, faxing pt discharge information via [place] with pt and pt daughter, Suanne Marker at bedside, providing RN phone number to call report, and arranging ambulance transport for pt to [facility]  Pt reports that she is feeling better and pt daughter questions clarified regarding pt follow up. Pt daughter appreciative of CSW support and assistance throughout hospitalization.  No further social work needs identified at this time.  CSW signing off.   Alison Murray, MSW, [employer] Work [phone]

## 2014-06-23 ENCOUNTER — Non-Acute Institutional Stay (SKILLED_NURSING_FACILITY): Payer: Medicare Other | Admitting: Internal Medicine

## 2014-06-23 ENCOUNTER — Encounter: Payer: Self-pay | Admitting: Internal Medicine

## 2014-06-23 DIAGNOSIS — E43 Unspecified severe protein-calorie malnutrition: Secondary | ICD-10-CM

## 2014-06-23 DIAGNOSIS — F32A Depression, unspecified: Secondary | ICD-10-CM

## 2014-06-23 DIAGNOSIS — F329 Major depressive disorder, single episode, unspecified: Secondary | ICD-10-CM

## 2014-06-23 DIAGNOSIS — R531 Weakness: Secondary | ICD-10-CM

## 2014-06-23 DIAGNOSIS — R160 Hepatomegaly, not elsewhere classified: Secondary | ICD-10-CM

## 2014-06-23 DIAGNOSIS — A047 Enterocolitis due to Clostridium difficile: Secondary | ICD-10-CM

## 2014-06-23 DIAGNOSIS — K219 Gastro-esophageal reflux disease without esophagitis: Secondary | ICD-10-CM

## 2014-06-23 DIAGNOSIS — N179 Acute kidney failure, unspecified: Secondary | ICD-10-CM

## 2014-06-23 DIAGNOSIS — A0472 Enterocolitis due to Clostridium difficile, not specified as recurrent: Secondary | ICD-10-CM

## 2014-06-23 DIAGNOSIS — J90 Pleural effusion, not elsewhere classified: Secondary | ICD-10-CM

## 2014-06-23 NOTE — Progress Notes (Signed)
Patient ID: [name], female   DOB: [birth year], 80 y.o.   MRN: [id number]     Facility: [facility]   PCP: Alesia Richards, MD  Code Status: DNR  Allergies  Allergen Reactions  . Avelox [Moxifloxacin Hcl In Nacl] Anaphylaxis  . Statins Other (See Comments)    Liver function elevations  . Sulfa Antibiotics Hives and Itching  . Ace Inhibitors     Unknown  . Doxycycline     Unknown  . Infed [Iron Dextran] Other (See Comments)    IV IRON, Unknown  . Infergen [Interferon Alfacon-1]   . Vancomycin Other (See Comments)    Flushing, erythema to neck and face, tolerated vanc at lower infusion rate  . Ciprofloxacin Other (See Comments)    Complains of sore mouth  . Fenofibrate Other (See Comments)    constipation    Chief Complaint: new admission  HPI:  80 y/o female patient is here for STR after hospital admission from 06/14/14-06/22/14 with functional decline and loose stool. CT scan of abdomen and pelvis showed colonic wall thickening and c.diff PCR was positive. She was started on oral vancomycin, iv fluids and had clinical improvement She has history of liver cirrhosis with portal hypertension, recurrent pleural effusion s/p right pleurx catheter, recurrent c.diff colitis, hypertension, type 2 diabetes mellitus. She is here for rehabilitation. She denies any further loose stool. She denies any abdominal pain. No nausea or vomiting. Her breathing is stable at present and her pleurx is being drained twice a day at present. She has generalized weakness.  Review of Systems:  Constitutional: Negative for fever, chills, diaphoresis.  HENT: Negative for congestion Eyes: Negative for eye pain, blurred vision Respiratory: Negative for cough, wheezing.  gets dyspneic easily Cardiovascular: Negative for chest pain, palpitations Gastrointestinal: Negative for heartburn, nausea, vomiting, abdominal pain  Genitourinary: Negative for dysuria Musculoskeletal:  Negative for back pain, falls Skin: Negative for itching and rash.  Neurological: Negative for dizziness, focal weakness and headaches.  Psychiatric/Behavioral: feels depressed   Past Medical History  Diagnosis Date  . Anemia, iron deficiency 08/07/2011  . Angiodysplasia of intestinal tract 08/07/2011  . Hypertension   . Arthritis   . Diverticulitis   . Phlebitis   . Macular degeneration   . TIA (transient ischemic attack)   . MI (myocardial infarction)   . Hiatal hernia   . AVM (arteriovenous malformation) of colon with hemorrhage   . Varicose veins of legs   . Cataract   . GERD (gastroesophageal reflux disease)   . DJD (degenerative joint disease)   . Diabetes mellitus     borderline  . History of blood transfusion   . PONV (postoperative nausea and vomiting)   . Edema leg   . Fatty liver   . Vitamin D deficiency   . hepatocellar ca dx'd 08/2012   Past Surgical History  Procedure Laterality Date  . 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 Abdominal hysterectomy    . Hemorrhoid surgery    . Esophagogastroduodenoscopy  08/30/2012    Procedure: ESOPHAGOGASTRODUODENOSCOPY (EGD);  Surgeon: Jeryl Columbia, MD;  Location: [facility];  Service: Endoscopy;  Laterality: N/A;  . Radiofrequency ablation liver tumor Right 01/2013    HFA ablation right liver lesion  . Colonoscopy  06/2009    Dr. Watt Climes, due 5 years  . Endoscopic vein laser treatment Left 2014    Dr. Kellie Simmering   Social History:   reports that she quit smoking about  23 years ago. Her smoking use included Cigarettes. She started smoking about 63 years ago. She has a 200 pack-year smoking history. She has never used smokeless tobacco. She reports that she does not drink alcohol or use illicit drugs.  Family History  Problem Relation Age of Onset  . Colon cancer Sister   . Coronary artery disease Father   . Heart disease Father     Medications: Patient's Medications  New Prescriptions   No medications  on file  Previous Medications   ACETAMINOPHEN (TYLENOL) 325 MG TABLET    Take 650 mg by mouth every 8 (eight) hours.   CHOLECALCIFEROL (VITAMIN D) 1000 UNITS TABLET    Take 1,000 Units by mouth daily.   FEEDING SUPPLEMENT, ENSURE COMPLETE, (ENSURE COMPLETE) LIQD    Take 237 mLs by mouth 3 (three) times daily between meals.   MULTIPLE VITAMIN (MULTIVITAMIN WITH MINERALS) TABS TABLET    Take 1 tablet by mouth daily.   NYSTATIN (MYCOSTATIN) 100000 UNIT/ML SUSPENSION    Take 5 mLs (500,000 Units total) by mouth 4 (four) times daily.   OXYCODONE (OXY IR/ROXICODONE) 5 MG IMMEDIATE RELEASE TABLET    Take 0.5 tablets (2.5 mg total) by mouth every 6 (six) hours as needed for moderate pain.   PANTOPRAZOLE (PROTONIX) 40 MG TABLET    Take 40 mg by mouth 2 (two) times daily.   QUETIAPINE (SEROQUEL) 12.5 MG TABS TABLET    Take 0.5 tablets (12.5 mg total) by mouth 2 (two) times daily as needed (Agitation).   SACCHAROMYCES BOULARDII (FLORASTOR) 250 MG CAPSULE    Take 1 capsule (250 mg total) by mouth 2 (two) times daily.   SODIUM CHLORIDE FLUSH (NORMAL SALINE FLUSH) 0.9 % SOLN    Inject 10 mLs into the vein every 8 (eight) hours. For uti, flush both ports   VANCOMYCIN (VANCOCIN) 50 MG/ML ORAL SOLUTION    Take 125 mg PO QID for 2 weeks, followed by 125 mg PO BID for two weeks, followed 125 mg mg PO q daily for 1 week, followed by 125 mg QOD for 1 week then stop  Modified Medications   No medications on file  Discontinued Medications   No medications on file     Physical Exam: Filed Vitals:   06/23/14 1346  BP: 118/87  Pulse: 87  Temp: 97.7 F (36.5 C)  Resp: 18  Height: 5\' 5"  (1.651 m)  Weight: 168 lb (76.204 kg)  SpO2: 95%    General- elderly female in no acute distress, frail and chronically ill appearing Head- atraumatic, normocephalic Eyes- PERRLA, EOMI, no pallor, no icterus, no discharge Neck- no lymphadenopathy Throat- moist mucus membrane Cardiovascular- normal s1,s2, no murmurs, leg 2+  edema, stasis changes in skin of both leg and skin breakdown on left inner leg noted Respiratory- bilateral decreased air entry mainly at bases, right pleurx in place, no use of accessory muscles  Abdomen- bowel sounds present, soft, non tender Musculoskeletal- able to move all 4 extremities, generalized weakness, no leg edema Neurological- no focal deficit Skin- warm and dry Psychiatry- alert and oriented to person, place and time, somewhat flat affect    Labs reviewed: Basic Metabolic Panel:  Recent Labs  05/12/14 0630  05/20/14 0728  05/23/14 0535 05/26/14 0640  06/18/14 0425 06/19/14 0532 06/20/14 0748  NA 136*  < > 137  < > 137 137  < > 138 137 140  K 4.4  < > 4.8  < > 4.7 3.9  < > 3.5* 3.2*  4.0  CL 105  < > 106  < > 106 105  < > 103 101 106  CO2 19  < > 22  < > 21 20  < > 19 22 21   GLUCOSE 87  < > 95  < > 111* 98  < > 93 91 101*  BUN 42*  < > 38*  < > 42* 36*  < > 65* 55* 45*  CREATININE 1.14*  < > 1.15*  < > 1.14* 1.10  < > 1.09 1.12* 1.07  CALCIUM 9.3  < > 9.0  < > 9.1 8.5  < > 8.3* 8.2* 7.7*  MG 2.0  --   --   --  2.1 2.2  --   --   --   --   PHOS 2.9  --  2.9  --  3.1 2.9  --   --   --   --   < > = values in this interval not displayed. Liver Function Tests:  Recent Labs  06/15/14 0419 06/16/14 0433 06/18/14 0425  AST 74* 76* 121*  ALT 72* 74* 106*  ALKPHOS 269* 229* 294*  BILITOT 1.6* 1.3* 1.6*  PROT 6.1 5.8* 5.8*  ALBUMIN 2.2* 2.0* 2.1*    Recent Labs  04/25/14 1706  LIPASE 53    Recent Labs  06/14/14 1727  AMMONIA 55   CBC:  Recent Labs  06/06/14 0710 06/10/14 0640 06/14/14 1725  06/17/14 0537 06/18/14 0425 06/19/14 0532  WBC 10.5 10.8* 13.5*  < > 8.0 9.6 11.7*  NEUTROABS 7.2 8.1* 12.2*  --   --   --   --   HGB 15.2* 14.8 16.8*  < > 14.2 14.2 14.6  HCT 44.7 42.6 48.6*  < > 41.1 41.6 42.7  MCV 99.6 101.6* 98.6  < > 96.5 95.4 95.5  PLT 94* 90* 126*  < > 103* 106* 109*  < > = values in this interval not displayed. Cardiac  Enzymes:  Recent Labs  09/05/13 6237 04/17/14 1121 04/25/14 1706  TROPONINI <0.30 <0.30 <0.30   BNP: No components found with this basename: POCBNP,  CBG:  Recent Labs  06/21/14 2112 06/22/14 0721 06/22/14 1133  GLUCAP 135* 94 150*    Radiological Exams: Dg Chest 2 View  06/14/2014   CLINICAL DATA:  Pleural effusion, altered mental status  EXAM: CHEST  2 VIEW  COMPARISON:  06/07/2014  FINDINGS: Right-sided chest tube directed towards the apex in unchanged position. No pneumothorax. Small right pleural effusion.  The left lung is clear.  No left pleural effusion or pneumothorax.  Stable cardiomediastinal silhouette. Thoracic aortic atherosclerosis. Unremarkable osseous structures.  IMPRESSION: 1. Right-sided chest tube in satisfactory position without a pneumothorax. Small right pleural effusion.   Electronically Signed   By: Kathreen Devoid   On: 06/14/2014 18:36   Ct Head Wo Contrast  06/06/2014   CLINICAL DATA:  Altered mental status  EXAM: CT HEAD WITHOUT CONTRAST  TECHNIQUE: Contiguous axial images were obtained from the base of the skull through the vertex without intravenous contrast.  COMPARISON:  April 25, 2014  FINDINGS: There is no midline shift, hydrocephalus, or mass. No acute hemorrhage or acute transcortical infarct is identified. There is chronic diffuse atrophy. Chronic bilateral periventricular white matter small vessel ischemic change is noted. The bony calvarium is intact. The visualized sinuses are clear.  IMPRESSION: No focal acute intracranial abnormality identified. Chronic diffuse atrophy. Chronic bilateral periventricular white matter small vessel ischemic change.   Electronically Signed  By: Abelardo Diesel M.D.   On: 06/06/2014 13:56   Ct Abdomen Pelvis W Contrast  06/14/2014   CLINICAL DATA:  Abdominal pain.  EXAM: CT ABDOMEN AND PELVIS WITH CONTRAST  TECHNIQUE: Multidetector CT imaging of the abdomen and pelvis was performed using the standard protocol following  bolus administration of intravenous contrast.  CONTRAST:  37mL OMNIPAQUE IOHEXOL 300 MG/ML  SOLN  COMPARISON:  04/28/2014  FINDINGS: BODY WALL: Unremarkable.  LOWER CHEST: Large sliding-type hiatal hernia. Avid enhancement of the lower esophagus compatible with varices. There is a small right pleural effusion with pleural drain that appears well positioned. Mild dependent atelectasis.  ABDOMEN/PELVIS:  Liver: Cirrhotic liver morphology. Stable appearance of subcapsular mass in the inferior right liver measuring 25 mm in maximal diameter, a hepatoma ablation site. On delayed phase in segment 6 there is a 10 mm area of low density (image 8) not seen on the earlier phase. On the same image, more medially is a chronic 7 mm low-density nodule on delayed phase  Biliary: Distended gallbladder. No evidence of active inflammation or calcified stone.  Pancreas: Unremarkable.  Spleen: No splenomegaly.  Prominent splenule near the hilum.  Adrenals: Unremarkable.  Kidneys and ureters: Mild renal cortical thinning. No hydronephrosis or inflammatory change.  Bladder: Decompressed by Foley catheter.  Reproductive: Hysterectomy.  Ovaries unremarkable for age.  Bowel: IMA varices with hemorrhoids. There is diffuse thickening of of the distal colonic wall, best seen in the descending portion. No bowel obstruction. No pericecal inflammation.  Retroperitoneum: Retroperitoneal edema which is chronic and likely from portal hypertension. Chronic mild enlargement of lymph nodes within the upper abdominal ligaments, likely reactive to the patient's cirrhosis.  Peritoneum: Small volume ascites.  Vascular: No acute abnormality.  OSSEOUS: No acute abnormalities.  IMPRESSION: 1. Colonic wall thickening which could be from mild colitis or venous congestion. 2. Cirrhosis with portal hypertension and treated hepatoma. Newly seen 1 cm nodule in segment 6 on delayed phase. Recommend nonemergent liver MRI to evaluate for new hepatocellular carcinoma.  3. Chronic findings described above.   Electronically Signed   By: [name] M.D.   On: 06/14/2014 22:20   Dg Chest Port 1 View  06/07/2014   CLINICAL DATA:  Improved cough.  Short of breath.  Low grade fever.  EXAM: PORTABLE CHEST - 1 VIEW  COMPARISON:  06/02/2014.  FINDINGS: Stable right chest tube with its tip near the right apex. No pneumothorax.  Hazy right base opacity obscures hemidiaphragm consistent with pleural fluid. There is likely associated atelectasis. Infiltrate is not excluded. Mild opacity at the left lung base is suggested but not well evaluated due to the superimposed cardiac silhouette. Atelectasis is suspected. Infiltrate is not excluded.  No pulmonary edema. No convincing left pleural effusion. Cardiac silhouette is normal in size. No mediastinal or hilar masses.  IMPRESSION: 1. Stable chest tube on the right.  No pneumothorax. 2. Right pleural effusion. Probable bibasilar atelectasis. Lung base pneumonia/infiltrate is possible. 3. No pulmonary edema. 4. Findings similar to the prior exam.   Electronically Signed   By: Lajean Manes M.D.   On: 06/07/2014 14:05   Dg Chest Port 1 View  06/02/2014   CLINICAL DATA:  Right pleural drain  EXAM: PORTABLE CHEST - 1 VIEW  COMPARISON:  06/01/2014  FINDINGS: Interval decrease in right pleural effusion. Right Chest tube remains in place extending into the lung apex. No pneumothorax.  Bibasilar atelectasis unchanged. Small left effusion is present and a small to moderate right effusion  remains.  IMPRESSION: Interval improvement in right pleural effusion.  No pneumothorax.   Electronically Signed   By: Franchot Gallo M.D.   On: 06/02/2014 08:01   Dg Chest Port 1 View  06/01/2014   CLINICAL DATA:  Increased shortness of Breath  EXAM: PORTABLE CHEST - 1 VIEW  COMPARISON:  05/25/2014  FINDINGS: A PleurX catheter remains in the right hemi thorax. Increasing density is noted consistent with an enlarging pleural effusion. The cardiac shadow is  stable. The left lung remains clear. No bony abnormality is noted.  IMPRESSION: Increasing right-sided pleural effusion.   Electronically Signed   By: Inez Catalina M.D.   On: 06/01/2014 15:34   Dg Chest Port 1 View  05/25/2014   CLINICAL DATA:  Pleural effusion  EXAM: PORTABLE CHEST - 1 VIEW  COMPARISON:  05/22/2014  FINDINGS: Cardiomediastinal silhouette is stable. There is right chest tube with tip in right apex . Small right pleural effusion with right basilar atelectasis or infiltrate. Left lung is clear.  IMPRESSION: Right chest tube in place. Small right pleural effusion right basilar atelectasis or infiltrate.   Electronically Signed   By: Lahoma Crocker M.D.   On: 05/25/2014 12:49    Assessment/Plan  Generalized weakness From her deconditioning. Will have patient work with PT/OT as tolerated to regain strength and restore function.  Fall precautions are in place.  Depression Start her on remeron 15 mg daily for now and reassess. This will help with her appetite as well  C.diff colitis No further loose stool. Continue po vancomycin ad to complete her course. Continue probiotics  Pleural effusion S/p pleurx drain in place, to be drained daily and as needed for dyspnea. On prn pain med, no changes made  New hepatic mass Seen on ct abdomen. Has hx of HCC. Will need family meeting to review care plan further. Patient is s/p radioablation in the past.  Protein calorie malnutrition Check prealbumin level. To take protein supplement, continue skin care and pressure ulcer prophylaxis. Decline anticipated. Will need to organize family meeting to review care plan  gerd Continue pantoprazole 40 mg bid  Leg edema Likely from venous pooling and also some from third spacing with her malnutrition. Keep legs elevated at rest for now. Monitor clinically  AKI Monitor bmp   Family/ staff Communication: reviewed care plan with patient and nursing supervisor  Goals of care: short term  rehabilitation    Labs/tests ordered: cbc with diff, cmp, prealbumin    Blanchie Serve, MD  [facility] [phone] (Monday-Friday 8 am - 5 pm) [phone] (afterhours)

## 2014-06-24 ENCOUNTER — Other Ambulatory Visit: Payer: Self-pay | Admitting: Internal Medicine

## 2014-06-24 LAB — CBC WITH DIFFERENTIAL/PLATELET
Basophils Absolute: 0 10*3/uL (ref 0.0–0.1)
Basophils Relative: 0 % (ref 0–1)
EOS PCT: 2 % (ref 0–5)
Eosinophils Absolute: 0.2 10*3/uL (ref 0.0–0.7)
HCT: 40.7 % (ref 36.0–46.0)
HEMOGLOBIN: 14.1 g/dL (ref 12.0–15.0)
LYMPHS ABS: 1.5 10*3/uL (ref 0.7–4.0)
Lymphocytes Relative: 13 % (ref 12–46)
MCH: 34.2 pg — ABNORMAL HIGH (ref 26.0–34.0)
MCHC: 34.6 g/dL (ref 30.0–36.0)
MCV: 98.8 fL (ref 78.0–100.0)
Monocytes Absolute: 0.9 10*3/uL (ref 0.1–1.0)
Monocytes Relative: 8 % (ref 3–12)
NEUTROS ABS: 8.9 10*3/uL — AB (ref 1.7–7.7)
Neutrophils Relative %: 77 % (ref 43–77)
Platelets: 95 10*3/uL — ABNORMAL LOW (ref 150–400)
RBC: 4.12 MIL/uL (ref 3.87–5.11)
RDW: 16.5 % — ABNORMAL HIGH (ref 11.5–15.5)
WBC: 11.6 10*3/uL — ABNORMAL HIGH (ref 4.0–10.5)

## 2014-06-24 LAB — COMPREHENSIVE METABOLIC PANEL
ALT: 108 U/L — AB (ref 0–35)
AST: 111 U/L — ABNORMAL HIGH (ref 0–37)
Albumin: 2 g/dL — ABNORMAL LOW (ref 3.5–5.2)
Alkaline Phosphatase: 547 U/L — ABNORMAL HIGH (ref 39–117)
BUN: 35 mg/dL — AB (ref 6–23)
CALCIUM: 8 mg/dL — AB (ref 8.4–10.5)
CO2: 20 meq/L (ref 19–32)
CREATININE: 1.03 mg/dL (ref 0.50–1.10)
Chloride: 109 mEq/L (ref 96–112)
Glucose, Bld: 79 mg/dL (ref 70–99)
Potassium: 3.5 mEq/L (ref 3.5–5.3)
Sodium: 137 mEq/L (ref 135–145)
Total Bilirubin: 2.7 mg/dL — ABNORMAL HIGH (ref 0.2–1.2)
Total Protein: 5.1 g/dL — ABNORMAL LOW (ref 6.0–8.3)

## 2014-06-24 LAB — PREALBUMIN: PREALBUMIN: 9.1 mg/dL — AB (ref 17.0–34.0)

## 2014-06-28 ENCOUNTER — Non-Acute Institutional Stay (SKILLED_NURSING_FACILITY): Payer: Medicare Other | Admitting: Internal Medicine

## 2014-06-28 DIAGNOSIS — E43 Unspecified severe protein-calorie malnutrition: Secondary | ICD-10-CM

## 2014-06-28 DIAGNOSIS — F05 Delirium due to known physiological condition: Secondary | ICD-10-CM

## 2014-06-28 DIAGNOSIS — R410 Disorientation, unspecified: Secondary | ICD-10-CM

## 2014-06-28 DIAGNOSIS — F32A Depression, unspecified: Secondary | ICD-10-CM

## 2014-06-28 DIAGNOSIS — C220 Liver cell carcinoma: Secondary | ICD-10-CM

## 2014-06-28 DIAGNOSIS — F329 Major depressive disorder, single episode, unspecified: Secondary | ICD-10-CM

## 2014-06-28 NOTE — Progress Notes (Signed)
Patient ID: [name], female   DOB: July 18, 1928, 80 y.o.   MRN: [id number]   Location:  [facility] Code Status: DNR  Allergies  Allergen Reactions  . Avelox [Moxifloxacin Hcl In Nacl] Anaphylaxis  . Statins Other (See Comments)    Liver function elevations  . Sulfa Antibiotics Hives and Itching  . Ace Inhibitors     Unknown  . Doxycycline     Unknown  . Infed [Iron Dextran] Other (See Comments)    IV IRON, Unknown  . Infergen [Interferon Alfacon-1]   . Vancomycin Other (See Comments)    Flushing, erythema to neck and face, tolerated vanc at lower infusion rate  . Ciprofloxacin Other (See Comments)    Complains of sore mouth  . Fenofibrate Other (See Comments)    constipation    Chief Complaint  Patient presents with  . Acute Visit    depression, her daughter wrote a letter requesting her remeron be stopped b/c she believes it was the cause of her hallucinations and inappropriate behavior at the hospital    HPI: Patient is a 80 y.o. white female seenin her snf room today due to increased pain, confusion and decline.  She continues to get recurrent ascites from her NASH and hepatocellular ca suspected.  Her daughter believes the remeron is the cuase of her confusion.    Review of Systems:  Review of Systems  Constitutional: Positive for malaise/fatigue.  HENT: Negative for congestion.   Eyes: Negative for blurred vision.  Respiratory: Negative for shortness of breath.   Cardiovascular: Negative for chest pain.  Gastrointestinal: Positive for abdominal pain.       Ascites  Musculoskeletal: Positive for myalgias. Negative for falls.  Skin: Negative for rash.  Neurological: Positive for weakness. Negative for dizziness.  Psychiatric/Behavioral: Positive for depression, hallucinations and memory loss.     Past Medical History  Diagnosis Date  . Anemia, iron deficiency 08/07/2011  . Angiodysplasia of intestinal tract 08/07/2011  . Hypertension   .  Arthritis   . Diverticulitis   . Phlebitis   . Macular degeneration   . TIA (transient ischemic attack)   . MI (myocardial infarction)   . Hiatal hernia   . AVM (arteriovenous malformation) of colon with hemorrhage   . Varicose veins of legs   . Cataract   . GERD (gastroesophageal reflux disease)   . DJD (degenerative joint disease)   . Diabetes mellitus     borderline  . History of blood transfusion   . PONV (postoperative nausea and vomiting)   . Edema leg   . Fatty liver   . Vitamin D deficiency   . hepatocellar ca dx'd 08/2012    Past Surgical History  Procedure Laterality Date  . 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 Abdominal hysterectomy    . Hemorrhoid surgery    . Esophagogastroduodenoscopy  08/30/2012    Procedure: ESOPHAGOGASTRODUODENOSCOPY (EGD);  Surgeon: Jeryl Columbia, MD;  Location: [facility];  Service: Endoscopy;  Laterality: N/A;  . Radiofrequency ablation liver tumor Right 01/2013    HFA ablation right liver lesion  . Colonoscopy  06/2009    Dr. Watt Climes, due 5 years  . Endoscopic vein laser treatment Left 2014    Dr. Kellie Simmering    Social History:   reports that she quit smoking about 24 years ago. Her smoking use included Cigarettes. She started smoking about 63 years ago. She has a 200 pack-year smoking history. She has never  used smokeless tobacco. She reports that she does not drink alcohol or use illicit drugs.  Family History  Problem Relation Age of Onset  . Colon cancer Sister   . Coronary artery disease Father   . Heart disease Father     Medications: Patient's Medications  New Prescriptions   ALPRAZOLAM (XANAX) 0.25 MG TABLET    Take 1 tablet (0.25 mg total) by mouth once.  Previous Medications   ACETAMINOPHEN (TYLENOL) 325 MG TABLET    Take 650 mg by mouth every 8 (eight) hours.   CHOLECALCIFEROL (VITAMIN D) 1000 UNITS TABLET    Take 1,000 Units by mouth daily.   FEEDING SUPPLEMENT, ENSURE COMPLETE, (ENSURE COMPLETE) LIQD     Take 237 mLs by mouth 3 (three) times daily between meals.   MULTIPLE VITAMIN (MULTIVITAMIN WITH MINERALS) TABS TABLET    Take 1 tablet by mouth daily.   NYSTATIN (MYCOSTATIN) 100000 UNIT/ML SUSPENSION    Take 5 mLs (500,000 Units total) by mouth 4 (four) times daily.   OXYCODONE (OXY IR/ROXICODONE) 5 MG IMMEDIATE RELEASE TABLET    Take 0.5 tablets (2.5 mg total) by mouth every 6 (six) hours as needed for moderate pain.   PANTOPRAZOLE (PROTONIX) 40 MG TABLET    Take 40 mg by mouth 2 (two) times daily.   QUETIAPINE (SEROQUEL) 12.5 MG TABS TABLET    Take 0.5 tablets (12.5 mg total) by mouth 2 (two) times daily as needed (Agitation).   SACCHAROMYCES BOULARDII (FLORASTOR) 250 MG CAPSULE    Take 1 capsule (250 mg total) by mouth 2 (two) times daily.   SODIUM CHLORIDE FLUSH (NORMAL SALINE FLUSH) 0.9 % SOLN    Inject 10 mLs into the vein every 8 (eight) hours. For uti, flush both ports   VANCOMYCIN (VANCOCIN) 50 MG/ML ORAL SOLUTION    Take 125 mg PO QID for 2 weeks, followed by 125 mg PO BID for two weeks, followed 125 mg mg PO q daily for 1 week, followed by 125 mg QOD for 1 week then stop  Modified Medications   No medications on file  Discontinued Medications   No medications on file     Physical Exam: Filed Vitals:   06/28/14 1751  BP: 129/78  Pulse: 81  Temp: 98.1 F (36.7 C)  Resp: 19  Height: 5\' 5"  (1.651 m)  Weight: 164 lb (74.39 kg)  SpO2: 96%   Physical Exam  Constitutional:  Chronically ill appearing white female resting in bed  Cardiovascular: Normal rate, regular rhythm, normal heart sounds and intact distal pulses.   Pulmonary/Chest: Effort normal and breath sounds normal. She has no rales.  Abdominal: Soft. Bowel sounds are normal. She exhibits distension. There is tenderness.  Ascites present  Neurological: She is alert.  Oriented at present and providing appropriate feedback and answers to questions  Skin: Skin is warm and dry.  Psychiatric:  tearful     Labs  reviewed: Basic Metabolic Panel:  Recent Labs  05/12/14 0630  05/14/14 0611  05/20/14 0728  05/23/14 0535 05/26/14 0640  06/15/14 0045  06/20/14 0748 06/24/14 0700 07/11/14 1007  NA 136*  < > 137  < > 137  < > 137 137  < > 136*  < > 140 137 131  K 4.4  < > 4.6  < > 4.8  < > 4.7 3.9  < > 4.2  < > 4.0 3.5 3.3  CL 105  < > 106  < > 106  < > 106 105  < >  103  < > 106 109 96*  CO2 19  < > 19  < > 22  < > 21 20  < > 16*  < > 21 20 23   GLUCOSE 87  < > 80  < > 95  < > 111* 98  < > 135*  < > 101* 79 197*  BUN 42*  < > 35*  < > 38*  < > 42* 36*  < > 66*  < > 45* 35* 37*  CREATININE 1.14*  < > 1.14*  < > 1.15*  < > 1.14* 1.10  < > 1.26*  < > 1.07 1.03 1.4*  CALCIUM 9.3  < > 9.2  < > 9.0  < > 9.1 8.5  < > 9.1  < > 7.7* 8.0* 8.1  MG 2.0  --   --   --   --   --  2.1 2.2  --   --   --   --   --   --   PHOS 2.9  --   --   --  2.9  --  3.1 2.9  --   --   --   --   --   --   TSH 2.760  --  2.180  --   --   --   --   --   --  2.180  --   --   --   --   < > = values in this interval not displayed. Liver Function Tests:  Recent Labs  06/16/14 0433 06/18/14 0425 06/24/14 0700 07/11/14 1007  AST 76* 121* 111* 100*  ALT 74* 106* 108* 68*  ALKPHOS 229* 294* 547* 457*  BILITOT 1.3* 1.6* 2.7* 1.50  PROT 5.8* 5.8* 5.1* 6.4  ALBUMIN 2.0* 2.1* 2.0*  --     Recent Labs  04/25/14 1706  LIPASE 53    Recent Labs  06/14/14 1727  AMMONIA 55   CBC:  Recent Labs  06/14/14 1725  06/19/14 0532 06/24/14 0700 07/11/14 1007  WBC 13.5*  < > 11.7* 11.6* 9.0  NEUTROABS 12.2*  --   --  8.9* 5.8  HGB 16.8*  < > 14.6 14.1 14.3  HCT 48.6*  < > 42.7 40.7 39.6  MCV 98.6  < > 95.5 98.8 99  PLT 126*  < > 109* 95* 195  < > = values in this interval not displayed. Lipid Panel:  Recent Labs  12/16/13 1001  05/02/14 0600 05/09/14 0552 05/12/14 0630  CHOL 139  --   --   --  68  HDL 41  --   --   --  16*  LDLCALC 79  --   --   --  42  TRIG 94  < > 46 51 52  CHOLHDL 3.4  --   --   --  4.3  < > =  values in this interval not displayed. Lab Results  Component Value Date   HGBA1C 5.7* 05/12/2014   Assessment/Plan 1. Hepatoma -pt seems to understand her poor prognosis herself and requested hospice care today -increase pain medication  2. Protein-calorie malnutrition, severe -cont supplements and d/c remeron per her daughter's request  3. Depression -d/c remeron, try zoloft  4. Subacute delirium -likely due to hepatic encephalopathy and end stages of disease, xanax -will try stopping remeron per her daughter's request to see if she does improve in terms of hallucinations -cont xanax to help nerves and comfort at this point  Philamena Kramar L. Nicky Kras, D.O. [facility] [address], [place] [address] Cell Phone (Mon-Fri 8am-5pm):  [phone] On Call:  [phone] & follow prompts after 5pm & weekends Office Phone:  [phone] Office Fax:  [phone]

## 2014-07-01 ENCOUNTER — Non-Acute Institutional Stay (SKILLED_NURSING_FACILITY): Payer: Medicare Other | Admitting: Internal Medicine

## 2014-07-01 DIAGNOSIS — F329 Major depressive disorder, single episode, unspecified: Secondary | ICD-10-CM

## 2014-07-01 DIAGNOSIS — Z7189 Other specified counseling: Secondary | ICD-10-CM

## 2014-07-01 DIAGNOSIS — F0393 Unspecified dementia, unspecified severity, with mood disturbance: Secondary | ICD-10-CM

## 2014-07-01 DIAGNOSIS — R5381 Other malaise: Secondary | ICD-10-CM

## 2014-07-01 DIAGNOSIS — C220 Liver cell carcinoma: Secondary | ICD-10-CM

## 2014-07-01 DIAGNOSIS — F0280 Dementia in other diseases classified elsewhere without behavioral disturbance: Secondary | ICD-10-CM

## 2014-07-03 DIAGNOSIS — F329 Major depressive disorder, single episode, unspecified: Secondary | ICD-10-CM

## 2014-07-03 DIAGNOSIS — F0393 Unspecified dementia, unspecified severity, with mood disturbance: Secondary | ICD-10-CM | POA: Insufficient documentation

## 2014-07-03 DIAGNOSIS — F0280 Dementia in other diseases classified elsewhere without behavioral disturbance: Secondary | ICD-10-CM | POA: Insufficient documentation

## 2014-07-03 DIAGNOSIS — C220 Liver cell carcinoma: Secondary | ICD-10-CM | POA: Insufficient documentation

## 2014-07-03 NOTE — Progress Notes (Signed)
Patient ID: [name], female   DOB: 08/19/1928, 80 y.o.   MRN: [id number]    Facility: [facility]  Chief Complaint  Patient presents with  . Acute Visit    family meeting to review goals of care and address family concerns   Allergies  Allergen Reactions  . Avelox [Moxifloxacin Hcl In Nacl] Anaphylaxis  . Statins Other (See Comments)    Liver function elevations  . Sulfa Antibiotics Hives and Itching  . Ace Inhibitors     Unknown  . Doxycycline     Unknown  . Infed [Iron Dextran] Other (See Comments)    IV IRON, Unknown  . Infergen [Interferon Alfacon-1]   . Vancomycin Other (See Comments)    Flushing, erythema to neck and face, tolerated vanc at lower infusion rate  . Ciprofloxacin Other (See Comments)    Complains of sore mouth  . Fenofibrate Other (See Comments)    constipation   HPI 80 y/o female patient is here for STR. She has hx of hepatocellular carcinoma s/p radioablation, NASH, iron def anemia, portal HTN and now is s/p pleurx to her chest for recurrent pleural effusion. She is weak and tired and sitting on her chair. She would like to review her care plan today. Her 2 daughters, grand daughter, social worker Dimple Nanas, physical therapist, nursing supervisor are all present in the room. Patient is concerned because she was recently told by a provider that her life span was between few weeks to a month and that hospice care needs to be involved. Patient has been tearful after that and upset. This has upset the family as well. Patient mentions she is working with therapy team. Of note patient has dementia.  Family would like to know about her prognosis and plan of care. Patient would like to go home with therapy if possible.  ROS Denies chest pain or dyspnea Energy level has somewhat improved and has been working better with therapy this week As per family, mood is slightly better today but until this am patient has been tearful and  worried Appetite slightly improved, denies nausea or vomiting No further loose stools  Past Medical History  Diagnosis Date  . Anemia, iron deficiency 08/07/2011  . Angiodysplasia of intestinal tract 08/07/2011  . Hypertension   . Arthritis   . Diverticulitis   . Phlebitis   . Macular degeneration   . TIA (transient ischemic attack)   . MI (myocardial infarction)   . Hiatal hernia   . AVM (arteriovenous malformation) of colon with hemorrhage   . Varicose veins of legs   . Cataract   . GERD (gastroesophageal reflux disease)   . DJD (degenerative joint disease)   . Diabetes mellitus     borderline  . History of blood transfusion   . PONV (postoperative nausea and vomiting)   . Edema leg   . Fatty liver   . Vitamin D deficiency   . hepatocellar ca dx'd 08/2012   Medication reviewed. See [facility]  Physical exam Vital signs stable, afebrile  General- elderly frail and ill appearing patient sitting up comfortably in chair Respiratory- decreased breath sounds at bases, no use of accessory muscles, no wheeze or rhonchi, right pleurx in place cvs- normal s1,s2, rr Neuro- alert and oriented to person and place Musculoskeletal- able to move all 4 extremities  Imaging 06/14/14 Ct abdomen/pelvis  IMPRESSION: 1. Colonic wall thickening which could be from mild colitis or venous congestion. 2. Cirrhosis with portal hypertension and  treated hepatoma. Newly seen 1 cm nodule in segment 6 on delayed phase. Recommend nonemergent liver MRI to evaluate for new hepatocellular carcinoma. 3. Chronic findings described above.    Assessment/plan  Hepatocellular carcinoma S/p RFA in past. Has a new 1 cm ndoule seen in ct abdo/pelvis. Will get oncology follow up to assess for new [place], f/u with MRI. Patient and family made aware of this finding and they would like oncology f/u set up  Generalized weakness Continue to work with PT and OT as tolerated, patient making progress, if progress made  as desired, pt will be discharged home with home therapy  Goals of care Explained that patient has protein malnutrition, NASH, pressure ulcer and physical deconditioning and now new lesion in the liver which could be HCC. Family and patient understands this and would like to see her oncologist for further discussion on prognosis, treatment option for the new lesion if it is cancerous and then decide further on goals of care.  Depression with dementia Increase escitalopram to 40 mg daily. Progressive decline of her memory as per family. Will have her prn seroquel changed to 12.5 mg qhs scheduled to help with delirium

## 2014-07-04 ENCOUNTER — Telehealth: Payer: Self-pay | Admitting: Hematology & Oncology

## 2014-07-04 NOTE — Telephone Encounter (Signed)
Message from [place] at nursing home to schedule pt. I called back she had left for the day. I left message with Myriam Jacobson to have her call.

## 2014-07-11 ENCOUNTER — Encounter: Payer: Self-pay | Admitting: Family

## 2014-07-11 ENCOUNTER — Ambulatory Visit (HOSPITAL_BASED_OUTPATIENT_CLINIC_OR_DEPARTMENT_OTHER): Payer: Medicare Other | Admitting: Family

## 2014-07-11 ENCOUNTER — Other Ambulatory Visit (HOSPITAL_BASED_OUTPATIENT_CLINIC_OR_DEPARTMENT_OTHER): Payer: Medicare Other | Admitting: Lab

## 2014-07-11 VITALS — BP 98/53 | HR 97 | Temp 97.7°F | Resp 18 | Ht 65.0 in | Wt 154.0 lb

## 2014-07-11 DIAGNOSIS — D509 Iron deficiency anemia, unspecified: Secondary | ICD-10-CM

## 2014-07-11 DIAGNOSIS — C220 Liver cell carcinoma: Secondary | ICD-10-CM

## 2014-07-11 DIAGNOSIS — K8689 Other specified diseases of pancreas: Secondary | ICD-10-CM

## 2014-07-11 DIAGNOSIS — R197 Diarrhea, unspecified: Secondary | ICD-10-CM

## 2014-07-11 DIAGNOSIS — D7581 Myelofibrosis: Secondary | ICD-10-CM

## 2014-07-11 DIAGNOSIS — K7581 Nonalcoholic steatohepatitis (NASH): Secondary | ICD-10-CM

## 2014-07-11 LAB — CMP (CANCER CENTER ONLY)
ALK PHOS: 457 U/L — AB (ref 26–84)
ALT(SGPT): 68 U/L — ABNORMAL HIGH (ref 10–47)
AST: 100 U/L — ABNORMAL HIGH (ref 11–38)
Albumin: 2.4 g/dL — ABNORMAL LOW (ref 3.3–5.5)
BUN: 37 mg/dL — AB (ref 7–22)
CO2: 23 meq/L (ref 18–33)
CREATININE: 1.4 mg/dL — AB (ref 0.6–1.2)
Calcium: 8.1 mg/dL (ref 8.0–10.3)
Chloride: 96 mEq/L — ABNORMAL LOW (ref 98–108)
GLUCOSE: 197 mg/dL — AB (ref 73–118)
Potassium: 3.3 mEq/L (ref 3.3–4.7)
SODIUM: 131 meq/L (ref 128–145)
TOTAL PROTEIN: 6.4 g/dL (ref 6.4–8.1)
Total Bilirubin: 1.5 mg/dl (ref 0.20–1.60)

## 2014-07-11 LAB — CBC WITH DIFFERENTIAL (CANCER CENTER ONLY)
BASO#: 0 10*3/uL (ref 0.0–0.2)
BASO%: 0.4 % (ref 0.0–2.0)
EOS%: 1.7 % (ref 0.0–7.0)
Eosinophils Absolute: 0.2 10*3/uL (ref 0.0–0.5)
HCT: 39.6 % (ref 34.8–46.6)
HEMOGLOBIN: 14.3 g/dL (ref 11.6–15.9)
LYMPH#: 2.1 10*3/uL (ref 0.9–3.3)
LYMPH%: 23.3 % (ref 14.0–48.0)
MCH: 35.8 pg — AB (ref 26.0–34.0)
MCHC: 36.1 g/dL — AB (ref 32.0–36.0)
MCV: 99 fL (ref 81–101)
MONO#: 0.9 10*3/uL (ref 0.1–0.9)
MONO%: 10.2 % (ref 0.0–13.0)
NEUT%: 64.4 % (ref 39.6–80.0)
NEUTROS ABS: 5.8 10*3/uL (ref 1.5–6.5)
PLATELETS: 195 10*3/uL (ref 145–400)
RBC: 3.99 10*6/uL (ref 3.70–5.32)
RDW: 14.9 % (ref 11.1–15.7)
WBC: 9 10*3/uL (ref 3.9–10.0)

## 2014-07-11 LAB — IRON AND TIBC CHCC
%SAT: 70 % — ABNORMAL HIGH (ref 21–57)
IRON: 133 ug/dL (ref 41–142)
TIBC: 189 ug/dL — ABNORMAL LOW (ref 236–444)
UIBC: 56 ug/dL — ABNORMAL LOW (ref 120–384)

## 2014-07-11 LAB — FERRITIN CHCC: Ferritin: 1242 ng/ml — ABNORMAL HIGH (ref 9–269)

## 2014-07-11 MED ORDER — ALPRAZOLAM 0.25 MG PO TABS: 0.2500 mg | ORAL_TABLET | Freq: Once | ORAL | Status: AC

## 2014-07-11 NOTE — Progress Notes (Signed)
[place] [facility]  Telephone:(336) [phone] Fax:(336) [phone]  ID: [name] OB: August 11, 1928 MR#: [id number] [id number] Patient Care Team: Unk Pinto, MD as PCP - General (Internal Medicine) Volanda Napoleon, MD as Attending Physician (Internal Medicine) Peter M Martinique, MD as Attending Physician (Cardiology) Jeryl Columbia, MD as Consulting Physician (Gastroenterology) Marybelle Killings, MD as Consulting Physician (Orthopedic Surgery)  DIAGNOSIS:  1. Localized hepatocellular carcinoma.  2. History of recurrent iron-deficiency anemia.  3. Nonalcoholic steatohepatitis.  INTERVAL HISTORY: [name] is here for a follow-up. She was hospitalized in August with ascites and C-diff. The Cdiff is resolved. She has a Pleurex catheter now that is drained twice a day. According to her family they are taking off around 1500 cc of fluid daily. [name] is very upset with her care at the long term care facility she is in. She is wanting to change to another facility. Her family is worried because she is asking for hospice or palliative care because she is so unhappy at the facility. She doesn't like having female nurses at night. This scares her.  Her Ct scan in September showed a new 1 cm nodule on her liver. This also has her very concerned. She denies fever, chills, n/v, cough, rash, headache, dizziness, chest pain, palpitations, constipation, diarrhea, blood in urine or stool. She does have pain when they are draining fluid off with the Pleurex catheter and she becomes SOB at times during this. She has had no swelling, tenderness, numbness or tingling in her extremities. She still does not have much of an appetite. She tries to drink plenty of fluids.   CURRENT TREATMENT: Observation  REVIEW OF SYSTEMS: All other 10 point review of systems is negative.   PAST MEDICAL HISTORY: Past Medical History  Diagnosis Date  . Anemia, iron deficiency 08/07/2011  . Angiodysplasia of intestinal tract  08/07/2011  . Hypertension   . Arthritis   . Diverticulitis   . Phlebitis   . Macular degeneration   . TIA (transient ischemic attack)   . MI (myocardial infarction)   . Hiatal hernia   . AVM (arteriovenous malformation) of colon with hemorrhage   . Varicose veins of legs   . Cataract   . GERD (gastroesophageal reflux disease)   . DJD (degenerative joint disease)   . Diabetes mellitus     borderline  . History of blood transfusion   . PONV (postoperative nausea and vomiting)   . Edema leg   . Fatty liver   . Vitamin D deficiency   . hepatocellar ca dx'd 08/2012   PAST SURGICAL HISTORY: Past Surgical History  Procedure Laterality Date  . 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 Abdominal hysterectomy    . Hemorrhoid surgery    . Esophagogastroduodenoscopy  08/30/2012    Procedure: ESOPHAGOGASTRODUODENOSCOPY (EGD);  Surgeon: Jeryl Columbia, MD;  Location: [facility];  Service: Endoscopy;  Laterality: N/A;  . Radiofrequency ablation liver tumor Right 01/2013    HFA ablation right liver lesion  . Colonoscopy  06/2009    Dr. Watt Climes, due 5 years  . Endoscopic vein laser treatment Left 2014    Dr. Kellie Simmering    FAMILY HISTORY Family History  Problem Relation Age of Onset  . Colon cancer Sister   . Coronary artery disease Father   . Heart disease Father    GYNECOLOGIC HISTORY:  No LMP recorded. Patient has had a hysterectomy.   SOCIAL HISTORY: History   Social History  .  Marital Status: Widowed    Spouse Name: N/A    Number of Children: 4  . Years of Education: N/A   Occupational History  . Retired Agricultural consultant    Social History Main Topics  . Smoking status: Former Smoker -- 5.00 packs/day for 40 years    Types: Cigarettes    Start date: 04/14/1951    Quit date: 07/23/1990  . Smokeless tobacco: Never Used     Comment: quit smoking 23 years ago  . Alcohol Use: No  . Drug Use: No  . Sexual Activity: No   Other Topics Concern  . Not on file   Social  History Narrative   Widowed.  Lives alone.  Normally able to ambulate without assistance.    ADVANCED DIRECTIVES:  <no information>  HEALTH MAINTENANCE: History  Substance Use Topics  . Smoking status: Former Smoker -- 5.00 packs/day for 40 years    Types: Cigarettes    Start date: 04/14/1951    Quit date: 07/23/1990  . Smokeless tobacco: Never Used     Comment: quit smoking 23 years ago  . Alcohol Use: No   Colonoscopy: PAP: Bone density: Lipid panel:  Allergies  Allergen Reactions  . Avelox [Moxifloxacin Hcl In Nacl] Anaphylaxis  . Statins Other (See Comments)    Liver function elevations  . Sulfa Antibiotics Hives and Itching  . Ace Inhibitors     Unknown  . Doxycycline     Unknown  . Infed [Iron Dextran] Other (See Comments)    IV IRON, Unknown  . Infergen [Interferon Alfacon-1]   . Vancomycin Other (See Comments)    Flushing, erythema to neck and face, tolerated vanc at lower infusion rate  . Ciprofloxacin Other (See Comments)    Complains of sore mouth  . Fenofibrate Other (See Comments)    constipation    Current Outpatient Prescriptions  Medication Sig Dispense Refill  . acetaminophen (TYLENOL) 325 MG tablet Take 650 mg by mouth every 8 (eight) hours.      . ALPRAZolam (XANAX) 0.25 MG tablet Take 1 tablet (0.25 mg total) by mouth once.  1 tablet  0  . cholecalciferol (VITAMIN D) 1000 UNITS tablet Take 1,000 Units by mouth daily.      . feeding supplement, ENSURE COMPLETE, (ENSURE COMPLETE) LIQD Take 237 mLs by mouth 3 (three) times daily between meals.  20 Bottle  1  . Multiple Vitamin (MULTIVITAMIN WITH MINERALS) TABS tablet Take 1 tablet by mouth daily.      Marland Kitchen nystatin (MYCOSTATIN) 100000 UNIT/ML suspension Take 5 mLs (500,000 Units total) by mouth 4 (four) times daily.  60 mL  0  . oxyCODONE (OXY IR/ROXICODONE) 5 MG immediate release tablet Take 0.5 tablets (2.5 mg total) by mouth every 6 (six) hours as needed for moderate pain.  30 tablet  0  .  pantoprazole (PROTONIX) 40 MG tablet Take 40 mg by mouth 2 (two) times daily.      . QUEtiapine (SEROQUEL) 12.5 mg TABS tablet Take 0.5 tablets (12.5 mg total) by mouth 2 (two) times daily as needed (Agitation).  60 tablet  0  . saccharomyces boulardii (FLORASTOR) 250 MG capsule Take 1 capsule (250 mg total) by mouth 2 (two) times daily.      . Sodium Chloride Flush (NORMAL SALINE FLUSH) 0.9 % SOLN Inject 10 mLs into the vein every 8 (eight) hours. For uti, flush both ports      . vancomycin (VANCOCIN) 50 mg/mL oral solution Take 125 mg PO QID for  2 weeks, followed by 125 mg PO BID for two weeks, followed 125 mg mg PO q daily for 1 week, followed by 125 mg QOD for 1 week then stop  1 mL  0  . [DISCONTINUED] fluvastatin XL (LESCOL XL) 80 MG 24 hr tablet Take 80 mg by mouth daily.         No current facility-administered medications for this visit.   OBJECTIVE: Filed Vitals:   07/11/14 1019  BP: 98/53  Pulse: 97  Temp: 97.7 F (36.5 C)  Resp: 18    Filed Weights   07/11/14 1019  Weight: 154 lb (69.854 kg)   ECOG FS:2 - Symptomatic, <50% confined to bed Ocular: Sclerae unicteric, pupils equal, round and reactive to light Ear-nose-throat: Oropharynx clear, dentition fair Lymphatic: No cervical or supraclavicular adenopathy Lungs no rales or rhonchi, good excursion bilaterally Heart regular rate and rhythm, no murmur appreciated Abd soft, positive bowel sounds, has pleurex cath on the right side and is tender in that area  MSK no focal spinal tenderness, no joint edema Neuro: non-focal, well-oriented, appropriate affect Breasts: Deferred  LAB RESULTS: CMP     Component Value Date/Time   NA 131 07/11/2014 1007   NA 137 06/24/2014 0700   K 3.3 07/11/2014 1007   K 3.5 06/24/2014 0700   CL 96* 07/11/2014 1007   CL 109 06/24/2014 0700   CO2 23 07/11/2014 1007   CO2 20 06/24/2014 0700   GLUCOSE 197* 07/11/2014 1007   GLUCOSE 79 06/24/2014 0700   BUN 37* 07/11/2014 1007   BUN 35*  06/24/2014 0700   CREATININE 1.4* 07/11/2014 1007   CREATININE 1.07 06/20/2014 0748   CALCIUM 8.1 07/11/2014 1007   CALCIUM 8.0* 06/24/2014 0700   PROT 6.4 07/11/2014 1007   PROT 5.1* 06/24/2014 0700   ALBUMIN 2.0* 06/24/2014 0700   AST 100* 07/11/2014 1007   AST 111* 06/24/2014 0700   ALT 68* 07/11/2014 1007   ALT 108* 06/24/2014 0700   ALKPHOS 457* 07/11/2014 1007   ALKPHOS 547* 06/24/2014 0700   BILITOT 1.50 07/11/2014 1007   BILITOT 2.7* 06/24/2014 0700   GFRNONAA 47* 06/20/2014 0748   GFRNONAA 58* 03/31/2014 1211   GFRAA 54* 06/20/2014 0748   GFRAA 67 03/31/2014 1211   No results found for this basename: SPEP, UPEP,  kappa and lambda light chains   Lab Results  Component Value Date   WBC 9.0 07/11/2014   NEUTROABS 5.8 07/11/2014   HGB 14.3 07/11/2014   HCT 39.6 07/11/2014   MCV 99 07/11/2014   PLT 195 07/11/2014   No results found for this basename: LABCA2   No components found with this basename: [id number]   No results found for this basename: INR,  in the last 168 hours  STUDIES: None  ASSESSMENT/PLAN: [name] is a 80 year old white female with history of recurrent iron deficiency anemia, NASH and localized hepatocellular carcinoma.  We discussed her CBC and CMP. We will wait and see what her iron studies show.  Her most recent CT scan in September showed a new 1 cm nodule on her liver. We will get an MRI this week to evaluate this. This will be open because the patient is claustrophobic. We will also have her take Xanax 0.25 mg an hour before her scan to help her anxiety.   I called and left a message with Abigail at [facility] and am waiting to hear back from her to discuss possibly moving [name] to another long term  care facility.  We will wait for her scan results before scheduling her follow-up.  She know to call here with any questions or concerns and to go to the ED in the event of an emergency. We can certainly see her at any time.    Eliezer Bottom, NP  07/11/2014 12:45 PM

## 2014-07-13 ENCOUNTER — Ambulatory Visit (HOSPITAL_COMMUNITY): Payer: Medicare Other

## 2014-07-14 ENCOUNTER — Encounter: Payer: Self-pay | Admitting: *Deleted

## 2014-07-14 NOTE — Progress Notes (Signed)
[place] Work  Clinical Social Work was referred by Engineer, mining through [employer] office for assessment of psychosocial needs.  Clinical Social Worker contacted patient's daughter and grand daughter via phone to offer support and assess for needs.  Per granddaughter, pt is not happy with the care at [facility] currently. CSW explained the care needs re. the pleurex cath and that most SNFs cannot manage this need. Daughter and granddaughter have not met with staff at the SNF to date to address concerns and wish pt was happier. CSW encouraged family to share concerns with facility, as they cannot fix the issue if they are not aware of the concern. [place] daughter asked about hospice and palliative care and if those could be received at a SNF. CSW explained the various levels of care and how medicare benefits work for each. CSW also explained that pt could receive hospice at the facility as well. The pleurex cath would be covered for both types of care. They plan to wait on next MRI as they are not sure of the current treatment plan at this time. [place] daughter was very appreciative of the call and information. They are aware to call CSW as needed.   Clinical Social Work interventions: Emotional support Resource/levels of care education  Loren Racer, [place] Worker Painter  [place] Phone: [phone] Fax: [phone]

## 2014-07-21 ENCOUNTER — Other Ambulatory Visit: Payer: Self-pay | Admitting: Family

## 2014-07-21 ENCOUNTER — Ambulatory Visit (HOSPITAL_COMMUNITY)
Admission: RE | Admit: 2014-07-21 | Discharge: 2014-07-21 | Disposition: A | Discharge status: Home / Self Care | Payer: Medicare Other | Source: Ambulatory Visit | Attending: Diagnostic Radiology | Admitting: Diagnostic Radiology

## 2014-07-21 DIAGNOSIS — C220 Liver cell carcinoma: Secondary | ICD-10-CM | POA: Insufficient documentation

## 2014-07-21 DIAGNOSIS — K7689 Other specified diseases of liver: Secondary | ICD-10-CM | POA: Diagnosis present

## 2014-07-21 DIAGNOSIS — K769 Liver disease, unspecified: Secondary | ICD-10-CM | POA: Diagnosis not present

## 2014-07-21 MED ORDER — GADOXETATE DISODIUM 0.25 MMOL/ML IV SOLN
7.0000 mL | Freq: Once | INTRAVENOUS | Status: AC | PRN
  Administered 2014-07-21: 7 mL via INTRAVENOUS

## 2014-07-22 ENCOUNTER — Ambulatory Visit: Payer: Medicare Other | Admitting: Internal Medicine

## 2014-07-22 DIAGNOSIS — Z79899 Other long term (current) drug therapy: Secondary | ICD-10-CM | POA: Insufficient documentation

## 2014-07-22 DIAGNOSIS — E782 Mixed hyperlipidemia: Secondary | ICD-10-CM | POA: Insufficient documentation

## 2014-07-22 DIAGNOSIS — R7303 Prediabetes: Secondary | ICD-10-CM | POA: Insufficient documentation

## 2014-07-22 NOTE — Progress Notes (Signed)
N O    S H O W    P A T I E N T  I N    N U R S I N G      H O M E

## 2014-07-25 ENCOUNTER — Other Ambulatory Visit: Payer: Self-pay | Admitting: Hematology & Oncology

## 2014-07-25 DIAGNOSIS — K5520 Angiodysplasia of colon without hemorrhage: Secondary | ICD-10-CM

## 2014-07-25 DIAGNOSIS — C220 Liver cell carcinoma: Secondary | ICD-10-CM

## 2014-08-16 ENCOUNTER — Other Ambulatory Visit: Payer: Medicare Other | Admitting: Lab

## 2014-08-16 ENCOUNTER — Ambulatory Visit: Payer: Medicare Other | Admitting: Hematology & Oncology

## 2014-08-22 ENCOUNTER — Encounter: Payer: Self-pay | Admitting: *Deleted

## 2014-08-22 ENCOUNTER — Telehealth: Payer: Self-pay | Admitting: *Deleted

## 2014-08-22 NOTE — Telephone Encounter (Signed)
Received phone call from daughter stating that patient had passed away. No further details received.

## 2014-08-23 DEATH — deceased

## 2014-10-28 ENCOUNTER — Other Ambulatory Visit: Payer: Self-pay | Admitting: Family

## 2014-10-30 ENCOUNTER — Encounter: Payer: Self-pay | Admitting: Internal Medicine

## 2015-01-24 IMAGING — CR DG CHEST 1V PORT
1 series · 1 of 1 positions shown · non-contrast
Comparison: 05/25/2014

CLINICAL DATA: Increased shortness of Breath

EXAM:
PORTABLE CHEST - 1 VIEW

[portable]
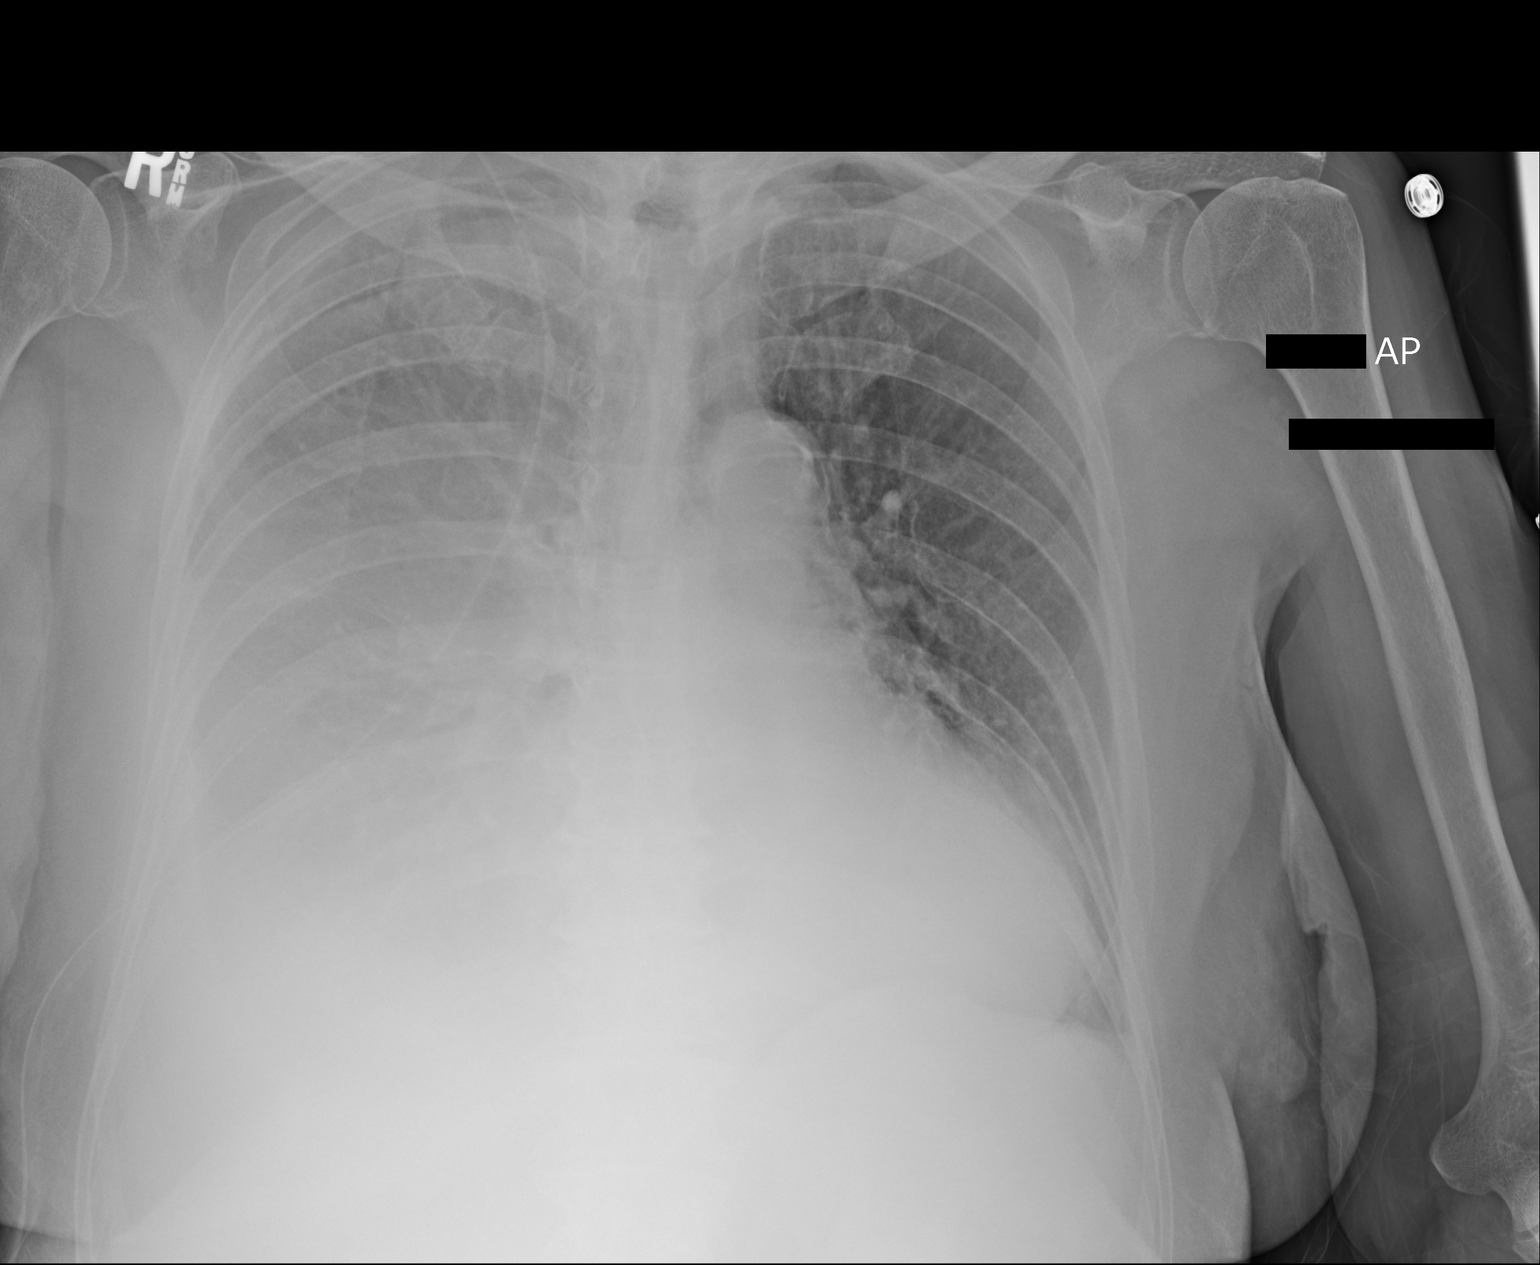

[1 of 1 positions shown; findings below may reference images not displayed]

FINDINGS: A PleurX catheter remains in the right hemi thorax. Increasing
density is noted consistent with an enlarging pleural effusion. The
cardiac shadow is stable. The left lung remains clear. No bony
abnormality is noted.
IMPRESSION: Increasing right-sided pleural effusion.

## 2015-01-25 IMAGING — CR DG CHEST 1V PORT
1 series · 1 of 1 positions shown · non-contrast
Comparison: 06/01/2014

CLINICAL DATA: Right pleural drain

EXAM:
PORTABLE CHEST - 1 VIEW

[AP]
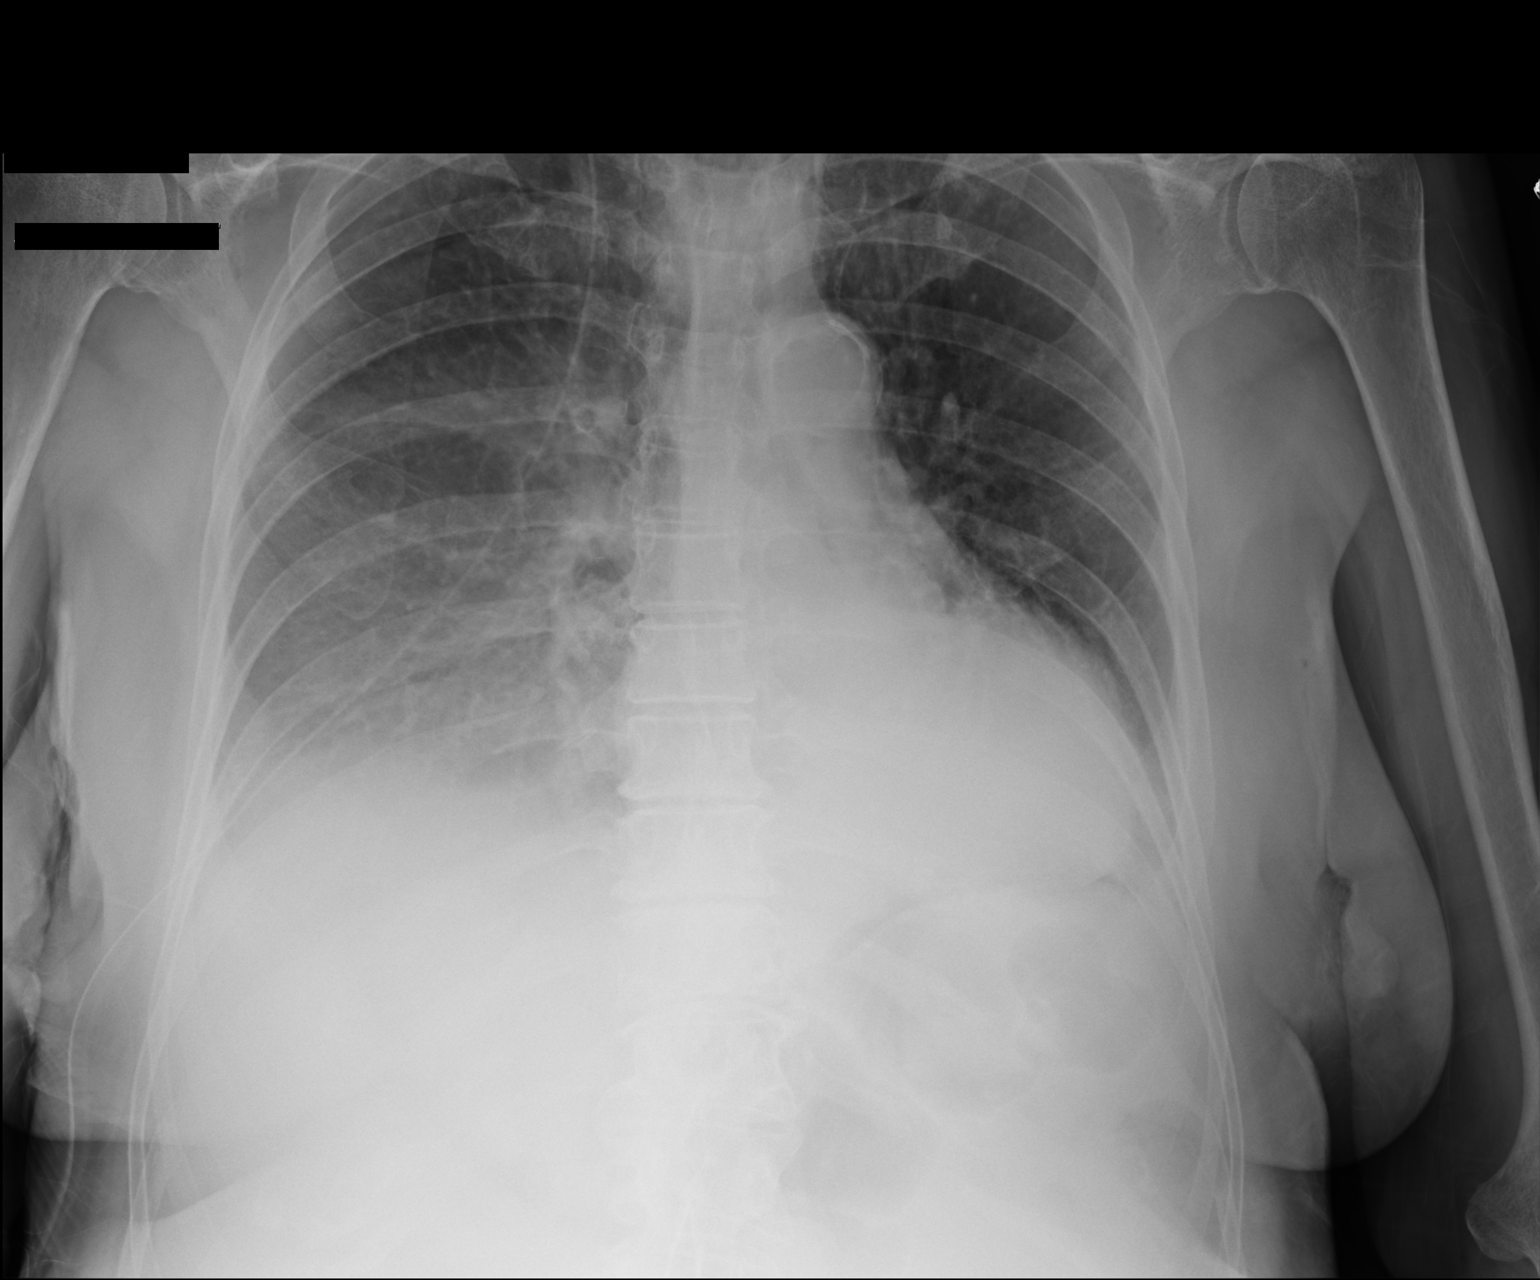

[1 of 1 positions shown; findings below may reference images not displayed]

FINDINGS: Interval decrease in right pleural effusion. Right Chest tube
remains in place extending into the lung apex. No pneumothorax.

Bibasilar atelectasis unchanged. Small left effusion is present and
a small to moderate right effusion remains.
IMPRESSION: Interval improvement in right pleural effusion.  No pneumothorax.

## 2015-01-30 IMAGING — CR DG CHEST 1V PORT
1 series · 1 of 1 positions shown · non-contrast
Comparison: 06/02/2014.

CLINICAL DATA: Improved cough.  Short of breath.  Low grade fever.

EXAM:
PORTABLE CHEST - 1 VIEW

[AP]
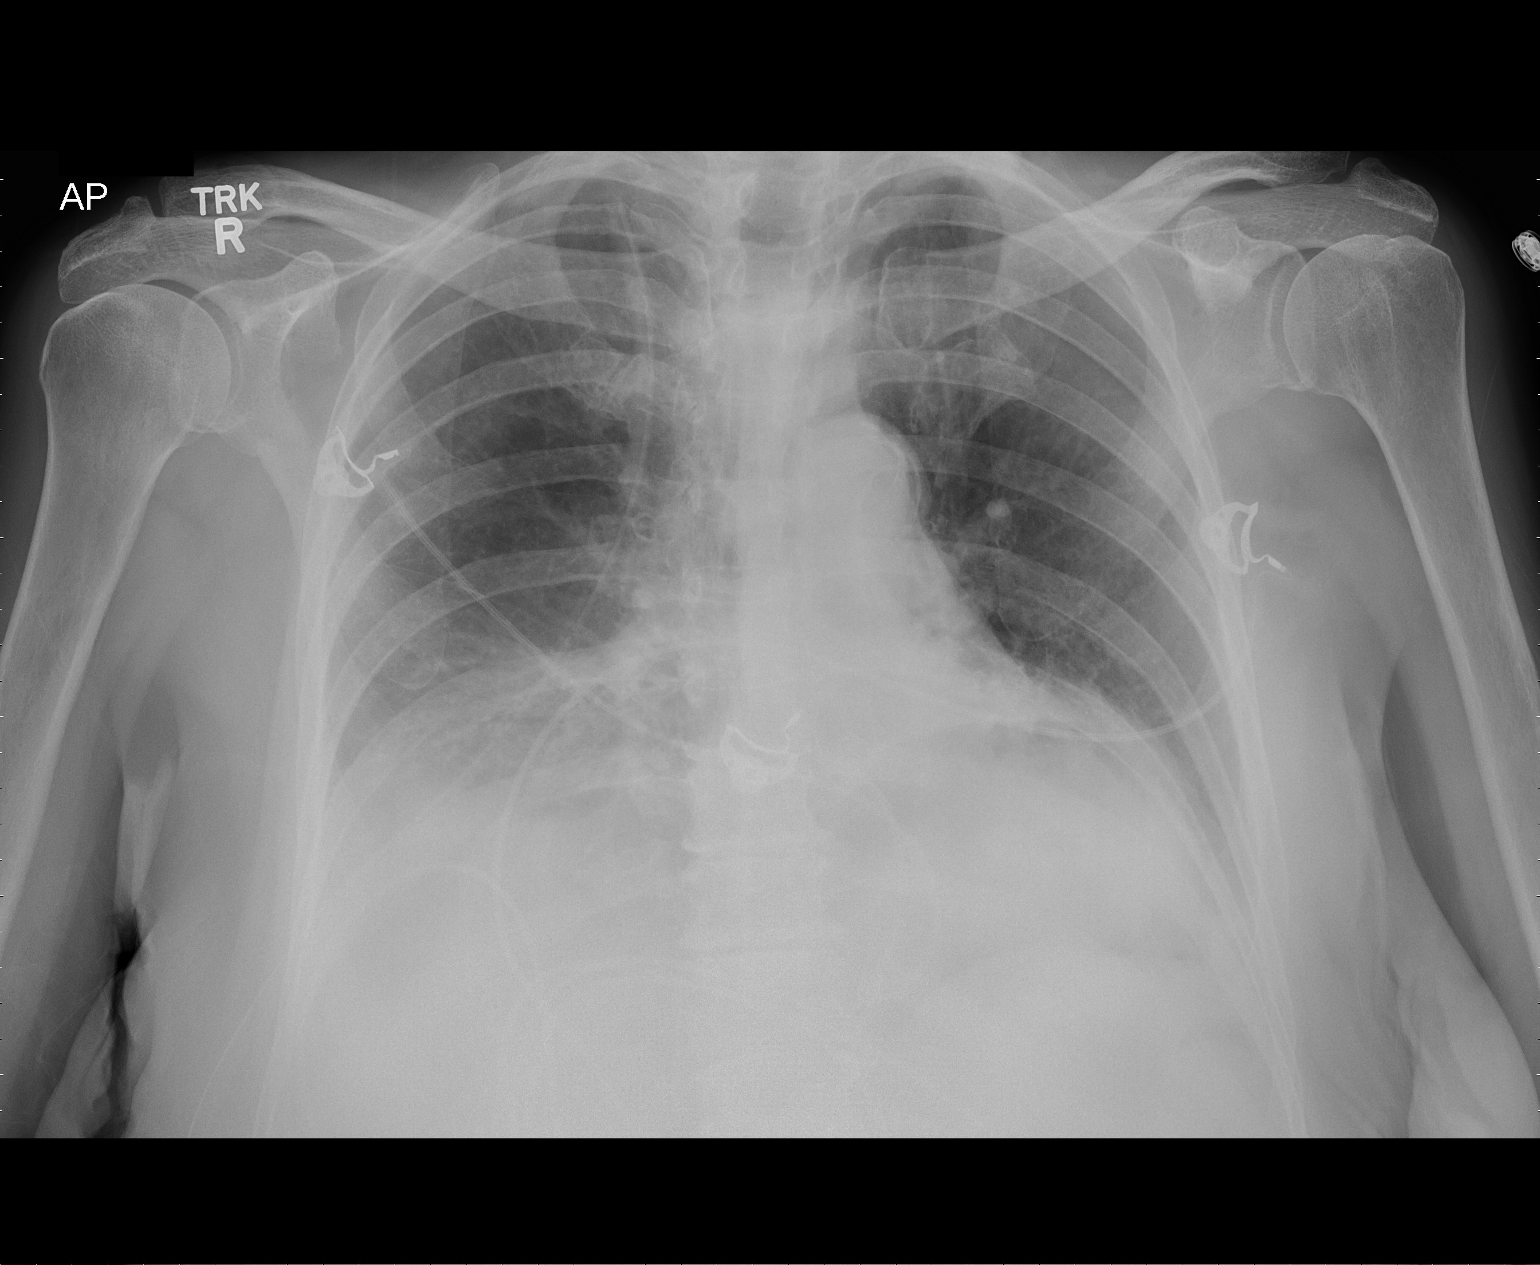

[1 of 1 positions shown; findings below may reference images not displayed]

FINDINGS: Stable right chest tube with its tip near the right apex. No
pneumothorax.

Hazy right base opacity obscures hemidiaphragm consistent with
pleural fluid. There is likely associated atelectasis. Infiltrate is
not excluded. Mild opacity at the left lung base is suggested but
not well evaluated due to the superimposed cardiac silhouette.
Atelectasis is suspected. Infiltrate is not excluded.

No pulmonary edema. No convincing left pleural effusion. Cardiac
silhouette is normal in size. No mediastinal or hilar masses.
IMPRESSION: 1. Stable chest tube on the right.  No pneumothorax.
2. Right pleural effusion. Probable bibasilar atelectasis. Lung base
pneumonia/infiltrate is possible.
3. No pulmonary edema.
4. Findings similar to the prior exam.

## 2015-02-06 IMAGING — CT CT ABD-PELV W/ CM
1 of 3 series · 13 of 32 positions shown, 18 images · IV contrast (OMNIPAQUE 300)
Comparison: 04/28/2014

CLINICAL DATA: Abdominal pain.

EXAM:
CT ABDOMEN AND PELVIS WITH CONTRAST
TECHNIQUE: Multidetector CT imaging of the abdomen and pelvis was performed
using the standard protocol following bolus administration of
intravenous contrast.
CONTRAST:  80mL OMNIPAQUE IOHEXOL 300 MG/ML  SOLN

[Series 2: abd/pel with · axial · 0.86mm/px · z∈[+942,+1367]mm · 13 of 97 slices shown, 18 images]
[im 6/97  soft-tissue]
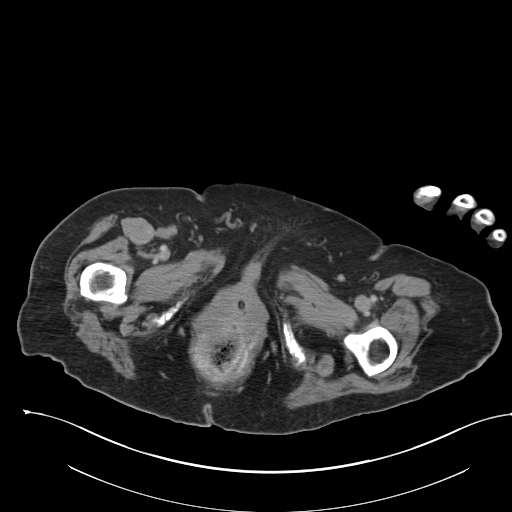
[im 6/97  bone]
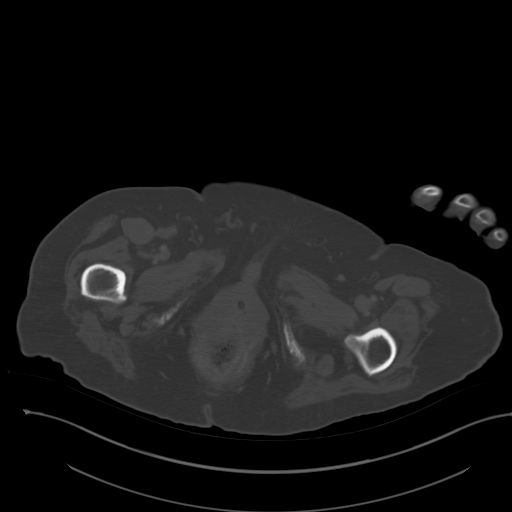
[im 17/97  soft-tissue]
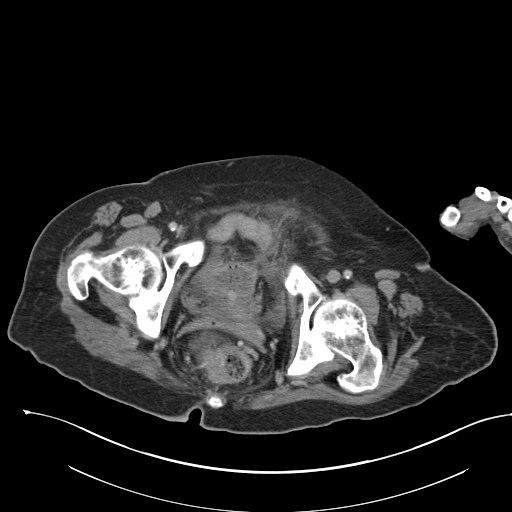
[im 22/97  soft-tissue]
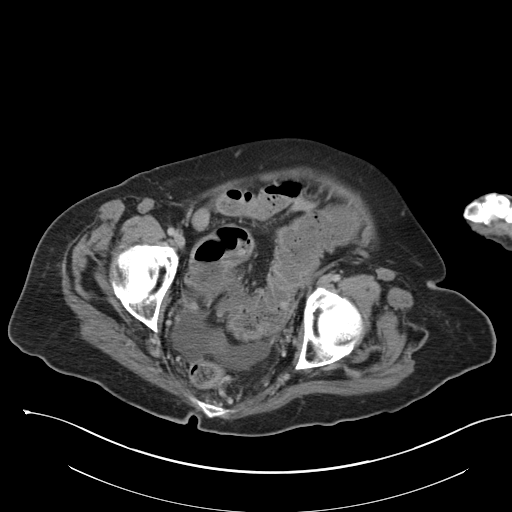
[im 27/97  soft-tissue]
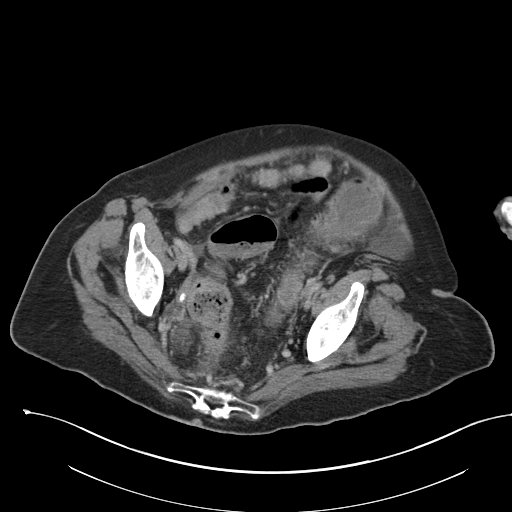
[im 38/97  soft-tissue]
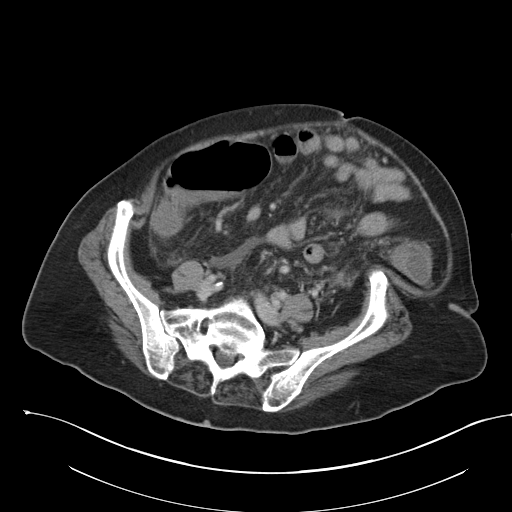
[im 43/97  soft-tissue]
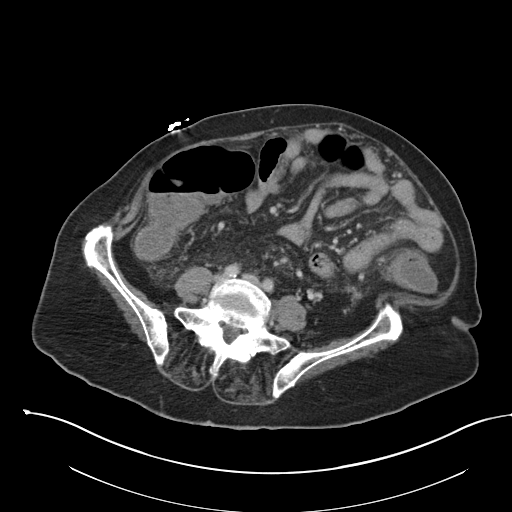
[im 54/97  soft-tissue]
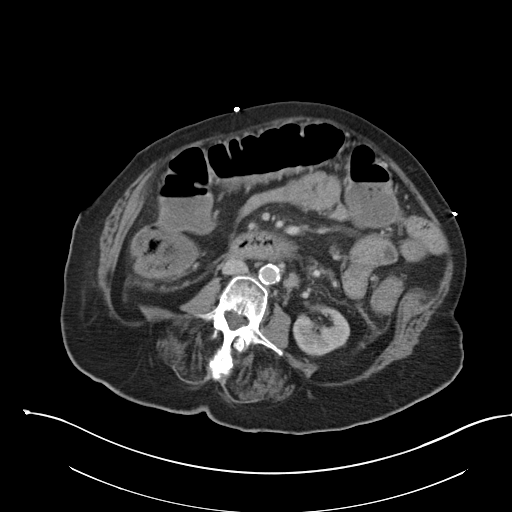
[im 59/97  soft-tissue]
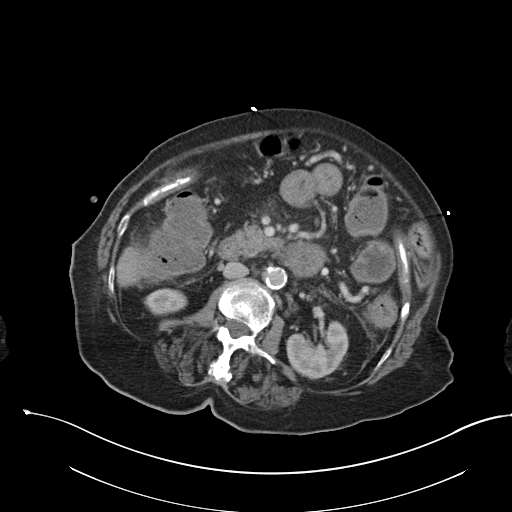
[im 70/97  soft-tissue]
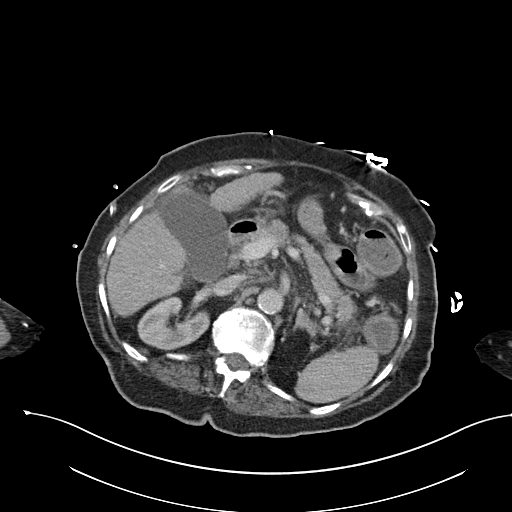
[im 70/97  bone]
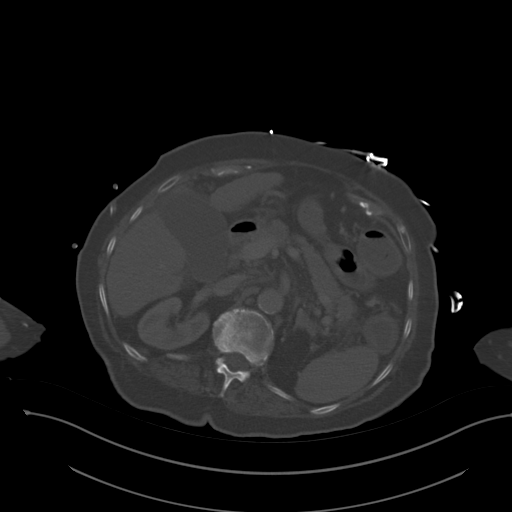
[im 75/97  soft-tissue]
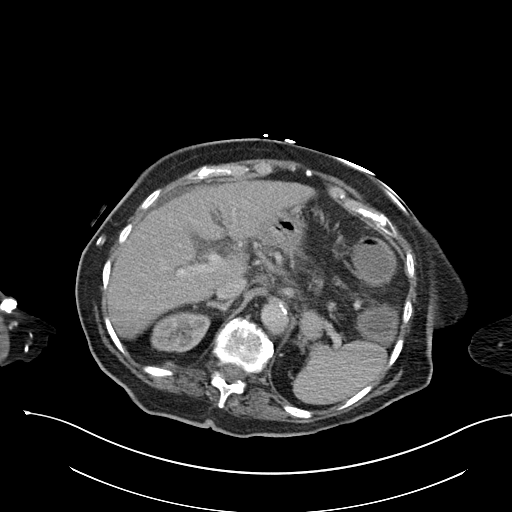
[im 75/97  lung]
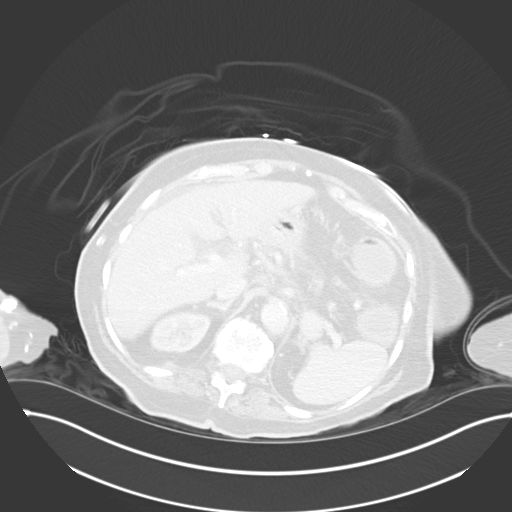
[im 81/97  soft-tissue]
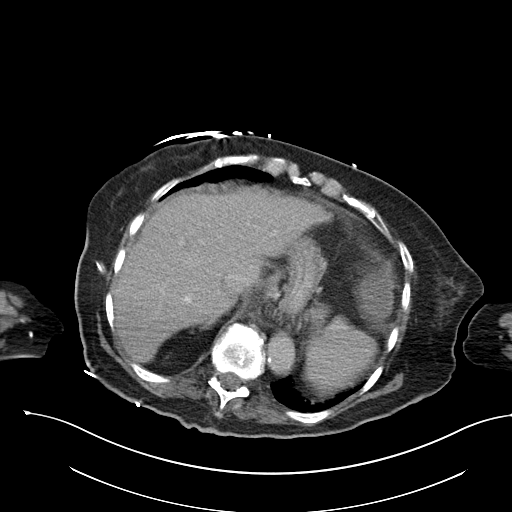
[im 81/97  lung]
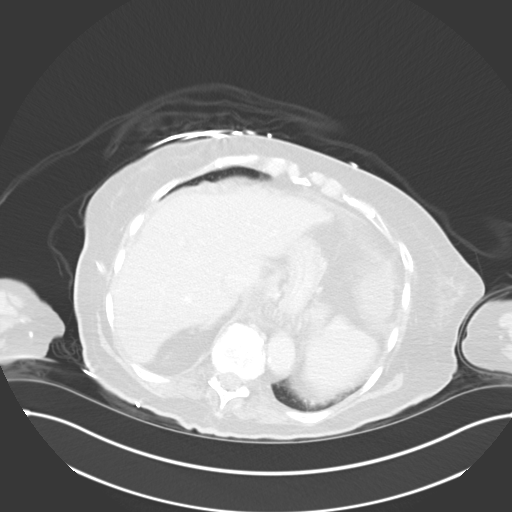
[im 86/97  lung]
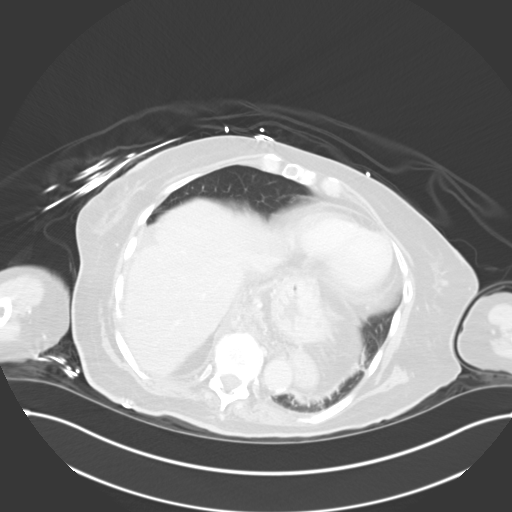
[im 91/97  soft-tissue]
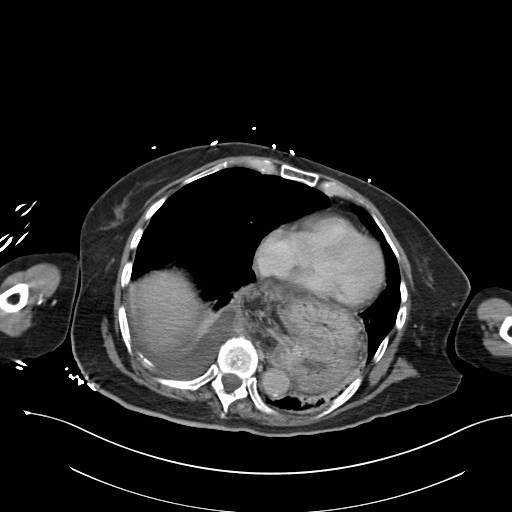
[im 91/97  lung]
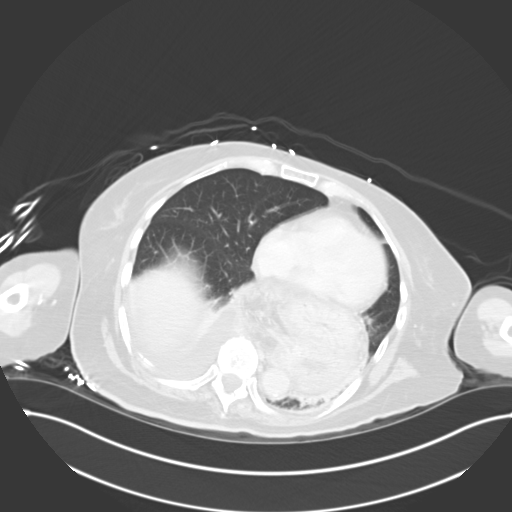

[13 of 32 positions shown; findings below may reference images not displayed]

FINDINGS: BODY WALL: Unremarkable.

LOWER CHEST: Large sliding-type hiatal hernia. Avid enhancement of
the lower esophagus compatible with varices. There is a small right
pleural effusion with pleural drain that appears well positioned.
Mild dependent atelectasis.

ABDOMEN/PELVIS:

Liver: Cirrhotic liver morphology. Stable appearance of subcapsular
mass in the inferior right liver measuring 25 mm in maximal
diameter, a hepatoma ablation site. On delayed phase in segment 6
there is a 10 mm area of low density (image 8) not seen on the
earlier phase. On the same image, more medially is a chronic 7 mm
low-density nodule on delayed phase

Biliary: Distended gallbladder. No evidence of active inflammation
or calcified stone.

Pancreas: Unremarkable.

Spleen: No splenomegaly.  Prominent splenule near the hilum.

Adrenals: Unremarkable.

Kidneys and ureters: Mild renal cortical thinning. No hydronephrosis
or inflammatory change.

Bladder: Decompressed by Foley catheter.

Reproductive: Hysterectomy.  Ovaries unremarkable for age.

Bowel: IMA varices with hemorrhoids. There is diffuse thickening of
of the distal colonic wall, best seen in the descending portion. No
bowel obstruction. No pericecal inflammation.

Retroperitoneum: Retroperitoneal edema which is chronic and likely
from portal hypertension. Chronic mild enlargement of lymph nodes
within the upper abdominal ligaments, likely reactive to the
patient's cirrhosis.

Peritoneum: Small volume ascites.

Vascular: No acute abnormality.

OSSEOUS: No acute abnormalities.
IMPRESSION: 1. Colonic wall thickening which could be from mild colitis or
venous congestion.
2. Cirrhosis with portal hypertension and treated hepatoma. Newly
seen 1 cm nodule in segment 6 on delayed phase. Recommend
nonemergent liver MRI to evaluate for new hepatocellular carcinoma.
3. Chronic findings described above.

## 2015-03-20 ENCOUNTER — Other Ambulatory Visit: Payer: Self-pay

## 2015-08-02 ENCOUNTER — Encounter: Payer: Self-pay | Admitting: Internal Medicine
